# Patient Record
Sex: Male | Born: 1935 | Race: White | Hispanic: No | Marital: Married | Smoking: Former smoker
Health system: Southern US, Community
[De-identification: ages and names within clinical notes are randomized; demographics above are authoritative.]

## PROBLEM LIST (undated history)

## (undated) DIAGNOSIS — M1990 Unspecified osteoarthritis, unspecified site: Secondary | ICD-10-CM

## (undated) DIAGNOSIS — I2119 ST elevation (STEMI) myocardial infarction involving other coronary artery of inferior wall: Secondary | ICD-10-CM

## (undated) DIAGNOSIS — E119 Type 2 diabetes mellitus without complications: Secondary | ICD-10-CM

## (undated) DIAGNOSIS — N179 Acute kidney failure, unspecified: Secondary | ICD-10-CM

## (undated) DIAGNOSIS — I714 Abdominal aortic aneurysm, without rupture, unspecified: Secondary | ICD-10-CM

## (undated) DIAGNOSIS — N200 Calculus of kidney: Secondary | ICD-10-CM

## (undated) DIAGNOSIS — J45909 Unspecified asthma, uncomplicated: Secondary | ICD-10-CM

## (undated) DIAGNOSIS — E785 Hyperlipidemia, unspecified: Secondary | ICD-10-CM

## (undated) DIAGNOSIS — F419 Anxiety disorder, unspecified: Secondary | ICD-10-CM

## (undated) DIAGNOSIS — I10 Essential (primary) hypertension: Secondary | ICD-10-CM

## (undated) DIAGNOSIS — N401 Enlarged prostate with lower urinary tract symptoms: Secondary | ICD-10-CM

## (undated) DIAGNOSIS — I619 Nontraumatic intracerebral hemorrhage, unspecified: Secondary | ICD-10-CM

## (undated) DIAGNOSIS — R351 Nocturia: Secondary | ICD-10-CM

## (undated) DIAGNOSIS — I209 Angina pectoris, unspecified: Secondary | ICD-10-CM

## (undated) DIAGNOSIS — I5022 Chronic systolic (congestive) heart failure: Secondary | ICD-10-CM

## (undated) DIAGNOSIS — I639 Cerebral infarction, unspecified: Secondary | ICD-10-CM

## (undated) DIAGNOSIS — R001 Bradycardia, unspecified: Secondary | ICD-10-CM

## (undated) DIAGNOSIS — I2510 Atherosclerotic heart disease of native coronary artery without angina pectoris: Secondary | ICD-10-CM

## (undated) DIAGNOSIS — H9190 Unspecified hearing loss, unspecified ear: Secondary | ICD-10-CM

## (undated) DIAGNOSIS — I480 Paroxysmal atrial fibrillation: Secondary | ICD-10-CM

## (undated) DIAGNOSIS — Z974 Presence of external hearing-aid: Secondary | ICD-10-CM

## (undated) HISTORY — PX: VASECTOMY: SHX75

## (undated) HISTORY — PX: LITHOTRIPSY: SUR834

---
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
## 1991-05-10 HISTORY — PX: INTERTROCHANTERIC HIP FRACTURE SURGERY: SHX681

## 2004-03-11 ENCOUNTER — Encounter: Payer: Self-pay | Admitting: Unknown Physician Specialty

## 2004-03-18 ENCOUNTER — Ambulatory Visit: Payer: Self-pay | Admitting: Internal Medicine

## 2004-04-09 ENCOUNTER — Encounter: Payer: Self-pay | Admitting: Unknown Physician Specialty

## 2004-04-09 ENCOUNTER — Ambulatory Visit: Payer: Self-pay | Admitting: Internal Medicine

## 2004-05-09 ENCOUNTER — Encounter: Payer: Self-pay | Admitting: Unknown Physician Specialty

## 2004-06-04 ENCOUNTER — Ambulatory Visit: Payer: Self-pay | Admitting: Internal Medicine

## 2004-06-09 ENCOUNTER — Encounter: Payer: Self-pay | Admitting: Unknown Physician Specialty

## 2004-07-10 ENCOUNTER — Encounter: Payer: Self-pay | Admitting: Unknown Physician Specialty

## 2004-08-07 ENCOUNTER — Encounter: Payer: Self-pay | Admitting: Unknown Physician Specialty

## 2004-09-07 ENCOUNTER — Encounter: Payer: Self-pay | Admitting: Unknown Physician Specialty

## 2004-10-07 ENCOUNTER — Encounter: Payer: Self-pay | Admitting: Unknown Physician Specialty

## 2004-11-07 ENCOUNTER — Encounter: Payer: Self-pay | Admitting: Unknown Physician Specialty

## 2004-12-07 ENCOUNTER — Encounter: Payer: Self-pay | Admitting: Unknown Physician Specialty

## 2005-01-07 ENCOUNTER — Encounter: Payer: Self-pay | Admitting: Unknown Physician Specialty

## 2005-02-07 ENCOUNTER — Encounter: Payer: Self-pay | Admitting: Unknown Physician Specialty

## 2005-03-09 ENCOUNTER — Encounter: Payer: Self-pay | Admitting: Unknown Physician Specialty

## 2005-04-09 ENCOUNTER — Encounter: Payer: Self-pay | Admitting: Unknown Physician Specialty

## 2005-05-09 ENCOUNTER — Encounter: Payer: Self-pay | Admitting: Unknown Physician Specialty

## 2005-05-12 ENCOUNTER — Other Ambulatory Visit: Payer: Self-pay

## 2005-05-12 ENCOUNTER — Inpatient Hospital Stay: Payer: Self-pay | Admitting: Cardiology

## 2005-05-29 ENCOUNTER — Ambulatory Visit: Payer: Self-pay | Admitting: Internal Medicine

## 2005-06-09 ENCOUNTER — Encounter: Payer: Self-pay | Admitting: Unknown Physician Specialty

## 2005-07-10 ENCOUNTER — Encounter: Payer: Self-pay | Admitting: Unknown Physician Specialty

## 2005-08-07 ENCOUNTER — Encounter: Payer: Self-pay | Admitting: Unknown Physician Specialty

## 2005-09-07 ENCOUNTER — Encounter: Payer: Self-pay | Admitting: Unknown Physician Specialty

## 2005-10-07 ENCOUNTER — Encounter: Payer: Self-pay | Admitting: Unknown Physician Specialty

## 2005-11-07 ENCOUNTER — Encounter: Payer: Self-pay | Admitting: Unknown Physician Specialty

## 2005-12-07 ENCOUNTER — Encounter: Payer: Self-pay | Admitting: Unknown Physician Specialty

## 2006-01-07 ENCOUNTER — Encounter: Payer: Self-pay | Admitting: Unknown Physician Specialty

## 2006-02-07 ENCOUNTER — Encounter: Payer: Self-pay | Admitting: Unknown Physician Specialty

## 2006-03-09 ENCOUNTER — Encounter: Payer: Self-pay | Admitting: Unknown Physician Specialty

## 2006-04-09 ENCOUNTER — Encounter: Payer: Self-pay | Admitting: Unknown Physician Specialty

## 2006-12-30 ENCOUNTER — Ambulatory Visit: Payer: Self-pay | Admitting: Internal Medicine

## 2007-06-10 HISTORY — PX: CORONARY ANGIOPLASTY WITH STENT PLACEMENT: SHX49

## 2007-07-22 ENCOUNTER — Ambulatory Visit: Payer: Self-pay | Admitting: Otolaryngology

## 2007-12-03 ENCOUNTER — Ambulatory Visit: Payer: Self-pay | Admitting: Cardiology

## 2008-04-18 ENCOUNTER — Ambulatory Visit: Payer: Self-pay | Admitting: Gastroenterology

## 2008-09-07 DIAGNOSIS — I619 Nontraumatic intracerebral hemorrhage, unspecified: Secondary | ICD-10-CM

## 2008-09-07 HISTORY — DX: Nontraumatic intracerebral hemorrhage, unspecified: I61.9

## 2008-09-22 ENCOUNTER — Ambulatory Visit: Payer: Self-pay | Admitting: Internal Medicine

## 2008-09-22 ENCOUNTER — Emergency Department: Payer: Self-pay | Admitting: Emergency Medicine

## 2008-12-07 ENCOUNTER — Ambulatory Visit: Payer: Self-pay | Admitting: Internal Medicine

## 2009-07-02 ENCOUNTER — Ambulatory Visit: Payer: Self-pay | Admitting: Cardiology

## 2010-06-17 ENCOUNTER — Ambulatory Visit: Payer: Self-pay | Admitting: Cardiology

## 2011-01-24 ENCOUNTER — Ambulatory Visit: Payer: Self-pay | Admitting: Urology

## 2011-02-13 ENCOUNTER — Ambulatory Visit: Payer: Self-pay | Admitting: Urology

## 2011-02-17 ENCOUNTER — Ambulatory Visit: Payer: Self-pay | Admitting: Urology

## 2011-06-10 HISTORY — PX: ABDOMINAL AORTIC ANEURYSM REPAIR: SHX42

## 2011-07-04 ENCOUNTER — Ambulatory Visit: Payer: Self-pay | Admitting: Cardiology

## 2011-10-08 DIAGNOSIS — I2119 ST elevation (STEMI) myocardial infarction involving other coronary artery of inferior wall: Secondary | ICD-10-CM

## 2011-10-08 HISTORY — PX: CARDIAC CATHETERIZATION: SHX172

## 2011-10-08 HISTORY — DX: ST elevation (STEMI) myocardial infarction involving other coronary artery of inferior wall: I21.19

## 2011-10-19 ENCOUNTER — Emergency Department (HOSPITAL_COMMUNITY): Payer: Medicare HMO

## 2011-10-19 ENCOUNTER — Emergency Department (HOSPITAL_COMMUNITY)
Admission: EM | Admit: 2011-10-19 | Discharge: 2011-10-20 | Payer: Medicare HMO | Attending: Emergency Medicine | Admitting: Emergency Medicine

## 2011-10-19 ENCOUNTER — Encounter (HOSPITAL_COMMUNITY): Payer: Self-pay | Admitting: Emergency Medicine

## 2011-10-19 DIAGNOSIS — R1013 Epigastric pain: Secondary | ICD-10-CM | POA: Insufficient documentation

## 2011-10-19 DIAGNOSIS — E785 Hyperlipidemia, unspecified: Secondary | ICD-10-CM | POA: Insufficient documentation

## 2011-10-19 DIAGNOSIS — Z8673 Personal history of transient ischemic attack (TIA), and cerebral infarction without residual deficits: Secondary | ICD-10-CM | POA: Insufficient documentation

## 2011-10-19 DIAGNOSIS — I10 Essential (primary) hypertension: Secondary | ICD-10-CM | POA: Insufficient documentation

## 2011-10-19 DIAGNOSIS — R0789 Other chest pain: Secondary | ICD-10-CM | POA: Insufficient documentation

## 2011-10-19 DIAGNOSIS — I619 Nontraumatic intracerebral hemorrhage, unspecified: Secondary | ICD-10-CM | POA: Insufficient documentation

## 2011-10-19 DIAGNOSIS — K3189 Other diseases of stomach and duodenum: Secondary | ICD-10-CM | POA: Insufficient documentation

## 2011-10-19 DIAGNOSIS — I200 Unstable angina: Secondary | ICD-10-CM | POA: Insufficient documentation

## 2011-10-19 DIAGNOSIS — E119 Type 2 diabetes mellitus without complications: Secondary | ICD-10-CM | POA: Insufficient documentation

## 2011-10-19 HISTORY — DX: Hyperlipidemia, unspecified: E78.5

## 2011-10-19 HISTORY — DX: Essential (primary) hypertension: I10

## 2011-10-19 HISTORY — DX: Nontraumatic intracerebral hemorrhage, unspecified: I61.9

## 2011-10-19 LAB — POCT I-STAT, CHEM 8
BUN: 22 mg/dL (ref 6–23)
Potassium: 3.9 mEq/L (ref 3.5–5.1)
Sodium: 143 mEq/L (ref 135–145)
TCO2: 22 mmol/L (ref 0–100)

## 2011-10-19 MED ORDER — HEPARIN BOLUS VIA INFUSION
4000.0000 [IU] | Freq: Once | INTRAVENOUS | Status: AC
  Administered 2011-10-20: 4000 [IU] via INTRAVENOUS

## 2011-10-19 MED ORDER — NITROGLYCERIN IN D5W 200-5 MCG/ML-% IV SOLN
2.0000 ug/min | Freq: Once | INTRAVENOUS | Status: AC
  Administered 2011-10-19: 5 ug/min via INTRAVENOUS
  Filled 2011-10-19: qty 250

## 2011-10-19 MED ORDER — HEPARIN (PORCINE) IN NACL 100-0.45 UNIT/ML-% IJ SOLN
1250.0000 [IU]/h | INTRAMUSCULAR | Status: DC
  Administered 2011-10-20: 1250 [IU]/h via INTRAVENOUS
  Filled 2011-10-19 (×2): qty 250

## 2011-10-19 NOTE — ED Notes (Addendum)
Patient complaining of mid-sternal chest pain since this morning/afternoon; rated pain 8/10 on the numerical pain scale; describes as pressure/tightness.  Reports shortness of breath; denies other associated symptoms.  Upon EMS arrival, patient report possible STEMI on 12-lead; faxed to MD.  No official STEMI called at thsit point.  Patient does have right bundle branch block.  History includes 6 stent placements and hemorrhagic stroke.  Patient took 1 nitro at home; had nitro spray by EMS.  Patient does not taken aspirin due to his hemorrhagic stroke 18 months ago.

## 2011-10-19 NOTE — ED Notes (Signed)
Lab at bedside

## 2011-10-19 NOTE — ED Provider Notes (Signed)
History     CSN: [id number]  Arrival date & time 10/19/11  2325   First MD Initiated Contact with Patient 10/19/11 2335      Chief Complaint  Patient presents with  . Code STEMI     The history is provided by the patient and the EMS personnel.   the patient reports indigestion earlier in the day and then awakening this evening at approximately 9:30 PM with chest tightness across his anterior chest.  He denies radiation of the discomfort.  He denies shortness of breath nausea or diaphoresis.  He does have a significant coronary artery disease history with 6 stents in his heart per the patient.  As reported that his last heart catheterization was in 2009 at which 0.2 additional stents were placed.  His cardiologist is in [facility].  EMS was called to the house and when they picked the patient up he was having active chest pain.  The patient taken nitroglycerin at home he was given aspirin nitroglycerin and nitro paste in route.  His EKG for EMS was concerning for possible inferior ST elevation MI and is probably EKG was transmitted to [facility] emergency department for evaluation.  At that time there was poor baseline and it was deemed to evaluate the patient emergently at the bedside with a repeat EKG.  The patient's pain is down to a 4/10 at this time he reports he feels much better.  He does report this feels similar to his prior anginal pain.  He has no fevers or chills.  He's had no cough or congestion.  He's had no unilateral leg swelling.  He has no prior history of DVT or PE.  He does report a history of hemorrhagic stroke.  His history of hypertension diabetes hyperlipidemia and coronary artery disease status post PCI.  Nothing worsens the symptoms.  Nothing improves his symptoms.  Symptoms are constant but currently improving  Past Medical History  Diagnosis Date  . Hemorrhagic stroke   . Diabetes mellitus   . Hypertension   . Hyperlipidemia     Past  Surgical History  Procedure Date  . Coronary stent placement     History reviewed. No pertinent family history.  History  Substance Use Topics  . Smoking status: Never Smoker   . Smokeless tobacco: Not on file  . Alcohol Use: Yes      Review of Systems  All other systems reviewed and are negative.    Allergies  Aspirin; Contrast media; and Decongestant  Home Medications  No current outpatient prescriptions on file.  BP 149/77  Pulse 61  Temp(Src) 97.5 F (36.4 C) (Oral)  Resp 13  SpO2 96%  Physical Exam  Nursing note and vitals reviewed. Constitutional: He is oriented to person, place, and time. He appears well-developed and well-nourished.  HENT:  Head: Normocephalic and atraumatic.  Eyes: EOM are normal.  Neck: Normal range of motion.  Cardiovascular: Normal rate, regular rhythm, normal heart sounds and intact distal pulses.   Pulmonary/Chest: Effort normal and breath sounds normal. No respiratory distress.  Abdominal: Soft. He exhibits no distension. There is no tenderness.  Musculoskeletal: Normal range of motion.  Neurological: He is alert and oriented to person, place, and time.  Skin: Skin is warm and dry.  Psychiatric: He has a normal mood and affect. Judgment normal.    ED Course  Procedures (including critical care time)   Date: 10/19/2011  Rate: 61  Rhythm: normal sinus rhythm  QRS Axis:  normal  Intervals: normal  ST/T Wave abnormalities: Nonspecific anterior ST and T wave changes  Conduction Disutrbances: RBBB  Narrative Interpretation:   Old EKG Reviewed: No prior EKG available   CRITICAL CARE Performed by: Lyanne Co Total critical care time: 35 Critical care time was exclusive of separately billable procedures and treating other patients. Critical care was necessary to treat or prevent imminent or life-threatening deterioration. Critical care was time spent personally by me on the following activities: development of treatment  plan with patient and/or surrogate as well as nursing, discussions with consultants, evaluation of patient's response to treatment, examination of patient, obtaining history from patient or surrogate, ordering and performing treatments and interventions, ordering and review of laboratory studies, ordering and review of radiographic studies, pulse oximetry and re-evaluation of patient's condition.   Labs Reviewed  CBC - Abnormal; Notable for the following:    RBC 4.18 (*)    MCV 101.9 (*)    MCH 35.9 (*)    Platelets 111 (*) PLATELET COUNT CONFIRMED BY SMEAR   All other components within normal limits  COMPREHENSIVE METABOLIC PANEL - Abnormal; Notable for the following:    Glucose, Bld 187 (*)    Total Bilirubin 0.2 (*)    GFR calc non Af Amer 77 (*)    GFR calc Af Amer 89 (*)    All other components within normal limits  POCT I-STAT, CHEM 8 - Abnormal; Notable for the following:    Glucose, Bld 194 (*)    All other components within normal limits  PROTIME-INR  TROPONIN I  POCT I-STAT TROPONIN I  HEPARIN LEVEL (UNFRACTIONATED)   Dg Chest Portable 1 View  10/20/2011  *RADIOLOGY REPORT*  Clinical Data: Chest pain, shortness of breath, code STEMI  PORTABLE CHEST - 1 VIEW  Comparison: None.  Findings: Shallow inspiration with elevation of left hemidiaphragm. Mild cardiac enlargement with normal pulmonary vascularity. Fibrosis or linear atelectasis in the lung bases.  Probable emphysematous changes in the apices.  No focal airspace consolidation.  No blunting of costophrenic angles.  No pneumothorax.  Visualized ribs appear intact.  IMPRESSION: Emphysematous changes in the lungs.  Shallow inspiration with linear atelectasis or fibrosis in the lung bases.  No evidence of active pulmonary disease.  Original Report Authenticated By: Marlon Pel, M.D.     1. Unstable angina       MDM  Historians concerning for unstable angina.  His EKG on arrival to the emergency department  demonstrates no ST elevation MI.  He has nonspecific T wave changes in his anterior leads.  Currently his pain is reduced to 1/10 on nitroglycerin and heparin drips.  I discussed this case with Dr. Bethanie Dicker MD the cardiologist at [facility] clinic who is currently on call for the patient's cardiologist who agrees that the patient needs to be admitted to [facility].  He asked that the patient be admitted to the hospitalists service.  I discussed the case with the hospitalist Dr. Drucie Opitz who accepts the patient in transfer.  The patient is stable for transfer at this time.        Lyanne Co, MD 10/20/11 [phone]

## 2011-10-19 NOTE — ED Notes (Signed)
X-ray at bedside; pharmacy called for patient's height and weight for heparin dosing.

## 2011-10-19 NOTE — ED Notes (Signed)
Registration at bedside.

## 2011-10-19 NOTE — Progress Notes (Signed)
ANTICOAGULATION CONSULT NOTE - Initial Consult  Pharmacy Consult for heparin   Indication: chest pain/ACS  Allergies  Allergen Reactions  . Aspirin     Hemorrhagic Stroke  . Contrast Media (Iodinated Diagnostic Agents)   . Decongestant (Pseudoephedrine Hcl)     Patient Measurements: Height: 6\' 3"  (190.5 cm) Weight: 215 lb (97.523 kg) IBW/kg (Calculated) : 84.5  Heparin Dosing Weight:   Vital Signs: Temp: 97.5 F (36.4 C) (05/12 2335) Temp src: Oral (05/12 2335) BP: 149/77 mmHg (05/12 2335) Pulse Rate: 61  (05/12 2335)  Labs: No results found for this basename: HGB:2,HCT:3,PLT:3,APTT:3,LABPROT:3,INR:3,HEPARINUNFRC:3,CREATININE:3,CKTOTAL:3,CKMB:3,TROPONINI:3 in the last 72 hours  CrCl is unknown because no creatinine reading has been taken.   Medical History: Past Medical History  Diagnosis Date  . Hemorrhagic stroke   . Diabetes mellitus   . Hypertension   . Hyperlipidemia     Medications:  No anticoagulants in pending med rec  Assessment:       Chest pain/acs last cath 2009 with 2 stents placed. Appeared as inferior st elevation with ems ekg. Chest pain constant but improving.  Goal of Therapy:  Heparin level 0.3-0.7 units/ml Monitor platelets by anticoagulation protocol: Yes   Plan:  Heparin 4000 units x then 1250 units/hr. Heparin level in 8 hours Daily HL and cbc starting 5/13 am labs   Janice Coffin 10/19/2011,11:51 PM

## 2011-10-19 NOTE — ED Notes (Addendum)
Patient arrival to room; Dr. Patria Mane, multiple RNs, and EMTs at bedside.  Patient immediately placed on continuous cardiac, pulse ox, and blood pressure monitor.  Placed on Zoll.

## 2011-10-20 ENCOUNTER — Inpatient Hospital Stay: Payer: Self-pay | Admitting: Internal Medicine

## 2011-10-20 LAB — POCT I-STAT TROPONIN I: Troponin i, poc: 0.03 ng/mL (ref 0.00–0.08)

## 2011-10-20 LAB — BASIC METABOLIC PANEL
BUN: 17 mg/dL (ref 7–18)
Calcium, Total: 8.8 mg/dL (ref 8.5–10.1)
Chloride: 111 mmol/L — ABNORMAL HIGH (ref 98–107)
EGFR (African American): >60
Glucose: 131 mg/dL — ABNORMAL HIGH (ref 65–99)
Sodium: 142 mmol/L (ref 136–145)

## 2011-10-20 LAB — CBC WITH DIFFERENTIAL/PLATELET
Basophil %: 0.4 %
Eosinophil #: 0 10*3/uL (ref 0.0–0.7)
Eosinophil %: 0.1 %
HCT: 43.8 % (ref 40.0–52.0)
MCH: 36.1 pg — ABNORMAL HIGH (ref 26.0–34.0)
MCV: 105 fL — ABNORMAL HIGH (ref 80–100)
Monocyte #: 0.1 x10 3/mm — ABNORMAL LOW (ref 0.2–1.0)
Monocyte %: 1.1 %
Neutrophil %: 92.7 %
Platelet: 108 10*3/uL — ABNORMAL LOW (ref 150–440)
RBC: 4.18 10*6/uL — ABNORMAL LOW (ref 4.40–5.90)
RDW: 13.1 % (ref 11.5–14.5)
WBC: 10 10*3/uL (ref 3.8–10.6)

## 2011-10-20 LAB — CBC
MCH: 35.9 pg — ABNORMAL HIGH (ref 26.0–34.0)
MCHC: 35.2 g/dL (ref 30.0–36.0)
Platelets: 111 10*3/uL — ABNORMAL LOW (ref 150–400)
RDW: 13.1 % (ref 11.5–15.5)

## 2011-10-20 LAB — COMPREHENSIVE METABOLIC PANEL
ALT: 13 U/L (ref 0–53)
AST: 21 U/L (ref 0–37)
Albumin: 3.7 g/dL (ref 3.5–5.2)
Calcium: 9.7 mg/dL (ref 8.4–10.5)
GFR calc Af Amer: 89 mL/min — ABNORMAL LOW (ref >90)
Glucose, Bld: 187 mg/dL — ABNORMAL HIGH (ref 70–99)
Sodium: 139 mEq/L (ref 135–145)
Total Protein: 6.7 g/dL (ref 6.0–8.3)

## 2011-10-20 LAB — CK TOTAL AND CKMB (NOT AT ARMC)
CK, Total: 284 U/L — ABNORMAL HIGH (ref 35–232)
CK-MB: 26.7 ng/mL — ABNORMAL HIGH (ref 0.5–3.6)
CK-MB: 47.8 ng/mL — ABNORMAL HIGH (ref 0.5–3.6)

## 2011-10-20 LAB — APTT: Activated PTT: 29.3 secs (ref 23.6–35.9)

## 2011-10-20 LAB — TROPONIN I
Troponin I: <0.3 ng/mL (ref <0.30)
Troponin-I: 10.45 ng/mL — ABNORMAL HIGH

## 2011-10-20 NOTE — ED Notes (Signed)
Patient currently resting quietly in bed; no respiratory or acute distress noted.  Patient updated on plan of care; informed patient that he is going to be transferred to [facility] (per Dr. Patria Mane) due to patient's Cardiologist request.   [facility] to call [facility] when room is assigned.  Patient has no other questions or concerns at this time; will continue to monitor.

## 2011-10-20 NOTE — ED Notes (Signed)
Report given to [facility]; ETA 5 minutes.

## 2011-10-20 NOTE — ED Notes (Signed)
Heparin dose verified by Ian Malkin, RN.

## 2011-10-20 NOTE — ED Notes (Signed)
[facility] here to transport patient to [facility] (room 230).

## 2011-10-20 NOTE — ED Notes (Signed)
Patient currently resting quietly in bed; no respiratory or acute distress noted.  Patient updated on plan of care; informed patient that EDP has made consult to admit.  Patient has no other questions or concerns at this time; will continue to monitor.

## 2011-10-20 NOTE — ED Notes (Signed)
Patient concerned about taking Heparin due to his past history of hemorrhagic stroke; Dr. Patria Mane at bedside explaining the risks/benefits.

## 2011-10-20 NOTE — ED Notes (Signed)
Report given to RN at [facility].  No further questions/concerns from RN.  [facility] called to transport patient; preparing patient for transport.

## 2011-10-20 NOTE — ED Notes (Signed)
Patient transported to [facility] by [employer].

## 2011-10-20 NOTE — ED Notes (Signed)
Called [facility] to ask if room is ready; patient going to room 230.  Calling report now.

## 2011-10-20 NOTE — ED Notes (Signed)
Removed nitro paste applied by EMS before administering nitro drip.

## 2011-10-21 LAB — CBC WITH DIFFERENTIAL/PLATELET
Basophil #: 0 10*3/uL (ref 0.0–0.1)
Eosinophil #: 0 10*3/uL (ref 0.0–0.7)
Eosinophil %: 0 %
HCT: 43.8 % (ref 40.0–52.0)
MCHC: 34.5 g/dL (ref 32.0–36.0)
Monocyte #: 0.2 x10 3/mm (ref 0.2–1.0)
Monocyte %: 1.9 %
Platelet: 107 10*3/uL — ABNORMAL LOW (ref 150–440)
RBC: 4.18 10*6/uL — ABNORMAL LOW (ref 4.40–5.90)
RDW: 13.3 % (ref 11.5–14.5)
WBC: 8.4 10*3/uL (ref 3.8–10.6)

## 2011-10-21 LAB — APTT: Activated PTT: 65.2 secs — ABNORMAL HIGH (ref 23.6–35.9)

## 2011-10-22 ENCOUNTER — Inpatient Hospital Stay (HOSPITAL_COMMUNITY): Payer: Medicare HMO

## 2011-10-22 ENCOUNTER — Inpatient Hospital Stay (HOSPITAL_COMMUNITY)
Admission: RE | Admit: 2011-10-22 | Discharge: 2011-11-03 | DRG: 236 | Disposition: A | Discharge status: Skilled Nursing Facility | Payer: Medicare HMO | Source: Other Acute Inpatient Hospital | Attending: Cardiothoracic Surgery | Admitting: Cardiothoracic Surgery

## 2011-10-22 ENCOUNTER — Encounter (HOSPITAL_COMMUNITY): Payer: Self-pay | Admitting: General Practice

## 2011-10-22 ENCOUNTER — Encounter (HOSPITAL_COMMUNITY): Payer: Self-pay | Admitting: Respiratory Therapy

## 2011-10-22 ENCOUNTER — Other Ambulatory Visit: Payer: Self-pay

## 2011-10-22 DIAGNOSIS — I214 Non-ST elevation (NSTEMI) myocardial infarction: Principal | ICD-10-CM | POA: Diagnosis present

## 2011-10-22 DIAGNOSIS — Y849 Medical procedure, unspecified as the cause of abnormal reaction of the patient, or of later complication, without mention of misadventure at the time of the procedure: Secondary | ICD-10-CM | POA: Diagnosis present

## 2011-10-22 DIAGNOSIS — E119 Type 2 diabetes mellitus without complications: Secondary | ICD-10-CM | POA: Diagnosis present

## 2011-10-22 DIAGNOSIS — I4891 Unspecified atrial fibrillation: Secondary | ICD-10-CM | POA: Diagnosis present

## 2011-10-22 DIAGNOSIS — Z8679 Personal history of other diseases of the circulatory system: Secondary | ICD-10-CM

## 2011-10-22 DIAGNOSIS — I2510 Atherosclerotic heart disease of native coronary artery without angina pectoris: Secondary | ICD-10-CM

## 2011-10-22 DIAGNOSIS — I252 Old myocardial infarction: Secondary | ICD-10-CM

## 2011-10-22 DIAGNOSIS — J9819 Other pulmonary collapse: Secondary | ICD-10-CM | POA: Diagnosis present

## 2011-10-22 DIAGNOSIS — Z950 Presence of cardiac pacemaker: Secondary | ICD-10-CM

## 2011-10-22 DIAGNOSIS — D62 Acute posthemorrhagic anemia: Secondary | ICD-10-CM | POA: Diagnosis not present

## 2011-10-22 DIAGNOSIS — I10 Essential (primary) hypertension: Secondary | ICD-10-CM | POA: Diagnosis present

## 2011-10-22 DIAGNOSIS — Z8673 Personal history of transient ischemic attack (TIA), and cerebral infarction without residual deficits: Secondary | ICD-10-CM

## 2011-10-22 DIAGNOSIS — R5381 Other malaise: Secondary | ICD-10-CM | POA: Diagnosis present

## 2011-10-22 DIAGNOSIS — E785 Hyperlipidemia, unspecified: Secondary | ICD-10-CM | POA: Diagnosis present

## 2011-10-22 DIAGNOSIS — Z87891 Personal history of nicotine dependence: Secondary | ICD-10-CM

## 2011-10-22 DIAGNOSIS — D72829 Elevated white blood cell count, unspecified: Secondary | ICD-10-CM | POA: Diagnosis present

## 2011-10-22 DIAGNOSIS — I714 Abdominal aortic aneurysm, without rupture: Secondary | ICD-10-CM

## 2011-10-22 DIAGNOSIS — T82897A Other specified complication of cardiac prosthetic devices, implants and grafts, initial encounter: Secondary | ICD-10-CM | POA: Diagnosis present

## 2011-10-22 DIAGNOSIS — R197 Diarrhea, unspecified: Secondary | ICD-10-CM | POA: Diagnosis present

## 2011-10-22 DIAGNOSIS — Z951 Presence of aortocoronary bypass graft: Secondary | ICD-10-CM

## 2011-10-22 HISTORY — DX: Calculus of kidney: N20.0

## 2011-10-22 HISTORY — DX: Angina pectoris, unspecified: I20.9

## 2011-10-22 HISTORY — DX: Benign prostatic hyperplasia with lower urinary tract symptoms: N40.1

## 2011-10-22 HISTORY — DX: Unspecified osteoarthritis, unspecified site: M19.90

## 2011-10-22 HISTORY — DX: ST elevation (STEMI) myocardial infarction involving other coronary artery of inferior wall: I21.19

## 2011-10-22 HISTORY — DX: Type 2 diabetes mellitus without complications: E11.9

## 2011-10-22 HISTORY — DX: Atherosclerotic heart disease of native coronary artery without angina pectoris: I25.10

## 2011-10-22 HISTORY — DX: Anxiety disorder, unspecified: F41.9

## 2011-10-22 HISTORY — DX: Cerebral infarction, unspecified: I63.9

## 2011-10-22 HISTORY — DX: Nocturia: R35.1

## 2011-10-22 LAB — CBC WITH DIFFERENTIAL/PLATELET
Basophil #: 0 10*3/uL (ref 0.0–0.1)
Eosinophil #: 0.1 10*3/uL (ref 0.0–0.7)
HCT: 40.1 % (ref 40.0–52.0)
Lymphocyte %: 11.9 %
MCH: 36.3 pg — ABNORMAL HIGH (ref 26.0–34.0)
MCHC: 34.4 g/dL (ref 32.0–36.0)
Monocyte %: 8 %
Neutrophil #: 7.9 10*3/uL — ABNORMAL HIGH (ref 1.4–6.5)
Neutrophil %: 78.5 %
Platelet: 93 10*3/uL — ABNORMAL LOW (ref 150–440)
WBC: 10.1 10*3/uL (ref 3.8–10.6)

## 2011-10-22 LAB — CBC
HCT: 40 % (ref 39.0–52.0)
Hemoglobin: 13.8 g/dL (ref 13.0–17.0)
MCH: 35.7 pg — ABNORMAL HIGH (ref 26.0–34.0)
MCHC: 34.5 g/dL (ref 30.0–36.0)
RBC: 3.87 MIL/uL — ABNORMAL LOW (ref 4.22–5.81)

## 2011-10-22 LAB — BASIC METABOLIC PANEL
BUN: 14 mg/dL (ref 6–23)
CO2: 24 mEq/L (ref 19–32)
Calcium: 8.8 mg/dL (ref 8.4–10.5)
GFR calc non Af Amer: 75 mL/min — ABNORMAL LOW (ref >90)
Glucose, Bld: 188 mg/dL — ABNORMAL HIGH (ref 70–99)
Potassium: 3.6 mEq/L (ref 3.5–5.1)
Sodium: 139 mEq/L (ref 135–145)

## 2011-10-22 LAB — URINALYSIS, ROUTINE W REFLEX MICROSCOPIC
Bilirubin Urine: NEGATIVE
Glucose, UA: NEGATIVE mg/dL
Hgb urine dipstick: NEGATIVE
Specific Gravity, Urine: 1.013 (ref 1.005–1.030)
Urobilinogen, UA: 0.2 mg/dL (ref 0.0–1.0)

## 2011-10-22 LAB — PROTIME-INR
INR: 1.14 (ref 0.00–1.49)
Prothrombin Time: 14.8 seconds (ref 11.6–15.2)

## 2011-10-22 LAB — APTT
Activated PTT: 109.6 secs — ABNORMAL HIGH (ref 23.6–35.9)
aPTT: 96 seconds — ABNORMAL HIGH (ref 24–37)

## 2011-10-22 LAB — ABO/RH: ABO/RH(D): A NEG

## 2011-10-22 LAB — GLUCOSE, CAPILLARY: Glucose-Capillary: 124 mg/dL — ABNORMAL HIGH (ref 70–99)

## 2011-10-22 LAB — HEPARIN LEVEL (UNFRACTIONATED): Heparin Unfractionated: 0.43 IU/mL (ref 0.30–0.70)

## 2011-10-22 LAB — PREPARE RBC (CROSSMATCH)

## 2011-10-22 MED ORDER — HEPARIN (PORCINE) IN NACL 100-0.45 UNIT/ML-% IJ SOLN
1000.0000 [IU]/h | INTRAMUSCULAR | Status: DC
  Administered 2011-10-22: 1000 [IU]/h via INTRAVENOUS
  Filled 2011-10-22 (×2): qty 250

## 2011-10-22 MED ORDER — INSULIN ASPART 100 UNIT/ML ~~LOC~~ SOLN: 0.0000 [IU] | Freq: Three times a day (TID) | SUBCUTANEOUS | Status: DC

## 2011-10-22 MED ORDER — MORPHINE SULFATE 2 MG/ML IJ SOLN: 1.0000 mg | INTRAMUSCULAR | Status: DC | PRN

## 2011-10-22 MED ORDER — TEMAZEPAM 15 MG PO CAPS
15.0000 mg | ORAL_CAPSULE | Freq: Once | ORAL | Status: AC | PRN
Start: 2011-10-23 — End: 2011-10-23

## 2011-10-22 MED ORDER — FINASTERIDE 5 MG PO TABS
5.0000 mg | ORAL_TABLET | Freq: Every day | ORAL | Status: DC
  Administered 2011-10-22 – 2011-10-23 (×2): 5 mg via ORAL
  Filled 2011-10-22 (×3): qty 1

## 2011-10-22 MED ORDER — ADULT MULTIVITAMIN W/MINERALS CH
1.0000 | ORAL_TABLET | Freq: Every day | ORAL | Status: DC
  Administered 2011-10-22 – 2011-11-03 (×12): 1 via ORAL
  Filled 2011-10-22 (×13): qty 1

## 2011-10-22 MED ORDER — OXYCODONE HCL 5 MG PO TABS: 5.0000 mg | ORAL_TABLET | ORAL | Status: DC | PRN

## 2011-10-22 MED ORDER — LORAZEPAM 0.5 MG PO TABS: 0.5000 mg | ORAL_TABLET | ORAL | Status: DC | PRN

## 2011-10-22 MED ORDER — ROSUVASTATIN CALCIUM 5 MG PO TABS
2.5000 mg | ORAL_TABLET | ORAL | Status: DC
  Administered 2011-10-22 – 2011-10-31 (×4): 2.5 mg via ORAL
  Filled 2011-10-22 (×6): qty 0.5

## 2011-10-22 MED ORDER — ATENOLOL 50 MG PO TABS
50.0000 mg | ORAL_TABLET | Freq: Every day | ORAL | Status: DC
  Administered 2011-10-22 – 2011-10-23 (×2): 50 mg via ORAL
  Filled 2011-10-22 (×3): qty 1

## 2011-10-22 MED ORDER — HEPARIN BOLUS VIA INFUSION
4000.0000 [IU] | Freq: Once | INTRAVENOUS | Status: DC
  Filled 2011-10-22: qty 4000

## 2011-10-22 MED ORDER — LISINOPRIL 20 MG PO TABS
20.0000 mg | ORAL_TABLET | Freq: Every day | ORAL | Status: DC
  Administered 2011-10-22 – 2011-10-23 (×2): 20 mg via ORAL
  Filled 2011-10-22 (×3): qty 1

## 2011-10-22 MED ORDER — METOPROLOL TARTRATE 12.5 MG HALF TABLET
12.5000 mg | ORAL_TABLET | Freq: Once | ORAL | Status: DC
  Filled 2011-10-22 (×2): qty 1

## 2011-10-22 MED ORDER — KCL IN DEXTROSE-NACL 20-5-0.45 MEQ/L-%-% IV SOLN
INTRAVENOUS | Status: DC
  Administered 2011-10-22: 20:00:00 via INTRAVENOUS
  Filled 2011-10-22 (×2): qty 1000

## 2011-10-22 MED ORDER — METFORMIN HCL 500 MG PO TABS
500.0000 mg | ORAL_TABLET | Freq: Every day | ORAL | Status: DC
  Filled 2011-10-22 (×3): qty 1

## 2011-10-22 MED ORDER — BISACODYL 5 MG PO TBEC
5.0000 mg | DELAYED_RELEASE_TABLET | Freq: Once | ORAL | Status: AC
  Administered 2011-10-23: 5 mg via ORAL
  Filled 2011-10-22: qty 1

## 2011-10-22 MED ORDER — CHLORHEXIDINE GLUCONATE 4 % EX LIQD
60.0000 mL | Freq: Once | CUTANEOUS | Status: AC
  Administered 2011-10-24: 4 via TOPICAL
  Filled 2011-10-22: qty 60

## 2011-10-22 NOTE — H&P (Signed)
Patient examined and medical record reviewed. Outside cardiac catheterization and coronary arteriogram is reviewed. Agree with the above note. Patient will be prepared for multivessel bypass grafting on May 17.

## 2011-10-22 NOTE — H&P (Signed)
[address]            [place] [address]          [phone]           History & Physical  Name: Nathaniel Woods DOB: 12-27-1928 78 y.o. MRN: [id number]   Referring cardiologist:  Jamse Mead, MD Primary MD: Alonna Buckler, MD   Chief Complaint: NSTEMI   HPI: Nathaniel Woods is a 78 y.o. male admitted in transfer from [facility] with severe three-vessel coronary artery disease after recent NSTEMI. The patient has a long history of coronary artery disease dating back to 2. He has undergone stent placements in the mid and to late 90s at [facility] to the RCA, mid LAD, and second diagonal, and more recently a Cypher stent to the distal right coronary artery in [place] in 2006.  He was in his usual state of health until approximately 3 weeks ago, when he began to experience chest discomfort while walking on the treadmill. This was relieved with rest. His symptoms occurred intermittently with exertion but were always relieved with rest thereafter. He denied associated nausea, vomiting, diaphoresis, or shortness of breath. On the evening of 10/19/2011, the patient developed chest discomfort at rest while at home. He went to bed early but continued to have pain throughout the evening. His symptoms escalated and ultimately his wife contacted EMS. He was initially brought to [facility] ED for evaluation, but was transferred to [facility] at the patient's request since his cardiologist practice there. He was admitted for further evaluation, and ultimately ruled in for an acute non-ST segment elevation myocardial infarction, with a troponin-I of 3.20.   He underwent cardiac catheterization on 10/20/2011 by Dr. Darrold Junker, which revealed a diffuse 90% in-stent stenosis of the mid LAD . He also had a 60% stenosis of the first diagonal at the site of the prior stent, a 75% stenosis of the first obtuse marginal, and a totally occluded mid right  coronary artery. Left ventricular ejection fraction was preserved at 52%. Because of his severe 3 vessel disease and previous history of multiple stents, it was felt that a cardiac surgery consultation was indicated. The patient was subsequently transferred to [facility] under the care of Dr. Kathlee Nations Trigt for further evaluation and treatment. Of note, since the patient's hospitalization, he has remained pain-free.       Past Medical History  Diagnosis Date  . Hemorrhagic stroke   . Diabetes mellitus   . Hypertension   . Hyperlipidemia    Anterior MI- 1995  Coronary artery disease  History of subdural hematoma, status post fall  Abdominal aortic aneurysm (4.6 cm by ultrasound 06/2011), previously seen [facility] in [facility]  Colon polyps  Diverticulosis  Right rotator cuff injury  Kidney stones  History of V-fib arrest in 1995     Surgical History: Past Surgical History  Procedure Date  . Coronary stent placement    Repair of right hip fracture, s/p fall   Cystoscopy for kidney stones   Social History: Married, resides in [place] with his wife, who has had 2 previous strokes.  3 children.  Smoked 1 ppd cigarettes x at least 30 years, quit around 40 years ago.  Drinks 1-2 alcoholic beverages per day.  Family History: Both parents deceased at ages 20 and 63 of natural causes.  1 sister  with a history of breast cancer.  No history of cardiac/pulmonary disease, cancer, CVA, hypertension or diabetes of which he is aware.    Allergies  Allergen Reactions  . Aspirin     Hemorrhagic Stroke  . Contrast Media (Iodinated Diagnostic Agents)   . Decongestant (Pseudoephedrine Hcl)     Prior to Admission medications   Medication Sig Start Date End Date Taking? Authorizing Provider  atenolol (TENORMIN) 50 MG tablet Take 50 mg by mouth daily.    Historical Provider, MD  BIOTIN 5000 PO Take 1 tablet by mouth daily.    Historical Provider, MD    Coenzyme Q10 (COQ10) 50 MG CAPS Take 50 mg by mouth daily.     Historical Provider, MD  finasteride (PROSCAR) 5 MG tablet Take 5 mg by mouth daily.    Historical Provider, MD  lisinopril (PRINIVIL,ZESTRIL) 20 MG tablet Take 20 mg by mouth daily.    Historical Provider, MD  LUTEIN PO Take 1 tablet by mouth daily.    Historical Provider, MD  metFORMIN (GLUCOPHAGE) 500 MG tablet Take 500 mg by mouth daily with breakfast.    Historical Provider, MD  Multiple Vitamin (MULITIVITAMIN WITH MINERALS) TABS Take 1 tablet by mouth daily.    Historical Provider, MD  Omega-3 Fatty Acids (FISH OIL) 1200 MG CAPS Take 2 capsules by mouth daily.    Historical Provider, MD  Rosuvastatin Calcium (CRESTOR PO) Take 2.5 mg by mouth every Monday, Wednesday, and Friday.    Historical Provider, MD    Review of Systems: Constitutional: no fever, chills or weight loss.  HEENT: wears glasses and hearing aids.  No visual changes, amaurosis fugax, dysphagia, or hoarseness.  Pulmonary: + shortness of breath with heavy exertion, no orthopnea, PND, cough or wheezing.  Cardiac: as above. GI: No nausea, vomiting, diarrhea, constipation, reflux, melena.  GU: History of kidney stones, + urinary frequency and nocturia.  No dysuria or hematuria.  Endocrine: DM well controlled per patient. Musculoskeletal: No joint or muscle pain or weakness. Neuro: H/O hemorrhagic CVA and Subdural hematoma secondary to fall.  No residual deficits. Psychiatric: No anxiety or depression.    Physical Exam: Filed Vitals:   10/22/11 1500  BP: 133/81  Pulse: 55  Temp: 98 F (36.7 C)  Resp: 18    General: Elderly white male in no acute distress.  HEENT: Normocephalic, atraumatic. No external abnormalities of the eyes, ears or nose. Oropharynx clear.  Neck: Supple, no adenopathy or thyromegaly. No bruits. Heart: Regular rate and rhythm, no murmurs. Lungs: Clear to auscultation. Abdomen: Soft, nontender and nondistended, active bowel sounds in all  quadrants. No masses palpable. Extremities: No clubbing, cyanosis, or edema. Right groin cath site with small area of ecchymosis. Femoral, dorsalis pedis pulses palpable bilaterally. No varicosities noted. Neuro: Grossly intact.    Labs: Lab Results  Component Value Date   WBC 6.0 10/19/2011   HGB 15.3 10/19/2011   HCT 45.0 10/19/2011   MCV 101.9* 10/19/2011   PLT 111* 10/19/2011   Lab Results  Component Value Date   INR 0.91 10/19/2011      Component Value Date/Time   NA 143 10/19/2011 2357   K 3.9 10/19/2011 2357   CL 109 10/19/2011 2357   CO2 23 10/19/2011 2341   GLUCOSE 194* 10/19/2011 2357   BUN 22 10/19/2011 2357   CREATININE 1.00 10/19/2011 2357   CALCIUM 9.7 10/19/2011 2341   GFRNONAA 77* 10/19/2011 2341   GFRAA 89* 10/19/2011 2341    Imaging: Dg Chest  Portable 1 View  10/20/2011  *RADIOLOGY REPORT*  Clinical Data: Chest pain, shortness of breath, code STEMI  PORTABLE CHEST - 1 VIEW  Comparison: None.  Findings: Shallow inspiration with elevation of left hemidiaphragm. Mild cardiac enlargement with normal pulmonary vascularity. Fibrosis or linear atelectasis in the lung bases.  Probable emphysematous changes in the apices.  No focal airspace consolidation.  No blunting of costophrenic angles.  No pneumothorax.  Visualized ribs appear intact.  IMPRESSION: Emphysematous changes in the lungs.  Shallow inspiration with linear atelectasis or fibrosis in the lung bases.  No evidence of active pulmonary disease.  Original Report Authenticated By: Marlon Pel, M.D.     Assessment/Plan: This is an 78 year old male with a long history of coronary artery disease, status post multiple stents. He is now status post an acute myocardial infarction, and has severe three-vessel coronary artery disease including in-stent stenoses by catheterization. He is currently pain-free. Dr. Donata Clay will see the patient and review his films, and we anticipate proceeding with surgical revascularization  within the next 48 hours.    Adella Hare, PA 10/22/2011 3:44 PM

## 2011-10-22 NOTE — Progress Notes (Addendum)
ANTICOAGULATION CONSULT NOTE - Initial Consult  Pharmacy Consult for Heparin   Indication: s/p MI - severe 3VCAD    Allergies  Allergen Reactions  . Aspirin     Hemorrhagic Stroke  . Decongestant (Pseudoephedrine Hcl)     "don't remember"  . Contrast Media (Iodinated Diagnostic Agents) Rash    Patient Measurements: Height: 6\' 3"  (190.5 cm) Weight: 212 lb 15.4 oz (96.6 kg) IBW/kg (Calculated) : 84.5  Heparin Dosing Weight: 96.6  Vital Signs: Temp: 98 F (36.7 C) (05/15 1500) Temp src: Oral (05/15 1500) BP: 133/81 mmHg (05/15 1500) Pulse Rate: 55  (05/15 1500)  Labs:  Basename 10/19/11 2357 10/19/11 2341  HGB 15.3 15.0  HCT 45.0 42.6  PLT -- 111*  APTT -- --  LABPROT -- 12.5  INR -- 0.91  HEPARINUNFRC -- --  CREATININE 1.00 0.93  CKTOTAL -- --  CKMB -- --  TROPONINI -- <0.30    Estimated Creatinine Clearance: 69.2 ml/min (by C-G formula based on Cr of 1).   Medical History: Past Medical History  Diagnosis Date  . Hemorrhagic stroke 09/2008    S/P fall  . Hypertension   . Hyperlipidemia   . Kidney stone   . Stroke   . Coronary artery disease   . Angina   . Inferior MI 10/2011  . Type II diabetes mellitus   . BPH associated with nocturia   . Arthritis   . Anxiety     "just before major surgery"    Medications:  Atenolol 50 mg daily Biotin 5000 1 tablet daily Coenzyme Q10 50mg  - 1 capsule daily Finasteride 5mg  daily Lisinopril 20mg  daily Lutein 1 tablet daily Metformin 500mg  daily MVI 1 tablet daily Omega - 3 1200mg  - 2 capsules daily Rosuvastatin 2.5mg  MWF  Assessment:  78 yo seen in [facility] ED on 5/12 with complaints of chest pain.  He was transferred to [facility] to be seen by his personal cardiologist there.  He underwent cardiac cath which reveals 3V CAD and was transferred here to undergo a  TCTS consult.  He is to be started on IV heparin while awaiting the consult and possible surgery.  He has history that is significant for AAA, and  subdural hematoma after a fall.  Goal of Therapy:  Heparin level 0.3-0.7 units/ml Monitor platelets by anticoagulation protocol: Yes   Plan:   Continue IV Heparin at 980 units/hr - will check level and convert to our concentration with results.  Heparin level now  Daily HL and cbc starting 5/13 am labs  Nadara Mustard, PharmD., MS Clinical Pharmacist Pager:  [phone] Thank you for allowing pharmacy to be part of this patients care team. 10/22/2011,5:53 PM   Addendum:  Anti-Xa level = 0.43 IU/ml which is within the desired goal range.  Will change bag to our concentration and run at 1000 units/hr.  F/U am labs and adjust.

## 2011-10-23 ENCOUNTER — Inpatient Hospital Stay (HOSPITAL_COMMUNITY): Payer: Medicare HMO

## 2011-10-23 DIAGNOSIS — I517 Cardiomegaly: Secondary | ICD-10-CM

## 2011-10-23 DIAGNOSIS — Z01810 Encounter for preprocedural cardiovascular examination: Secondary | ICD-10-CM

## 2011-10-23 DIAGNOSIS — I2510 Atherosclerotic heart disease of native coronary artery without angina pectoris: Secondary | ICD-10-CM

## 2011-10-23 LAB — BASIC METABOLIC PANEL
BUN: 14 mg/dL (ref 6–23)
CO2: 22 mEq/L (ref 19–32)
Calcium: 9.1 mg/dL (ref 8.4–10.5)
Chloride: 108 mEq/L (ref 96–112)
Creatinine, Ser: 1 mg/dL (ref 0.50–1.35)
GFR calc Af Amer: 79 mL/min — ABNORMAL LOW (ref >90)
GFR calc non Af Amer: 68 mL/min — ABNORMAL LOW (ref >90)
Glucose, Bld: 119 mg/dL — ABNORMAL HIGH (ref 70–99)
Potassium: 4.1 mEq/L (ref 3.5–5.1)
Sodium: 141 mEq/L (ref 135–145)

## 2011-10-23 LAB — BLOOD GAS, ARTERIAL
Acid-base deficit: 2.2 mmol/L — ABNORMAL HIGH (ref 0.0–2.0)
Bicarbonate: 21.7 mEq/L (ref 20.0–24.0)
FIO2: 0.21 %
O2 Saturation: 96.8 %
Patient temperature: 98.6
TCO2: 22.8 mmol/L (ref 0–100)
pCO2 arterial: 35 mmHg (ref 35.0–45.0)
pH, Arterial: 7.41 (ref 7.350–7.450)
pO2, Arterial: 112 mmHg — ABNORMAL HIGH (ref 80.0–100.0)

## 2011-10-23 LAB — GLUCOSE, CAPILLARY
Glucose-Capillary: 122 mg/dL — ABNORMAL HIGH (ref 70–99)
Glucose-Capillary: 118 mg/dL — ABNORMAL HIGH (ref 70–99)
Glucose-Capillary: 182 mg/dL — ABNORMAL HIGH (ref 70–99)
Glucose-Capillary: 108 mg/dL — ABNORMAL HIGH (ref 70–99)
Glucose-Capillary: 113 mg/dL — ABNORMAL HIGH (ref 70–99)

## 2011-10-23 LAB — CBC
HCT: 39.4 % (ref 39.0–52.0)
Hemoglobin: 13.4 g/dL (ref 13.0–17.0)
MCH: 35.3 pg — ABNORMAL HIGH (ref 26.0–34.0)
MCHC: 34 g/dL (ref 30.0–36.0)
MCV: 103.7 fL — ABNORMAL HIGH (ref 78.0–100.0)
Platelets: 93 10*3/uL — ABNORMAL LOW (ref 150–400)
RBC: 3.8 MIL/uL — ABNORMAL LOW (ref 4.22–5.81)
RDW: 13.4 % (ref 11.5–15.5)
WBC: 8.5 10*3/uL (ref 4.0–10.5)

## 2011-10-23 LAB — PULMONARY FUNCTION TEST

## 2011-10-23 LAB — HEMOGLOBIN A1C
Hgb A1c MFr Bld: 6.1 % — ABNORMAL HIGH (ref <5.7)
Hgb A1c MFr Bld: 6.1 % — ABNORMAL HIGH (ref <5.7)
Mean Plasma Glucose: 128 mg/dL — ABNORMAL HIGH (ref <117)
Mean Plasma Glucose: 128 mg/dL — ABNORMAL HIGH (ref <117)

## 2011-10-23 LAB — HEPARIN LEVEL (UNFRACTIONATED)
Heparin Unfractionated: 0.25 IU/mL — ABNORMAL LOW (ref 0.30–0.70)
Heparin Unfractionated: 0.23 IU/mL — ABNORMAL LOW (ref 0.30–0.70)

## 2011-10-23 MED ORDER — MAGNESIUM SULFATE 50 % IJ SOLN
40.0000 meq | INTRAMUSCULAR | Status: DC
  Filled 2011-10-23: qty 10

## 2011-10-23 MED ORDER — HEPARIN (PORCINE) IN NACL 100-0.45 UNIT/ML-% IJ SOLN
1200.0000 [IU]/h | INTRAMUSCULAR | Status: DC
Start: 2011-10-23 — End: 2011-10-23
  Filled 2011-10-23: qty 250

## 2011-10-23 MED ORDER — EPINEPHRINE HCL 1 MG/ML IJ SOLN
0.5000 ug/min | INTRAVENOUS | Status: DC
  Filled 2011-10-23: qty 4

## 2011-10-23 MED ORDER — HEPARIN (PORCINE) IN NACL 100-0.45 UNIT/ML-% IJ SOLN
1000.0000 [IU]/h | INTRAMUSCULAR | Status: DC
  Filled 2011-10-23: qty 250

## 2011-10-23 MED ORDER — DEXTROSE 5 % IV SOLN
30.0000 ug/min | INTRAVENOUS | Status: DC
  Filled 2011-10-23: qty 2

## 2011-10-23 MED ORDER — CHLORHEXIDINE GLUCONATE 4 % EX LIQD
Freq: Once | CUTANEOUS | Status: AC
  Administered 2011-10-23: 22:00:00 via TOPICAL
  Filled 2011-10-23 (×2): qty 15

## 2011-10-23 MED ORDER — NITROGLYCERIN IN D5W 200-5 MCG/ML-% IV SOLN
2.0000 ug/min | INTRAVENOUS | Status: DC
  Filled 2011-10-23: qty 250

## 2011-10-23 MED ORDER — HEPARIN (PORCINE) IN NACL 100-0.45 UNIT/ML-% IJ SOLN
1200.0000 [IU]/h | INTRAMUSCULAR | Status: DC
  Administered 2011-10-23: 1200 [IU]/h via INTRAVENOUS

## 2011-10-23 MED ORDER — POTASSIUM CHLORIDE 2 MEQ/ML IV SOLN
80.0000 meq | INTRAVENOUS | Status: DC
  Filled 2011-10-23: qty 40

## 2011-10-23 MED ORDER — DOPAMINE-DEXTROSE 3.2-5 MG/ML-% IV SOLN
2.0000 ug/kg/min | INTRAVENOUS | Status: DC
  Filled 2011-10-23: qty 250

## 2011-10-23 MED ORDER — SODIUM CHLORIDE 0.9 % IV SOLN
1.5000 mg/kg/h | INTRAVENOUS | Status: DC
  Filled 2011-10-23: qty 25

## 2011-10-23 MED ORDER — DEXTROSE 5 % IV SOLN
750.0000 mg | INTRAVENOUS | Status: DC
  Filled 2011-10-23: qty 750

## 2011-10-23 MED ORDER — DEXTROSE 5 % IV SOLN
1.5000 g | INTRAVENOUS | Status: AC
  Administered 2011-10-24: 1.5 g via INTRAVENOUS
  Administered 2011-10-24: .75 g via INTRAVENOUS
  Filled 2011-10-23: qty 1.5

## 2011-10-23 MED ORDER — VANCOMYCIN HCL 1000 MG IV SOLR
1500.0000 mg | INTRAVENOUS | Status: AC
  Administered 2011-10-24: 1500 mg via INTRAVENOUS
  Filled 2011-10-23: qty 1500

## 2011-10-23 MED ORDER — SODIUM BICARBONATE 8.4 % IV SOLN
INTRAVENOUS | Status: AC
  Administered 2011-10-24: 09:00:00
  Filled 2011-10-23: qty 2.5

## 2011-10-23 MED ORDER — ALBUTEROL SULFATE (5 MG/ML) 0.5% IN NEBU
2.5000 mg | INHALATION_SOLUTION | Freq: Once | RESPIRATORY_TRACT | Status: AC
  Administered 2011-10-23: 2.5 mg via RESPIRATORY_TRACT

## 2011-10-23 MED ORDER — TRANEXAMIC ACID (OHS) PUMP PRIME SOLUTION
2.0000 mg/kg | INTRAVENOUS | Status: DC
  Filled 2011-10-23: qty 1.93

## 2011-10-23 MED ORDER — TRANEXAMIC ACID (OHS) BOLUS VIA INFUSION
15.0000 mg/kg | INTRAVENOUS | Status: DC
  Filled 2011-10-23: qty 1449

## 2011-10-23 MED ORDER — SODIUM CHLORIDE 0.9 % IV SOLN
INTRAVENOUS | Status: DC
  Filled 2011-10-23: qty 1

## 2011-10-23 MED ORDER — SODIUM CHLORIDE 0.9 % IV SOLN
0.1000 ug/kg/h | INTRAVENOUS | Status: DC
  Filled 2011-10-23: qty 4

## 2011-10-23 NOTE — Progress Notes (Signed)
UR Completed. Simmons, Maitland Lesiak F [phone]  

## 2011-10-23 NOTE — Progress Notes (Signed)
ANTICOAGULATION CONSULT NOTE - Follow Up Consult  Pharmacy Consult for heaprin Indication: s/p MI - severe 3VCAD    Allergies  Allergen Reactions  . Aspirin     Hemorrhagic Stroke  . Decongestant (Pseudoephedrine Hcl)     "don't remember"  . Contrast Media (Iodinated Diagnostic Agents) Rash    Patient Measurements: Height: 6\' 3"  (190.5 cm) Weight: 212 lb 15.4 oz (96.6 kg) IBW/kg (Calculated) : 84.5  Heparin Dosing Weight: 96.6kg  Vital Signs: Temp: 97.6 F (36.4 C) (05/16 1408) Temp src: Oral (05/16 1408) BP: 135/79 mmHg (05/16 1408) Pulse Rate: 50  (05/16 1408)  Labs:  Basename 10/23/11 1938 10/23/11 1255 10/23/11 0603 10/22/11 2007 10/22/11 1743  HGB -- -- 13.4 -- 13.8  HCT -- -- 39.4 -- 40.0  PLT -- -- 93* -- 93*  APTT -- -- -- 96* --  LABPROT -- -- -- 14.8 --  INR -- -- -- 1.14 --  HEPARINUNFRC 0.23* 0.25* -- 0.43 --  CREATININE -- -- 1.00 -- 0.99  CKTOTAL -- -- -- -- --  CKMB -- -- -- -- --  TROPONINI -- -- -- -- --    Estimated Creatinine Clearance: 69.2 ml/min (by C-G formula based on Cr of 1).   Medications:  Scheduled:     . albuterol  2.5 mg Nebulization Once  . atenolol  50 mg Oral Daily  . bisacodyl  5 mg Oral Once  . cefUROXime (ZINACEF)  IV  1.5 g Intravenous To OR  . cefUROXime (ZINACEF)  IV  750 mg Intravenous To OR  . chlorhexidine  60 mL Topical Once  . chlorhexidine   Topical Once  . dexmedetomidine (PRECEDEX) IV infusion for high rates  0.1-0.7 mcg/kg/hr Intravenous To OR  . DOPamine  2-20 mcg/kg/min Intravenous To OR  . epinephrine  0.5-20 mcg/min Intravenous To OR  . finasteride  5 mg Oral Daily  . insulin aspart  0-15 Units Subcutaneous TID WC  . insulin (NOVOLIN-R) infusion   Intravenous To OR  . lisinopril  20 mg Oral Daily  . magnesium sulfate  40 mEq Other To OR  . metFORMIN  500 mg Oral Q breakfast  . metoprolol tartrate  12.5 mg Oral Once  . mulitivitamin with minerals  1 tablet Oral Daily  . nitroGLYCERIN  2-200  mcg/min Intravenous To OR  . nitroglycerin-nicardipine-HEPARIN-sodium bicarbonate irrigation for artery spasm   Irrigation To OR  . phenylephrine (NEO-SYNEPHRINE) Adult infusion  30-200 mcg/min Intravenous To OR  . potassium chloride  80 mEq Other To OR  . rosuvastatin  2.5 mg Oral Q M,W,F-1800  . tranexamic acid  15 mg/kg Intravenous To OR  . tranexamic acid  2 mg/kg Intracatheter To OR  . tranexamic acid (CYKLOKAPRON) infusion ([facility])  1.5 mg/kg/hr Intravenous To OR  . vancomycin  1,500 mg Intravenous To OR    Assessment: 78 yo seen in [facility] ED on 5/12 with complaints of chest pain. He was transferred to [place] to be seen by his personal cardiologist there. He underwent cardiac cath which reveals 3V CAD and was transferred here to undergo a TCTS consult. He is to be started on IV heparin while awaiting CABG on 10/24/11. Heparin level today is 0.25 ang Hg=13.4 and stable, platelets are low (93 but stable). Per Rn heparin was off for 30 minutes or more around 9:00am. Repeat heparin level = 0.23 following restart of heparin  Goal of Therapy:  Heparin level 0.3-0.7 units/ml Monitor platelets by anticoagulation protocol: Yes   Plan:  -  increase heparin rate to 1200 units/hr based on low repeat level at current rate of 1000units/hr -f/u am labs -for CABG in am.   Thank you for asking pharmacy to be involved in the care of this patient.  Juliette Alcide, PharmD, BCPS.  Pager: [phone]  10/23/2011 8:46 PM

## 2011-10-23 NOTE — Progress Notes (Addendum)
Procedure(s) (LRB): CORONARY ARTERY BYPASS GRAFTING (CABG) (N/A)  Subjective: Patient without any chest pain or shortness of breath.  Objective: Vital signs in last 24 hours: Patient Vitals for the past 24 hrs:  BP Temp Temp src Pulse Resp SpO2 Height Weight  10/23/11 0624 - - - - - - - 212 lb 15.4 oz (96.6 kg)  10/23/11 0520 107/58 mmHg 98.7 F (37.1 C) Oral 56  18  94 % - -  10/22/11 2035 128/75 mmHg 97.5 F (36.4 C) Oral 62  18  94 % - -  10/22/11 1500 133/81 mmHg 98 F (36.7 C) Oral 55  18  96 % 6\' 3"  (1.905 m) 212 lb 15.4 oz (96.6 kg)    Current Weight  10/23/11 212 lb 15.4 oz (96.6 kg)     Intake/Output from previous day: 05/15 0701 - 05/16 0700 In: 294 [I.V.:294] Out: 1150 [Urine:1150]   Physical Exam:  Cardiovascular: RRR; no murmurs, gallops, or rubs. Pulmonary: Clear to auscultation bilaterally; no rales, wheezes, or rhonchi. Abdomen: Soft, non tender, bowel sounds present. Extremities: No  lower extremity edema.   Lab Results: CBC: Basename 10/23/11 0603 10/22/11 1743  WBC 8.5 10.6*  HGB 13.4 13.8  HCT 39.4 40.0  PLT 93* 93*   BMET:  Basename 10/23/11 0603 10/22/11 1743  NA 141 139  K 4.1 3.6  CL 108 107  CO2 22 24  GLUCOSE 119* 188*  BUN 14 14  CREATININE 1.00 0.99  CALCIUM 9.1 8.8    PT/INR:  Basename 10/22/11 2007  LABPROT 14.8  INR 1.14   ABG:  INR: Will add last result for INR, ABG once components are confirmed Will add last 4 CBG results once components are confirmed  Assessment/Plan: To or in am for cabg.  ZIMMERMAN,DONIELLE MPA-C 10/23/2011   patient examined and medical record reviewed,agree with above note. VAN TRIGT III,Genaro Bekker 10/24/2011

## 2011-10-23 NOTE — Progress Notes (Signed)
Pt's HR drops into 40's while asleep.  Aroused easily, asymptomatic, HR returned to baseline SB in the 50's.  Will con't to monitor.

## 2011-10-23 NOTE — Progress Notes (Addendum)
ANTICOAGULATION CONSULT NOTE - Follow Up Consult  Pharmacy Consult for heaprin Indication: s/p MI - severe 3VCAD    Allergies  Allergen Reactions  . Aspirin     Hemorrhagic Stroke  . Decongestant (Pseudoephedrine Hcl)     "don't remember"  . Contrast Media (Iodinated Diagnostic Agents) Rash    Patient Measurements: Height: 6\' 3"  (190.5 cm) Weight: 212 lb 15.4 oz (96.6 kg) IBW/kg (Calculated) : 84.5  Heparin Dosing Weight: 96.6kg  Vital Signs: Temp: 98.7 F (37.1 C) (05/16 0520) Temp src: Oral (05/16 0520) BP: 107/58 mmHg (05/16 0520) Pulse Rate: 56  (05/16 0520)  Labs:  Basename 10/23/11 1255 10/23/11 0603 10/22/11 2007 10/22/11 1743  HGB -- 13.4 -- 13.8  HCT -- 39.4 -- 40.0  PLT -- 93* -- 93*  APTT -- -- 96* --  LABPROT -- -- 14.8 --  INR -- -- 1.14 --  HEPARINUNFRC 0.25* -- 0.43 --  CREATININE -- 1.00 -- 0.99  CKTOTAL -- -- -- --  CKMB -- -- -- --  TROPONINI -- -- -- --    Estimated Creatinine Clearance: 69.2 ml/min (by C-G formula based on Cr of 1).   Medications:  Scheduled:    . albuterol  2.5 mg Nebulization Once  . atenolol  50 mg Oral Daily  . bisacodyl  5 mg Oral Once  . chlorhexidine  60 mL Topical Once  . finasteride  5 mg Oral Daily  . insulin aspart  0-15 Units Subcutaneous TID WC  . lisinopril  20 mg Oral Daily  . metFORMIN  500 mg Oral Q breakfast  . metoprolol tartrate  12.5 mg Oral Once  . mulitivitamin with minerals  1 tablet Oral Daily  . rosuvastatin  2.5 mg Oral Q M,W,F-1800  . DISCONTD: heparin  4,000 Units Intravenous Once    Assessment: 78 yo seen in [facility] ED on 5/12 with complaints of chest pain. He was transferred to [place] to be seen by his personal cardiologist there. He underwent cardiac cath which reveals 3V CAD and was transferred here to undergo a TCTS consult. He is to be started on IV heparin while awaiting CABG on 10/24/11. Heparin level today is 0.25 ang Hg=13.4 and stable, platelets are low (93 but stable). Per Rn  heparin was off for 30 minutes or more around 9:00am.  Goal of Therapy:  Heparin level 0.3-0.7 units/ml Monitor platelets by anticoagulation protocol: Yes   Plan:  -No heparin changes now but will recheck a heparin level today -Heparin level and CBC daily  Thank you for asking pharmacy to be involved in the care of this patient.  Harland German, Pharm D 10/23/2011 1:47 PM

## 2011-10-23 NOTE — Progress Notes (Signed)
Pre-op Cardiac Surgery  Carotid Findings:  Bilaterally no significant ICA stenosis with antegrade vertebral flow.  Upper Extremity Right Left  Brachial Pressures 131 Tri 124 Tri  Radial Waveforms Tri Tri  Ulnar Waveforms Tri  Tri   Palmar Arch (Allen's Test) Normal with radial compression, obliterates with ulnar compression Normal with radial compression, decreases >50% with ulnar compression    Lower  Extremity Right Left  Dorsalis Pedis 169, Tri 170, Tri  Anterior Tibial    Posterior Tibial 171, Tri 170, Tri  Ankle/Brachial Indices 1.31 1.3    Findings:  Normal ABI study   Farrel Demark, RDMS

## 2011-10-23 NOTE — Progress Notes (Signed)
  Echocardiogram 2D Echocardiogram has been performed.  [name] 10/23/2011, 10:54 AM

## 2011-10-24 ENCOUNTER — Ambulatory Visit (HOSPITAL_COMMUNITY): Admission: RE | Admit: 2011-10-24 | Payer: Medicare Other | Source: Ambulatory Visit | Admitting: Cardiothoracic Surgery

## 2011-10-24 ENCOUNTER — Inpatient Hospital Stay (HOSPITAL_COMMUNITY): Payer: Medicare HMO | Admitting: Anesthesiology

## 2011-10-24 ENCOUNTER — Encounter (HOSPITAL_COMMUNITY)
Admission: RE | Disposition: A | Discharge status: Skilled Nursing Facility | Payer: Self-pay | Source: Other Acute Inpatient Hospital | Attending: Cardiothoracic Surgery

## 2011-10-24 ENCOUNTER — Encounter (HOSPITAL_COMMUNITY): Payer: Self-pay | Admitting: Anesthesiology

## 2011-10-24 ENCOUNTER — Inpatient Hospital Stay (HOSPITAL_COMMUNITY): Payer: Medicare HMO

## 2011-10-24 DIAGNOSIS — I2510 Atherosclerotic heart disease of native coronary artery without angina pectoris: Secondary | ICD-10-CM

## 2011-10-24 HISTORY — PX: CORONARY ARTERY BYPASS GRAFT: SHX141

## 2011-10-24 LAB — APTT: aPTT: 35 seconds (ref 24–37)

## 2011-10-24 LAB — MAGNESIUM: Magnesium: 3.3 mg/dL — ABNORMAL HIGH (ref 1.5–2.5)

## 2011-10-24 LAB — POCT I-STAT 3, ART BLOOD GAS (G3+)
Acid-base deficit: 1 mmol/L (ref 0.0–2.0)
Acid-base deficit: 1 mmol/L (ref 0.0–2.0)
Acid-base deficit: 3 mmol/L — ABNORMAL HIGH (ref 0.0–2.0)
Acid-base deficit: 3 mmol/L — ABNORMAL HIGH (ref 0.0–2.0)
Bicarbonate: 25.9 mEq/L — ABNORMAL HIGH (ref 20.0–24.0)
Bicarbonate: 25.2 mEq/L — ABNORMAL HIGH (ref 20.0–24.0)
Bicarbonate: 25.1 mEq/L — ABNORMAL HIGH (ref 20.0–24.0)
Bicarbonate: 22.4 mEq/L (ref 20.0–24.0)
Bicarbonate: 21.9 mEq/L (ref 20.0–24.0)
O2 Saturation: 100 %
O2 Saturation: 99 %
O2 Saturation: 92 %
O2 Saturation: 97 %
O2 Saturation: 97 %
Patient temperature: 31.5
Patient temperature: 37
Patient temperature: 35.6
Patient temperature: 36.5
Patient temperature: 36.6
TCO2: 27 mmol/L (ref 0–100)
TCO2: 27 mmol/L (ref 0–100)
TCO2: 26 mmol/L (ref 0–100)
TCO2: 24 mmol/L (ref 0–100)
TCO2: 23 mmol/L (ref 0–100)
pCO2 arterial: 38.9 mmHg (ref 35.0–45.0)
pCO2 arterial: 46.4 mmHg — ABNORMAL HIGH (ref 35.0–45.0)
pCO2 arterial: 42.8 mmHg (ref 35.0–45.0)
pCO2 arterial: 38.8 mmHg (ref 35.0–45.0)
pCO2 arterial: 36.7 mmHg (ref 35.0–45.0)
pH, Arterial: 7.406 (ref 7.350–7.450)
pH, Arterial: 7.343 — ABNORMAL LOW (ref 7.350–7.450)
pH, Arterial: 7.369 (ref 7.350–7.450)
pH, Arterial: 7.367 (ref 7.350–7.450)
pH, Arterial: 7.383 (ref 7.350–7.450)
pO2, Arterial: 264 mmHg — ABNORMAL HIGH (ref 80.0–100.0)
pO2, Arterial: 172 mmHg — ABNORMAL HIGH (ref 80.0–100.0)
pO2, Arterial: 63 mmHg — ABNORMAL LOW (ref 80.0–100.0)
pO2, Arterial: 93 mmHg (ref 80.0–100.0)
pO2, Arterial: 89 mmHg (ref 80.0–100.0)

## 2011-10-24 LAB — POCT I-STAT, CHEM 8
BUN: 7 mg/dL (ref 6–23)
Calcium, Ion: 1.01 mmol/L — ABNORMAL LOW (ref 1.12–1.32)
Chloride: 109 mEq/L (ref 96–112)
Creatinine, Ser: 0.8 mg/dL (ref 0.50–1.35)
Glucose, Bld: 163 mg/dL — ABNORMAL HIGH (ref 70–99)
HCT: 21 % — ABNORMAL LOW (ref 39.0–52.0)
Hemoglobin: 7.1 g/dL — ABNORMAL LOW (ref 13.0–17.0)
Potassium: 3.7 mEq/L (ref 3.5–5.1)
Sodium: 141 mEq/L (ref 135–145)
TCO2: 20 mmol/L (ref 0–100)

## 2011-10-24 LAB — CBC
HCT: 24 % — ABNORMAL LOW (ref 39.0–52.0)
Hemoglobin: 9.3 g/dL — ABNORMAL LOW (ref 13.0–17.0)
Hemoglobin: 8.5 g/dL — ABNORMAL LOW (ref 13.0–17.0)
MCH: 35.4 pg — ABNORMAL HIGH (ref 26.0–34.0)
MCH: 35.3 pg — ABNORMAL HIGH (ref 26.0–34.0)
MCHC: 35.4 g/dL (ref 30.0–36.0)
MCV: 99.6 fL (ref 78.0–100.0)
Platelets: 119 10*3/uL — ABNORMAL LOW (ref 150–400)
RBC: 2.63 MIL/uL — ABNORMAL LOW (ref 4.22–5.81)
RBC: 2.41 MIL/uL — ABNORMAL LOW (ref 4.22–5.81)
RDW: 13 % (ref 11.5–15.5)
WBC: 9.7 10*3/uL (ref 4.0–10.5)

## 2011-10-24 LAB — SURGICAL PCR SCREEN
MRSA, PCR: POSITIVE — AB
Staphylococcus aureus: POSITIVE — AB

## 2011-10-24 LAB — POCT I-STAT 4, (NA,K, GLUC, HGB,HCT)
Glucose, Bld: 143 mg/dL — ABNORMAL HIGH (ref 70–99)
Glucose, Bld: 110 mg/dL — ABNORMAL HIGH (ref 70–99)
Glucose, Bld: 125 mg/dL — ABNORMAL HIGH (ref 70–99)
Glucose, Bld: 133 mg/dL — ABNORMAL HIGH (ref 70–99)
Glucose, Bld: 199 mg/dL — ABNORMAL HIGH (ref 70–99)
Glucose, Bld: 177 mg/dL — ABNORMAL HIGH (ref 70–99)
HCT: 36 % — ABNORMAL LOW (ref 39.0–52.0)
HCT: 33 % — ABNORMAL LOW (ref 39.0–52.0)
HCT: 26 % — ABNORMAL LOW (ref 39.0–52.0)
HCT: 26 % — ABNORMAL LOW (ref 39.0–52.0)
HCT: 29 % — ABNORMAL LOW (ref 39.0–52.0)
HCT: 26 % — ABNORMAL LOW (ref 39.0–52.0)
Hemoglobin: 12.2 g/dL — ABNORMAL LOW (ref 13.0–17.0)
Hemoglobin: 11.2 g/dL — ABNORMAL LOW (ref 13.0–17.0)
Hemoglobin: 8.8 g/dL — ABNORMAL LOW (ref 13.0–17.0)
Hemoglobin: 8.8 g/dL — ABNORMAL LOW (ref 13.0–17.0)
Hemoglobin: 9.9 g/dL — ABNORMAL LOW (ref 13.0–17.0)
Hemoglobin: 8.8 g/dL — ABNORMAL LOW (ref 13.0–17.0)
Potassium: 3.9 mEq/L (ref 3.5–5.1)
Potassium: 3.9 mEq/L (ref 3.5–5.1)
Potassium: 4.7 mEq/L (ref 3.5–5.1)
Potassium: 4.9 mEq/L (ref 3.5–5.1)
Potassium: 3.9 mEq/L (ref 3.5–5.1)
Potassium: 3.7 mEq/L (ref 3.5–5.1)
Sodium: 140 mEq/L (ref 135–145)
Sodium: 140 mEq/L (ref 135–145)
Sodium: 137 mEq/L (ref 135–145)
Sodium: 136 mEq/L (ref 135–145)
Sodium: 138 mEq/L (ref 135–145)
Sodium: 139 mEq/L (ref 135–145)

## 2011-10-24 LAB — POCT I-STAT 3, VENOUS BLOOD GAS (G3P V)
Acid-base deficit: 2 mmol/L (ref 0.0–2.0)
Bicarbonate: 24.5 mEq/L — ABNORMAL HIGH (ref 20.0–24.0)
O2 Saturation: 79 %
Patient temperature: 31.5
TCO2: 26 mmol/L (ref 0–100)
pCO2, Ven: 38.1 mmHg — ABNORMAL LOW (ref 45.0–50.0)
pH, Ven: 7.389 — ABNORMAL HIGH (ref 7.250–7.300)
pO2, Ven: 32 mmHg (ref 30.0–45.0)

## 2011-10-24 LAB — CREATININE, SERUM
Creatinine, Ser: 0.73 mg/dL (ref 0.50–1.35)
GFR calc Af Amer: >90 mL/min (ref >90)
GFR calc non Af Amer: 85 mL/min — ABNORMAL LOW (ref >90)

## 2011-10-24 LAB — HEMOGLOBIN AND HEMATOCRIT, BLOOD
HCT: 25.6 % — ABNORMAL LOW (ref 39.0–52.0)
Hemoglobin: 8.9 g/dL — ABNORMAL LOW (ref 13.0–17.0)

## 2011-10-24 LAB — GLUCOSE, CAPILLARY: Glucose-Capillary: 116 mg/dL — ABNORMAL HIGH (ref 70–99)

## 2011-10-24 LAB — HEPARIN LEVEL (UNFRACTIONATED): Heparin Unfractionated: 0.29 IU/mL — ABNORMAL LOW (ref 0.30–0.70)

## 2011-10-24 LAB — PROTIME-INR
INR: 1.4 (ref 0.00–1.49)
Prothrombin Time: 17.4 seconds — ABNORMAL HIGH (ref 11.6–15.2)

## 2011-10-24 LAB — POCT I-STAT GLUCOSE
Glucose, Bld: 188 mg/dL — ABNORMAL HIGH (ref 70–99)
Operator id: 3390

## 2011-10-24 LAB — PLATELET COUNT: Platelets: 71 10*3/uL — ABNORMAL LOW (ref 150–400)

## 2011-10-24 SURGERY — CORONARY ARTERY BYPASS GRAFTING (CABG)
Anesthesia: General | Site: Chest | Wound class: Clean

## 2011-10-24 MED ORDER — DOCUSATE SODIUM 100 MG PO CAPS
200.0000 mg | ORAL_CAPSULE | Freq: Every day | ORAL | Status: DC
  Administered 2011-10-25 – 2011-10-27 (×3): 200 mg via ORAL
  Filled 2011-10-24 (×4): qty 2

## 2011-10-24 MED ORDER — SODIUM BICARBONATE 8.4 % IV SOLN
INTRAVENOUS | Status: AC
  Administered 2011-10-24: 11:00:00
  Filled 2011-10-24 (×2): qty 2.5

## 2011-10-24 MED ORDER — ONDANSETRON HCL 4 MG/2ML IJ SOLN
4.0000 mg | Freq: Four times a day (QID) | INTRAMUSCULAR | Status: DC | PRN
  Administered 2011-10-26: 4 mg via INTRAVENOUS
  Filled 2011-10-24: qty 2

## 2011-10-24 MED ORDER — ALBUMIN HUMAN 5 % IV SOLN
250.0000 mL | INTRAVENOUS | Status: AC | PRN
  Administered 2011-10-24 (×2): 250 mL via INTRAVENOUS
  Filled 2011-10-24: qty 250

## 2011-10-24 MED ORDER — PHENYLEPHRINE HCL 10 MG/ML IJ SOLN
20.0000 mg | INTRAVENOUS | Status: DC | PRN
  Administered 2011-10-24: 25 ug/min via INTRAVENOUS

## 2011-10-24 MED ORDER — NITROGLYCERIN IN D5W 200-5 MCG/ML-% IV SOLN
INTRAVENOUS | Status: DC | PRN
  Administered 2011-10-24: 16.6 ug/min via INTRAVENOUS

## 2011-10-24 MED ORDER — AMIODARONE HCL IN DEXTROSE 360-4.14 MG/200ML-% IV SOLN
INTRAVENOUS | Status: DC | PRN
  Administered 2011-10-24: 150 mg via INTRAVENOUS

## 2011-10-24 MED ORDER — METOPROLOL TARTRATE 25 MG/10 ML ORAL SUSPENSION
12.5000 mg | Freq: Two times a day (BID) | ORAL | Status: DC
  Filled 2011-10-24 (×5): qty 5

## 2011-10-24 MED ORDER — PHENYLEPHRINE HCL 10 MG/ML IJ SOLN
0.0000 ug/min | INTRAVENOUS | Status: DC
  Administered 2011-10-25: 70 ug/min via INTRAVENOUS
  Administered 2011-10-25: 45 ug/min via INTRAVENOUS
  Administered 2011-10-25 (×2): 55 ug/min via INTRAVENOUS
  Administered 2011-10-26: 50 ug/min via INTRAVENOUS
  Filled 2011-10-24 (×6): qty 2

## 2011-10-24 MED ORDER — SODIUM CHLORIDE 0.45 % IV SOLN
INTRAVENOUS | Status: DC
  Administered 2011-10-24: 20 mL/h via INTRAVENOUS

## 2011-10-24 MED ORDER — ROCURONIUM BROMIDE 100 MG/10ML IV SOLN
INTRAVENOUS | Status: DC | PRN
  Administered 2011-10-24: 20 mg via INTRAVENOUS
  Administered 2011-10-24: 30 mg via INTRAVENOUS

## 2011-10-24 MED ORDER — FENTANYL CITRATE 0.05 MG/ML IJ SOLN
INTRAMUSCULAR | Status: DC | PRN
  Administered 2011-10-24: 250 ug via INTRAVENOUS
  Administered 2011-10-24: 750 ug via INTRAVENOUS
  Administered 2011-10-24: 50 ug via INTRAVENOUS
  Administered 2011-10-24: 200 ug via INTRAVENOUS

## 2011-10-24 MED ORDER — VECURONIUM BROMIDE 10 MG IV SOLR
INTRAVENOUS | Status: DC | PRN
  Administered 2011-10-24: 10 mg via INTRAVENOUS
  Administered 2011-10-24: 2 mg via INTRAVENOUS
  Administered 2011-10-24: 3 mg via INTRAVENOUS
  Administered 2011-10-24: 4 mg via INTRAVENOUS

## 2011-10-24 MED ORDER — LACTATED RINGERS IV SOLN: 500.0000 mL | Freq: Once | INTRAVENOUS | Status: AC | PRN

## 2011-10-24 MED ORDER — ACETAMINOPHEN 160 MG/5ML PO SOLN: 650.0000 mg | ORAL | Status: AC

## 2011-10-24 MED ORDER — PANTOPRAZOLE SODIUM 40 MG PO TBEC
40.0000 mg | DELAYED_RELEASE_TABLET | Freq: Every day | ORAL | Status: DC
  Administered 2011-10-26 – 2011-10-27 (×2): 40 mg via ORAL
  Filled 2011-10-24 (×2): qty 1

## 2011-10-24 MED ORDER — HEMOSTATIC AGENTS (NO CHARGE) OPTIME
TOPICAL | Status: DC | PRN
  Administered 2011-10-24: 1 via TOPICAL

## 2011-10-24 MED ORDER — 0.9 % SODIUM CHLORIDE (POUR BTL) OPTIME
TOPICAL | Status: DC | PRN
  Administered 2011-10-24: 5000 mL

## 2011-10-24 MED ORDER — DOPAMINE-DEXTROSE 3.2-5 MG/ML-% IV SOLN
0.0000 ug/kg/min | INTRAVENOUS | Status: DC
  Administered 2011-10-26: 3 ug/kg/min via INTRAVENOUS
  Filled 2011-10-24: qty 250

## 2011-10-24 MED ORDER — POTASSIUM CHLORIDE 10 MEQ/50ML IV SOLN
10.0000 meq | INTRAVENOUS | Status: AC
  Administered 2011-10-24 – 2011-10-25 (×3): 10 meq via INTRAVENOUS

## 2011-10-24 MED ORDER — DEXTROSE 5 % IV SOLN
1.5000 g | Freq: Two times a day (BID) | INTRAVENOUS | Status: AC
  Administered 2011-10-24 – 2011-10-26 (×4): 1.5 g via INTRAVENOUS
  Filled 2011-10-24 (×4): qty 1.5

## 2011-10-24 MED ORDER — SODIUM CHLORIDE 0.9 % IV SOLN
200.0000 ug | INTRAVENOUS | Status: DC | PRN
  Administered 2011-10-24: 0.2 ug/kg/h via INTRAVENOUS

## 2011-10-24 MED ORDER — BISACODYL 10 MG RE SUPP: 10.0000 mg | Freq: Every day | RECTAL | Status: DC

## 2011-10-24 MED ORDER — SODIUM CHLORIDE 0.9 % IV SOLN
INTRAVENOUS | Status: DC
  Administered 2011-10-26: 08:00:00 via INTRAVENOUS

## 2011-10-24 MED ORDER — MIDAZOLAM HCL 5 MG/5ML IJ SOLN
INTRAMUSCULAR | Status: DC | PRN
  Administered 2011-10-24: 2 mg via INTRAVENOUS
  Administered 2011-10-24: 1 mg via INTRAVENOUS
  Administered 2011-10-24 (×2): 3 mg via INTRAVENOUS
  Administered 2011-10-24: 1 mg via INTRAVENOUS

## 2011-10-24 MED ORDER — BISACODYL 5 MG PO TBEC
10.0000 mg | DELAYED_RELEASE_TABLET | Freq: Every day | ORAL | Status: DC
  Administered 2011-10-25 – 2011-10-27 (×3): 10 mg via ORAL
  Filled 2011-10-24 (×4): qty 2

## 2011-10-24 MED ORDER — POTASSIUM CHLORIDE 10 MEQ/50ML IV SOLN
INTRAVENOUS | Status: AC
  Administered 2011-10-24: 10 meq via INTRAVENOUS
  Filled 2011-10-24: qty 50

## 2011-10-24 MED ORDER — FINASTERIDE 5 MG PO TABS
5.0000 mg | ORAL_TABLET | Freq: Every day | ORAL | Status: DC
  Administered 2011-10-25 – 2011-11-03 (×10): 5 mg via ORAL
  Filled 2011-10-24 (×10): qty 1

## 2011-10-24 MED ORDER — POTASSIUM CHLORIDE 10 MEQ/50ML IV SOLN
10.0000 meq | Freq: Once | INTRAVENOUS | Status: AC
  Administered 2011-10-24: 10 meq via INTRAVENOUS

## 2011-10-24 MED ORDER — MORPHINE SULFATE 2 MG/ML IJ SOLN
1.0000 mg | INTRAMUSCULAR | Status: AC | PRN
  Administered 2011-10-24: 2 mg via INTRAVENOUS
  Filled 2011-10-24: qty 1

## 2011-10-24 MED ORDER — VANCOMYCIN HCL IN DEXTROSE 1-5 GM/200ML-% IV SOLN
1000.0000 mg | Freq: Once | INTRAVENOUS | Status: AC
  Administered 2011-10-24: 1000 mg via INTRAVENOUS
  Filled 2011-10-24: qty 200

## 2011-10-24 MED ORDER — LACTATED RINGERS IV SOLN: INTRAVENOUS | Status: DC

## 2011-10-24 MED ORDER — NITROGLYCERIN IN D5W 200-5 MCG/ML-% IV SOLN: 0.0000 ug/min | INTRAVENOUS | Status: DC

## 2011-10-24 MED ORDER — ASPIRIN 81 MG PO TBEC: 81.0000 mg | DELAYED_RELEASE_TABLET | Freq: Every day | ORAL | Status: DC

## 2011-10-24 MED ORDER — FAMOTIDINE IN NACL 20-0.9 MG/50ML-% IV SOLN
20.0000 mg | Freq: Two times a day (BID) | INTRAVENOUS | Status: DC
  Administered 2011-10-24: 20 mg via INTRAVENOUS

## 2011-10-24 MED ORDER — SODIUM CHLORIDE 0.9 % IV SOLN
INTRAVENOUS | Status: DC | PRN
  Administered 2011-10-24 (×3): via INTRAVENOUS

## 2011-10-24 MED ORDER — MORPHINE SULFATE 2 MG/ML IJ SOLN
2.0000 mg | INTRAMUSCULAR | Status: DC | PRN
  Administered 2011-10-25: 2 mg via INTRAVENOUS
  Filled 2011-10-24 (×2): qty 1

## 2011-10-24 MED ORDER — METOPROLOL TARTRATE 12.5 MG HALF TABLET
12.5000 mg | ORAL_TABLET | Freq: Two times a day (BID) | ORAL | Status: DC
  Administered 2011-10-25: 12.5 mg via ORAL
  Filled 2011-10-24 (×5): qty 1

## 2011-10-24 MED ORDER — SODIUM CHLORIDE 0.9 % IJ SOLN
3.0000 mL | Freq: Two times a day (BID) | INTRAMUSCULAR | Status: DC
  Administered 2011-10-25 – 2011-10-26 (×3): 3 mL via INTRAVENOUS
  Administered 2011-10-26: 20:00:00 via INTRAVENOUS
  Administered 2011-10-27 – 2011-10-28 (×3): 3 mL via INTRAVENOUS
  Administered 2011-10-28: 11:00:00 via INTRAVENOUS
  Administered 2011-10-29 – 2011-11-03 (×10): 3 mL via INTRAVENOUS

## 2011-10-24 MED ORDER — CHLORHEXIDINE GLUCONATE CLOTH 2 % EX PADS
6.0000 | MEDICATED_PAD | Freq: Every day | CUTANEOUS | Status: DC
  Administered 2011-10-24: 6 via TOPICAL

## 2011-10-24 MED ORDER — ACETAMINOPHEN 160 MG/5ML PO SOLN
975.0000 mg | Freq: Four times a day (QID) | ORAL | Status: AC
  Administered 2011-10-24: 975 mg
  Filled 2011-10-24: qty 40.6

## 2011-10-24 MED ORDER — SODIUM CHLORIDE 0.9 % IJ SOLN: 3.0000 mL | INTRAMUSCULAR | Status: DC | PRN

## 2011-10-24 MED ORDER — TRANEXAMIC ACID 100 MG/ML IV SOLN
2500.0000 mg | INTRAVENOUS | Status: DC | PRN
  Administered 2011-10-24: 15 mg/kg/h via INTRAVENOUS

## 2011-10-24 MED ORDER — MIDAZOLAM HCL 2 MG/2ML IJ SOLN: 2.0000 mg | INTRAMUSCULAR | Status: DC | PRN

## 2011-10-24 MED ORDER — SODIUM CHLORIDE 0.9 % IJ SOLN
OROMUCOSAL | Status: DC | PRN
  Administered 2011-10-24 (×4): via TOPICAL

## 2011-10-24 MED ORDER — ACETAMINOPHEN 650 MG RE SUPP
650.0000 mg | RECTAL | Status: AC
  Administered 2011-10-24: 650 mg via RECTAL

## 2011-10-24 MED ORDER — METOPROLOL TARTRATE 1 MG/ML IV SOLN: 2.5000 mg | INTRAVENOUS | Status: DC | PRN

## 2011-10-24 MED ORDER — SODIUM CHLORIDE 0.9 % IV SOLN
0.1000 ug/kg/h | INTRAVENOUS | Status: DC
  Administered 2011-10-24: 0.5 ug/kg/h via INTRAVENOUS
  Filled 2011-10-24 (×2): qty 2

## 2011-10-24 MED ORDER — ASPIRIN EC 81 MG PO TBEC
81.0000 mg | DELAYED_RELEASE_TABLET | Freq: Every day | ORAL | Status: DC
  Administered 2011-10-25 – 2011-11-03 (×9): 81 mg via ORAL
  Filled 2011-10-24 (×11): qty 1

## 2011-10-24 MED ORDER — ACETAMINOPHEN 500 MG PO TABS
1000.0000 mg | ORAL_TABLET | Freq: Four times a day (QID) | ORAL | Status: AC
  Administered 2011-10-25 – 2011-10-29 (×17): 1000 mg via ORAL
  Filled 2011-10-24 (×19): qty 2

## 2011-10-24 MED ORDER — SODIUM CHLORIDE 0.9 % IV SOLN
INTRAVENOUS | Status: DC
  Administered 2011-10-24: 22:00:00 via INTRAVENOUS
  Administered 2011-10-25: 11.6 [IU]/h via INTRAVENOUS
  Filled 2011-10-24 (×4): qty 1

## 2011-10-24 MED ORDER — PROPOFOL 10 MG/ML IV EMUL
INTRAVENOUS | Status: DC | PRN
  Administered 2011-10-24: 50 mg via INTRAVENOUS
  Administered 2011-10-24: 100 mg via INTRAVENOUS
  Administered 2011-10-24: 50 mg via INTRAVENOUS

## 2011-10-24 MED ORDER — POTASSIUM CHLORIDE 10 MEQ/50ML IV SOLN
10.0000 meq | INTRAVENOUS | Status: AC
  Administered 2011-10-24 (×3): 10 meq via INTRAVENOUS

## 2011-10-24 MED ORDER — AMIODARONE HCL IN DEXTROSE 360-4.14 MG/200ML-% IV SOLN
30.0000 mg/h | INTRAVENOUS | Status: DC
  Administered 2011-10-24 – 2011-10-26 (×4): 30 mg/h via INTRAVENOUS
  Filled 2011-10-24 (×8): qty 200

## 2011-10-24 MED ORDER — DEXTROSE 5 % IV SOLN
30.0000 mg/h | INTRAVENOUS | Status: DC
  Filled 2011-10-24: qty 9

## 2011-10-24 MED ORDER — HEPARIN SODIUM (PORCINE) 1000 UNIT/ML IJ SOLN
INTRAMUSCULAR | Status: DC | PRN
  Administered 2011-10-24: 2000 [IU] via INTRAVENOUS
  Administered 2011-10-24: 27000 [IU] via INTRAVENOUS
  Administered 2011-10-24: 4000 [IU] via INTRAVENOUS

## 2011-10-24 MED ORDER — LACTATED RINGERS IV SOLN
INTRAVENOUS | Status: DC | PRN
  Administered 2011-10-24 (×2): via INTRAVENOUS

## 2011-10-24 MED ORDER — INSULIN REGULAR BOLUS VIA INFUSION
0.0000 [IU] | Freq: Three times a day (TID) | INTRAVENOUS | Status: DC
  Administered 2011-10-25: 4 [IU] via INTRAVENOUS
  Administered 2011-10-26: 2 [IU] via INTRAVENOUS
  Administered 2011-10-27: 4 [IU] via INTRAVENOUS
  Filled 2011-10-24: qty 10

## 2011-10-24 MED ORDER — MAGNESIUM SULFATE 40 MG/ML IJ SOLN
4.0000 g | Freq: Once | INTRAMUSCULAR | Status: AC
  Administered 2011-10-24: 4 g via INTRAVENOUS
  Filled 2011-10-24: qty 100

## 2011-10-24 MED ORDER — SODIUM CHLORIDE 0.9 % IV SOLN: 250.0000 mL | INTRAVENOUS | Status: DC

## 2011-10-24 MED ORDER — MUPIROCIN 2 % EX OINT
1.0000 "application " | TOPICAL_OINTMENT | Freq: Two times a day (BID) | CUTANEOUS | Status: AC
  Administered 2011-10-24 – 2011-10-28 (×10): 1 via NASAL
  Filled 2011-10-24: qty 22

## 2011-10-24 MED ORDER — MUPIROCIN 2 % EX OINT
1.0000 "application " | TOPICAL_OINTMENT | Freq: Two times a day (BID) | CUTANEOUS | Status: DC
  Filled 2011-10-24: qty 22

## 2011-10-24 MED ORDER — AMIODARONE HCL IN DEXTROSE 360-4.14 MG/200ML-% IV SOLN
60.0000 mg/h | INTRAVENOUS | Status: DC
  Filled 2011-10-24: qty 200

## 2011-10-24 MED ORDER — OXYCODONE HCL 5 MG PO TABS
5.0000 mg | ORAL_TABLET | ORAL | Status: DC | PRN
  Administered 2011-10-24 – 2011-10-25 (×3): 10 mg via ORAL
  Filled 2011-10-24 (×3): qty 2

## 2011-10-24 MED ORDER — AMIODARONE HCL IN DEXTROSE 360-4.14 MG/200ML-% IV SOLN
INTRAVENOUS | Status: DC | PRN
  Administered 2011-10-24: 60 mg/h via INTRAVENOUS

## 2011-10-24 MED ORDER — SODIUM BICARBONATE 8.4 % IV SOLN: INTRAVENOUS | Status: DC

## 2011-10-24 MED ORDER — DOPAMINE-DEXTROSE 3.2-5 MG/ML-% IV SOLN
INTRAVENOUS | Status: DC | PRN
  Administered 2011-10-24: 3 ug/kg/min via INTRAVENOUS

## 2011-10-24 MED ORDER — SODIUM CHLORIDE 0.9 % IV SOLN
100.0000 [IU] | INTRAVENOUS | Status: DC | PRN
  Administered 2011-10-24: 2.5 [IU]/h via INTRAVENOUS

## 2011-10-24 MED ORDER — PROTAMINE SULFATE 10 MG/ML IV SOLN
INTRAVENOUS | Status: DC | PRN
  Administered 2011-10-24: 300 mg via INTRAVENOUS

## 2011-10-24 SURGICAL SUPPLY — 135 items
ADAPTER CARDIO PERF ANTE/RETRO (ADAPTER) ×3 IMPLANT
ANTEGRADE CPLG (MISCELLANEOUS) ×3 IMPLANT
APPLIER CLIP 9.375 MED OPEN (MISCELLANEOUS)
APPLIER CLIP 9.375 SM OPEN (CLIP)
ATTRACTOMAT 16X20 MAGNETIC DRP (DRAPES) ×3 IMPLANT
BAG DECANTER FOR FLEXI CONT (MISCELLANEOUS) ×6 IMPLANT
BANDAGE ELASTIC 4 VELCRO ST LF (GAUZE/BANDAGES/DRESSINGS) ×6 IMPLANT
BANDAGE ELASTIC 6 VELCRO ST LF (GAUZE/BANDAGES/DRESSINGS) ×6 IMPLANT
BANDAGE GAUZE ELAST BULKY 4 IN (GAUZE/BANDAGES/DRESSINGS) ×6 IMPLANT
BASKET HEART  (ORDER IN 25'S) (MISCELLANEOUS) ×1
BASKET HEART (ORDER IN 25'S) (MISCELLANEOUS) ×1
BASKET HEART (ORDER IN 25S) (MISCELLANEOUS) ×1 IMPLANT
BLADE STERNUM SYSTEM 6 (BLADE) ×3 IMPLANT
BLADE SURG 11 STRL SS (BLADE) ×3 IMPLANT
BLADE SURG 12 STRL SS (BLADE) ×3 IMPLANT
BLADE SURG ROTATE 9660 (MISCELLANEOUS) IMPLANT
CANISTER SUCTION 2500CC (MISCELLANEOUS) ×3 IMPLANT
CANNULA GUNDRY RCSP 15FR (MISCELLANEOUS) ×3 IMPLANT
CANNULA VENOUS MAL SGL STG 40 (MISCELLANEOUS) IMPLANT
CANNULA VESSEL W/WING W/VALVE (CANNULA) ×6 IMPLANT
CANNULAE VENOUS MAL SGL STG 40 (MISCELLANEOUS)
CATH CPB KIT VANTRIGT (MISCELLANEOUS) ×3 IMPLANT
CATH ROBINSON RED A/P 18FR (CATHETERS) ×9 IMPLANT
CATH THORACIC 28FR (CATHETERS) IMPLANT
CATH THORACIC 28FR RT ANG (CATHETERS) IMPLANT
CATH THORACIC 36FR (CATHETERS) IMPLANT
CATH THORACIC 36FR RT ANG (CATHETERS) ×6 IMPLANT
CLIP APPLIE 9.375 MED OPEN (MISCELLANEOUS) IMPLANT
CLIP APPLIE 9.375 SM OPEN (CLIP) IMPLANT
CLIP FOGARTY SPRING 6M (CLIP) ×3 IMPLANT
CLIP TI MEDIUM 24 (CLIP) IMPLANT
CLIP TI WIDE RED SMALL 24 (CLIP) ×9 IMPLANT
CLOTH BEACON ORANGE TIMEOUT ST (SAFETY) ×3 IMPLANT
CONN Y 3/8X3/8X3/8  BEN (MISCELLANEOUS)
CONN Y 3/8X3/8X3/8 BEN (MISCELLANEOUS) IMPLANT
COVER SURGICAL LIGHT HANDLE (MISCELLANEOUS) ×6 IMPLANT
CRADLE DONUT ADULT HEAD (MISCELLANEOUS) ×3 IMPLANT
DERMABOND ADVANCED (GAUZE/BANDAGES/DRESSINGS) ×2
DERMABOND ADVANCED .7 DNX12 (GAUZE/BANDAGES/DRESSINGS) ×1 IMPLANT
DRAIN CHANNEL 32F RND 10.7 FF (WOUND CARE) ×3 IMPLANT
DRAPE CARDIOVASCULAR INCISE (DRAPES) ×2
DRAPE SLUSH MACHINE 52X66 (DRAPES) IMPLANT
DRAPE SLUSH/WARMER DISC (DRAPES) IMPLANT
DRAPE SRG 135X102X78XABS (DRAPES) ×1 IMPLANT
DRSG COVADERM 4X14 (GAUZE/BANDAGES/DRESSINGS) ×3 IMPLANT
ELECT BLADE 4.0 EZ CLEAN MEGAD (MISCELLANEOUS) ×3
ELECT BLADE 6.5 EXT (BLADE) ×3 IMPLANT
ELECT CAUTERY BLADE 6.4 (BLADE) ×3 IMPLANT
ELECT REM PT RETURN 9FT ADLT (ELECTROSURGICAL) ×6
ELECTRODE BLDE 4.0 EZ CLN MEGD (MISCELLANEOUS) ×1 IMPLANT
ELECTRODE REM PT RTRN 9FT ADLT (ELECTROSURGICAL) ×2 IMPLANT
GLOVE BIO SURGEON STRL SZ 6 (GLOVE) ×15 IMPLANT
GLOVE BIO SURGEON STRL SZ 6.5 (GLOVE) ×4 IMPLANT
GLOVE BIO SURGEON STRL SZ7 (GLOVE) ×3 IMPLANT
GLOVE BIO SURGEON STRL SZ7.5 (GLOVE) ×12 IMPLANT
GLOVE BIO SURGEONS STRL SZ 6.5 (GLOVE) ×2
GLOVE BIOGEL PI IND STRL 6 (GLOVE) ×3 IMPLANT
GLOVE BIOGEL PI IND STRL 6.5 (GLOVE) IMPLANT
GLOVE BIOGEL PI IND STRL 7.0 (GLOVE) ×4 IMPLANT
GLOVE BIOGEL PI INDICATOR 6 (GLOVE) ×6
GLOVE BIOGEL PI INDICATOR 6.5 (GLOVE)
GLOVE BIOGEL PI INDICATOR 7.0 (GLOVE) ×8
GLOVE EUDERMIC 7 POWDERFREE (GLOVE) IMPLANT
GLOVE ORTHO TXT STRL SZ7.5 (GLOVE) ×3 IMPLANT
GOWN STRL NON-REIN LRG LVL3 (GOWN DISPOSABLE) ×33 IMPLANT
HEMOSTAT POWDER SURGIFOAM 1G (HEMOSTASIS) ×12 IMPLANT
HEMOSTAT SURGICEL 2X14 (HEMOSTASIS) ×3 IMPLANT
INSERT FOGARTY 61MM (MISCELLANEOUS) IMPLANT
INSERT FOGARTY XLG (MISCELLANEOUS) IMPLANT
KIT BASIN OR (CUSTOM PROCEDURE TRAY) ×3 IMPLANT
KIT ROOM TURNOVER OR (KITS) ×3 IMPLANT
KIT SUCTION CATH 14FR (SUCTIONS) ×6 IMPLANT
KIT VASOVIEW ACCESSORY VH 2004 (KITS) ×3 IMPLANT
KIT VASOVIEW W/TROCAR VH 2000 (KITS) ×3 IMPLANT
LEAD PACING MYOCARDI (MISCELLANEOUS) ×3 IMPLANT
MARKER GRAFT CORONARY BYPASS (MISCELLANEOUS) ×9 IMPLANT
NS IRRIG 1000ML POUR BTL (IV SOLUTION) ×18 IMPLANT
PACK OPEN HEART (CUSTOM PROCEDURE TRAY) ×3 IMPLANT
PAD ARMBOARD 7.5X6 YLW CONV (MISCELLANEOUS) ×6 IMPLANT
PENCIL BUTTON HOLSTER BLD 10FT (ELECTRODE) ×3 IMPLANT
PUNCH AORTIC ROTATE 4.0MM (MISCELLANEOUS) ×3 IMPLANT
PUNCH AORTIC ROTATE 4.5MM 8IN (MISCELLANEOUS) IMPLANT
PUNCH AORTIC ROTATE 5MM 8IN (MISCELLANEOUS) IMPLANT
SET CARDIOPLEGIA MPS 5001102 (MISCELLANEOUS) ×3 IMPLANT
SOLUTION ANTI FOG 6CC (MISCELLANEOUS) IMPLANT
SPONGE GAUZE 4X4 12PLY (GAUZE/BANDAGES/DRESSINGS) ×12 IMPLANT
SPONGE INTESTINAL PEANUT (DISPOSABLE) IMPLANT
SPONGE LAP 18X18 X RAY DECT (DISPOSABLE) ×6 IMPLANT
SPONGE LAP 4X18 X RAY DECT (DISPOSABLE) ×3 IMPLANT
SURGIFLO W/THROMBIN 8M KIT (HEMOSTASIS) ×3 IMPLANT
SUT BONE WAX W31G (SUTURE) ×3 IMPLANT
SUT ETHIBOND 2 0 SH (SUTURE) ×6
SUT ETHIBOND 2 0 SH 36X2 (SUTURE) ×3 IMPLANT
SUT MNCRL AB 4-0 PS2 18 (SUTURE) ×3 IMPLANT
SUT PROLENE 3 0 SH DA (SUTURE) ×6 IMPLANT
SUT PROLENE 3 0 SH1 36 (SUTURE) IMPLANT
SUT PROLENE 4 0 RB 1 (SUTURE) ×4
SUT PROLENE 4 0 SH DA (SUTURE) ×3 IMPLANT
SUT PROLENE 4-0 RB1 .5 CRCL 36 (SUTURE) ×2 IMPLANT
SUT PROLENE 5 0 C 1 36 (SUTURE) IMPLANT
SUT PROLENE 6 0 C 1 30 (SUTURE) ×6 IMPLANT
SUT PROLENE 6 0 CC (SUTURE) ×18 IMPLANT
SUT PROLENE 7 0 BV 1 (SUTURE) IMPLANT
SUT PROLENE 7 0 BV1 MDA (SUTURE) IMPLANT
SUT PROLENE 7 0 DA (SUTURE) IMPLANT
SUT PROLENE 7.0 RB 3 (SUTURE) ×12 IMPLANT
SUT PROLENE 8 0 BV175 6 (SUTURE) IMPLANT
SUT PROLENE BLUE 7 0 (SUTURE) ×12 IMPLANT
SUT PROLENE POLY MONO (SUTURE) IMPLANT
SUT SILK  1 MH (SUTURE)
SUT SILK 1 MH (SUTURE) IMPLANT
SUT SILK 2 0 SH CR/8 (SUTURE) ×3 IMPLANT
SUT SILK 3 0 SH CR/8 (SUTURE) IMPLANT
SUT STEEL 6MS V (SUTURE) ×6 IMPLANT
SUT STEEL STERNAL CCS#1 18IN (SUTURE) ×3 IMPLANT
SUT STEEL SZ 6 DBL 3X14 BALL (SUTURE) ×3 IMPLANT
SUT VIC AB 1 CTX 18 (SUTURE) IMPLANT
SUT VIC AB 1 CTX 36 (SUTURE) ×6
SUT VIC AB 1 CTX36XBRD ANBCTR (SUTURE) ×3 IMPLANT
SUT VIC AB 2-0 CT1 27 (SUTURE) ×4
SUT VIC AB 2-0 CT1 TAPERPNT 27 (SUTURE) ×2 IMPLANT
SUT VIC AB 2-0 CTX 27 (SUTURE) IMPLANT
SUT VIC AB 3-0 SH 27 (SUTURE)
SUT VIC AB 3-0 SH 27X BRD (SUTURE) IMPLANT
SUT VIC AB 3-0 X1 27 (SUTURE) IMPLANT
SUT VICRYL 4-0 PS2 18IN ABS (SUTURE) IMPLANT
SUTURE E-PAK OPEN HEART (SUTURE) ×3 IMPLANT
SYSTEM SAHARA CHEST DRAIN ATS (WOUND CARE) ×3 IMPLANT
TAPE CLOTH SURG 4X10 WHT LF (GAUZE/BANDAGES/DRESSINGS) ×6 IMPLANT
TOWEL OR 17X24 6PK STRL BLUE (TOWEL DISPOSABLE) ×6 IMPLANT
TOWEL OR 17X26 10 PK STRL BLUE (TOWEL DISPOSABLE) ×9 IMPLANT
TRAY FOLEY IC TEMP SENS 14FR (CATHETERS) ×3 IMPLANT
TUBING INSUFFLATION 10FT LAP (TUBING) ×3 IMPLANT
UNDERPAD 30X30 INCONTINENT (UNDERPADS AND DIAPERS) ×3 IMPLANT
WATER STERILE IRR 1000ML POUR (IV SOLUTION) ×6 IMPLANT

## 2011-10-24 NOTE — OR Nursing (Signed)
Time out with Dr. Maren Beach a [phone].

## 2011-10-24 NOTE — Progress Notes (Signed)
  Echocardiogram Echocardiogram Transesophageal has been performed.  Nathaniel Woods 10/24/2011, 9:21 AM

## 2011-10-24 NOTE — Anesthesia Preprocedure Evaluation (Addendum)
Anesthesia Evaluation  Patient identified by MRN, date of birth, ID band Patient awake    Reviewed: Allergy & Precautions, H&P , NPO status , Patient's Chart, lab work & pertinent test results  Airway Mallampati: III TM Distance: >3 FB Neck ROM: Full    Dental No notable dental hx. (+) Teeth Intact and Dental Advisory Given   Pulmonary neg pulmonary ROS,  breath sounds clear to auscultation  Pulmonary exam normal       Cardiovascular hypertension, On Medications, Pt. on medications and Pt. on home beta blockers + angina with exertion + CAD and + Past MI Rhythm:Regular Rate:Normal     Neuro/Psych CVA negative neurological ROS  negative psych ROS   GI/Hepatic negative GI ROS, Neg liver ROS, GERD-  Medicated,  Endo/Other  Diabetes mellitus-, Well Controlled, Type 2, Oral Hypoglycemic Agents  Renal/GU negative Renal ROS  negative genitourinary   Musculoskeletal   Abdominal   Peds  Hematology negative hematology ROS (+)   Anesthesia Other Findings   Reproductive/Obstetrics negative OB ROS                         Anesthesia Physical Anesthesia Plan  ASA: IV  Anesthesia Plan: General   Post-op Pain Management:    Induction: Intravenous  Airway Management Planned: Oral ETT  Additional Equipment: Arterial line, CVP, PA Cath and Ultrasound Guidance Line Placement  Intra-op Plan:   Post-operative Plan: Post-operative intubation/ventilation  Informed Consent: I have reviewed the patients History and Physical, chart, labs and discussed the procedure including the risks, benefits and alternatives for the proposed anesthesia with the patient or authorized representative who has indicated his/her understanding and acceptance.   Dental advisory given  Plan Discussed with: CRNA  Anesthesia Plan Comments:         Anesthesia Quick Evaluation

## 2011-10-24 NOTE — Brief Op Note (Signed)
10/22/2011 - 10/24/2011  12:00 PM  PATIENT:  Nathaniel Woods  78 y.o. male  PRE-OPERATIVE DIAGNOSIS:  CORONARY ARTERY DISEASE  POST-OPERATIVE DIAGNOSIS:  CORONARY ARTERY DISEASE  PROCEDURE:  CORONARY ARTERY BYPASS GRAFTING (CABG) x 4 (LIMA to LAD, SVG to Diagonal ,SVG to OM, and SVG to PDA) with EVH from right thigh and Left thigh and partial calf  SURGEON:  Surgeon(s) and Role:    * Kerin Perna, MD - Primary  PHYSICIAN ASSISTANT: Doree Fudge PA-C   ANESTHESIA:   general  EBL:  Total I/O In: 2000 [I.V.:2000] Out: 1050 [Urine:1050]  BLOOD ADMINISTERED:Two FFP and One PLTS  DRAINS:  Chest Tube(s) in the Mediastinal and Pleural spaces   COUNTS CORRECT:  YES  DICTATION: .Dragon Dictation  PLAN OF CARE: Admit to inpatient   PATIENT DISPOSITION:  ICU - intubated and hemodynamically stable.   Delay start of Pharmacological VTE agent (>24hrs) due to surgical blood loss or risk of bleeding: yes  PRE OP WEIGHT: 96 kg

## 2011-10-24 NOTE — Progress Notes (Signed)
The patient was examined and preop studies reviewed. There has been no change from the prior exam and the patient is ready for surgery.  Plan mutivessel CABG for unstable angina

## 2011-10-24 NOTE — Progress Notes (Signed)
ANTICOAGULATION CONSULT NOTE - Follow Up Consult  Pharmacy Consult for heaprin Indication: s/p MI - severe 3VCAD   Assessment: 78 yo seen in [place] on 5/12 with complaints of chest pain. He was transferred to [place] to be seen by his personal cardiologist there. He underwent cardiac cath which reveals 3V CAD and was transferred here to undergo a TCTS consult. He is to be started on IV heparin while awaiting CABG first case this AM. Heparin level this Am (0.29) was close to therapeutic range. Pt. Is ready for surgery.   Goal of Therapy:  Heparin level 0.3-0.7 units/ml Monitor platelets by anticoagulation protocol: Yes   Plan:  - Pharmacy will continue to follow up after surgery.   Thank you for asking pharmacy to be involved in the care of this patient.  Bayard Hugger, PharmD, BCPS  Clinical Pharmacist  Pager: [phone]    10/24/2011 8:24 AM

## 2011-10-24 NOTE — OR Nursing (Signed)
Second call to SICU at 1420

## 2011-10-24 NOTE — Procedures (Signed)
Extubation Procedure Note  Patient Details:   Name: Shell Yandow DOB: 09/15/1928 MRN: [id number]   Airway Documentation:   Pt extubated to 4L [place] tolerating well At this time. NIF -30. VC-1200 IS1000-1100 post extubation.  Evaluation  O2 sats: stable throughout Complications: No apparent complications Patient did tolerate procedure well. Bilateral Breath Sounds: Diminished   Yes  Augustine Radar 10/24/2011, 8:35 PM

## 2011-10-24 NOTE — Transfer of Care (Signed)
Immediate Anesthesia Transfer of Care Note  Patient: Nathaniel Woods  Procedure(s) Performed: Procedure(s) (LRB): CORONARY ARTERY BYPASS GRAFTING (CABG) (N/A)  Patient Location: PACU  Anesthesia Type: General  Level of Consciousness: sedated and unresponsive  Airway & Oxygen Therapy: Patient remains intubated per anesthesia plan and Patient placed on Ventilator (see vital sign flow sheet for setting)  Post-op Assessment: Report given to PACU RN  Post vital signs: Reviewed and stable  Complications: No apparent anesthesia complications

## 2011-10-24 NOTE — Preoperative (Addendum)
Beta Blockers  Tenormin 50mg  PO @   11:44 on 10/23/11  HR 48-55 this am

## 2011-10-24 NOTE — Anesthesia Procedure Notes (Addendum)
Procedure Name: Intubation Date/Time: 10/24/2011 7:57 AM Performed by: Tyrone Nine Pre-anesthesia Checklist: Patient being monitored, Suction available, Emergency Drugs available, Patient identified and Timeout performed Patient Re-evaluated:Patient Re-evaluated prior to inductionOxygen Delivery Method: Circle system utilized Preoxygenation: Pre-oxygenation with 100% oxygen Intubation Type: IV induction Ventilation: Mask ventilation with difficulty Laryngoscope Size: Mac and 3 Grade View: Grade I Tube type: Oral Tube size: 8.0 mm Number of attempts: 1 Airway Equipment and Method: Stylet Placement Confirmation: ETT inserted through vocal cords under direct vision,  positive ETCO2 and breath sounds checked- equal and bilateral Secured at: 22 cm Tube secured with: Tape Dental Injury: Teeth and Oropharynx as per pre-operative assessment    Performed by: Tyrone Nine

## 2011-10-24 NOTE — Progress Notes (Signed)
TCTS BRIEF SICU PROGRESS NOTE  Day of Surgery  S/P Procedure(s) (LRB): CORONARY ARTERY BYPASS GRAFTING (CABG) (N/A)   Sedated on vent AAI paced, BP stable Chest tube output < 100 mL/hr UOP > 100 mL/hr  Plan: Continue routine early postop  Nathaniel Woods H 10/24/2011 7:56 PM

## 2011-10-25 ENCOUNTER — Inpatient Hospital Stay (HOSPITAL_COMMUNITY): Payer: Medicare HMO

## 2011-10-25 LAB — POCT I-STAT, CHEM 8
BUN: 12 mg/dL (ref 6–23)
Calcium, Ion: 1.04 mmol/L — ABNORMAL LOW (ref 1.12–1.32)
Chloride: 104 mEq/L (ref 96–112)
Creatinine, Ser: 1.2 mg/dL (ref 0.50–1.35)
Glucose, Bld: 114 mg/dL — ABNORMAL HIGH (ref 70–99)
HCT: 22 % — ABNORMAL LOW (ref 39.0–52.0)
Hemoglobin: 7.5 g/dL — ABNORMAL LOW (ref 13.0–17.0)
Potassium: 3.7 mEq/L (ref 3.5–5.1)
Sodium: 135 mEq/L (ref 135–145)
TCO2: 19 mmol/L (ref 0–100)

## 2011-10-25 LAB — PREPARE PLATELET PHERESIS
Unit division: 0
Unit division: 0

## 2011-10-25 LAB — GLUCOSE, CAPILLARY
Glucose-Capillary: 107 mg/dL — ABNORMAL HIGH (ref 70–99)
Glucose-Capillary: 108 mg/dL — ABNORMAL HIGH (ref 70–99)
Glucose-Capillary: 124 mg/dL — ABNORMAL HIGH (ref 70–99)
Glucose-Capillary: 127 mg/dL — ABNORMAL HIGH (ref 70–99)
Glucose-Capillary: 142 mg/dL — ABNORMAL HIGH (ref 70–99)
Glucose-Capillary: 145 mg/dL — ABNORMAL HIGH (ref 70–99)
Glucose-Capillary: 155 mg/dL — ABNORMAL HIGH (ref 70–99)
Glucose-Capillary: 158 mg/dL — ABNORMAL HIGH (ref 70–99)
Glucose-Capillary: 173 mg/dL — ABNORMAL HIGH (ref 70–99)
Glucose-Capillary: 95 mg/dL (ref 70–99)
Glucose-Capillary: 95 mg/dL (ref 70–99)
Glucose-Capillary: 117 mg/dL — ABNORMAL HIGH (ref 70–99)
Glucose-Capillary: 102 mg/dL — ABNORMAL HIGH (ref 70–99)
Glucose-Capillary: 128 mg/dL — ABNORMAL HIGH (ref 70–99)

## 2011-10-25 LAB — CBC
MCH: 35 pg — ABNORMAL HIGH (ref 26.0–34.0)
Platelets: 146 10*3/uL — ABNORMAL LOW (ref 150–400)
Platelets: 122 10*3/uL — ABNORMAL LOW (ref 150–400)
RBC: 2.4 MIL/uL — ABNORMAL LOW (ref 4.22–5.81)
RDW: 13.1 % (ref 11.5–15.5)
WBC: 10.9 10*3/uL — ABNORMAL HIGH (ref 4.0–10.5)
WBC: 13.3 10*3/uL — ABNORMAL HIGH (ref 4.0–10.5)

## 2011-10-25 LAB — PREPARE FRESH FROZEN PLASMA: Unit division: 0

## 2011-10-25 LAB — BASIC METABOLIC PANEL
CO2: 21 mEq/L (ref 19–32)
Calcium: 7.5 mg/dL — ABNORMAL LOW (ref 8.4–10.5)
Chloride: 106 mEq/L (ref 96–112)
Potassium: 4.2 mEq/L (ref 3.5–5.1)
Sodium: 137 mEq/L (ref 135–145)

## 2011-10-25 LAB — MAGNESIUM
Magnesium: 2.8 mg/dL — ABNORMAL HIGH (ref 1.5–2.5)
Magnesium: 2.6 mg/dL — ABNORMAL HIGH (ref 1.5–2.5)

## 2011-10-25 LAB — CREATININE, SERUM
GFR calc Af Amer: 63 mL/min — ABNORMAL LOW (ref >90)
GFR calc non Af Amer: 54 mL/min — ABNORMAL LOW (ref >90)

## 2011-10-25 MED ORDER — POTASSIUM CHLORIDE 10 MEQ/50ML IV SOLN
INTRAVENOUS | Status: AC
  Administered 2011-10-25: 10 meq via INTRAVENOUS
  Filled 2011-10-25: qty 150

## 2011-10-25 MED ORDER — INSULIN GLARGINE 100 UNIT/ML ~~LOC~~ SOLN
20.0000 [IU] | Freq: Two times a day (BID) | SUBCUTANEOUS | Status: DC
  Administered 2011-10-25: 20 [IU] via SUBCUTANEOUS

## 2011-10-25 MED ORDER — INSULIN ASPART 100 UNIT/ML ~~LOC~~ SOLN: 0.0000 [IU] | SUBCUTANEOUS | Status: DC

## 2011-10-25 MED ORDER — POTASSIUM CHLORIDE 10 MEQ/50ML IV SOLN
10.0000 meq | INTRAVENOUS | Status: AC
  Administered 2011-10-25 (×3): 10 meq via INTRAVENOUS

## 2011-10-25 NOTE — Progress Notes (Signed)
   CARDIOTHORACIC SURGERY PROGRESS NOTE   R1 Day Post-Op Procedure(s) (LRB): CORONARY ARTERY BYPASS GRAFTING (CABG) (N/A)  Subjective: Feels well.  Denies pain, SOB.  No complaints.  Objective: Vital signs: BP Readings from Last 1 Encounters:  10/25/11 130/58   Pulse Readings from Last 1 Encounters:  10/25/11 91   Resp Readings from Last 1 Encounters:  10/25/11 23   Temp Readings from Last 1 Encounters:  10/25/11 99.9 F (37.7 C)     Hemodynamics: PAP: (31-44)/(12-23) 42/16 mmHg CO:  [3.9 L/min-7 L/min] 7 L/min CI:  [1.7 L/min/m2-3.1 L/min/m2] 3.1 L/min/m2  Physical Exam:  Rhythm:   Junctional, DDD paced  Breath sounds: clear  Heart sounds:  RRR  Incisions:  Dressings dry  Abdomen:  soft  Extremities:  Warm, well perfused   Intake/Output from previous day: 05/17 0701 - 05/18 0700 In: 8267.3 [P.O.:100; I.V.:4941.3; ZOXWR:6045; IV Piggyback:1250] Out: 8164 [Urine:4870; Emesis/NG output:100; Blood:2319; Chest Tube:875] Intake/Output this shift: Total I/O In: 690.1 [P.O.:240; I.V.:450.1] Out: 340 [Urine:260; Chest Tube:80]  Lab Results:  [facility] 10/25/11 0425 10/24/11 2053 10/24/11 2042  WBC 10.9* -- 9.7  HGB 8.4* 7.1* --  HCT 23.9* 21.0* --  PLT 146* -- 119*   BMET:  Basename 10/25/11 0425 10/24/11 2053 10/23/11 0603  NA 137 141 --  K 4.2 3.7 --  CL 106 109 --  CO2 21 -- 22  GLUCOSE 110* 163* --  BUN 10 7 --  CREATININE 0.82 0.80 --  CALCIUM 7.5* -- 9.1    CBG (last 3)   Basename 10/25/11 1148 10/25/11 0908 10/25/11 0753  GLUCAP 128* 102* 117*   ABG    Component Value Date/Time   PHART 7.383 10/24/2011 2130   HCO3 21.9 10/24/2011 2130   TCO2 23 10/24/2011 2130   ACIDBASEDEF 3.0* 10/24/2011 2130   O2SAT 97.0 10/24/2011 2130   CXR: Looks good  Assessment/Plan: S/P Procedure(s) (LRB): CORONARY ARTERY BYPASS GRAFTING (CABG) (N/A)  Doing well POD1 although still on low dose Neo and Dopamine Expected post op acute blood loss anemia, mild,  stable Expected post op volume excess, mild Type II diabetes mellitus, glycemic control excellent on insulin drip   Wean drips as tolerated  Mobilize  Hold diuretics until BP stable off Neo  Start lantus insulin   Mirra Basilio H 10/25/2011 2:58 PM

## 2011-10-26 ENCOUNTER — Inpatient Hospital Stay (HOSPITAL_COMMUNITY): Payer: Medicare HMO

## 2011-10-26 LAB — GLUCOSE, CAPILLARY
Glucose-Capillary: 101 mg/dL — ABNORMAL HIGH (ref 70–99)
Glucose-Capillary: 102 mg/dL — ABNORMAL HIGH (ref 70–99)
Glucose-Capillary: 103 mg/dL — ABNORMAL HIGH (ref 70–99)
Glucose-Capillary: 104 mg/dL — ABNORMAL HIGH (ref 70–99)
Glucose-Capillary: 104 mg/dL — ABNORMAL HIGH (ref 70–99)
Glucose-Capillary: 107 mg/dL — ABNORMAL HIGH (ref 70–99)
Glucose-Capillary: 107 mg/dL — ABNORMAL HIGH (ref 70–99)
Glucose-Capillary: 107 mg/dL — ABNORMAL HIGH (ref 70–99)
Glucose-Capillary: 111 mg/dL — ABNORMAL HIGH (ref 70–99)
Glucose-Capillary: 111 mg/dL — ABNORMAL HIGH (ref 70–99)
Glucose-Capillary: 112 mg/dL — ABNORMAL HIGH (ref 70–99)
Glucose-Capillary: 114 mg/dL — ABNORMAL HIGH (ref 70–99)
Glucose-Capillary: 118 mg/dL — ABNORMAL HIGH (ref 70–99)
Glucose-Capillary: 123 mg/dL — ABNORMAL HIGH (ref 70–99)
Glucose-Capillary: 124 mg/dL — ABNORMAL HIGH (ref 70–99)
Glucose-Capillary: 125 mg/dL — ABNORMAL HIGH (ref 70–99)
Glucose-Capillary: 125 mg/dL — ABNORMAL HIGH (ref 70–99)
Glucose-Capillary: 127 mg/dL — ABNORMAL HIGH (ref 70–99)
Glucose-Capillary: 141 mg/dL — ABNORMAL HIGH (ref 70–99)
Glucose-Capillary: 156 mg/dL — ABNORMAL HIGH (ref 70–99)
Glucose-Capillary: 157 mg/dL — ABNORMAL HIGH (ref 70–99)
Glucose-Capillary: 173 mg/dL — ABNORMAL HIGH (ref 70–99)
Glucose-Capillary: 176 mg/dL — ABNORMAL HIGH (ref 70–99)
Glucose-Capillary: 225 mg/dL — ABNORMAL HIGH (ref 70–99)
Glucose-Capillary: 243 mg/dL — ABNORMAL HIGH (ref 70–99)
Glucose-Capillary: 69 mg/dL — ABNORMAL LOW (ref 70–99)
Glucose-Capillary: 89 mg/dL (ref 70–99)
Glucose-Capillary: 92 mg/dL (ref 70–99)
Glucose-Capillary: 95 mg/dL (ref 70–99)
Glucose-Capillary: 96 mg/dL (ref 70–99)
Glucose-Capillary: 99 mg/dL (ref 70–99)
Glucose-Capillary: 99 mg/dL (ref 70–99)

## 2011-10-26 LAB — TYPE AND SCREEN
ABO/RH(D): A NEG
Antibody Screen: NEGATIVE
Unit division: 0
Unit division: 0

## 2011-10-26 LAB — BASIC METABOLIC PANEL
BUN: 18 mg/dL (ref 6–23)
Calcium: 7.6 mg/dL — ABNORMAL LOW (ref 8.4–10.5)
Creatinine, Ser: 1.35 mg/dL (ref 0.50–1.35)
GFR calc Af Amer: 55 mL/min — ABNORMAL LOW (ref >90)
GFR calc non Af Amer: 48 mL/min — ABNORMAL LOW (ref >90)
Glucose, Bld: 109 mg/dL — ABNORMAL HIGH (ref 70–99)

## 2011-10-26 LAB — PREPARE RBC (CROSSMATCH)

## 2011-10-26 LAB — CBC
Hemoglobin: 7.2 g/dL — ABNORMAL LOW (ref 13.0–17.0)
MCH: 35 pg — ABNORMAL HIGH (ref 26.0–34.0)
MCHC: 35.5 g/dL (ref 30.0–36.0)
RDW: 13.3 % (ref 11.5–15.5)

## 2011-10-26 MED ORDER — FUROSEMIDE 10 MG/ML IJ SOLN
20.0000 mg | Freq: Once | INTRAMUSCULAR | Status: AC
  Administered 2011-10-26: 20 mg via INTRAVENOUS
  Filled 2011-10-26: qty 2

## 2011-10-26 MED ORDER — DEXTROSE 50 % IV SOLN
INTRAVENOUS | Status: AC
  Administered 2011-10-26: 25 mL
  Filled 2011-10-26: qty 50

## 2011-10-26 MED ORDER — POTASSIUM CHLORIDE 10 MEQ/50ML IV SOLN
INTRAVENOUS | Status: AC
  Filled 2011-10-26: qty 150

## 2011-10-26 MED ORDER — INSULIN GLARGINE 100 UNIT/ML ~~LOC~~ SOLN: 20.0000 [IU] | Freq: Two times a day (BID) | SUBCUTANEOUS | Status: DC

## 2011-10-26 MED ORDER — INSULIN ASPART 100 UNIT/ML ~~LOC~~ SOLN: 0.0000 [IU] | SUBCUTANEOUS | Status: DC

## 2011-10-26 MED ORDER — CHLORHEXIDINE GLUCONATE CLOTH 2 % EX PADS
6.0000 | MEDICATED_PAD | Freq: Every day | CUTANEOUS | Status: AC
  Administered 2011-10-26 – 2011-10-30 (×5): 6 via TOPICAL

## 2011-10-26 MED ORDER — INSULIN ASPART 100 UNIT/ML ~~LOC~~ SOLN
0.0000 [IU] | SUBCUTANEOUS | Status: AC
  Administered 2011-10-26: 2 [IU] via SUBCUTANEOUS
  Administered 2011-10-26: 4 [IU] via SUBCUTANEOUS

## 2011-10-26 MED ORDER — INSULIN GLARGINE 100 UNIT/ML ~~LOC~~ SOLN
28.0000 [IU] | Freq: Two times a day (BID) | SUBCUTANEOUS | Status: DC
  Administered 2011-10-26 – 2011-10-27 (×4): 28 [IU] via SUBCUTANEOUS

## 2011-10-26 MED ORDER — POTASSIUM CHLORIDE 10 MEQ/50ML IV SOLN
10.0000 meq | INTRAVENOUS | Status: AC
  Administered 2011-10-26 (×3): 10 meq via INTRAVENOUS

## 2011-10-26 NOTE — Progress Notes (Signed)
TCTS BRIEF SICU PROGRESS NOTE  2 Days Post-Op  S/P Procedure(s) (LRB): CORONARY ARTERY BYPASS GRAFTING (CABG) (N/A)   Feels much better this evening Almost off of Neo drip UOP excellent  Plan: Continue current plan  Myleen Brailsford H 10/26/2011 6:08 PM

## 2011-10-26 NOTE — Progress Notes (Addendum)
   CARDIOTHORACIC SURGERY PROGRESS NOTE   R2 Days Post-Op Procedure(s) (LRB): CORONARY ARTERY BYPASS GRAFTING (CABG) (N/A)  Subjective: Doesn't feel well today.  Nauseated and dizzy with attempts to stand up.  Still on Neo and Dopamine drips for BP.   Objective: Vital signs: BP Readings from Last 1 Encounters:  10/26/11 110/51   Pulse Readings from Last 1 Encounters:  10/26/11 88   Resp Readings from Last 1 Encounters:  10/26/11 26   Temp Readings from Last 1 Encounters:  10/26/11 99 F (37.2 C) Oral    Hemodynamics: PAP: (30-56)/(12-26) 38/18 mmHg CO:  [7 L/min] 7 L/min CI:  [3.1 L/min/m2] 3.1 L/min/m2  Physical Exam:  Rhythm:   Regular, looks junctional, but will not Atrial pace - DDD paced  Breath sounds: Bibasilar crackles  Heart sounds:  RRR  Incisions:  Dressings dry  Abdomen:  Soft, non tender  Extremities:  Warm, adequately perfused   Intake/Output from previous day: 05/18 0701 - 05/19 0700 In: 2670.9 [P.O.:460; I.V.:2010.9; IV Piggyback:200] Out: 1010 [Urine:830; Chest Tube:180] Intake/Output this shift: Total I/O In: 78.7 [I.V.:26.7; IV Piggyback:52] Out: -   Lab Results:  Basename 10/26/11 0453 10/25/11 1700  WBC 13.0* 13.3*  HGB 7.2* 7.5*  HCT 20.3* 21.2*  PLT 114* 122*   BMET:  Basename 10/26/11 0453 10/25/11 1700 10/25/11 1654 10/25/11 0425  NA 128* -- 135 --  K 3.7 -- 3.7 --  CL 98 -- 104 --  CO2 20 -- -- 21  GLUCOSE 109* -- 114* --  BUN 18 -- 12 --  CREATININE 1.35 1.21 -- --  CALCIUM 7.6* -- -- 7.5*    CBG (last 3)   Basename 10/25/11 1556 10/25/11 1454 10/25/11 1352  GLUCAP 99 102* 141*   ABG    Component Value Date/Time   PHART 7.383 10/24/2011 2130   HCO3 21.9 10/24/2011 2130   TCO2 19 10/25/2011 1654   ACIDBASEDEF 3.0* 10/24/2011 2130   O2SAT 97.0 10/24/2011 2130   CXR: Slight increase LLL atelectasis, LLL airspace opacity  Assessment/Plan: S/P Procedure(s) (LRB): CORONARY ARTERY BYPASS GRAFTING (CABG)  (N/A)  Expected post op acute blood loss anemia, worse, now symptomatic Expected post op volume excess, moderate Vasodilated, still on dopamine and neo drips Type II diabetes mellitus, excellent glycemic control but back on insulin drip Postop atelectasis Intraoperative atrial fibrillation, pacer dependent at present on amiodarone drip   Transfuse 2 units PRBCs  Wean drips as tolerated  Increase lantus insulin  Mobilize, pulm toilet  Will d/c amiodarone drip, continue DDD pacing for now     Bonney Berres H 10/26/2011 8:48 AM

## 2011-10-27 ENCOUNTER — Inpatient Hospital Stay (HOSPITAL_COMMUNITY): Payer: Medicare HMO

## 2011-10-27 ENCOUNTER — Encounter (HOSPITAL_COMMUNITY): Payer: Self-pay | Admitting: Cardiothoracic Surgery

## 2011-10-27 DIAGNOSIS — IMO0001 Reserved for inherently not codable concepts without codable children: Secondary | ICD-10-CM

## 2011-10-27 DIAGNOSIS — E1165 Type 2 diabetes mellitus with hyperglycemia: Secondary | ICD-10-CM

## 2011-10-27 LAB — TYPE AND SCREEN: Unit division: 0

## 2011-10-27 LAB — GLUCOSE, CAPILLARY
Glucose-Capillary: 71 mg/dL (ref 70–99)
Glucose-Capillary: 76 mg/dL (ref 70–99)
Glucose-Capillary: 70 mg/dL (ref 70–99)
Glucose-Capillary: 96 mg/dL (ref 70–99)
Glucose-Capillary: 99 mg/dL (ref 70–99)
Glucose-Capillary: 95 mg/dL (ref 70–99)
Glucose-Capillary: 117 mg/dL — ABNORMAL HIGH (ref 70–99)

## 2011-10-27 LAB — BASIC METABOLIC PANEL
BUN: 18 mg/dL (ref 6–23)
CO2: 22 mEq/L (ref 19–32)
Chloride: 99 mEq/L (ref 96–112)
GFR calc Af Amer: >90 mL/min (ref >90)
Potassium: 4 mEq/L (ref 3.5–5.1)

## 2011-10-27 LAB — CBC
HCT: 24 % — ABNORMAL LOW (ref 39.0–52.0)
Hemoglobin: 8.5 g/dL — ABNORMAL LOW (ref 13.0–17.0)
MCHC: 35.4 g/dL (ref 30.0–36.0)
MCV: 94.5 fL (ref 78.0–100.0)

## 2011-10-27 MED ORDER — PANTOPRAZOLE SODIUM 40 MG PO TBEC: 40.0000 mg | DELAYED_RELEASE_TABLET | Freq: Every day | ORAL | Status: DC

## 2011-10-27 MED ORDER — DOCUSATE SODIUM 100 MG PO CAPS
200.0000 mg | ORAL_CAPSULE | Freq: Every day | ORAL | Status: DC
  Administered 2011-10-31: 200 mg via ORAL
  Filled 2011-10-27 (×7): qty 2

## 2011-10-27 MED ORDER — MOVING RIGHT ALONG BOOK
Freq: Once | Status: AC
  Administered 2011-10-27: 23:00:00
  Filled 2011-10-27: qty 1

## 2011-10-27 MED ORDER — FUROSEMIDE 10 MG/ML IJ SOLN
20.0000 mg | Freq: Once | INTRAMUSCULAR | Status: AC
  Administered 2011-10-28: 20 mg via INTRAVENOUS
  Filled 2011-10-27: qty 2

## 2011-10-27 MED ORDER — METOPROLOL TARTRATE 12.5 MG HALF TABLET: 12.5000 mg | ORAL_TABLET | Freq: Two times a day (BID) | ORAL | Status: DC

## 2011-10-27 MED ORDER — METOPROLOL TARTRATE 12.5 MG HALF TABLET
12.5000 mg | ORAL_TABLET | Freq: Two times a day (BID) | ORAL | Status: DC
  Administered 2011-10-27 – 2011-10-31 (×8): 12.5 mg via ORAL
  Filled 2011-10-27 (×9): qty 1

## 2011-10-27 MED ORDER — ATORVASTATIN CALCIUM 40 MG PO TABS: 40.0000 mg | ORAL_TABLET | Freq: Every day | ORAL | Status: DC

## 2011-10-27 MED ORDER — POTASSIUM CHLORIDE CRYS ER 10 MEQ PO TBCR: 10.0000 meq | EXTENDED_RELEASE_TABLET | Freq: Every day | ORAL | Status: DC

## 2011-10-27 MED ORDER — SODIUM CHLORIDE 0.9 % IJ SOLN: 3.0000 mL | INTRAMUSCULAR | Status: DC | PRN

## 2011-10-27 MED ORDER — SODIUM CHLORIDE 0.9 % IV SOLN: 250.0000 mL | INTRAVENOUS | Status: DC | PRN

## 2011-10-27 MED ORDER — INSULIN ASPART 100 UNIT/ML ~~LOC~~ SOLN: 0.0000 [IU] | Freq: Three times a day (TID) | SUBCUTANEOUS | Status: DC

## 2011-10-27 MED ORDER — ONDANSETRON HCL 4 MG/2ML IJ SOLN: 4.0000 mg | Freq: Four times a day (QID) | INTRAMUSCULAR | Status: DC | PRN

## 2011-10-27 MED ORDER — GUAIFENESIN-DM 100-10 MG/5ML PO SYRP: 15.0000 mL | ORAL_SOLUTION | ORAL | Status: DC | PRN

## 2011-10-27 MED ORDER — POTASSIUM CHLORIDE CRYS ER 20 MEQ PO TBCR
20.0000 meq | EXTENDED_RELEASE_TABLET | Freq: Every day | ORAL | Status: DC
  Administered 2011-10-28 – 2011-11-03 (×7): 20 meq via ORAL
  Filled 2011-10-27 (×7): qty 1

## 2011-10-27 MED ORDER — POLYSACCHARIDE IRON COMPLEX 150 MG PO CAPS
150.0000 mg | ORAL_CAPSULE | Freq: Every day | ORAL | Status: DC
  Administered 2011-10-27: 150 mg via ORAL
  Filled 2011-10-27: qty 1

## 2011-10-27 MED ORDER — ONDANSETRON HCL 4 MG PO TABS: 4.0000 mg | ORAL_TABLET | Freq: Four times a day (QID) | ORAL | Status: DC | PRN

## 2011-10-27 MED ORDER — DOCUSATE SODIUM 100 MG PO CAPS: 200.0000 mg | ORAL_CAPSULE | Freq: Every day | ORAL | Status: DC

## 2011-10-27 MED ORDER — FUROSEMIDE 40 MG PO TABS
40.0000 mg | ORAL_TABLET | Freq: Every day | ORAL | Status: DC
  Filled 2011-10-27: qty 1

## 2011-10-27 MED ORDER — BISACODYL 10 MG RE SUPP: 10.0000 mg | Freq: Every day | RECTAL | Status: DC | PRN

## 2011-10-27 MED ORDER — SODIUM CHLORIDE 0.9 % IJ SOLN: 3.0000 mL | Freq: Two times a day (BID) | INTRAMUSCULAR | Status: DC

## 2011-10-27 MED ORDER — MAGNESIUM HYDROXIDE 400 MG/5ML PO SUSP: 30.0000 mL | Freq: Every day | ORAL | Status: DC | PRN

## 2011-10-27 MED ORDER — BISACODYL 5 MG PO TBEC: 10.0000 mg | DELAYED_RELEASE_TABLET | Freq: Every day | ORAL | Status: DC | PRN

## 2011-10-27 MED ORDER — ACETAMINOPHEN 325 MG PO TABS: 650.0000 mg | ORAL_TABLET | Freq: Four times a day (QID) | ORAL | Status: DC | PRN

## 2011-10-27 MED ORDER — TRAMADOL HCL 50 MG PO TABS: 50.0000 mg | ORAL_TABLET | ORAL | Status: DC | PRN

## 2011-10-27 MED ORDER — PANTOPRAZOLE SODIUM 40 MG PO TBEC
40.0000 mg | DELAYED_RELEASE_TABLET | Freq: Every day | ORAL | Status: DC
  Administered 2011-10-28 – 2011-11-03 (×7): 40 mg via ORAL
  Filled 2011-10-27 (×8): qty 1

## 2011-10-27 MED ORDER — INSULIN ASPART 100 UNIT/ML ~~LOC~~ SOLN
0.0000 [IU] | Freq: Three times a day (TID) | SUBCUTANEOUS | Status: DC
  Administered 2011-10-30: 4 [IU] via SUBCUTANEOUS
  Administered 2011-10-30 – 2011-11-02 (×11): 2 [IU] via SUBCUTANEOUS

## 2011-10-27 MED ORDER — ALUM & MAG HYDROXIDE-SIMETH 200-200-20 MG/5ML PO SUSP: 15.0000 mL | ORAL | Status: DC | PRN

## 2011-10-27 MED ORDER — POLYSACCHARIDE IRON COMPLEX 150 MG PO CAPS
150.0000 mg | ORAL_CAPSULE | Freq: Every day | ORAL | Status: DC
  Administered 2011-10-28 – 2011-11-03 (×7): 150 mg via ORAL
  Filled 2011-10-27 (×7): qty 1

## 2011-10-27 MED ORDER — MOVING RIGHT ALONG BOOK
Freq: Once | Status: DC
  Filled 2011-10-27: qty 1

## 2011-10-27 MED ORDER — FUROSEMIDE 10 MG/ML IJ SOLN: 20.0000 mg | Freq: Once | INTRAMUSCULAR | Status: DC

## 2011-10-27 MED ORDER — TRAMADOL HCL 50 MG PO TABS
50.0000 mg | ORAL_TABLET | Freq: Four times a day (QID) | ORAL | Status: DC | PRN
  Administered 2011-10-31: 50 mg via ORAL
  Filled 2011-10-27: qty 1

## 2011-10-27 MED ORDER — ACETAMINOPHEN 325 MG PO TABS
650.0000 mg | ORAL_TABLET | Freq: Four times a day (QID) | ORAL | Status: DC | PRN
  Administered 2011-11-02: 650 mg via ORAL
  Filled 2011-10-27: qty 2

## 2011-10-27 MED FILL — Dexmedetomidine HCl IV Soln 200 MCG/2ML: INTRAVENOUS | Qty: 2 | Status: AC

## 2011-10-27 MED FILL — Potassium Chloride Inj 2 mEq/ML: INTRAVENOUS | Qty: 40 | Status: AC

## 2011-10-27 MED FILL — Magnesium Sulfate Inj 50%: INTRAMUSCULAR | Qty: 10 | Status: AC

## 2011-10-27 NOTE — Progress Notes (Signed)
3 Days Post-Op Procedure(s) (LRB): CORONARY ARTERY BYPASS GRAFTING (CABG) (N/A)                     [address]            [place] [address]          [phone]      Subjective: S/p CABG for 3V CAD,unstable angina walked 100 feet Slow a-fib, req temp pacer Vital signs in last 24 hours: Temp:  [97.9 F (36.6 C)-98.9 F (37.2 C)] 97.9 F (36.6 C) (05/20 0734) Pulse Rate:  [90-112] 98  (05/20 0800) Cardiac Rhythm:  [-] A-V Sequential paced (05/20 0800) Resp:  [17-26] 23  (05/20 0800) BP: (88-121)/(47-72) 108/60 mmHg (05/20 0800) SpO2:  [93 %-99 %] 98 % (05/20 0800) Arterial Line BP: (90-158)/(39-75) 97/47 mmHg (05/20 0800) Weight:  [236 lb 1.8 oz (107.1 kg)] 236 lb 1.8 oz (107.1 kg) (05/20 0600)  Hemodynamic parameters for last 24 hours:  paced , off dopamine  Intake/Output from previous day: 05/19 0701 - 05/20 0700 In: 2847.1 [P.O.:960; I.V.:1091.4; Blood:641.7; IV Piggyback:154] Out: 1615 [Urine:1615] Intake/Output this shift: Total I/O In: 20 [I.V.:20] Out: 100 [Urine:100]  EXAM Neuro intact Mild edema Incisions clean  Lab Results:  Basename 10/27/11 0400 10/26/11 0453  WBC 11.3* 13.0*  HGB 8.5* 7.2*  HCT 24.0* 20.3*  PLT 108* 114*   BMET:  Basename 10/27/11 0400 10/26/11 0453  NA 128* 128*  K 4.0 3.7  CL 99 98  CO2 22 20  GLUCOSE 86 109*  BUN 18 18  CREATININE 0.78 1.35  CALCIUM 7.5* 7.6*    PT/INR:  Basename 10/24/11 1506  LABPROT 17.4*  INR 1.40   ABG    Component Value Date/Time   PHART 7.383 10/24/2011 2130   HCO3 21.9 10/24/2011 2130   TCO2 19 10/25/2011 1654   ACIDBASEDEF 3.0* 10/24/2011 2130   O2SAT 97.0 10/24/2011 2130   CBG (last 3)   Basename 10/27/11 0732 10/27/11 0355 10/27/11 0102  GLUCAP 70 76 71    Assessment/Plan: S/P Procedure(s) (LRB): CORONARY ARTERY BYPASS GRAFTING (CABG) (N/A) Plan for transfer to step-down: see transfer orders hold coumadin due to anemia, bleeding diathesis, add Niferex  LOS: 5  days    Nathaniel Woods,Nathaniel Woods 10/27/2011

## 2011-10-27 NOTE — Op Note (Signed)
NAMESURYA, SCHROETER NO.:  [id number]  MEDICAL RECORD NO.:  [id number]  LOCATION:                                 FACILITY:  PHYSICIAN:  Kerin Perna, M.D.  DATE OF BIRTH:  10/30/1928  DATE OF PROCEDURE:  10/24/2011 DATE OF DISCHARGE:                              OPERATIVE REPORT   OPERATION: 1. Coronary artery bypass grafting x4 (left internal mammary artery to     LAD, saphenous vein graft to diagonal, saphenous vein graft to     obtuse marginal, saphenous vein graft to posterior descending) 2. Endoscopic harvest of bilateral greater saphenous vein.  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  SURGEON:  Kerin Perna, M.D.  ASSISTANT:  Doree Fudge, PA-C.  ANESTHESIA:  General by Dr. Dr. Autumn Patty.  INDICATIONS:  The patient is an 78 year old Caucasian male, nondiabetic, with a history of coronary artery disease and previous percutaneous intervention of his LAD, diagonal, and RCA.  He returned to the hospital with symptoms of unstable angina and was found to have positive cardiac enzymes.  He was admitted and placed on IV heparin.  Cardiac catheterization demonstrated in-stent stenosis of his LAD, diagonal, and RCA with total occluded RCA, which was chronic.  EF was 40% with inferior wall hypokinesia.  No significant valvular disease by transthoracic 2D echocardiogram.  He had high-grade LAD diagonal stenosis with graftable targets and was felt to be candidate for surgical revascularization.  I discussed the procedure of coronary artery bypass grafting for treatment of his coronary artery disease with the patient and his family.   I discussed the indications, benefits, alternatives, and expected postoperative course.  I discussed with the patient the major aspects of the operation including the use of general anesthesia, the location of the surgical incisions, the use of cardiopulmonary bypass, and expected postoperative recovery.  I discussed with the patient of risks to him of coronary bypass surgery including risks of MI, stroke, bleeding, blood transfusion requirement, infection, and death.  After reviewing of these issues, he demonstrated his understanding and agreed to proceed with the surgery under what I felt was an informed consent.  OPERATIVE FINDINGS: 1. Bilateral leg vein harvest required due to small sclerotic vein in     the left leg, which was initially examined. 2. Intraoperative coagulopathy with a platelet count of 70,000,     requiring platelet transfusion, following separation from     cardiopulmonary bypass and reversal of heparin with protamine. 3. Evidence of apical hypokinesia and inferior wall akinesia from old     myocardial scarring. 4. Improved global LV function following surgical revascularization     and separation from cardiopulmonary bypass by TEE.  PROCEDURE:  The patient was brought to the operating room and placed supine on the operating room table, where general anesthesia was induced.  The chest, abdomen, and legs were prepped with Betadine and draped as a sterile field.  A proper time-out was performed.  The patient  had a TEE placed by the anesthesiologist.  This showed some inferior wall hypokinesia, apical hypokinesia, and no significant valvular disease.  A sternal incision was made as the saphenous vein was harvested endoscopically from both legs.  The left internal mammary artery was harvested as a pedicle graft from its origin at the subclavian vessels.  It had excellent flow.  The sternal retractor was placed using the deep blades due to the patient's obese body  habitus. The pericardium was opened and suspended.  Heparin was administered after the vein was harvested and pursestrings were placed in the right atrium and ascending aorta.  The patient was cannulated and placed on cardiopulmonary bypass.  It should be noted that patient's ascending aorta was uncoiled and had a significant shift to the right and was under the sternum and difficult to visualize.  The coronaries were identified for grafting.  The mammary artery and vein grafts were prepared for the distal anastomoses.  Cardioplegic cannulas were placed for both antegrade and retrograde cold blood cardioplegia, and the patient was cooled to 32 degrees.  Aortic cross- clamp was applied and 1 L of cold blood cardioplegia was delivered in split doses between the antegrade aortic and retrograde coronary sinus catheters.  There was good cardioplegic arrest and septal temperature dropped less than 15 degrees.  Cardioplegia was delivered every 20 minutes.  The distal coronary anastomoses were performed.  The first distal anastomosis was to the posterior descending branch of right coronary. The right coronary was totally occluded.  A reverse saphenous vein was sewn end-to-side with running 7-0 Prolene with good flow through graft. The second distal anastomosis was to the obtuse marginal branch of the left coronary to circumflex.  It had a proximal 80% stenosis.  Reverse saphenous vein was sewn end-to-side with running 7-0 Prolene with good flow through the graft.  The third distal anastomosis was to the diagonal branch to LAD.  This had a proximal 90% stenosis with a stent that had been diseased.  Reverse saphenous vein was sewn end-to-side with running 7-0 Prolene with good flow through the graft.  Cardioplegia was redosed.  The fourth distal anastomosis was to the distal-third of the LAD.  Proximally, there was a 90% in-stent stenosis.  The left IMA pedicle was brought through an opening in  the pericardium and was brought down onto the LAD and sewn end-to-side with running 8-0 Prolene. There was excellent flow through the anastomosis after briefly releasing the pedicle and bulldog on the mammary artery.  The bulldog was reapplied and the pedicle was secured to the epicardium.  Cardioplegia was redosed.  While crossclamp was placed, three proximal vein anastomoses were placed on the ascending aorta using a 4.0-mm punch running 7-0 Prolene.  Prior to removing the crossclamp, air was vented from the coronaries with a dose of retrograde warm blood cardioplegia.  The crossclamp was removed.  The heart resumed a spontaneous rhythm.  The grafts were de-aired and opened and each had good flow.  Hemostasis was documented in the proximal and distal anastomoses.  Cardioplegia lines were removed. Temporary pacing wires were applied.  The patient was rewarmed and reperfused.  The lungs were re-expanded.  The ventilator was resumed. The patient was started on low-dose dopamine and the ventilator was resumed.  The patient was weaned off bypass without difficulty.  TEE showed improved global LV function.  Protamine was administered without adverse reaction.  However, the patient's platelet count was 70,000 and he had diffuse coagulopathy, he received a  platelet transfusion with improved coagulation function.  The superior pericardial fat was closed over the aorta.  The anterior mediastinal and left pleural chest tube were placed and brought out through separate incisions. The sternum was closed with wire.  The patient remained hemodynamically stable with cardiac output of 5 L.  The pectoralis fascia was closed with a running #1 Vicryl.  Subcutaneous and skin layers were closed with running Vicryl, and sterile dressings were applied.  Total cardiopulmonary bypass time was 134 minutes.     Kerin Perna, M.D.     PV/MEDQ  D:  10/24/2011  T:  10/24/2011  Job:  [id number]  cc:    Marcina Millard, M.D.

## 2011-10-27 NOTE — Anesthesia Postprocedure Evaluation (Signed)
  Anesthesia Post-op Note  Patient: Nathaniel Woods  Procedure(s) Performed: Procedure(s) (LRB): CORONARY ARTERY BYPASS GRAFTING (CABG) (N/A)  Patient Location: ICU  Anesthesia Type: General  Level of Consciousness: unresponsive  Airway and Oxygen Therapy: Patient remains intubated per anesthesia plan  Post-op Pain: none  Post-op Assessment: Post-op Vital signs reviewed, Patient's Cardiovascular Status Stable and Respiratory Function Stable  Post-op Vital Signs: Reviewed and stable  Complications: No apparent anesthesia complications

## 2011-10-27 NOTE — Progress Notes (Signed)
Patient ID: Nathaniel Woods, male   DOB: 04-17-1929, 78 y.o.   MRN: [id number]                   [address]            [place] [address]          [phone]     3 Days Post-Op Procedure(s) (LRB): CORONARY ARTERY BYPASS GRAFTING (CABG) (N/A)  Total Length of Stay:  LOS: 5 days  BP 121/61  Pulse 91  Temp(Src) 97.6 F (36.4 C) (Oral)  Resp 23  Ht 6\' 3"  (1.905 m)  Wt 236 lb 1.8 oz (107.1 kg)  BMI 29.51 kg/m2  SpO2 98%     . sodium chloride 20 mL/hr at 10/26/11 0800  . DISCONTD: sodium chloride Stopped (10/25/11 1600)  . DISCONTD: sodium chloride    . DISCONTD: DOPamine 1 mcg/kg/min (10/27/11 0500)  . DISCONTD: insulin (NOVOLIN-R) infusion Stopped (10/26/11 1300)  . DISCONTD: lactated ringers Stopped (10/26/11 0800)  . DISCONTD: nitroGLYCERIN Stopped (10/24/11 1600)  . DISCONTD: phenylephrine (NEO-SYNEPHRINE) Adult infusion 10 mcg/min (10/26/11 1800)     Lab Results  Component Value Date   WBC 11.3* 10/27/2011   HGB 8.5* 10/27/2011   HCT 24.0* 10/27/2011   PLT 108* 10/27/2011   GLUCOSE 86 10/27/2011   ALT 13 10/19/2011   AST 21 10/19/2011   NA 128* 10/27/2011   K 4.0 10/27/2011   CL 99 10/27/2011   CREATININE 0.78 10/27/2011   BUN 18 10/27/2011   CO2 22 10/27/2011   INR 1.40 10/24/2011   HGBA1C 6.1* 10/22/2011   Stable post op Av paced Going to circle  Delight Ovens MD  Beeper [phone] Office [phone] 10/27/2011 4:33 PM

## 2011-10-28 ENCOUNTER — Inpatient Hospital Stay (HOSPITAL_COMMUNITY): Payer: Medicare HMO

## 2011-10-28 LAB — BASIC METABOLIC PANEL
BUN: 19 mg/dL (ref 6–23)
CO2: 20 mEq/L (ref 19–32)
Calcium: 8 mg/dL — ABNORMAL LOW (ref 8.4–10.5)
Chloride: 98 mEq/L (ref 96–112)
Creatinine, Ser: 1.08 mg/dL (ref 0.50–1.35)
GFR calc Af Amer: 72 mL/min — ABNORMAL LOW (ref >90)
GFR calc non Af Amer: 62 mL/min — ABNORMAL LOW (ref >90)
Glucose, Bld: 69 mg/dL — ABNORMAL LOW (ref 70–99)
Potassium: 4.1 mEq/L (ref 3.5–5.1)
Sodium: 129 mEq/L — ABNORMAL LOW (ref 135–145)

## 2011-10-28 LAB — GLUCOSE, CAPILLARY
Glucose-Capillary: 89 mg/dL (ref 70–99)
Glucose-Capillary: 84 mg/dL (ref 70–99)
Glucose-Capillary: 61 mg/dL — ABNORMAL LOW (ref 70–99)
Glucose-Capillary: 96 mg/dL (ref 70–99)
Glucose-Capillary: 84 mg/dL (ref 70–99)

## 2011-10-28 LAB — URINALYSIS, ROUTINE W REFLEX MICROSCOPIC
Leukocytes, UA: NEGATIVE
Nitrite: NEGATIVE
Specific Gravity, Urine: 1.01 (ref 1.005–1.030)
Urobilinogen, UA: 0.2 mg/dL (ref 0.0–1.0)
pH: 5.5 (ref 5.0–8.0)

## 2011-10-28 LAB — CBC
HCT: 25.5 % — ABNORMAL LOW (ref 39.0–52.0)
Hemoglobin: 8.9 g/dL — ABNORMAL LOW (ref 13.0–17.0)
MCH: 33.7 pg (ref 26.0–34.0)
MCHC: 34.9 g/dL (ref 30.0–36.0)
MCV: 96.6 fL (ref 78.0–100.0)
Platelets: 158 10*3/uL (ref 150–400)
RBC: 2.64 MIL/uL — ABNORMAL LOW (ref 4.22–5.81)
RDW: 16 % — ABNORMAL HIGH (ref 11.5–15.5)
WBC: 12.8 10*3/uL — ABNORMAL HIGH (ref 4.0–10.5)

## 2011-10-28 MED ORDER — DIPHENHYDRAMINE HCL 25 MG PO CAPS: 25.0000 mg | ORAL_CAPSULE | Freq: Every evening | ORAL | Status: DC | PRN

## 2011-10-28 MED ORDER — POLYSACCHARIDE IRON COMPLEX 150 MG PO CAPS: 150.0000 mg | ORAL_CAPSULE | Freq: Every day | ORAL | Status: DC

## 2011-10-28 MED ORDER — FUROSEMIDE 40 MG PO TABS
40.0000 mg | ORAL_TABLET | Freq: Every day | ORAL | Status: DC
  Administered 2011-10-29 – 2011-11-03 (×6): 40 mg via ORAL
  Filled 2011-10-28 (×6): qty 1

## 2011-10-28 MED ORDER — ASPIRIN 81 MG PO TBEC: 81.0000 mg | DELAYED_RELEASE_TABLET | Freq: Every day | ORAL | Status: AC

## 2011-10-28 MED ORDER — TRAMADOL HCL 50 MG PO TABS: 50.0000 mg | ORAL_TABLET | Freq: Four times a day (QID) | ORAL | Status: AC | PRN

## 2011-10-28 MED ORDER — METFORMIN HCL 500 MG PO TABS
500.0000 mg | ORAL_TABLET | Freq: Every day | ORAL | Status: DC
  Administered 2011-10-28 – 2011-11-03 (×6): 500 mg via ORAL
  Filled 2011-10-28 (×8): qty 1

## 2011-10-28 MED ORDER — METOPROLOL TARTRATE 12.5 MG HALF TABLET: 12.5000 mg | ORAL_TABLET | Freq: Two times a day (BID) | ORAL | Status: DC

## 2011-10-28 MED ORDER — LEVALBUTEROL HCL 0.63 MG/3ML IN NEBU
0.6300 mg | INHALATION_SOLUTION | Freq: Three times a day (TID) | RESPIRATORY_TRACT | Status: DC | PRN
  Administered 2011-11-02: 0.63 mg via RESPIRATORY_TRACT
  Filled 2011-10-28 (×3): qty 3

## 2011-10-28 MED ORDER — POTASSIUM CHLORIDE CRYS ER 20 MEQ PO TBCR: 20.0000 meq | EXTENDED_RELEASE_TABLET | Freq: Every day | ORAL | Status: DC

## 2011-10-28 MED ORDER — AMIODARONE HCL 200 MG PO TABS
200.0000 mg | ORAL_TABLET | Freq: Every day | ORAL | Status: DC
  Administered 2011-10-28 – 2011-11-03 (×7): 200 mg via ORAL
  Filled 2011-10-28 (×7): qty 1

## 2011-10-28 MED ORDER — CLONAZEPAM 0.5 MG PO TABS
0.5000 mg | ORAL_TABLET | Freq: Every day | ORAL | Status: DC
  Administered 2011-10-28 – 2011-11-02 (×6): 0.5 mg via ORAL
  Filled 2011-10-28 (×6): qty 1

## 2011-10-28 MED ORDER — FUROSEMIDE 10 MG/ML IJ SOLN
40.0000 mg | Freq: Once | INTRAMUSCULAR | Status: AC
  Administered 2011-10-28: 40 mg via INTRAVENOUS
  Filled 2011-10-28: qty 4

## 2011-10-28 MED ORDER — FUROSEMIDE 40 MG PO TABS: 40.0000 mg | ORAL_TABLET | Freq: Every day | ORAL | Status: DC

## 2011-10-28 MED FILL — Mannitol IV Soln 20%: INTRAVENOUS | Qty: 500 | Status: AC

## 2011-10-28 MED FILL — Heparin Sodium (Porcine) Inj 1000 Unit/ML: INTRAMUSCULAR | Qty: 30 | Status: AC

## 2011-10-28 MED FILL — Sodium Chloride Irrigation Soln 0.9%: Qty: 3000 | Status: AC

## 2011-10-28 MED FILL — Heparin Sodium (Porcine) Inj 1000 Unit/ML: INTRAMUSCULAR | Qty: 10 | Status: AC

## 2011-10-28 MED FILL — Electrolyte-R (PH 7.4) Solution: INTRAVENOUS | Qty: 7000 | Status: AC

## 2011-10-28 MED FILL — Sodium Bicarbonate IV Soln 8.4%: INTRAVENOUS | Qty: 50 | Status: AC

## 2011-10-28 MED FILL — Sodium Chloride IV Soln 0.9%: INTRAVENOUS | Qty: 1000 | Status: AC

## 2011-10-28 MED FILL — Lidocaine HCl IV Inj 20 MG/ML: INTRAVENOUS | Qty: 10 | Status: AC

## 2011-10-28 NOTE — Discharge Instructions (Signed)
Coronary Artery Bypass Grafting Care After Refer to this sheet in the next few weeks. These instructions provide you with information on caring for yourself after your procedure. Your caregiver may also give you more specific instructions. Your treatment has been planned according to current medical practices, but problems sometimes occur. Call your caregiver if you have any problems or questions after your procedure.  Recovery from open heart surgery will be different for everyone. Some people feel well after 3 or 4 weeks, while for others it takes longer. After heart surgery, it may be normal to:  Not have an appetite, feel nauseated by the smell of food, or only want to eat a small amount.   Be constipated because of changes in your diet, activity, and medicines. Eat foods high in fiber. Add fresh fruits and vegetables to your diet. Stool softeners may be helpful.   Feel sad or unhappy. You may be frustrated or cranky. You may have good days and bad days. Do not give up. Talk to your caregiver if you do not feel better.   Feel weakness and fatigue. You many need physical therapy or cardiac rehabilitation to get your strength back.   Develop an irregular heartbeat called atrial fibrillation. Symptoms of atrial fibrillation are a fast, irregular heartbeat or feelings of fluttery heartbeats, shortness of breath, low blood pressure, and dizziness. If these symptoms develop, see your caregiver right away.  MEDICATION  Have a list of all the medicines you will be taking when you leave the hospital. For every medicine, know the following:   Name.   Exact dose.   Time of day to be taken.   How often it should be taken.   Why you are taking it.   Ask which medicines should or should not be taken together. If you take more than one heart medicine, ask if it is okay to take them together. Some heart medicines should not be taken at the same time because they may lower your blood pressure too  much.   Narcotic pain medicine can cause constipation. Eat fresh fruits and vegetables. Add fiber to your diet. Stool softener medicine may help relieve constipation.   Keep a copy of your medicines with you at all times.   Do not add or stop taking any medicine until you check with your caregiver.   Medicines can have side effects. Call your caregiver who prescribed the medicine if you:   Start throwing up, have diarrhea, or have stomach pain.   Feel dizzy or lightheaded when you stand up.   Feel your heart is skipping beats or is beating too fast or too slow.   Develop a rash.   Notice unusual bruising or bleeding.  HOME CARE INSTRUCTIONS  After heart surgery, it is important to learn how to take your pulse. Have your caregiver show you how to take your pulse.   Use your incentive spirometer. Ask your caregiver how long after surgery you need to use it.  Care of your chest incision  Tell your caregiver right away if you notice clicking in your chest (sternum).   Support your chest with a pillow or your arms when you take deep breaths and cough.   Follow your caregiver's instructions about when you can bathe or swim.   Protect your incision from sunlight during the first year to keep the scar from getting dark.   Tell your caregiver if you notice:   Increased tenderness of your incision.   Increased redness   or swelling around your incision.   Drainage or pus from your incision.  Care of your leg incision(s)  Avoid crossing your legs.   Avoid sitting for long periods of time. Change positions every half hour.   Elevate your leg(s) when you are sitting.   Check your leg(s) daily for swelling. Check the incisions for redness or drainage.   Wear your elastic stockings as told by your caregiver. Take them off at bedtime.  Diet  Diet is very important to heart health.   Eat plenty of fresh fruits and vegetables. Meats should be lean cut. Avoid canned, processed, and  fried foods.   Talk to a dietician. They can teach you how to make healthy food and drink choices.  Weight  Weigh yourself every day. This is important because it helps to know if you are retaining fluid that may make your heart and lungs work harder.   Use the same scale each time.   Weigh yourself every morning at the same time. You should do this after you go to the bathroom, but before you eat breakfast.   Your weight will be more accurate if you do not wear any clothes.   Record your weight.   Tell your caregiver if you have gained 2 pounds or more overnight.  Activity Stop any activity at once if you have chest pain, shortness of breath, irregular heartbeats, or dizziness. Get help right away if you have any of these symptoms.  Bathing.  Avoid soaking in a bath or hot tub until your incisions are healed.   Rest. You need a balance of rest and activity.   Exercise. Exercise per your caregiver's advice. You may need physical therapy or cardiac rehabilitation to help strengthen your muscles and build your endurance.   Climbing stairs. Unless your caregiver tells you not to climb stairs, go up stairs slowly and rest if you tire. Do not pull yourself up by the handrail.   Driving a car. Follow your caregiver's advice on when you may drive. You may ride as a passenger at any time. When traveling for long periods of time in a car, get out of the car and walk around for a few minutes every 2 hours.   Lifting. Avoid lifting, pushing, or pulling anything heavier than 10 pounds for 6 weeks after surgery or as told by your caregiver.   Returning to work. Check with your caregiver. People heal at different rates. Most people will be able to go back to work 6 to 12 weeks after surgery.   Sexual activity. You may resume sexual relations as told by your caregiver.  SEEK MEDICAL CARE IF:  Any of your incisions are red, painful, or have any type of drainage coming from them.   You have an  oral temperature above 102 F (38.9 C).   You have ankle or leg swelling.   You have pain in your legs.   You have weight gain of 2 or more pounds a day.   You feel dizzy or lightheaded when you stand up.  SEEK IMMEDIATE MEDICAL CARE IF:  You have angina or chest pain that goes to your jaw or arms. Call your local emergency services right away.   You have shortness of breath at rest or with activity.   You have a fast or irregular heartbeat (arrhythmia).   There is a "clicking" in your sternum when you move.   You have numbness or weakness in your arms or legs.    MAKE SURE YOU:  Understand these instructions.   Will watch your condition.   Will get help right away if you are not doing well or get worse.  Document Released: 12/13/2004 Document Revised: 05/15/2011 Document Reviewed: 07/31/2010 [employer] Patient Information 2012 [employer]  Endoscopic Saphenous Vein Harvesting Care After Refer to this sheet in the next few weeks. These instructions provide you with information on caring for yourself after your procedure. Your caregiver may also give you more specific instructions. Your treatment has been planned according to current medical practices, but problems sometimes occur. Call your caregiver if you have any problems or questions after your procedure. HOME CARE INSTRUCTIONS Medicine  Take whatever pain medicine your surgeon prescribes. Follow the directions carefully. Do not take over-the-counter pain medicine unless your surgeon says it is okay. Some pain medicine can cause bleeding problems for several weeks after surgery.   Follow your surgeon's instructions about driving. You will probably not be permitted to drive after heart surgery.   Take any medicines your surgeon prescribes. Any medicines you took before your heart surgery should be checked with your caregiver before you start taking them again.  Wound care  Ask your surgeon how long you should keep  wearing your elastic bandage or stocking.   Check the area around your surgical cuts (incisions) whenever your bandages (dressings) are changed. Look for any redness or swelling.   You will need to return to have the stitches (sutures) or staples taken out. Ask your surgeon when to do that.   Ask your surgeon when you can shower or bathe.  Activity  Try to keep your legs raised when you are sitting.   Do any exercises your caregivers have given you. These may include deep breathing exercises, coughing, walking, or other exercises.  SEEK MEDICAL CARE IF:  You have any questions about your medicines.   You have more leg pain, especially if your pain medicine stops working.   New or growing bruises develop on your leg.   Your leg swells, feels tight, or becomes red.   You have numbness in your leg.  SEEK IMMEDIATE MEDICAL CARE IF:  Your pain gets much worse.   Blood or fluid leaks from any of the incisions.   Your incisions become warm, swollen, or red.   You have chest pain.   You have trouble breathing.   You have a fever.   You have more pain near your leg incision.  MAKE SURE YOU:  Understand these instructions.   Will watch your condition.   Will get help right away if you are not doing well or get worse.  Document Released: 02/05/2011 Document Revised: 05/15/2011 Document Reviewed: 02/05/2011 [employer] Patient Information 2012 [employer]  .Endoscopic Saphenous Vein Harvesting Care After Refer to this sheet in the next few weeks. These instructions provide you with information on caring for yourself after your procedure. Your caregiver may also give you more specific instructions. Your treatment has been planned according to current medical practices, but problems sometimes occur. Call your caregiver if you have any problems or questions after your procedure. HOME CARE INSTRUCTIONS Medicine  Take whatever pain medicine your surgeon prescribes. Follow the  directions carefully. Do not take over-the-counter pain medicine unless your surgeon says it is okay. Some pain medicine can cause bleeding problems for several weeks after surgery.   Follow your surgeon's instructions about driving. You will probably not be permitted to drive after heart surgery.   Take any medicines your   surgeon prescribes. Any medicines you took before your heart surgery should be checked with your caregiver before you start taking them again.  Wound care  Ask your surgeon how long you should keep wearing your elastic bandage or stocking.   Check the area around your surgical cuts (incisions) whenever your bandages (dressings) are changed. Look for any redness or swelling.   You will need to return to have the stitches (sutures) or staples taken out. Ask your surgeon when to do that.   Ask your surgeon when you can shower or bathe.  Activity  Try to keep your legs raised when you are sitting.   Do any exercises your caregivers have given you. These may include deep breathing exercises, coughing, walking, or other exercises.  SEEK MEDICAL CARE IF:  You have any questions about your medicines.   You have more leg pain, especially if your pain medicine stops working.   New or growing bruises develop on your leg.   Your leg swells, feels tight, or becomes red.   You have numbness in your leg.  SEEK IMMEDIATE MEDICAL CARE IF:  Your pain gets much worse.   Blood or fluid leaks from any of the incisions.   Your incisions become warm, swollen, or red.   You have chest pain.   You have trouble breathing.   You have a fever.   You have more pain near your leg incision.  MAKE SURE YOU:  Understand these instructions.   Will watch your condition.   Will get help right away if you are not doing well or get worse.  Document Released: 02/05/2011 Document Revised: 05/15/2011 Document Reviewed: 02/05/2011 [employer] Patient Information 2012 [employer].   

## 2011-10-28 NOTE — Progress Notes (Addendum)
4 Days Post-Op Procedure(s) (LRB): CORONARY ARTERY BYPASS GRAFTING (CABG) (N/A)  Subjective: Patient not sleeping well, has shortness of breath with exertion, and is concerned that he is attached to external pacer and that hr is set "too high".  Objective: Vital signs in last 24 hours: Patient Vitals for the past 24 hrs:  BP Temp Temp src Pulse Resp SpO2 Weight  10/28/11 0633 131/69 mmHg - Oral 90  20  100 % 233 lb 4 oz (105.8 kg)  10/27/11 2101 112/62 mmHg 97.8 F (36.6 C) Oral 91  18  91 % 236 lb 5.3 oz (107.2 kg)  10/27/11 2000 - - - 90  - 100 % -  10/27/11 1800 119/65 mmHg - - 91  23  99 % -  10/27/11 1700 - - - 91  28  99 % -  10/27/11 1600 121/61 mmHg - - 91  23  98 % -  10/27/11 1500 99/66 mmHg 97.6 F (36.4 C) Oral 91  22  100 % -  10/27/11 1400 111/62 mmHg - - 90  22  100 % -  10/27/11 1300 104/62 mmHg - - 90  22  100 % -  10/27/11 1200 114/54 mmHg - - 91  21  99 % -  10/27/11 1147 - 97.6 F (36.4 C) Oral - - - -  10/27/11 1100 107/58 mmHg - - 92  21  87 % -  10/27/11 1000 135/82 mmHg - - 91  24  100 % -  10/27/11 0900 97/77 mmHg - - 91  21  100 % -  10/27/11 0800 108/60 mmHg - - 98  23  98 % -  10/27/11 0734 - 97.9 F (36.6 C) Oral - - - -   Pre op weight  96 kg Current Weight  10/28/11 233 lb 4 oz (105.8 kg)      Intake/Output from previous day: 05/20 0701 - 05/21 0700 In: 1020 [P.O.:840; I.V.:180] Out: 710 [Urine:710]   Physical Exam:  Cardiovascular: RRR, no murmurs, gallops, or rubs. Pulmonary: Slightly diminished at bases; no rales, wheezes, or rhonchi. Abdomen: Soft, non tender, bowel sounds present. Extremities: Bilateral lower extremity edema with ecchymosis. Wounds: Clean and dry.  No erythema or signs of infection.  Lab Results: CBC: Basename 10/28/11 0527 10/27/11 0400  WBC 12.8* 11.3*  HGB 8.9* 8.5*  HCT 25.5* 24.0*  PLT 158 108*   BMET:  Basename 10/27/11 0400 10/26/11 0453  NA 128* 128*  K 4.0 3.7  CL 99 98  CO2 22 20  GLUCOSE  86 109*  BUN 18 18  CREATININE 0.78 1.35  CALCIUM 7.5* 7.6*    PT/INR: No results found for this basename: LABPROT,INR in the last 72 hours ABG:  INR: Will add last result for INR, ABG once components are confirmed Will add last 4 CBG results once components are confirmed  Assessment/Plan:  1. CV - Afib, AV paced at 90.Continue Lopressor 12.5 bid. 2.  Pulmonary - Encourage incentive spirometer.CXR this am shows improvement in atelectasis at bases, small bilateral pleural effusions, probable small left apical ptx. 3. Volume Overload - Continue with diuresis.Will give Lasix IV this am. 4.  Acute blood loss anemia - H/H increased this am to 8.9/25.5.Continue Niferex. 5.Leukocytosis-WBC slightly increased from 11.3 to 12.8.Remains afebrile.No signs of wound infection.Check UA. 6.Pre op HGA1C 6.1.CBGs 95/117/89.Stop scheduled insulin and restart metformin. 7.Benadryl (has taken previously without problems) PRN sleep.   ZIMMERMAN,DONIELLE MPA-C 10/28/2011   patient examined and medical record reviewed,agree  with above note.  Cardiac rhythm remains an issue and may need to start coumadin for slow afib. Check baseline INR VAN TRIGT III,Jacyln Carmer 10/28/2011

## 2011-10-28 NOTE — Progress Notes (Signed)
CBG: 61  Treatment: 15 GM carbohydrate snack  Symptoms: None  Follow-up CBG: Time:17:15 CBG Result:96  Possible Reasons for Event: Inadequate meal intake  Comments/MD notified:no     Nathaniel Woods

## 2011-10-28 NOTE — Progress Notes (Signed)
CARDIAC REHAB PHASE I   PRE:  Rate/Rhythm: 73SR paced beats  BP:  Supine:   Sitting: 133/63  Standing:    SaO2: 99%2L  MODE:  Ambulation: 292 ft   POST:  Rate/Rhythem: 103  BP:  Supine:   Sitting: 129/52  Standing:    SaO2: 99%3L 1413-1500 Pt walked 292 ft on 3L with rolling walker and asst x 2 with fairly steady gait. Some DOE noted.  Tired by end of walk. To recliner with call bell. Will keep as asst x 2. Sats good on 3L.  Duanne Limerick

## 2011-10-28 NOTE — Evaluation (Signed)
Physical Therapy Evaluation Patient Details Name: Nathaniel Woods MRN: [id number] DOB: March 06, 1929 Today's Date: 10/28/2011 Time: 1191-4782 PT Time Calculation (min): 40 min  PT Assessment / Plan / Recommendation Clinical Impression  Pt s/p CABG who will benefit from further therapy to maximize transfers, mobility, gait and function adhering to precautions to decrease burden of care and increase independence. Pt very concerned over paced HR and feeling that [facility] does not have all his records from [facility] and [place]. Pt reassured regarding care and educated for sternal precautions and importance of followindg. Pt stating he has been ambulating to bathroom on his own but without assist pt strongly pushing with bil UE.     PT Assessment  Patient needs continued PT services    Follow Up Recommendations  Home health PT;Skilled nursing facility;Supervision for mobility/OOB (if pt able to meet supervision level then home otherwise SNF)    Barriers to Discharge Decreased caregiver support Spouse 75 and prior CVA able to provide supervision only    lEquipment Recommendations  Rolling walker with 5" wheels    Recommendations for Other Services OT consult   Frequency Min 3X/week    Precautions / Restrictions Precautions Precautions: Sternal;Fall Precaution Comments: oxygen   Pertinent Vitals/Pain O2 94 on 2L at rest did drop to 85% with ambulation HR 90-100 with ambulation No pain      Mobility  Transfers Transfers: Sit to Stand;Stand to Sit Sit to Stand: 3: Mod assist;From chair/3-in-1 Stand to Sit: 4: Min guard;To chair/3-in-1 Details for Transfer Assistance: cueing for hand placement, safety and assist for anterior translation Ambulation/Gait Ambulation/Gait Assistance: 5: Supervision Ambulation Distance (Feet): 150 Feet Assistive device: Rolling walker Ambulation/Gait Assistance Details: cueing for stepping into RW and increased trunk extension with one standing rest break and cueing  throughout for breathing technique Gait Pattern: Decreased stride length Gait velocity: decreased Stairs: No    Exercises     PT Diagnosis: Abnormality of gait;Difficulty walking  PT Problem List: Decreased strength;Decreased activity tolerance;Decreased mobility;Decreased knowledge of precautions PT Treatment Interventions: Gait training;Stair training;DME instruction;Functional mobility training;Therapeutic activities;Therapeutic exercise;Patient/family education   PT Goals Acute Rehab PT Goals PT Goal Formulation: With patient Time For Goal Achievement: 11/11/11 Potential to Achieve Goals: Good Pt will go Supine/Side to Sit: with supervision;with HOB 0 degrees PT Goal: Supine/Side to Sit - Progress: Goal set today Pt will go Sit to Supine/Side: with supervision PT Goal: Sit to Supine/Side - Progress: Goal set today Pt will go Sit to Stand: with supervision PT Goal: Sit to Stand - Progress: Goal set today Pt will go Stand to Sit: with supervision PT Goal: Stand to Sit - Progress: Goal set today Pt will Ambulate: >150 feet;with supervision;with rolling walker PT Goal: Ambulate - Progress: Goal set today Pt will Go Up / Down Stairs: 1-2 stairs;with min assist;with least restrictive assistive device PT Goal: Up/Down Stairs - Progress: Goal set today Additional Goals Additional Goal #1: Pt will independently verbalize and demonstrate adherence to sternal precautions PT Goal: Additional Goal #1 - Progress: Goal set today  Visit Information  Last PT Received On: 10/28/11 Assistance Needed: +1    Subjective Data  Subjective: My HR should be 70 at rest not 90. They are missing all my background information that is at [facility] and [facility] Patient Stated Goal: be able to get out of here and go home   Prior Functioning  Home Living Lives With: Spouse Available Help at Discharge: Family Type of Home: House Home Access: Stairs to enter [employer] of  Steps: 1 Home Layout: One  level Bathroom Toilet: Standard Home Adaptive Equipment: Straight cane;Built-in shower seat;Grab bars in shower Prior Function Level of Independence: Independent Able to Take Stairs?: Yes Driving: Yes Vocation: Retired Musician: No difficulties    Cognition  Overall Cognitive Status: Appears within functional limits for tasks assessed/performed Arousal/Alertness: Awake/alert Orientation Level: Appears intact for tasks assessed Behavior During Session: [facility] for tasks performed Cognition - Other Comments: Pt perseverating on HR paced    Extremity/Trunk Assessment Right Lower Extremity Assessment RLE ROM/Strength/Tone: [facility] for tasks assessed Left Lower Extremity Assessment LLE ROM/Strength/Tone: WFL for tasks assessed   Balance    End of Session PT - End of Session Equipment Utilized During Treatment: Gait belt Activity Tolerance: Patient tolerated treatment well Patient left: in chair;with call bell/phone within reach Nurse Communication: Mobility status   Delorse Lek 10/28/2011, 10:05 AM  Delaney Meigs, PT [phone]

## 2011-10-29 LAB — GLUCOSE, CAPILLARY
Glucose-Capillary: 55 mg/dL — ABNORMAL LOW (ref 70–99)
Glucose-Capillary: 83 mg/dL (ref 70–99)
Glucose-Capillary: 100 mg/dL — ABNORMAL HIGH (ref 70–99)
Glucose-Capillary: 106 mg/dL — ABNORMAL HIGH (ref 70–99)
Glucose-Capillary: 111 mg/dL — ABNORMAL HIGH (ref 70–99)

## 2011-10-29 LAB — PROTIME-INR
INR: 1.15 (ref 0.00–1.49)
Prothrombin Time: 14.9 seconds (ref 11.6–15.2)

## 2011-10-29 MED ORDER — AMIODARONE HCL 200 MG PO TABS: 200.0000 mg | ORAL_TABLET | Freq: Every day | ORAL | Status: DC

## 2011-10-29 MED ORDER — POTASSIUM CHLORIDE CRYS ER 20 MEQ PO TBCR
20.0000 meq | EXTENDED_RELEASE_TABLET | Freq: Once | ORAL | Status: AC
  Administered 2011-10-29: 20 meq via ORAL
  Filled 2011-10-29: qty 1

## 2011-10-29 MED ORDER — FUROSEMIDE 40 MG PO TABS
40.0000 mg | ORAL_TABLET | Freq: Once | ORAL | Status: AC
  Administered 2011-10-29: 40 mg via ORAL
  Filled 2011-10-29: qty 1

## 2011-10-29 NOTE — Progress Notes (Signed)
External pacermaker off, disconnected and rolled and taped epw.

## 2011-10-29 NOTE — Progress Notes (Addendum)
Pt. Showing up A-fib on monitor in the 70's, EKG done, showed SR with premature atrial complexes, VS stable, HR 75, will continue to monitor.

## 2011-10-29 NOTE — Progress Notes (Signed)
Spoke with wife this afternoon- discussed SNF -vs- home with HH. She is open to both- prefers home if appropriate so we will await PT/OT input.  Will proceed with SNF in case this is necessary- full assessment and FL2 to follow- Reece Levy, MSW, Theresia Majors [phone]

## 2011-10-29 NOTE — Progress Notes (Signed)
Pt. Refused to ambulate tonight, encouraged to walk in the am.

## 2011-10-29 NOTE — Progress Notes (Signed)
CBG: 55 at 0632  Treatment: orange juice  Symptoms: lethargic  Follow-up CBG: Time: 0658 CBG Result: 83  Possible Reasons for Event: inadequate intake hs?  Comments/MD notified:    Arva Chafe

## 2011-10-29 NOTE — Progress Notes (Signed)
CARDIAC REHAB PHASE I   PRE:  Rate/Rhythm: 69SR  BP:  Supine:   Sitting: 140/75  Standing:    SaO2: 99%2L,96%RA  MODE:  Ambulation: 440 ft   POST:  Rate/Rhythem:101 ST  BP:  Supine:   Sitting: 167/65  Standing:    SaO2: 93%RA 1300-1325 Pt stronger today. Walked 440 ft on RA with rolling walker and asst x 2. Took 2 standing rest breaks. Sats good on RA but some DOE noted and some wheezing. Left off oxygen and notified RN.  Can be asst x 1. To recliner with call bell.  Duanne Limerick

## 2011-10-29 NOTE — Progress Notes (Addendum)
5 Days Post-Op Procedure(s) (LRB): CORONARY ARTERY BYPASS GRAFTING (CABG) (N/A)  Subjective: Patient without complaints this am.  Objective: Vital signs in last 24 hours: Patient Vitals for the past 24 hrs:  BP Temp Temp src Pulse Resp SpO2 Weight  10/29/11 0632 118/69 mmHg 98.2 F (36.8 C) Oral 72  19  94 % 240 lb 8.4 oz (109.1 kg)  10/28/11 2015 146/63 mmHg - Oral 64  20  100 % -  10/28/11 0925 - - - 91  - 94 % -   Pre op weight  96 kg Current Weight  10/29/11 240 lb 8.4 oz (109.1 kg)      Intake/Output from previous day: 05/21 0701 - 05/22 0700 In: 240 [P.O.:240] Out: 400 [Urine:400]   Physical Exam:  Cardiovascular: RRR, no murmurs, gallops, or rubs. Pulmonary: Slightly diminished at bases; no rales, wheezes, or rhonchi. Abdomen: Soft, non tender, bowel sounds present. Extremities: Bilateral lower extremity edema with ecchymosis. Wounds: Clean and dry.  No erythema or signs of infection.  Lab Results: CBC:  Basename 10/28/11 0527 10/27/11 0400  WBC 12.8* 11.3*  HGB 8.9* 8.5*  HCT 25.5* 24.0*  PLT 158 108*   BMET:   Basename 10/28/11 0527 10/27/11 0400  NA 129* 128*  K 4.1 4.0  CL 98 99  CO2 20 22  GLUCOSE 69* 86  BUN 19 18  CREATININE 1.08 0.78  CALCIUM 8.0* 7.5*    PT/INR: No results found for this basename: LABPROT,INR in the last 72 hours ABG:  INR: Will add last result for INR, ABG once components are confirmed Will add last 4 CBG results once components are confirmed  Assessment/Plan:  1. CV - Previous fib.Maintaining SR.Continue Lopressor 12.5 bid and Amiodarone 200 daily. 2.  Pulmonary - Encourage incentive spirometer.Wean O2 as tolerates. 3. Volume Overload - Continue with diuresis.Will give additional Lasix  This today. 4.  Acute blood loss anemia - H/H increased this am to 8.9/25.5.Continue Niferex. 5.Leukocytosis-WBC slightly increased from 11.3 to 12.8.Remains afebrile.No signs of wound infection.UA is negative. 6.Pre op HGA1C  6.1.CBGs 84/55/83 .Stop scheduled insulin. Will hold Metformin today. 7.Klonopin helped with sleep last evening.   ZIMMERMAN,DONIELLE MPA-C 10/29/2011   patient examined and medical record reviewed,agree with above note.  Cardiac rhythm remains an issue and may need to start coumadin for slow afib. Check baseline INR ZIMMERMAN,DONIELLE M 10/29/2011   patient examined and medical record reviewed,agree with above note.Poor candidate for coumadin due to hx of subdural  Hematoma- cont aspirin only VAN TRIGT III,Antwion Carpenter 10/29/2011

## 2011-10-30 ENCOUNTER — Encounter (HOSPITAL_COMMUNITY): Payer: Self-pay

## 2011-10-30 LAB — GLUCOSE, CAPILLARY
Glucose-Capillary: 147 mg/dL — ABNORMAL HIGH (ref 70–99)
Glucose-Capillary: 175 mg/dL — ABNORMAL HIGH (ref 70–99)
Glucose-Capillary: 166 mg/dL — ABNORMAL HIGH (ref 70–99)

## 2011-10-30 MED ORDER — POTASSIUM CHLORIDE CRYS ER 20 MEQ PO TBCR
20.0000 meq | EXTENDED_RELEASE_TABLET | Freq: Once | ORAL | Status: AC
  Administered 2011-10-30: 20 meq via ORAL
  Filled 2011-10-30: qty 1

## 2011-10-30 MED ORDER — DM-GUAIFENESIN ER 30-600 MG PO TB12
1.0000 | ORAL_TABLET | Freq: Two times a day (BID) | ORAL | Status: DC
  Administered 2011-10-30 – 2011-11-03 (×9): 1 via ORAL
  Filled 2011-10-30 (×11): qty 1

## 2011-10-30 MED ORDER — POLYETHYLENE GLYCOL 3350 17 G PO PACK
17.0000 g | PACK | Freq: Every day | ORAL | Status: DC
  Filled 2011-10-30 (×3): qty 1

## 2011-10-30 MED ORDER — FLUTICASONE-SALMETEROL 250-50 MCG/DOSE IN AEPB
1.0000 | INHALATION_SPRAY | Freq: Two times a day (BID) | RESPIRATORY_TRACT | Status: DC
  Administered 2011-10-30 – 2011-11-03 (×7): 1 via RESPIRATORY_TRACT
  Filled 2011-10-30: qty 14

## 2011-10-30 MED ORDER — FUROSEMIDE 40 MG PO TABS
40.0000 mg | ORAL_TABLET | Freq: Once | ORAL | Status: AC
  Administered 2011-10-30: 40 mg via ORAL
  Filled 2011-10-30: qty 1

## 2011-10-30 MED ORDER — ENOXAPARIN SODIUM 30 MG/0.3ML ~~LOC~~ SOLN
30.0000 mg | SUBCUTANEOUS | Status: DC
  Administered 2011-10-30 – 2011-11-02 (×4): 30 mg via SUBCUTANEOUS
  Filled 2011-10-30 (×5): qty 0.3

## 2011-10-30 NOTE — Progress Notes (Signed)
6 Days Post-Op Procedure(s) (LRB): CORONARY ARTERY BYPASS GRAFTING (CABG) (N/A)  Subjective: Patient with complaints of constipation.  Objective: Vital signs in last 24 hours: Patient Vitals for the past 24 hrs:  BP Temp Temp src Pulse Resp SpO2 Weight  10/30/11 0601 145/72 mmHg 98.9 F (37.2 C) Oral 79  19  93 % 224 lb 6.9 oz (101.8 kg)  10/29/11 2130 143/79 mmHg 100.1 F (37.8 C) - 73  18  94 % -  10/29/11 1358 129/68 mmHg 98.3 F (36.8 C) Oral 71  18  94 % -  10/29/11 1011 131/59 mmHg - - 77  18  - -   Pre op weight  96 kg Current Weight  10/30/11 224 lb 6.9 oz (101.8 kg)      Intake/Output from previous day: 05/22 0701 - 05/23 0700 In: 460 [P.O.:460] Out: 2650 [Urine:2650]   Physical Exam:  Cardiovascular: RRR, no murmurs, gallops, or rubs. Pulmonary: Slightly diminished at bases; no rales, wheezes, or rhonchi. Abdomen: Soft, non tender, bowel sounds present. Extremities: Bilateral lower extremity edema with ecchymosis of the thighs. Wounds: Clean and dry.  No erythema or signs of infection.  Lab Results: CBC:  Basename 10/28/11 0527  WBC 12.8*  HGB 8.9*  HCT 25.5*  PLT 158   BMET:   Basename 10/28/11 0527  NA 129*  K 4.1  CL 98  CO2 20  GLUCOSE 69*  BUN 19  CREATININE 1.08  CALCIUM 8.0*    PT/INR:   Basename 10/29/11 0700  LABPROT 14.9  INR 1.15   ABG:  INR: Will add last result for INR, ABG once components are confirmed Will add last 4 CBG results once components are confirmed  Assessment/Plan:  1. CV - Previous brief afib.2 EKGs done show SR with PACs and not afib.Continue Lopressor 12.5 bid and Amiodarone 200 daily.Had a previous subdural so is probably not a good coumadin candidate. 2.  Pulmonary - Encourage incentive spirometer. 3. Volume Overload - Continue with diuresis.Will give additional Lasix again today as had good diuresis with this yesterday. 4.  Acute blood loss anemia - H/H increased this am to 8.9/25.5.Continue  Niferex. 5.Leukocytosis-WBC slightly increased from 11.3 to 12.8.Remains afebrile.No signs of wound infection.UA is negative. 6.Pre op HGA1C 6.1.CBGs 106/111/147.Restart Metformin in am. 7.Klonopin has helped with sleep. 8.Miralax for constipation.   Deyonna Fitzsimmons MPA-C 10/30/2011

## 2011-10-30 NOTE — Progress Notes (Addendum)
1610-9604 Cardiac Rehab On arrival pt in chair, asking to get back in bed. Attempted to get him to take a walk before getting in bed.Pt states that he has taken two walks today, when what he did was that he took two separate trips in one walk. He began angry when I tried to tell him that he had only one walk.Assisted him to bed. Discussed Outpt. CRP with pt and family. Pt agrees to program in [place]. Encouraged pt and family to watch [facility] video on TV.

## 2011-10-30 NOTE — Progress Notes (Signed)
Pt reading A-fib on monitor, EKG done, showed Sinus rhythm with premature atrial complexes. HR 79. Will continue to monitor.

## 2011-10-30 NOTE — Progress Notes (Signed)
Physical Therapy Treatment Patient Details Name: Nathaniel Woods MRN: [id number] DOB: Jul 25, 1928 Today's Date: 10/30/2011 Time: 4098-1191 PT Time Calculation (min): 39 min  PT Assessment / Plan / Recommendation Comments on Treatment Session  Pt s/p CABG who is progressing well with mobility but HR up to 133 with hall ambulation today O2 sats 91-93 on RA with activity and wheezing noted. Pt HR 92 prior to session and 105 end of session, RN aware of tachycardia and pt reported no change in symptoms with gait. Pt educated for sternal precautions with all function and safety. Pt agreeable to SNF now. Pt requires redirection to attend to task he gets caught up in providing entire medical history at times.    Follow Up Recommendations  Skilled nursing facility    Barriers to Discharge        Equipment Recommendations       Recommendations for Other Services    Frequency     Plan Discharge plan needs to be updated    Precautions / Restrictions Precautions Precautions: Sternal;Fall Precaution Comments: 0   Pertinent Vitals/Pain 3/10 surgical pain    Mobility  Bed Mobility Bed Mobility: Rolling Right;Right Sidelying to Sit;Sitting - Scoot to Edge of Bed Rolling Right: 4: Min guard Right Sidelying to Sit: 4: Min assist Sitting - Scoot to Edge of Bed: 5: Supervision Details for Bed Mobility Assistance: cueing for precautions and sequence Transfers Transfers: Sit to Stand;Stand to Sit Sit to Stand: 4: Min assist;From bed Stand to Sit: 4: Min guard Details for Transfer Assistance: cueing for hand placement, sequence, safety and anterior translation Ambulation/Gait Ambulation/Gait Assistance: 5: Supervision Ambulation Distance (Feet): 350 Feet Assistive device: Rolling walker Ambulation/Gait Assistance Details: cueing to look up and step into RW Gait Pattern: Step-through pattern;Decreased stride length Gait velocity: 77ft/17sec= 1.17 placing pt at increased fall risk Stairs: No      Exercises General Exercises - Lower Extremity Long Arc Quad: AROM;Both;15 reps;Seated Hip Flexion/Marching: AROM;Both;15 reps;Seated   PT Diagnosis:    PT Problem List:   PT Treatment Interventions:     PT Goals Acute Rehab PT Goals PT Goal: Supine/Side to Sit - Progress: Progressing toward goal PT Goal: Sit to Stand - Progress: Progressing toward goal PT Goal: Stand to Sit - Progress: Progressing toward goal PT Goal: Ambulate - Progress: Progressing toward goal PT Goal: Up/Down Stairs - Progress: Progressing toward goal Additional Goals PT Goal: Additional Goal #1 - Progress: Progressing toward goal  Visit Information  Last PT Received On: 10/30/11 Assistance Needed: +1    Subjective Data  Subjective: If I could feel like I could breathe I wouldn't be so awnry   Cognition  Overall Cognitive Status: Appears within functional limits for tasks assessed/performed Arousal/Alertness: Awake/alert Orientation Level: Appears intact for tasks assessed Behavior During Session: [facility] for tasks performed    Balance     End of Session PT - End of Session Equipment Utilized During Treatment: Gait belt Activity Tolerance: Patient tolerated treatment well Patient left: in chair;with call bell/phone within reach Nurse Communication: Mobility status    Delorse Lek 10/30/2011, 9:04 AM Delaney Meigs, PT [phone]

## 2011-10-30 NOTE — Evaluation (Signed)
Clinical/Bedside Swallow Evaluation Patient Details  Name: Nathaniel Woods MRN: [id number] Date of Birth: [birth year]  Today's Date: 10/30/2011 Time: 1200-1230 SLP Time Calculation (min): 30 min  Past Medical History:  Past Medical History  Diagnosis Date  . Hemorrhagic stroke 09/2008    S/P fall  . Hypertension   . Hyperlipidemia   . Kidney stone   . Stroke   . Coronary artery disease   . Angina   . Inferior MI 10/2011  . Type II diabetes mellitus   . BPH associated with nocturia   . Arthritis   . Anxiety     "just before major surgery"   Past Surgical History:  Past Surgical History  Procedure Date  . Tonsillectomy 1944  . Appendectomy 1947  . Intertrochanteric hip fracture surgery 05/1991    "pin, 2 screws, plate"; right  . Lithotripsy ~ 04/2011  . Coronary angioplasty with stent placement 2009    "2; made total of 6"  . Cardiac catheterization 10/2011  . Vasectomy ~ 1970  . Coronary artery bypass graft 10/24/2011    Procedure: CORONARY ARTERY BYPASS GRAFTING (CABG);  Surgeon: Kerin Perna, MD;  [facility];  Service: Open Heart Surgery;  Laterality: N/A;  Coronary Artery Bypass Graft times four using the left internal mammary artery and the right and left greater saphenous veins harvested endoscopically.  transesophageal Echocardiogram   HPI:  Nathaniel Woods is a 78 y.o. male admitted in transfer from [facility] with severe three-vessel coronary artery disease after recent NSTEMI. The patient has a long history of coronary artery disease dating back to 6. Pt underwent a CABG on 5/17 and has had coughing and SOB throughout his recovery. Pt does not report any dysphagia but does admit to some occasional heartburn.    Assessment / Plan / Recommendation Clinical Impression  Pt did not demonstrate any overt s/s of aspiration during observation. Pt is safe to continue a regular diet and thin liquids. No SLP f/u needed at this time.     Aspiration  Risk  None    Diet Recommendation Regular;Thin liquid   Other  Recommendations     Follow Up Recommendations  None    Frequency and Duration        Pertinent Vitals/Pain NA    SLP Swallow Goals     Swallow Study Prior Functional Status       General HPI: Nathaniel Woods is a 78 y.o. male admitted in transfer from [facility] with severe three-vessel coronary artery disease after recent NSTEMI. The patient has a long history of coronary artery disease dating back to 51. Pt underwent a CABG on 5/17 and has had coughing and SOB throughout his recovery. Pt does not report any dysphagia but does admit to some occasional heartburn.  Type of Study: Bedside swallow evaluation Diet Prior to this Study: Regular;Thin liquids Temperature Spikes Noted: Yes Respiratory Status: Room air History of Intubation: No Behavior/Cognition: Alert;Cooperative Oral Cavity - Dentition: Adequate natural dentition Vision: Functional for self-feeding Patient Positioning: Upright in chair Baseline Vocal Quality: Clear Volitional Cough: Strong Volitional Swallow: Able to elicit    Oral/Motor/Sensory Function Overall Oral Motor/Sensory Function: Appears within functional limits for tasks assessed   Ice Chips Ice chips: Not tested   Thin Liquid Thin Liquid: Within functional limits Presentation: Cup;Self Fed;Straw    Nectar Thick     Honey Thick     Puree     Solid Solid: Within functional limits Presentation: Self Fed;Spoon  Dhrithi Riche, Riley Nearing 10/30/2011,1:28 PM

## 2011-10-30 NOTE — Clinical Social Work Psychosocial (Signed)
     Clinical Social Work Department BRIEF PSYCHOSOCIAL ASSESSMENT 10/30/2011  Patient:  SMILEY, BIRR     Account Number:  [id number]     Admit date:  10/22/2011  Clinical Social Worker:  Robin Searing  Date/Time:  10/30/2011 01:43 PM  Referred by:  Physician  Date Referred:  10/29/2011 Referred for  SNF Placement   Other Referral:   Interview type:  Patient Other interview type:   Wife and son also at bedside    PSYCHOSOCIAL DATA Living Status:  FAMILY Admitted from facility:   Level of care:   Primary support name:  wife and son Primary support relationship to patient:  FAMILY Degree of support available:   good    CURRENT CONCERNS Current Concerns  Post-Acute Placement   Other Concerns:    SOCIAL WORK ASSESSMENT / PLAN Patient and family are in agreement to consider ST-SNF at d/c for patient to gain more strength, safety and independence prior to d/c home s/p CABG. They reside in [place] and prefer [facility].   Assessment/plan status:  Other - See comment Other assessment/ plan:   proceed with FL2 and [place] for SNF search   Information/referral to community resources:    PATIENTS/FAMILYS RESPONSE TO PLAN OF CARE: Patient and family agreeable and appreciative to CSW assistance with d/c plans.

## 2011-10-31 ENCOUNTER — Inpatient Hospital Stay (HOSPITAL_COMMUNITY): Payer: Medicare HMO

## 2011-10-31 LAB — GLUCOSE, CAPILLARY
Glucose-Capillary: 140 mg/dL — ABNORMAL HIGH (ref 70–99)
Glucose-Capillary: 130 mg/dL — ABNORMAL HIGH (ref 70–99)
Glucose-Capillary: 161 mg/dL — ABNORMAL HIGH (ref 70–99)
Glucose-Capillary: 139 mg/dL — ABNORMAL HIGH (ref 70–99)
Glucose-Capillary: 134 mg/dL — ABNORMAL HIGH (ref 70–99)

## 2011-10-31 LAB — CBC
MCV: 102.1 fL — ABNORMAL HIGH (ref 78.0–100.0)
Platelets: 264 10*3/uL (ref 150–400)
RBC: 2.81 MIL/uL — ABNORMAL LOW (ref 4.22–5.81)
RDW: 16.1 % — ABNORMAL HIGH (ref 11.5–15.5)
WBC: 13.6 10*3/uL — ABNORMAL HIGH (ref 4.0–10.5)

## 2011-10-31 LAB — BASIC METABOLIC PANEL
CO2: 25 mEq/L (ref 19–32)
Calcium: 8.7 mg/dL (ref 8.4–10.5)
Creatinine, Ser: 1.15 mg/dL (ref 0.50–1.35)
GFR calc non Af Amer: 58 mL/min — ABNORMAL LOW (ref >90)
Sodium: 136 mEq/L (ref 135–145)

## 2011-10-31 MED ORDER — METOPROLOL TARTRATE 25 MG PO TABS
25.0000 mg | ORAL_TABLET | Freq: Two times a day (BID) | ORAL | Status: DC
  Administered 2011-10-31 – 2011-11-03 (×6): 25 mg via ORAL
  Filled 2011-10-31 (×7): qty 1

## 2011-10-31 NOTE — Progress Notes (Signed)
SNF bed selected by patient and his wife at [facility]. Will follow up Monday for ?d/c pending MD orders. Patient and family very pleased with this option/location as it is very close to their home. Reece Levy, MSW, Theresia Majors [phone]

## 2011-10-31 NOTE — Progress Notes (Signed)
CARDIAC REHAB PHASE I   PRE:  Rate/Rhythm: 75 SR    BP: sitting 115/51    SaO2: 93 RA  MODE:  Ambulation: 550 ft   POST:  Rate/Rhythm: 106 ST with PACs    BP: sitting 160/82     SaO2: 96 RA  Tolerated well assist x1 with RW. Mod assist to stand. x2 rest breaks due to DOE. Has a wheeze and rattling cough. To bed in incessant coughing. BP up. HR only 106 walking with many PACs.  [id number]  Harriet Masson CES, ACSM

## 2011-10-31 NOTE — Progress Notes (Signed)
Physical Therapy Treatment Patient Details Name: Nathaniel Woods MRN: [id number] DOB: 04-08-1929 Today's Date: 10/31/2011 Time: 1016-1040 PT Time Calculation (min): 24 min  PT Assessment / Plan / Recommendation Comments on Treatment Session  Pt s/p CABG who continues progressing with mobility although requires encouragement for participation and very upset over breakfast tray being removed from room before eating this morning. Pt offered snack but denied it. Pt with HR 130 with ambulation asymptomatic with immediate return to 105 once seated. Pt educated for sternal precautions as still only able to recall 2 of them.     Follow Up Recommendations       Barriers to Discharge        Equipment Recommendations       Recommendations for Other Services    Frequency     Plan Discharge plan remains appropriate;Frequency remains appropriate    Precautions / Restrictions Precautions Precautions: Sternal;Fall   Pertinent Vitals/Pain HR up to 130 RLE chronic pain just distal to patella, RN aware    Mobility  Transfers Transfers: Sit to Stand;Stand to Sit Sit to Stand: 4: Min assist Stand to Sit: 4: Min guard Details for Transfer Assistance: cueing for hand placement, safety and anterior translation Ambulation/Gait Ambulation/Gait Assistance: 5: Supervision Ambulation Distance (Feet): 400 Feet Assistive device: Rolling walker Ambulation/Gait Assistance Details: cueing to decrease speed and improve breathing technique in an attempt to decrease HR with one standing rest Gait Pattern: Within Functional Limits Gait velocity: decreased    Exercises General Exercises - Lower Extremity Long Arc Quad: AROM;Right;Left;5 reps;15 reps Hip Flexion/Marching: AROM;Both;20 reps;Seated   PT Diagnosis:    PT Problem List:   PT Treatment Interventions:     PT Goals Acute Rehab PT Goals PT Goal: Sit to Stand - Progress: Progressing toward goal PT Goal: Stand to Sit - Progress: Progressing toward  goal Pt will Ambulate: >150 feet;with modified independence;with least restrictive assistive device PT Goal: Ambulate - Progress: Updated due to goal met PT Goal: Up/Down Stairs - Progress: Progressing toward goal Additional Goals PT Goal: Additional Goal #1 - Progress: Progressing toward goal  Visit Information  Last PT Received On: 10/31/11 Assistance Needed: +1    Subjective Data  Subjective: This has been too busy this morning   Cognition  Overall Cognitive Status: Appears within functional limits for tasks assessed/performed Arousal/Alertness: Awake/alert Orientation Level: Appears intact for tasks assessed Behavior During Session: [facility] for tasks performed    Balance     End of Session PT - End of Session Equipment Utilized During Treatment: Gait belt Activity Tolerance: Patient tolerated treatment well Patient left: in chair;with call bell/phone within reach Nurse Communication: Mobility status    Delorse Lek 10/31/2011, 10:45 AM Delaney Meigs, PT [phone]

## 2011-10-31 NOTE — Progress Notes (Addendum)
7 Days Post-Op Procedure(s) (LRB): CORONARY ARTERY BYPASS GRAFTING (CABG) (N/A)  Subjective: Nathaniel Woods has no complaints this morning.    Objective: Vital signs in last 24 hours: Temp:  [98 F (36.7 C)-99.8 F (37.7 C)] 98 F (36.7 C) (05/24 0547) Pulse Rate:  [82-101] 82  (05/24 0547) Cardiac Rhythm:  [-] Atrial fibrillation (05/23 1930) Resp:  [16-18] 18  (05/24 0547) BP: (137-152)/(63-73) 137/72 mmHg (05/24 0547) SpO2:  [90 %-94 %] 90 % (05/24 0547) Weight:  [226 lb (102.513 kg)] 226 lb (102.513 kg) (05/24 0547)  Intake/Output from previous day: 05/23 0701 - 05/24 0700 In: 120 [P.O.:120] Out: 1975 [Urine:1975]   General appearance: alert, cooperative and no distress Heart: regular rate and rhythm Lungs: clear to auscultation bilaterally Abdomen: soft, non-tender; bowel sounds normal; no masses,  no organomegaly Extremities: edema 2+ Wound: clean and dry  Lab Results:  Basename 10/31/11 0533  WBC 13.6*  HGB 9.5*  HCT 28.7*  PLT 264   BMET:  Basename 10/31/11 0533  NA 136  K 4.2  CL 102  CO2 25  GLUCOSE 130*  BUN 16  CREATININE 1.15  CALCIUM 8.7    PT/INR:  Basename 10/29/11 0700  LABPROT 14.9  INR 1.15   ABG    Component Value Date/Time   PHART 7.383 10/24/2011 2130   HCO3 21.9 10/24/2011 2130   TCO2 19 10/25/2011 1654   ACIDBASEDEF 3.0* 10/24/2011 2130   O2SAT 97.0 10/24/2011 2130   CBG (last 3)   Basename 10/31/11 0545 10/30/11 2100 10/30/11 1629  GLUCAP 130* 140* 166*    Assessment/Plan: S/P Procedure(s) (LRB): CORONARY ARTERY BYPASS GRAFTING (CABG) (N/A)  2. CV- H/O A. Fib, currently NSR, on Lopressor and Amiodarone 3. Pulmonary- continue IS 4. Volume Overload- patient with good urinary output with Lasix, weight is up 6kg since admission 5. Actue post operative anemis- Hgb stable at 9.5 6. DM-CBGs, moderately controlled, will continue home Metformin 7. Deconditioning- patient will require SNF, per PT/OT recs 8. DIspo- patient is  doing better.  CSW has confirmed SNF bed for Monday,   LOS: 9 days    Woods, Nathaniel 10/31/2011  patient examined and medical record reviewed,agree with above note. Nathaniel Woods,Nathaniel Woods 10/31/2011

## 2011-11-01 LAB — GLUCOSE, CAPILLARY
Glucose-Capillary: 160 mg/dL — ABNORMAL HIGH (ref 70–99)
Glucose-Capillary: 126 mg/dL — ABNORMAL HIGH (ref 70–99)

## 2011-11-01 LAB — CLOSTRIDIUM DIFFICILE BY PCR: Toxigenic C. Difficile by PCR: NEGATIVE

## 2011-11-01 MED ORDER — DM-GUAIFENESIN ER 30-600 MG PO TB12: 1.0000 | ORAL_TABLET | Freq: Two times a day (BID) | ORAL | Status: AC

## 2011-11-01 MED ORDER — METOPROLOL TARTRATE 25 MG PO TABS: 25.0000 mg | ORAL_TABLET | Freq: Two times a day (BID) | ORAL | Status: DC

## 2011-11-01 MED ORDER — FLUTICASONE-SALMETEROL 250-50 MCG/DOSE IN AEPB: 1.0000 | INHALATION_SPRAY | Freq: Two times a day (BID) | RESPIRATORY_TRACT | Status: DC

## 2011-11-01 NOTE — Progress Notes (Signed)
CARDIAC REHAB PHASE I   PRE:  Rate/Rhythm: 71 SR    BP: sitting 115/58    SaO2:   MODE:  Ambulation: 650 ft   POST:  Rate/Rhythm: 93 SR    BP: sitting 149/91     SaO2: 96 RA  Came to walk and ed pt. Very upset re: no D/C on Mon (sts MD told him it would be tues since no SNF take pts on Mon). Pt very insistent on this is the facts now, doesn't understand why he was told one thing and thing it is not true. Tried to tell pt there is a potential he could still D/C on Mon but he would not listen to this. Pt refused walking.  Let pt vent for a few minutes then did ed. Voiced understanding. As I was leaving pt said he would walk. Did very well with no wheezing today, HR better as well, no PACs. Happier after we walked. Duration 12 min. [id number]  Harriet Masson CES, ACSM

## 2011-11-01 NOTE — Progress Notes (Addendum)
8 Days Post-Op Procedure(s) (LRB): CORONARY ARTERY BYPASS GRAFTING (CABG) (N/A)  Subjective:  Mr. Cavallero complains of some diarrhea this morning.  He states he can't control it, just "explodes"  Patient request all stool softeners be discontinued  Objective: Vital signs in last 24 hours: Temp:  [97.8 F (36.6 C)-99.8 F (37.7 C)] 99.8 F (37.7 C) (05/25 0530) Pulse Rate:  [76-93] 79  (05/25 0530) Cardiac Rhythm:  [-] Normal sinus rhythm (05/25 0936) Resp:  [18-24] 24  (05/25 0530) BP: (115-140)/(51-67) 140/65 mmHg (05/25 0530) SpO2:  [91 %-95 %] 91 % (05/25 0530) Weight:  [218 lb 11.1 oz (99.2 kg)] 218 lb 11.1 oz (99.2 kg) (05/25 0530)  Intake/Output from previous day: 05/24 0701 - 05/25 0700 In: 480 [P.O.:480] Out: 850 [Urine:850] Intake/Output this shift: Total I/O In: 240 [P.O.:240] Out: -   General appearance: alert, cooperative and no distress Heart: regular rate and rhythm Lungs: clear to auscultation bilaterally Abdomen: soft, non-tender; bowel sounds normal; no masses,  no organomegaly Extremities: edema 1-2+ Wound: clean and dry  Lab Results:  Basename 10/31/11 0533  WBC 13.6*  HGB 9.5*  HCT 28.7*  PLT 264   BMET:  Basename 10/31/11 0533  NA 136  K 4.2  CL 102  CO2 25  GLUCOSE 130*  BUN 16  CREATININE 1.15  CALCIUM 8.7    PT/INR: No results found for this basename: LABPROT,INR in the last 72 hours ABG    Component Value Date/Time   PHART 7.383 10/24/2011 2130   HCO3 21.9 10/24/2011 2130   TCO2 19 10/25/2011 1654   ACIDBASEDEF 3.0* 10/24/2011 2130   O2SAT 97.0 10/24/2011 2130   CBG (last 3)   Basename 11/01/11 0632 10/31/11 2151 10/31/11 1628  GLUCAP 160* 134* 139*    Assessment/Plan: S/P Procedure(s) (LRB): CORONARY ARTERY BYPASS GRAFTING (CABG) (N/A)  2. CV- H/O A. Fib, maintaining NSR currenlty, on Lopressor and Amiodarone 3. Pulmonary- continue IS 4. Diarrhea- will d/c all stool softeners, will check C. Diff 5. DM-CBGs, controlled,  will continue home Metformin 6. Deconditioning- cont PT/OT, recs SNF 7. Dispo- patient doing well, now with diarrhea, will await C. Diff results, bed arranaged for SNF on Monday   LOS: 10 days    BARRETT, ERIN 11/01/2011   patient examined and medical record reviewed,agree with above note. VAN TRIGT III,Bode Pieper 11/01/2011

## 2011-11-02 DIAGNOSIS — I714 Abdominal aortic aneurysm, without rupture: Secondary | ICD-10-CM

## 2011-11-02 DIAGNOSIS — I10 Essential (primary) hypertension: Secondary | ICD-10-CM

## 2011-11-02 DIAGNOSIS — I2510 Atherosclerotic heart disease of native coronary artery without angina pectoris: Secondary | ICD-10-CM

## 2011-11-02 DIAGNOSIS — E119 Type 2 diabetes mellitus without complications: Secondary | ICD-10-CM

## 2011-11-02 DIAGNOSIS — Z951 Presence of aortocoronary bypass graft: Secondary | ICD-10-CM

## 2011-11-02 DIAGNOSIS — Z8679 Personal history of other diseases of the circulatory system: Secondary | ICD-10-CM

## 2011-11-02 LAB — GLUCOSE, CAPILLARY
Glucose-Capillary: 111 mg/dL — ABNORMAL HIGH (ref 70–99)
Glucose-Capillary: 123 mg/dL — ABNORMAL HIGH (ref 70–99)
Glucose-Capillary: 124 mg/dL — ABNORMAL HIGH (ref 70–99)
Glucose-Capillary: 150 mg/dL — ABNORMAL HIGH (ref 70–99)
Glucose-Capillary: 105 mg/dL — ABNORMAL HIGH (ref 70–99)

## 2011-11-02 MED ORDER — ALPRAZOLAM 0.5 MG PO TABS
0.5000 mg | ORAL_TABLET | Freq: Two times a day (BID) | ORAL | Status: DC | PRN
  Administered 2011-11-02: 0.5 mg via ORAL
  Filled 2011-11-02: qty 1

## 2011-11-02 NOTE — Progress Notes (Addendum)
9 Days Post-Op Procedure(s) (LRB): CORONARY ARTERY BYPASS GRAFTING (CABG) (N/A)  Subjective: Mr. Plemmons is having issues with his anxiety this morning.  He states that he is upset about possibly having to be here until Tuesday.   Objective: Vital signs in last 24 hours: Temp:  [98.9 F (37.2 C)-99.4 F (37.4 C)] 99.4 F (37.4 C) (05/26 0326) Pulse Rate:  [80-83] 81  (05/26 0326) Cardiac Rhythm:  [-] Normal sinus rhythm (05/25 2005) Resp:  [18-20] 20  (05/26 0326) BP: (115-149)/(58-72) 122/70 mmHg (05/26 0326) SpO2:  [92 %-94 %] 94 % (05/26 0326) Weight:  [217 lb (98.431 kg)] 217 lb (98.431 kg) (05/26 0326)  Intake/Output from previous day: 05/25 0701 - 05/26 0700 In: 960 [P.O.:960] Out: 1500 [Urine:1500] Intake/Output this shift: Total I/O In: 240 [P.O.:240] Out: 300 [Urine:300]  General appearance: alert, cooperative and no distress Heart: regular rate and rhythm Lungs: clear to auscultation bilaterally Abdomen: soft, non-tender; bowel sounds normal; no masses,  no organomegaly Extremities: edema 1+ Wound: clean and dry  Lab Results:  Basename 10/31/11 0533  WBC 13.6*  HGB 9.5*  HCT 28.7*  PLT 264   BMET:  Basename 10/31/11 0533  NA 136  K 4.2  CL 102  CO2 25  GLUCOSE 130*  BUN 16  CREATININE 1.15  CALCIUM 8.7    PT/INR: No results found for this basename: LABPROT,INR in the last 72 hours ABG    Component Value Date/Time   PHART 7.383 10/24/2011 2130   HCO3 21.9 10/24/2011 2130   TCO2 19 10/25/2011 1654   ACIDBASEDEF 3.0* 10/24/2011 2130   O2SAT 97.0 10/24/2011 2130   CBG (last 3)   Basename 11/01/11 1116 11/01/11 0632 10/31/11 2151  GLUCAP 126* 160* 134*    Assessment/Plan: S/P Procedure(s) (LRB): CORONARY ARTERY BYPASS GRAFTING (CABG) (N/A)  2.CV- H/O A. Fib, NSR currenlty, on Lopressor and Amiodarone 3.Pulmonary- no issues continue IS 4. Diarrhea- improved, continue to hold all stool softeners, C. Diff negative 5. Deconditioning-SNF per  PT/OT 6. Dispo- patient doing well.  Hopeful d/c to SNF tomorrow    LOS: 11 days    Lowella Dandy 11/02/2011   patient examined and medical record reviewed,agree with above note. VAN TRIGT III,Romayne Ticas 11/02/2011

## 2011-11-02 NOTE — Discharge Summary (Signed)
patient examined and medical record reviewed,agree with above note. VAN TRIGT III,Thaddaeus Granja 11/02/2011    

## 2011-11-02 NOTE — Progress Notes (Signed)
Pt ambulated 350 ft with rolling walker and tolerated activity well. Will continue to monitor and encourage.

## 2011-11-02 NOTE — Progress Notes (Signed)
Called dr. Zenaida Niece trigt made him aware that pt is having anxiety, and request something for it. Orders were given, will continue to monitor.

## 2011-11-02 NOTE — Discharge Summary (Signed)
Physician Discharge Summary  Patient ID: Nathaniel Woods MRN: [id number] DOB/AGE: 1928-09-03 78 y.o.  Admit date: 10/22/2011 Discharge date: 11/02/2011  Admission Diagnoses:  Patient Active Problem List  Diagnoses  . Hemorrhagic stroke  . Hyperlipidemia  . DM (diabetes mellitus)  . CAD in native artery  . HTN (hypertension)  . Abdominal aortic aneurysm  . H/O Ventricular Fibrillation  . S/P CABG x 4    Discharge Diagnoses:   Patient Active Problem List  Diagnoses  . Hemorrhagic stroke  . Hyperlipidemia  . DM (diabetes mellitus)  . CAD in native artery  . HTN (hypertension)  . Abdominal aortic aneurysm  . H/O Ventricular Fibrillation  . S/P CABG x 4   Discharged Condition: good  Hospital Course:   Nathaniel Woods is an 78 yo white male who was accepted as a transfer from [facility] on 10/22/2011.  The patient has a known history of myocardial infarction with PCI to RCA, LAD, and Diagonal back in the 90s.  The patient had been in good health until the night of 10/19/2011 at which point he developed severe chest discomfort at rest.  The patient went to bed but continued to have pain throughout the evening.  The next day his symptoms escalated and his wife contacted EMS.  The patient was originally brought to [facility] ED, however he requested to be seen at [facility] because his primary cardiologist was there.  Workup revealed the patient to have suffered an acute NSTEMI.  He was admitted for further evaluation.  He underwent cardiac catheterization on 10/20/2011 by Dr. Darrold Junker which revealed a 90% in stent stenosis of his LAD, 60% stenosis at the site of the stent in the diagonal artery,  a 75% stenosis of the patients Obtuse marginal, and a totally occluded right.  The patient had a preserved EF.  Due to these findings [facility]CTS was consulted for possible bypass surgery.  The patient remained pain free post catheterization.  He was accepted by Dr. Morton Peters and upon  arrival was worked up for coronary bypass.  Dr. Donata Clay spoke with the patient regarding the risks and benefits of proceeding with surgery.  He was agreeable to proceed with coronary bypass.  The patient was scheduled for bypass surgery on 10/24/2011. HD #1 the patient remained chest pain free.  HD #2 the patient was taken to the operating room and underwent CABG x4 utilizing LIMA to LAD, SVG to Diagonal, SVG to OM and SVG to PDA, and Endoscopic Saphenous Vein Harvest from the right and left thigh.  The patient tolerated the procedure well and was taken to the SICU in stable condition.  POD #0 the patient was extubated.  POD #1 the patient was weaned off cardiovascular drips as tolerated.  He had good glycemic control with insulin drip.  POD #2 the patient developed acute post operative blood loss anemia.  He was transfused 2U of PRBCs.  He was pacer dependant and his amiodarone drip was discontinued.  He remained on Dopamine and Neo drips.  POD #3 the patient was in slow A. Fib on pacer.  Patient was placed on Niferex for anemia.  POD #4 the patient remained in A. Fib, with atrial pacing set at 90.  The patient developed leukocytosis, UA was obtained and was negative. The patient was evaluated by PT who recommended SNF at discharge.  POD #5 the patient has converted and is maintaining NSR.  POD #6 patient had brief episodes of A. Fib.  EKG  was obtained and showed NSR with PACs.  POD #7 the patient is maintaining NSR.  He was restarted on his home Metformin.  POD #8 the patient complains of diarrhea.  C. Diff toxin was negative.  POD #9 the patient is doing very well.  He is maintaining NSR and will remain on Lopressor and Amiodarone. He is medically stable at this time and will be discharged to a SNF tomorrow 11/03/2011.  The patient is to follow up with Dr. Donata Clay on 11/14/2011 at 1:00 pm with chest xray prior to appointment.  He is also to follow up with his Cardiologist Dr. Darrold Junker in 2 weeks.  Disposition:  [facility] Not Defined  Discharge Orders    Future Appointments: Provider: Department: Dept Phone: Center:   11/14/2011 1:00 PM Kerin Perna, MD [facility] [phone] [facility]     Medication List  As of 11/02/2011  1:34 PM   STOP taking these medications         atenolol 50 MG tablet      lisinopril 20 MG tablet         TAKE these medications         amiodarone 200 MG tablet   Commonly known as: PACERONE   Take 1 tablet (200 mg total) by mouth daily.      aspirin 81 MG EC tablet   Take 1 tablet (81 mg total) by mouth daily.      BIOTIN 5000 PO   Take 1 tablet by mouth daily.      CoQ10 50 MG Caps   Take 50 mg by mouth daily.      CRESTOR PO   Take 2.5 mg by mouth every Monday, Wednesday, and Friday.      dextromethorphan-guaiFENesin 30-600 MG per 12 hr tablet   Commonly known as: MUCINEX DM   Take 1 tablet by mouth 2 (two) times daily.      diphenhydrAMINE 25 mg capsule   Commonly known as: BENADRYL   Take 1 capsule (25 mg total) by mouth at bedtime as needed for sleep.      finasteride 5 MG tablet   Commonly known as: PROSCAR   Take 5 mg by mouth daily.      Fish Oil 1200 MG Caps   Take 2 capsules by mouth daily.      Fluticasone-Salmeterol 250-50 MCG/DOSE Aepb   Commonly known as: ADVAIR   Inhale 1 puff into the lungs 2 (two) times daily.      furosemide 40 MG tablet   Commonly known as: LASIX   Take 1 tablet (40 mg total) by mouth daily. For 7 days      iron polysaccharides 150 MG capsule   Commonly known as: NIFEREX   Take 1 capsule (150 mg total) by mouth daily.      LUTEIN PO   Take 1 tablet by mouth daily.      metFORMIN 500 MG tablet   Commonly known as: GLUCOPHAGE   Take 500 mg by mouth daily with breakfast.      metoprolol tartrate 25 MG tablet   Commonly known as: LOPRESSOR   Take 1 tablet (25 mg total) by mouth 2 (two) times daily.      mulitivitamin with minerals Tabs   Take 1 tablet by mouth daily.       potassium chloride SA 20 MEQ tablet   Commonly known as: K-DUR,KLOR-CON   Take 1 tablet (20 mEq total) by mouth daily.  For 7 days      traMADol 50 MG tablet   Commonly known as: ULTRAM   Take 1 tablet (50 mg total) by mouth every 6 (six) hours as needed.           Follow-up Information    Follow up with VAN Dinah Beers, MD on 11/14/2011. (Appointment at 1:00 pm)    Contact information:   [address] [place] [address] [phone]       Follow up with [facility] on 11/14/2011. (please get chest xray one hour prior to appointment with Dr. Donata Clay)    Contact information:   [address], [address]      Follow up with Marcina Millard, MD. Schedule an appointment as soon as possible for a visit in 2 weeks.   Contact information:   [address] [place] [address] [phone]          Signed: Lowella Dandy 11/02/2011, 1:34 PM

## 2011-11-03 ENCOUNTER — Encounter: Payer: Self-pay | Admitting: Internal Medicine

## 2011-11-03 LAB — GLUCOSE, CAPILLARY
Glucose-Capillary: 117 mg/dL — ABNORMAL HIGH (ref 70–99)
Glucose-Capillary: 129 mg/dL — ABNORMAL HIGH (ref 70–99)

## 2011-11-03 MED ORDER — LISINOPRIL 5 MG PO TABS
5.0000 mg | ORAL_TABLET | Freq: Every day | ORAL | Status: DC
Start: 2011-11-03 — End: 2011-11-03
  Administered 2011-11-03: 5 mg via ORAL
  Filled 2011-11-03: qty 1

## 2011-11-03 MED ORDER — LISINOPRIL 5 MG PO TABS: 5.0000 mg | ORAL_TABLET | Freq: Every day | ORAL | Status: DC

## 2011-11-03 NOTE — Progress Notes (Signed)
D/c EPW and chest tube sutures per MD order and policy; no bleeding noted; steri strips applied to chest tube site; pt bedrest for 1 hr; will cont. To monitor.

## 2011-11-03 NOTE — Progress Notes (Signed)
Pt ambulated in hallway x1 assist, tolerated well will contiue to monitor. Seirra Kos, Randall An RN

## 2011-11-03 NOTE — Progress Notes (Signed)
10 Days Post-Op Procedure(s) (LRB): CORONARY ARTERY BYPASS GRAFTING (CABG) (N/A) Subjective:CABG unstable angina, 3 V CAD  Objective: Vital signs in last 24 hours: Temp:  [97.6 F (36.4 C)-100.3 F (37.9 C)] 97.6 F (36.4 C) (05/27 0619) Pulse Rate:  [71-91] 82  (05/27 0619) Cardiac Rhythm:  [-] Normal sinus rhythm (05/27 0810) Resp:  [18] 18  (05/27 0619) BP: (117-134)/(53-72) 134/72 mmHg (05/27 0619) SpO2:  [90 %-95 %] 90 % (05/27 0756) Weight:  [212 lb 9.6 oz (96.435 kg)] 212 lb 9.6 oz (96.435 kg) (05/27 0619)  Hemodynamic parameters for last 24 hours:  NSR this am  Intake/Output from previous day: 05/26 0701 - 05/27 0700 In: 480 [P.O.:480] Out: 500 [Urine:500] Intake/Output this shift: Total I/O In: 240 [P.O.:240] Out: 200 [Urine:200]  Incisions cleaean  Lab Results: No results found for this basename: WBC:2,HGB:2,HCT:2,PLT:2 in the last 72 hours BMET: No results found for this basename: NA:2,K:2,CL:2,CO2:2,GLUCOSE:2,BUN:2,CREATININE:2,CALCIUM:2 in the last 72 hours  PT/INR: No results found for this basename: LABPROT,INR in the last 72 hours ABG    Component Value Date/Time   PHART 7.383 10/24/2011 2130   HCO3 21.9 10/24/2011 2130   TCO2 19 10/25/2011 1654   ACIDBASEDEF 3.0* 10/24/2011 2130   O2SAT 97.0 10/24/2011 2130   CBG (last 3)   Basename 11/03/11 0024 11/02/11 2145 11/02/11 1636  GLUCAP 129* 117* 105*    Assessment/Plan: S/P Procedure(s) (LRB): CORONARY ARTERY BYPASS GRAFTING (CABG) (N/A) Start lisinopril 5mg  daily Ready for SNF   LOS: 12 days    Nathaniel Woods,Nathaniel Woods 11/03/2011

## 2011-11-03 NOTE — Progress Notes (Signed)
Patient discharge to SNF via (ambulance  11/03/2011 1:10 PM. All necessary paperwork sent with patient to facility. Shatavia Santor, [employer]

## 2011-11-04 LAB — GLUCOSE, CAPILLARY
Glucose-Capillary: 110 mg/dL — ABNORMAL HIGH (ref 70–99)
Glucose-Capillary: 110 mg/dL — ABNORMAL HIGH (ref 70–99)

## 2011-11-08 ENCOUNTER — Encounter: Payer: Self-pay | Admitting: Internal Medicine

## 2011-11-10 ENCOUNTER — Other Ambulatory Visit: Payer: Self-pay | Admitting: Cardiothoracic Surgery

## 2011-11-10 DIAGNOSIS — I2510 Atherosclerotic heart disease of native coronary artery without angina pectoris: Secondary | ICD-10-CM

## 2011-11-12 ENCOUNTER — Encounter: Payer: Self-pay | Admitting: Cardiothoracic Surgery

## 2011-11-14 ENCOUNTER — Ambulatory Visit
Admission: RE | Admit: 2011-11-14 | Discharge: 2011-11-14 | Disposition: A | Discharge status: Home / Self Care | Payer: Medicare HMO | Source: Ambulatory Visit | Attending: Cardiothoracic Surgery | Admitting: Cardiothoracic Surgery

## 2011-11-14 ENCOUNTER — Encounter: Payer: Self-pay | Admitting: Cardiothoracic Surgery

## 2011-11-14 ENCOUNTER — Ambulatory Visit (INDEPENDENT_AMBULATORY_CARE_PROVIDER_SITE_OTHER): Payer: Self-pay | Admitting: Cardiothoracic Surgery

## 2011-11-14 VITALS — BP 122/69 | HR 74 | Resp 16 | Ht 75.0 in | Wt 215.0 lb

## 2011-11-14 DIAGNOSIS — I2510 Atherosclerotic heart disease of native coronary artery without angina pectoris: Secondary | ICD-10-CM

## 2011-11-14 DIAGNOSIS — E119 Type 2 diabetes mellitus without complications: Secondary | ICD-10-CM

## 2011-11-14 DIAGNOSIS — Z951 Presence of aortocoronary bypass graft: Secondary | ICD-10-CM

## 2011-11-14 NOTE — Patient Instructions (Signed)
The patient may shower The patient may lift up to 15 pounds The patient may take a road trip to anywhere in state.

## 2011-11-14 NOTE — Progress Notes (Signed)
PCP is Marcina Millard, MD, MD Referring Provider is Marcina Millard, MD  Chief Complaint  Patient presents with  . Routine Post Op    3 week f/u from surgery with CXR, S/P CABG x 4 on 10/24/11     HPI: The patient is a 78 year old Woods who returns for his first office visit after undergoing urgent CABG x4 after he presented with unstable angina and severe three-vessel CAD. His cardiologist is in [place] Dr. Jamse Mead. Postop the patient had  atrial fibrillation,  fluid retention, obstructive urinary symptoms, insomnia and deconditioning. He was discharged to a skilled nursing facility in [facility]. He is now back at home. He is receiving home physical therapy. He is able to ambulate several 100 feet. Incisions are healing well. He has no symptoms of angina or CHF. He complains mainly of insomnia.   Past Medical History  Diagnosis Date  . Hemorrhagic stroke 09/2008    S/P fall  . Hypertension   . Hyperlipidemia   . Kidney stone   . Stroke   . Coronary artery disease   . Angina   . Inferior MI 10/2011  . Type II diabetes mellitus   . BPH associated with nocturia   . Arthritis   . Anxiety     "just before major surgery"    Past Surgical History  Procedure Date  . Tonsillectomy 1944  . Appendectomy 1947  . Intertrochanteric hip fracture surgery 05/1991    "pin, 2 screws, plate"; right  . Lithotripsy ~ 04/2011  . Coronary angioplasty with stent placement 2009    "2; made total of 6"  . Cardiac catheterization 10/2011  . Vasectomy ~ 1970  . Coronary artery bypass graft 10/24/2011    Procedure: CORONARY ARTERY BYPASS GRAFTING (CABG);  Surgeon: Kerin Perna, MD;  Location: [facility] OR;  Service: Open Heart Surgery;  Laterality: N/A;  Coronary Artery Bypass Graft times four using the left internal mammary artery and the right and left greater saphenous veins harvested endoscopically.  transesophageal Echocardiogram    No family history on  file.  Social History History  Substance Use Topics  . Smoking status: Former Smoker -- 1.0 packs/day for 24 years    Types: Cigarettes    Quit date: 06/09/1970  . Smokeless tobacco: Never Used  . Alcohol Use: 16.8 oz/week    28 Shots of liquor per week    Current Outpatient Prescriptions  Medication Sig Dispense Refill  . aspirin EC 81 MG EC tablet Take 1 tablet (81 mg total) by mouth daily.  30 tablet  1  . atenolol (TENORMIN) 50 MG tablet Take 50 mg by mouth daily.       Marland Kitchen BIOTIN 5000 PO Take 1 tablet by mouth daily.      . Coenzyme Q10 (COQ10) 50 MG CAPS Take 50 mg by mouth daily.       . finasteride (PROSCAR) 5 MG tablet Take 5 mg by mouth daily.      Marland Kitchen lisinopril (PRINIVIL,ZESTRIL) 5 MG tablet Take 20 mg by mouth daily.      . LUTEIN PO Take 1 tablet by mouth daily.      . metFORMIN (GLUCOPHAGE) 500 MG tablet Take 500 mg by mouth daily with breakfast.      . Multiple Vitamin (MULITIVITAMIN WITH MINERALS) TABS Take 1 tablet by mouth daily.      . Omega-3 Fatty Acids (FISH OIL) 1200 MG CAPS Take 2 capsules by mouth daily.      Marland Kitchen  Rosuvastatin Calcium (CRESTOR PO) Take 2.5 mg by mouth every Monday, Wednesday, and Friday.      Marland Kitchen DISCONTD: metoprolol tartrate (LOPRESSOR) 25 MG tablet Take 1 tablet (25 mg total) by mouth 2 (two) times daily.  60 tablet  1  . amiodarone (PACERONE) 200 MG tablet Take 1 tablet (200 mg total) by mouth daily.  30 tablet  1  . diphenhydrAMINE (BENADRYL) 25 mg capsule Take 1 capsule (25 mg total) by mouth at bedtime as needed for sleep.  30 capsule    . dutasteride (AVODART) 0.5 MG capsule Take 0.5 mg by mouth daily.      . Fluticasone-Salmeterol (ADVAIR) 250-50 MCG/DOSE AEPB Inhale 1 puff into the lungs 2 (two) times daily.  60 each  1  . furosemide (LASIX) 40 MG tablet Take 1 tablet (40 mg total) by mouth daily. For 7 days  7 tablet  0  . iron polysaccharides (NIFEREX) 150 MG capsule Take 1 capsule (150 mg total) by mouth daily.  30 capsule  0  .  metoprolol tartrate (LOPRESSOR) 25 MG tablet Take 50 mg by mouth 1 day or 1 dose.      . potassium chloride SA (K-DUR,KLOR-CON) 20 MEQ tablet Take 1 tablet (20 mEq total) by mouth daily. For 7 days  7 tablet  0    Allergies  Allergen Reactions  . Aspirin     Hemorrhagic Stroke  . Decongestant (Pseudoephedrine Hcl)     "don't remember"  . Contrast Media (Iodinated Diagnostic Agents) Rash    Review of Systems the bottoms of his feet are sensitive and burning. No prior history of neuropathy  BP 122/69  Pulse 74  Resp 16  Ht 6\' 3"  (1.905 m)  Wt 215 lb (97.523 kg)  BMI 26.87 kg/m2  SpO2 95% Physical Exam General alert and pleasant HEENT normocephalic Thorax well-healed sternal incision, clear breath sounds Cardiac regular rhythm without murmur or gallop or rub Extremities in the vein harvest incisions healing, bilateral mild thigh hematoma from Endo vein harvest no evidence of cellulitis Diagnostic Tests: Chest x-ray today shows clear lung fields no pleural effusion sternal wires intact Impression: Stable course 3 weeks after multivessel bypass grafting. The patient was given a prescription for 0.5 mg Klonopin each bedtime when necessary insomnia. He'll continue a 10 minute walk daily. He will be L. to resume driving in 2 weeks. Current medications will be continued including the amiodarone 200 mg once a day. All see him back in 3 weeks to assess his rhythm and to hopefully stop the amiodarone at that time.  Plan: Return for followup in 3 weeks. notify the office of any changes.

## 2011-12-03 ENCOUNTER — Encounter: Payer: Self-pay | Admitting: Cardiothoracic Surgery

## 2011-12-10 ENCOUNTER — Encounter: Payer: Self-pay | Admitting: Cardiothoracic Surgery

## 2011-12-10 ENCOUNTER — Ambulatory Visit (INDEPENDENT_AMBULATORY_CARE_PROVIDER_SITE_OTHER): Payer: Self-pay | Admitting: Cardiothoracic Surgery

## 2011-12-10 VITALS — BP 126/82 | HR 69 | Resp 18 | Ht 75.0 in | Wt 205.0 lb

## 2011-12-10 DIAGNOSIS — I714 Abdominal aortic aneurysm, without rupture, unspecified: Secondary | ICD-10-CM

## 2011-12-10 DIAGNOSIS — Z09 Encounter for follow-up examination after completed treatment for conditions other than malignant neoplasm: Secondary | ICD-10-CM

## 2011-12-10 DIAGNOSIS — I2510 Atherosclerotic heart disease of native coronary artery without angina pectoris: Secondary | ICD-10-CM

## 2011-12-10 NOTE — Patient Instructions (Signed)
Start outpatient cardiac rehabilitation after home PT is finished-heart strides at  [facility]

## 2011-12-10 NOTE — Progress Notes (Signed)
PCP is Marcina Millard, MD Referring Provider is Marcina Millard, MD                    [address] [place]            [place] [address]          [phone]       Chief Complaint  Patient presents with  . Routine Post Op    3 week f/u, S/P CABG x 4 on 10/24/11 , last seen 11/14/11 with cxr    HPI: The patient continues to recover from multivessel bypass grafting performed on mid may 2013 for severe three-vessel disease and unstable angina. Is now ambulating well. The incisions are healed. He is sleeping well and his overall strength and balance had improved. No recurrent angina, no recurrent CHF no edema. He was evaluated earlier this year by Dr. Levora Dredge at [facility] for a 4.6 cm AAA. Followup appointment with with Dr. Cephas Darby is later this month. I told the patient he should wait at least 3 months for any major elective procedures after his heart bypass surgery.   Past Medical History  Diagnosis Date  . Hemorrhagic stroke 09/2008    S/P fall  . Hypertension   . Hyperlipidemia   . Kidney stone   . Stroke   . Coronary artery disease   . Angina   . Inferior MI 10/2011  . Type II diabetes mellitus   . BPH associated with nocturia   . Arthritis   . Anxiety     "just before major surgery"    Past Surgical History  Procedure Date  . Tonsillectomy 1944  . Appendectomy 1947  . Intertrochanteric hip fracture surgery 05/1991    "pin, 2 screws, plate"; right  . Lithotripsy ~ 04/2011  . Coronary angioplasty with stent placement 2009    "2; made total of 6"  . Cardiac catheterization 10/2011  . Vasectomy ~ 1970  . Coronary artery bypass graft 10/24/2011    Procedure: CORONARY ARTERY BYPASS GRAFTING (CABG);  Surgeon: Kerin Perna, MD;  Location: [facility] OR;  Service: Open Heart Surgery;  Laterality: N/A;  Coronary Artery Bypass Graft times four using the left internal mammary artery and the right and left greater saphenous veins harvested endoscopically.   transesophageal Echocardiogram    No family history on file.  Social History History  Substance Use Topics  . Smoking status: Former Smoker -- 1.0 packs/day for 24 years    Types: Cigarettes    Quit date: 06/09/1970  . Smokeless tobacco: Never Used  . Alcohol Use: 16.8 oz/week    28 Shots of liquor per week    Current Outpatient Prescriptions  Medication Sig Dispense Refill  . aspirin EC 81 MG EC tablet Take 1 tablet (81 mg total) by mouth daily.  30 tablet  1  . atenolol (TENORMIN) 50 MG tablet Take 50 mg by mouth daily.       Marland Kitchen BIOTIN 5000 PO Take 1 tablet by mouth daily.      . Coenzyme Q10 (COQ10) 50 MG CAPS Take 50 mg by mouth daily.       . finasteride (PROSCAR) 5 MG tablet Take 5 mg by mouth daily.      . Fluticasone-Salmeterol (ADVAIR) 250-50 MCG/DOSE AEPB Inhale 1 puff into the lungs 2 (two) times daily.  60 each  1  . lisinopril (PRINIVIL,ZESTRIL) 5 MG tablet Take 20 mg by mouth daily.      Porfirio Mylar  PO Take 1 tablet by mouth daily.      . metFORMIN (GLUCOPHAGE) 500 MG tablet Take 500 mg by mouth daily with breakfast.      . Multiple Vitamin (MULITIVITAMIN WITH MINERALS) TABS Take 1 tablet by mouth daily.      . Omega-3 Fatty Acids (FISH OIL) 1200 MG CAPS Take 2 capsules by mouth daily.      . Rosuvastatin Calcium (CRESTOR PO) Take 2.5 mg by mouth every Monday, Wednesday, and Friday.      . diphenhydrAMINE (BENADRYL) 25 mg capsule Take 1 capsule (25 mg total) by mouth at bedtime as needed for sleep.  30 capsule      Allergies  Allergen Reactions  . Aspirin     Hemorrhagic Stroke  . Decongestant (Pseudoephedrine Hcl)     "don't remember"  . Contrast Media (Iodinated Diagnostic Agents) Rash    Review of Systems no changes  BP 126/82  Pulse 69  Resp 18  Ht 6\' 3"  (1.905 m)  Wt 205 lb (92.987 kg)  BMI 25.62 kg/m2  SpO2 94% Physical Exam Alert and comfortable Breath sounds equal Cardiac rhythm regular, normal sinus rhythm without gallop or murmur sternal  well-healed Leg incision healed no pedal edema Neuro intact  Diagnostic Tests: Note chest x-ray performed June 7 clear Impression: Stable course 6 weeks postop doing well continue current activities. He understands he should not lift more than 15 pounds until after Labor Day. Would recommend against performing a stent graft of his AAA, if indicated by size and symptoms, until several months after his heart surgery performed in May.  Plan: Return as needed

## 2012-01-19 ENCOUNTER — Encounter: Payer: Self-pay | Admitting: Cardiology

## 2012-02-08 ENCOUNTER — Encounter: Payer: Self-pay | Admitting: Cardiology

## 2012-03-09 ENCOUNTER — Encounter: Payer: Self-pay | Admitting: Cardiology

## 2012-04-15 ENCOUNTER — Ambulatory Visit: Payer: Self-pay | Admitting: Vascular Surgery

## 2012-05-19 ENCOUNTER — Ambulatory Visit: Payer: Self-pay | Admitting: Vascular Surgery

## 2012-05-19 LAB — CBC
HCT: 42.7 % (ref 40.0–52.0)
HGB: 14.4 g/dL (ref 13.0–18.0)
MCV: 103 fL — ABNORMAL HIGH (ref 80–100)
RDW: 14.1 % (ref 11.5–14.5)
WBC: 7.1 10*3/uL (ref 3.8–10.6)

## 2012-05-19 LAB — BASIC METABOLIC PANEL
BUN: 20 mg/dL — ABNORMAL HIGH (ref 7–18)
EGFR (African American): >60
Glucose: 91 mg/dL (ref 65–99)
Potassium: 4.2 mmol/L (ref 3.5–5.1)
Sodium: 140 mmol/L (ref 136–145)

## 2012-05-26 ENCOUNTER — Inpatient Hospital Stay: Payer: Self-pay | Admitting: Vascular Surgery

## 2012-05-27 LAB — CBC WITH DIFFERENTIAL/PLATELET
Basophil %: 0.5 %
Eosinophil %: 1.4 %
HCT: 37.4 % — ABNORMAL LOW (ref 40.0–52.0)
HGB: 12.4 g/dL — ABNORMAL LOW (ref 13.0–18.0)
Lymphocyte %: 14.2 %
Neutrophil #: 7.3 10*3/uL — ABNORMAL HIGH (ref 1.4–6.5)
Neutrophil %: 75.9 %
RBC: 3.6 10*6/uL — ABNORMAL LOW (ref 4.40–5.90)

## 2012-05-27 LAB — PROTIME-INR
INR: 1
Prothrombin Time: 13.1 secs (ref 11.5–14.7)

## 2012-05-27 LAB — COMPREHENSIVE METABOLIC PANEL
Albumin: 3.2 g/dL — ABNORMAL LOW (ref 3.4–5.0)
Anion Gap: 3 — ABNORMAL LOW (ref 7–16)
Calcium, Total: 8.6 mg/dL (ref 8.5–10.1)
Co2: 26 mmol/L (ref 21–32)
Osmolality: 287 (ref 275–301)
Potassium: 3.9 mmol/L (ref 3.5–5.1)
Sodium: 144 mmol/L (ref 136–145)

## 2013-05-11 ENCOUNTER — Ambulatory Visit: Payer: Self-pay | Admitting: Urology

## 2013-05-26 ENCOUNTER — Ambulatory Visit: Payer: Self-pay | Admitting: Urology

## 2013-05-26 DIAGNOSIS — I519 Heart disease, unspecified: Secondary | ICD-10-CM

## 2013-05-30 ENCOUNTER — Ambulatory Visit: Payer: Self-pay | Admitting: Urology

## 2013-05-30 LAB — BASIC METABOLIC PANEL
Anion Gap: 2 — ABNORMAL LOW (ref 7–16)
Calcium, Total: 8.5 mg/dL (ref 8.5–10.1)
Chloride: 110 mmol/L — ABNORMAL HIGH (ref 98–107)
Creatinine: 0.96 mg/dL (ref 0.60–1.30)
EGFR (Non-African Amer.): >60
Potassium: 4.9 mmol/L (ref 3.5–5.1)

## 2013-07-29 IMAGING — US ULTRASOUND AORTA
1 series · 17 of 25 positions shown · non-contrast
Comparison: none

REASON FOR EXAM: evaluation of AAA size
COMMENTS:

[Series 1: ultrasound aorta · 17 of 32 slices shown]
[im 1/32]
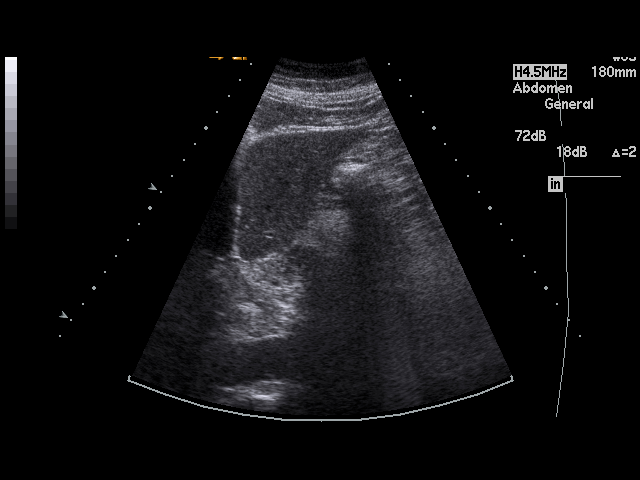
[im 3/32]
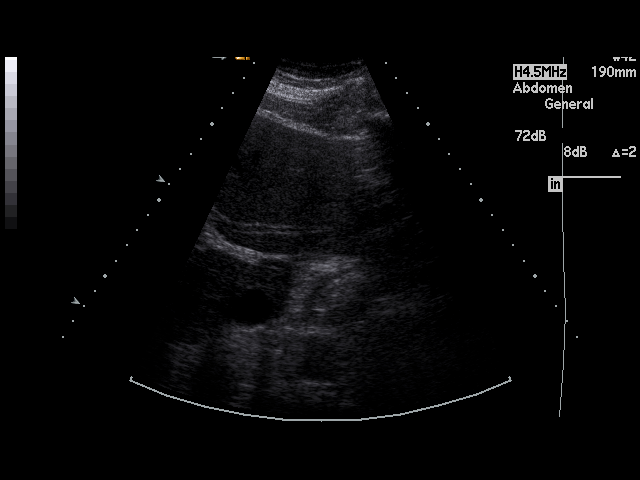
[im 4/32]
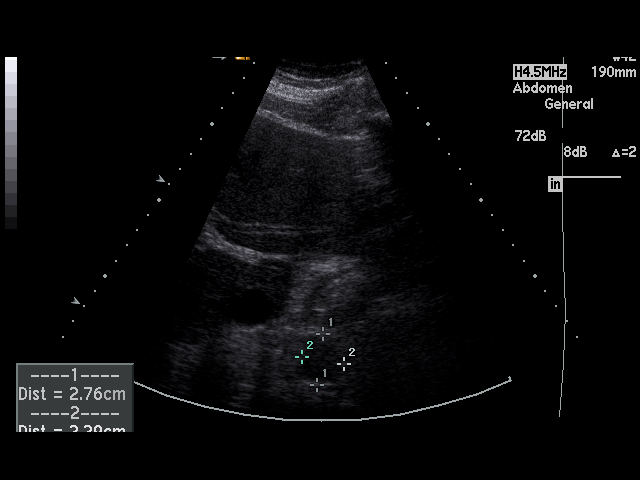
[im 7/32]
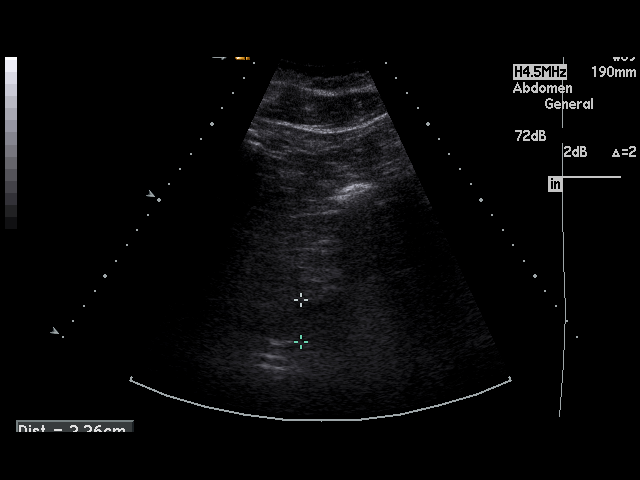
[im 8/32]
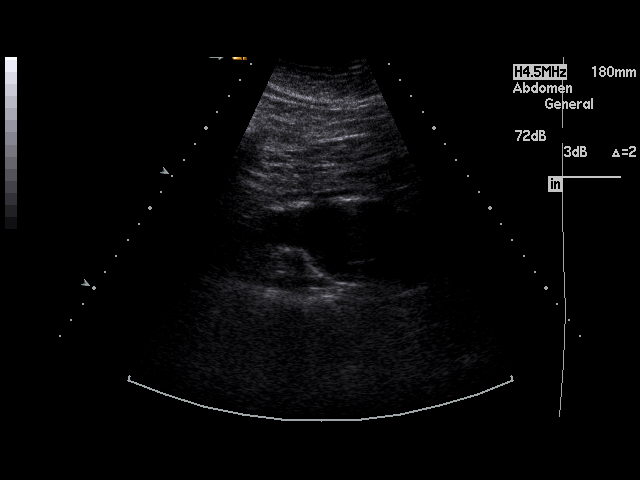
[im 11/32]
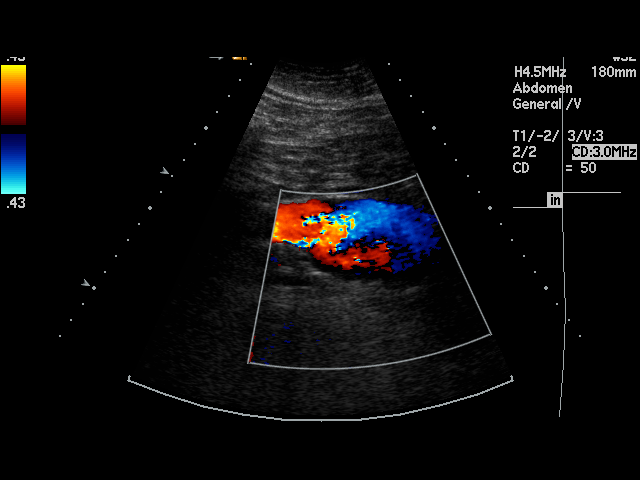
[im 12/32]
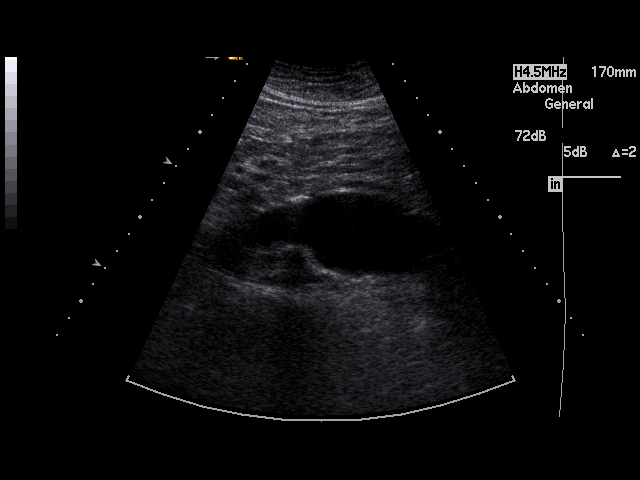
[im 15/32]
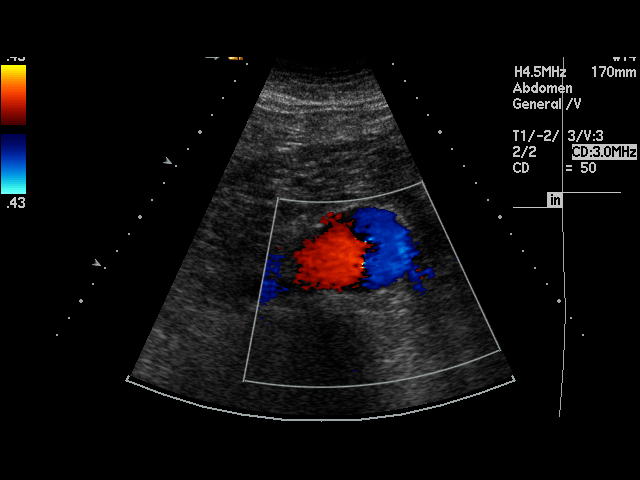
[im 16/32]
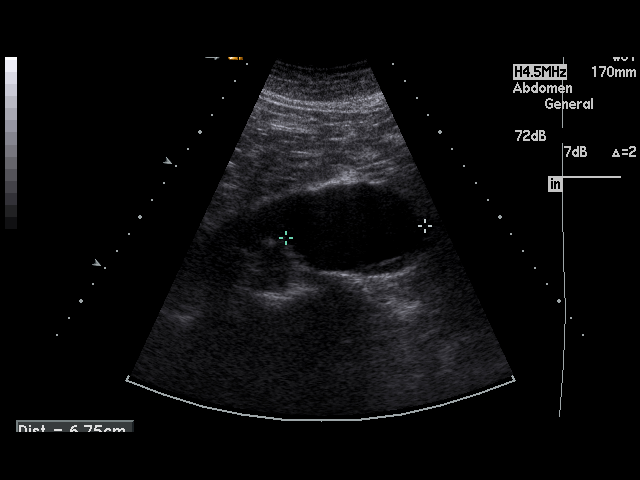
[im 17/32]
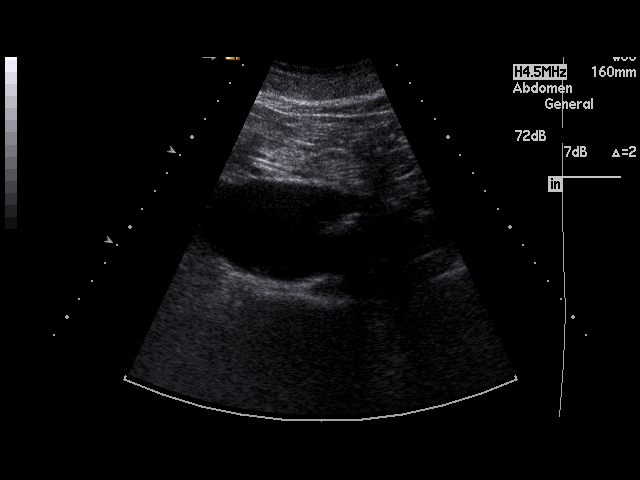
[im 20/32]
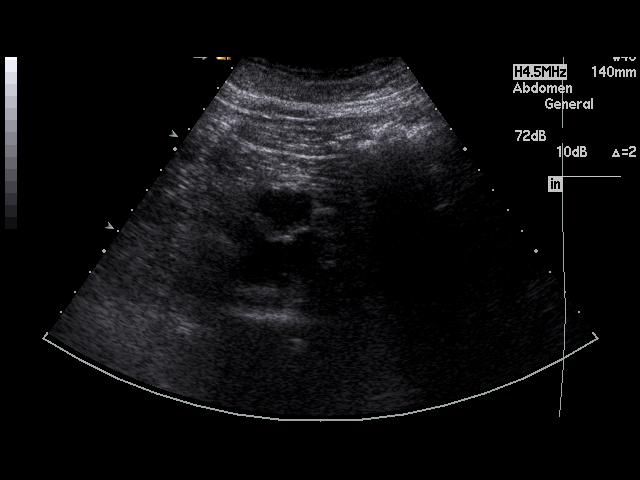
[im 21/32]
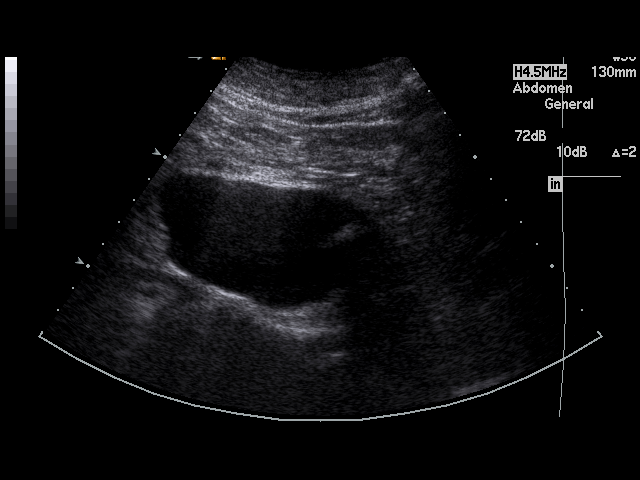
[im 24/32]
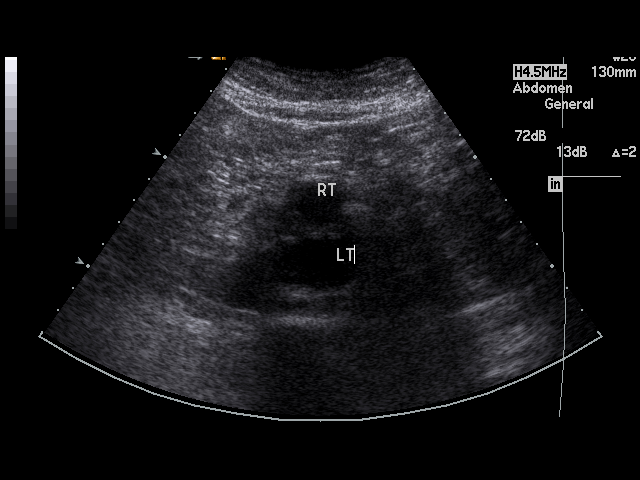
[im 25/32]
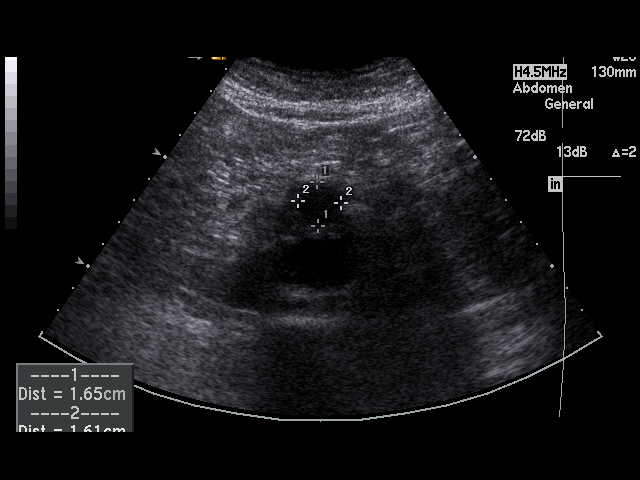
[im 28/32]
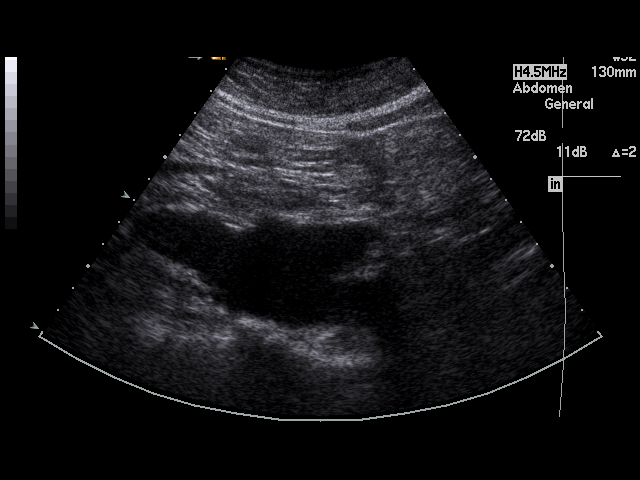
[im 29/32]
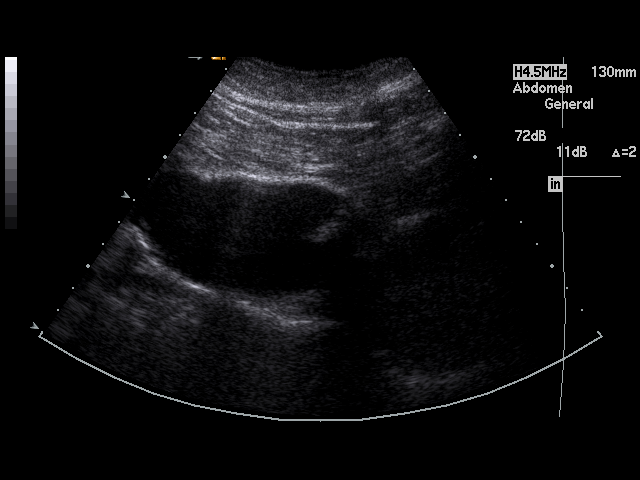
[im 32/32]
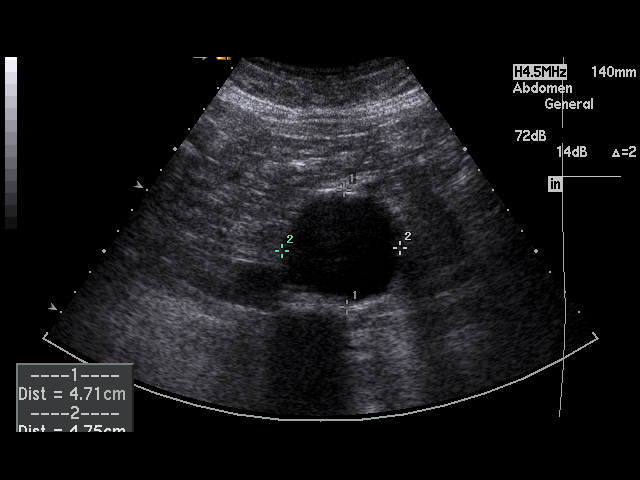

[17 of 25 positions shown; findings below may reference images not displayed]

PROCEDURE:     US  - US AORTA  - October 20, 2011  [DATE]

RESULT:     The patient's known distal abdominal aorta aneurysm is again
observed. On this exam, the aneurysm measures 4.71 cm at maximum AP diameter
and 4.75 cm transversely. The AP diameter has increased by approximately 1
mm since the exam July 04, 2011. The minimal difference may be due to
a difference in measurement technique although an actual 1 mm increase
cannot be excluded. Ectasia of the common iliac arteries is again noted.
Portions of the mid abdominal aorta are obscured by bowel gas.
IMPRESSION: 1. The patient's known distal abdominal aorta aneurysm measures 4.71 cm at
maximum AP diameter and 4.75 cm transversely.
2. There is ectasia of the common iliac arteries.

## 2013-08-03 IMAGING — CR DG CHEST 1V PORT
1 series · 1 of 1 positions shown · non-contrast
Comparison: Chest x-ray 10/24/2011.

CLINICAL DATA: Status post CABG.  Postoperative film.

PORTABLE CHEST - 1 VIEW

[AP]
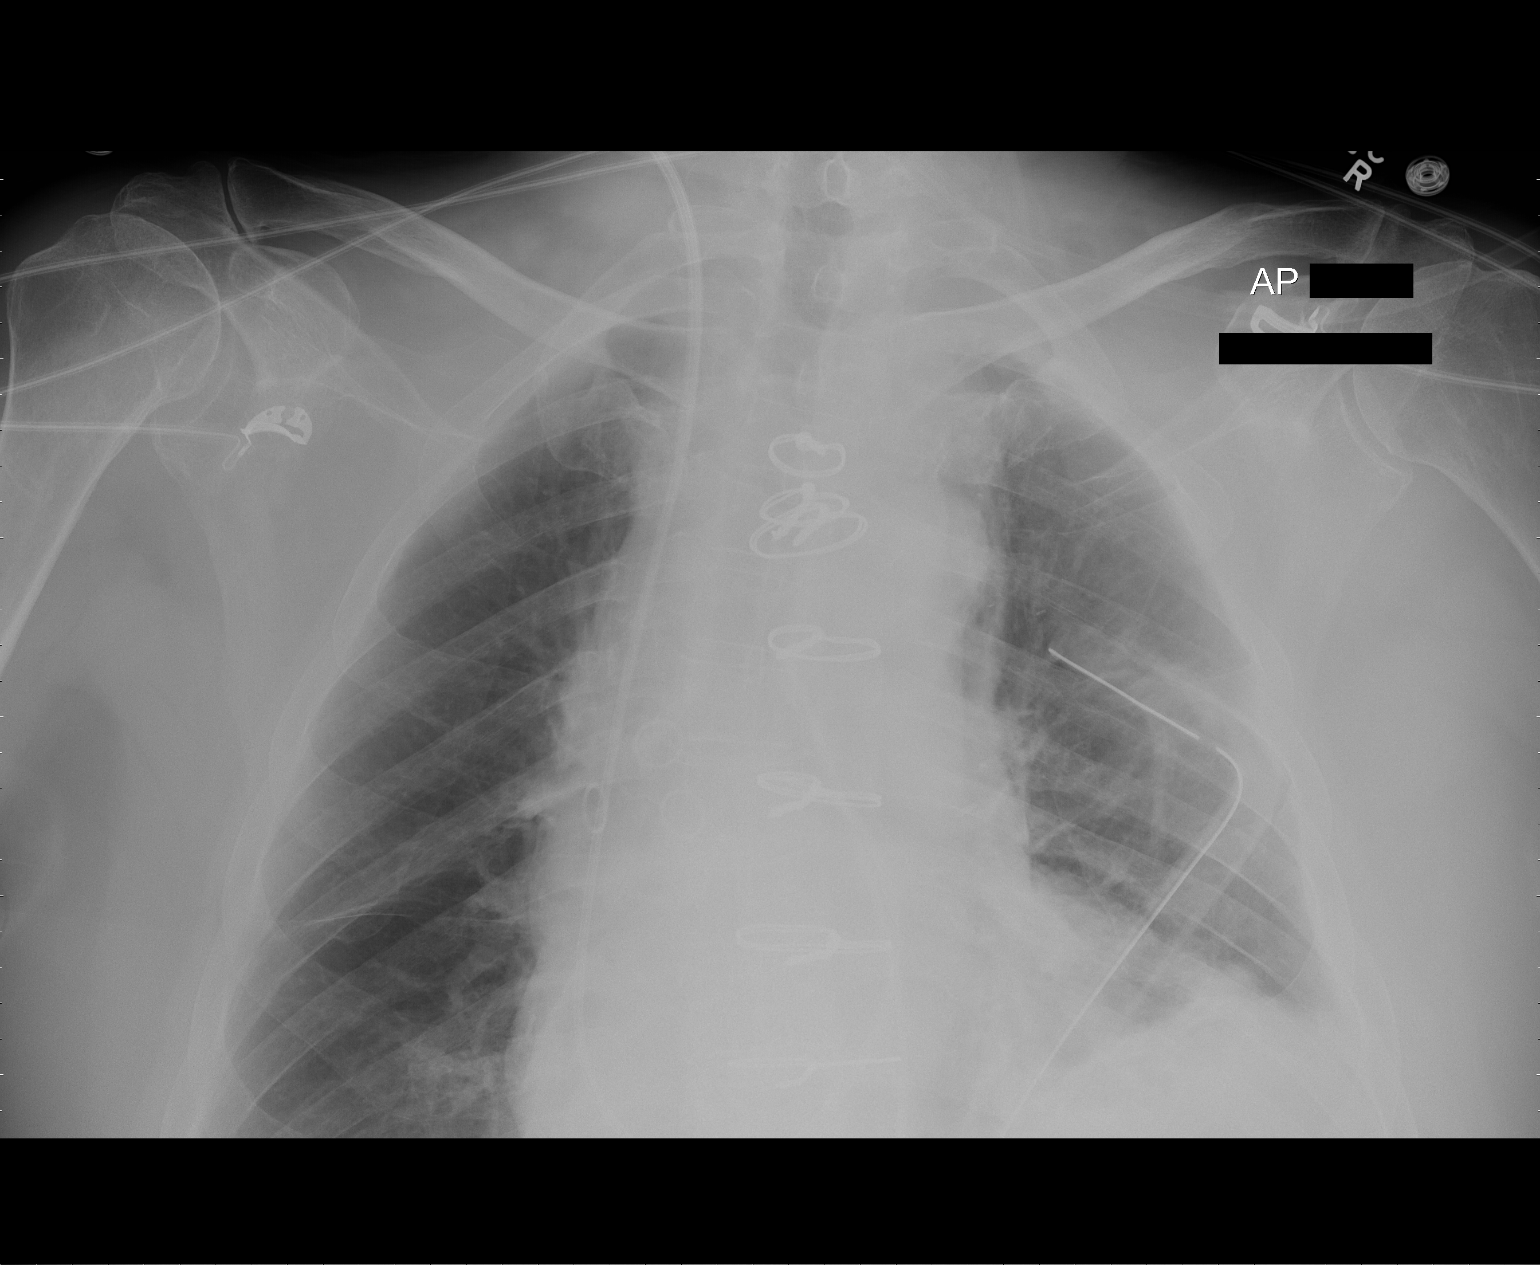

[1 of 1 positions shown; findings below may reference images not displayed]

FINDINGS: Previously noted endotracheal and nasogastric tubes have
been removed.  A left-sided thoracostomy tube is in place with tip
and sideport projecting over the left hemithorax.  A right internal
jugular cordis is in position, through which a Swan Ganz catheter
has been passed into the proximal right pulmonary artery.  A
mediastinal drain is in position.

Lung volumes remain slightly low, and there is persistent mild
elevation of the left hemidiaphragm. Resolving postoperative
atelectasis in the left lower lobe is again noted.  Small amount of
left pleural effusion is similar to the prior examination.  No
definite pneumothorax is identified.  Right lung appears relatively
clear.  Borderline enlargement of the cardiopericardial silhouette
is similar to the prior. The patient is rotated to the left on
today's exam, resulting in distortion of the mediastinal contours
and reduced diagnostic sensitivity and specificity for mediastinal
pathology. The patient is status post median sternotomyfor CABG.
IMPRESSION: 1.  Support apparatus and postoperative changes, as above.
2.  Compared to the prior examination there is decreasing pulmonary
venous congestion, but the appearance of chest is otherwise
unchanged, as above.

## 2013-10-01 ENCOUNTER — Emergency Department: Payer: Self-pay | Admitting: Emergency Medicine

## 2013-10-01 LAB — COMPREHENSIVE METABOLIC PANEL
ALBUMIN: 3.5 g/dL (ref 3.4–5.0)
ALK PHOS: 89 U/L
AST: 39 U/L — AB (ref 15–37)
Anion Gap: 8 (ref 7–16)
BILIRUBIN TOTAL: 0.5 mg/dL (ref 0.2–1.0)
BUN: 21 mg/dL — AB (ref 7–18)
CALCIUM: 8.7 mg/dL (ref 8.5–10.1)
CHLORIDE: 110 mmol/L — AB (ref 98–107)
CREATININE: 0.84 mg/dL (ref 0.60–1.30)
Co2: 23 mmol/L (ref 21–32)
EGFR (African American): >60
Glucose: 111 mg/dL — ABNORMAL HIGH (ref 65–99)
Osmolality: 285 (ref 275–301)
POTASSIUM: 4 mmol/L (ref 3.5–5.1)
SGPT (ALT): 32 U/L (ref 12–78)
Sodium: 141 mmol/L (ref 136–145)
Total Protein: 6.6 g/dL (ref 6.4–8.2)

## 2013-10-01 LAB — CBC
HCT: 40.5 % (ref 40.0–52.0)
HGB: 13.5 g/dL (ref 13.0–18.0)
MCH: 34.8 pg — ABNORMAL HIGH (ref 26.0–34.0)
MCHC: 33.4 g/dL (ref 32.0–36.0)
MCV: 105 fL — AB (ref 80–100)
Platelet: 113 10*3/uL — ABNORMAL LOW (ref 150–440)
RBC: 3.88 10*6/uL — AB (ref 4.40–5.90)
RDW: 15 % — ABNORMAL HIGH (ref 11.5–14.5)
WBC: 8 10*3/uL (ref 3.8–10.6)

## 2013-10-01 LAB — CK: CK, Total: 80 U/L

## 2014-09-26 NOTE — Op Note (Signed)
PATIENT NAME:  Nathaniel Woods, Nathaniel Woods MR#:  [id number] DATE OF BIRTH:  [birth year]  DATE OF PROCEDURE:  05/26/2012  NOTE: This is a co-surgeon note, along with Dr. Delana Meyer, who will be dictating his portion of the procedure.   PREOPERATIVE DIAGNOSES: 1. Abdominal aortic aneurysm.  2. Heart disease.  3. Diabetes mellitus.   POSTOPERATIVE DIAGNOSES:  1. Abdominal aortic aneurysm. 2. Heart disease. 3. Diabetes mellitus.  PROCEDURES: 1. Ultrasound guidance for vascular access to bilateral femoral arteries.  2. Catheter placement in aorta from bilateral femoral approaches, right by Dr. Delana Meyer, left by Dr. Lucky Cowboy.  3. Perclose Closure Device x2 to both femoral arteries, right by Dr. Delana Meyer, left by Dr. Lucky Cowboy.  4. Placement of a Gore Excluder endoprosthesis, co-surgeons, 23 mm diameter main body, 14 mm diameter right ipsilateral limb, 16 mm diameter left contralateral limb.   CO- SURGEONS: Katha Cabal, MD and Leotis Pain, MD    ANESTHESIA: MAC.   ESTIMATED BLOOD LOSS: Approximately 50 mL.   FLUOROSCOPY:  20 minutes.   CONTRAST USED: 90 mL.   INDICATION FOR PROCEDURE: This is an 81 year old white male with an abdominal aortic aneurysm greater than 5 cm in maximal diameter. He has acceptable anatomy for endovascular repair, and he is brought in for repair of his aneurysm in endovascular fashion today. The risks and benefits were discussed. Informed consent was obtained.   DESCRIPTION OF PROCEDURE: The patient was brought to the vascular interventional radiology suite with the help of our Anesthesia colleagues providing a monitored anesthesia care sedation. The groins were locally anesthetized, and access was obtained to the femoral arteries under direct ultrasound guidance and permanent image was recorded. Then 6-French sheathes were placed. We then placed the 2 Perclose ProGlide Closure Devices  in a preclosed fashion to be used for closure at the end, and 8-French  sheaths were placed  bilaterally. Dr. Nino Parsley side was going to be the right side. Dr. Delana Meyer worked on the right side, and this was going to be the ipsilateral side; and after a pigtail catheter was placed for AP aortogram from the right, we upsized to an 18-French  sheath over an Amplatz Super Stiff Wire. The device was deployed just below the level of the renal arteries with the left being lower. I then cannulated the contralateral gate with a mild amount of difficulty. I eventually used a C2 catheter and a Stiff Angled Glidewire and confirmed successful cannulation by twirling the pigtail catheter in the main body. The pigtail catheter was used to shoot, and we reconstrained the device and advanced this approximately 1 cm to place this just below the left renal artery, which was lower. I placed a 16 mm diameter x 14 cm length contralateral limb on the left and brought this down to just above the hypogastric artery. Dr. Delana Meyer completed the ipsilateral deployment which terminated just about a centimeter above the hypogastric artery on the right. All junction points and seal zones were ironed out with the Compliant Balloon, and the pigtail catheter was replaced. Completion angiogram was then performed. This demonstrated patent renal arteries, patent hypogastric arteries with excellent flow through the stent graft, and no significant type 1, 2 or 3 endoleak was seen. The femoral arterial access sites were then closed using the typical ProGlide Closure Device. Two were deployed on each side with successful hemostasis achieved. A pressure dressing was placed. The patient tolerated the procedure well and was taken the recovery room in stable condition.  ____________________________ Algernon Huxley, MD  jsd:cb D: 05/26/2012 09:58:49 ET T: 05/26/2012 12:54:02 ET JOB#: [id number]  cc: Algernon Huxley, MD, <Dictator> Vianne Bulls. Arline Asp, MD Algernon Huxley MD ELECTRONICALLY SIGNED 05/28/2012 18:37

## 2014-09-29 NOTE — H&P (Signed)
PATIENT NAME:  Nathaniel Woods, WISEMAN MR#:  [id number] DATE OF BIRTH:  1929/02/11  DATE OF ADMISSION:  05/30/2013  CHIEF COMPLAINT: Hematuria due to urinary stones.   HISTORY OF PRESENT ILLNESS: Mr. Bellis is an 81 year old Caucasian male who presented to the office in November with gross hematuria. Evaluation included cystoscopy and CT scan. CT scan indicated that he had a 10 x 8 mm stone in the distal left ureter and a 3 mm stone in the right mid ureter. He comes in now for left ureteroscopic ureterolithotomy with the holmium laser. He has a significant cardiac history having undergone bypass surgery in May 2013. He was evaluated preoperatively by his cardiologist, who is Dr. Saralyn Pilar and deemed stable for this procedure.   ALLERGIES: IV CONTRAST, DECONGESTANTS AND AMBIEN.   CURRENT MEDICATIONS: Atenolol, metformin, losartan, coenzyme Q, aspirin, lutein, biotin fish oil, multivitamin, finasteride, Crestor and Aleve.   PAST SURGICAL HISTORY: Included:  1.  Repair of fractured hip in 1992.  2.  Ureteroscopic ureterolithotomy in 2012.  3.  Coronary bypass graft x 4 in 2013.  4.  Aortic aneurysm vascular graft in 2013.   PAST AND CURRENT MEDICAL CONDITIONS:  1.  Coronary artery disease.  2.  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  3.  Hypertension.  4.  Vasovagal episodes.  5.  Hyperlipidemia.  6.  Aortic aneurysm status post vascular graft.  7.  GERD. 8.  Colon polyps.  9.  History of subdural hematoma due to a fall in 2010 without permanent sequelae. 10.  History of kidney stones.   REVIEW OF SYSTEMS: The patient denied chest pain, shortness of breath, diabetes, or stroke.   PHYSICAL EXAMINATION: GENERAL: Well-nourished white male in no acute distress.  HEENT: Sclerae were clear. Pupils were equally round, reactive to light.  NECK: Supple. No palpable cervical adenopathy. No audible carotid bruits.  LUNGS: Clear to auscultation.  CARDIOVASCULAR: Regular rhythm and  rate.  ABDOMEN: Soft, nontender abdomen.  GENITOURINARY: Circumcised. Testes are smooth and nontender.  RECTAL: 40 grams smooth nontender prostate.  NEUROMUSCULAR: Alert and oriented x 3.   IMPRESSION:  1.  A 10 x 8 mm distal left ureteral stone.  2.  A 3 mm right mid ureteral stone.  3.  Hematuria.   PLAN: Left ureteroscopic ureterolithotomy with holmium laser lithotripsy.    ____________________________ Otelia Limes. Yves Dill, MD [email or web address]:dp D: 05/25/2013 15:54:35 ET T: 05/25/2013 16:16:45 ET JOB#: [id number]  cc: Otelia Limes. Yves Dill, MD, <Dictator> Royston Cowper MD ELECTRONICALLY SIGNED 05/25/2013 21:11

## 2014-09-29 NOTE — Op Note (Signed)
PATIENT NAME:  Nathaniel Woods, Nathaniel Woods MR#:  [id number] DATE OF BIRTH:  Mar 04, 1929  DATE OF PROCEDURE:  05/30/2013  PREOPERATIVE DIAGNOSIS: Left ureterolithiasis.   POSTOPERATIVE DIAGNOSIS:  Left ureterolithiasis.    PROCEDURES: 1.  Left ureteroscopic ureterolithotomy with holmium laser lithotripsy.  2.  Left double pigtail stent placement.  3.  Fluoroscopy.   SURGEON: Maryan Puls, M.D.   ANESTHETISTKayleen Memos.     ANESTHETIC METHOD: General.   INDICATIONS: See the dictated history and physical. After informed consent, the patient requests the above procedures.   OPERATIVE SUMMARY: After adequate general anesthesia had been obtained, the patient was placed into dorsal lithotomy position, and the perineum was prepped and draped in the usual fashion. Fluoroscopy confirmed presence of a 12 x 7 mm distal left ureteral stone. At this point, the 21-French cystoscope was coupled with the camera and then visually advanced into the bladder. The bladder was thoroughly inspected. No bladder tumors were identified. A 0.035 Glidewire was then advanced up to the left ureter under fluoroscopic guidance. A 7 mm balloon dilating catheter was advanced over the guidewire and positioned into the distal ureter. The ureter was dilated to 7 mm. The balloon was deflated, and balloon catheter removed, taking care to leave the guidewire in position. The cystoscope also was removed, taking care to leave the guidewire in position. The mini Storz rigid ureteroscope was then coupled to the camera and then visually advanced into the left orifice. Stone was identified. The 550 micron holmium laser fiber was introduced through the scope, and the stone was fully fragmented. Several fragments were evacuated out of the ureter. At this point, the ureteroscope was removed and the cystoscope back loaded over the guidewire. A 6 x 26 cm double pigtail stent was then advanced over the guidewire and positioned in the ureter under fluoroscopic  guidance. The guidewire was then removed, taking care to leave the stent in position. The bladder was drained, and the cystoscope was removed; 10 mL of viscous Xylocaine was instilled within the urethra and the bladder. The procedure was then terminated, and the patient was transferred to the recovery room in stable condition.   ____________________________ Otelia Limes. Yves Dill, MD [id number] D: 05/30/2013 11:51:00 ET T: 05/30/2013 12:08:48 ET JOB#: [id number]  cc: Otelia Limes. Yves Dill, MD, <Dictator> Royston Cowper MD ELECTRONICALLY SIGNED 05/31/2013 8:40

## 2014-09-29 NOTE — Op Note (Signed)
PATIENT NAME:  Nathaniel Woods, Nathaniel Woods MR#:  [id number] DATE OF BIRTH:  03/09/1929  DATE OF PROCEDURE:  05/26/2012  PREOPERATIVE DIAGNOSIS: Abdominal aortic aneurysm.   POSTOPERATIVE DIAGNOSIS: Abdominal aortic aneurysm.  PROCEDURES PERFORMED:  1.  Introduction of catheter into aorta percutaneously, right groin approach.  2.  Introduction catheter percutaneously into aorta, left groin approach.  3.  Placement of  Gore excluder endograft for endovascular treatment of abdominal aortic aneurysm, main body percutaneously right groin.  4.  Placement of contralateral iliac extender limb Gore excluder endoprosthesis, left groin percutaneously.  5.  Ultrasound-guided access to common femoral arteries, both right and left. 6.  Closure of arteriotomies right groin and left groin with the pro-glide device in a  pre-close fashion.   SURGEON: Dr. Delana Meyer.  CO-SURGEON: Dr. Lucky Cowboy.   ANESTHESIA: MAC.   ESTIMATED BLOOD LOSS: Minimal.   SPECIMEN: None.   FLUOROSCOPY TIME: 20.9 minutes.   CONTRAST USED: Isovue 90 mL.   INDICATIONS: The patient is an 81 year old gentleman with abdominal aortic aneurysm approximately 6 cm in diameter, who presents now for endovascular repair. The risks and benefits were reviewed. All questions are answered. The patient agrees to proceed.   DESCRIPTION OF PROCEDURE: The patient is taken to special procedures and placed in the supine position. After adequate sedation is achieved, he is prepped from above the umbilicus down to the knees and both groins bilaterally.   Ultrasound is then placed in a sterile sleeve. Dr. Lucky Cowboy working on the left, myself working on the right; the common femoral artery is identified bilaterally using the ultrasound. It is echolucent and pulsatile indicating patency. Image is recorded for both the right and left groins and puncture is made with a micropuncture needle into the anterior wall under direct ultrasound visualization. Microwire followed by  microsheath is then inserted and subsequently a 6-French sheath is inserted over a J-wire. Again, working simultaneously a total of 4 pro-glide devices are utilized performing the pre-close technique. Pro-glide is inserted and exchanged for the sheath. Wire is removed. Pro-glide is turned to approximately an 11 o'clock position and the device is then deployed. The knot is retrieved, but not tied down and secured with a hemostat. A second device is then inserted at the 2 o'clock position in similar fashion deployed, but the knot is not secured. The wire is then reintroduced and an 8-French sheath is inserted. This is performed by myself on the right side and Dr. Lucky Cowboy on the left.   Through the 8-French sheath, catheters working with a Kumpe catheter and ultimately in exchange for a marker pigtail on the right and a Kumpe on the left, the wires and catheters are negotiated into the infrarenal aorta proximal to the aneurysm. AP projection of the aneurysm is then obtained with a bolus injection of contrast from the right and after final measurements are made, the 23 x 15 x 16 main body is opened onto the field. The 8-French sheath is exchanged over an Amplatz super stiff wire for an 18-French dry seal sheath and subsequently the main body is advanced through the sheath and partially deployed just below the level of the left renal, which is the lower renal. Working with the Kumpe catheter from the left, ultimately the device had to be re-constrained, torqued and then the gate was engaged and the catheter advanced. It was then redeployed after a followup angiogram was performed to demonstrate the exact location of the left renal artery. With the marker pigtail on the left side, hand  injection of contrast was utilized and a 16 x 14 cm contralateral limb was opened onto the field. The 8-French sheath was upsized to a 12-French dry seal sheath and the contralateral limb was advanced over an Amplatz super stiff wire and  subsequently deployed without difficulty. A Coda balloon was then utilized to seal the proximal as well as the gate and both distal landing zones.   The pigtail catheter was then reintroduced up the left side and bolus injection of contrast was performed with delayed images. A very late a small type II endoleak was noted. There was no type I/type III endoleaks and the stent graft appears to be in excellent position and orientation.   The pro-glide knots were then secured down after removal of the sheath. Left groin was done first and then the right groin. At the conclusion, there was hemostasis in both groins and safeguard devices were applied.   The patient tolerated the procedure well. There were no immediate complications. Sponge and needle counts were correct and he was taken to the recovery area in stable condition.  ____________________________ Katha Cabal, MD ggs:aw D: 05/26/2012 16:30:48 ET T: 05/27/2012 08:48:57 ET JOB#: [id number]  cc: Katha Cabal, MD, <Dictator> Vianne Bulls. Arline Asp, MD  Katha Cabal MD ELECTRONICALLY SIGNED 06/15/2012 11:17

## 2014-10-01 NOTE — Consult Note (Signed)
Brief Consult Note: Diagnosis: NSTEMI.   Patient was seen by consultant.   Consult note dictated.   Comments: REC  Agree with current therapy, cardiac cath later tody.  Electronic Signatures: Isaias Cowman (MD)  (Signed 13-May-13 10:18)  Authored: Brief Consult Note   Last Updated: 13-May-13 10:18 by Isaias Cowman (MD)

## 2014-10-01 NOTE — Discharge Summary (Signed)
PATIENT NAME:  Nathaniel Woods, MCEWAN MR#:  [id number] DATE OF BIRTH:  08-11-1928  DATE OF ADMISSION:  10/20/2011 DATE OF DISCHARGE:  10/21/2011  ADMITTING PHYSICIAN: Phillips Climes, MD  TRANSFERRING PHYSICIAN: Gladstone Lighter, MD  PRIMARY CARE PHYSICIAN: Apolonio Schneiders, MD  PRIMARY CARDIOLOGIST: Isaias Cowman, MD  CONSULTANTS:  1. Isaias Cowman, MD - Cardiology. 2. Leotis Pain, MD - Vascular.  DISCHARGE DIAGNOSES:  1. Non-ST-segment elevation myocardial infarction status post cardiac catheterization showing three vessel disease. Please see full catheterization report for more details.  2. Coronary artery disease status post stents.  3. Hypertension.  4. Hyperlipidemia.  5. Abdominal aortic aneurysm at 4.7 cm currently. Will need outpatient vascular follow-up for possible repair in the future.  6. Constipation.  7. Gastroesophageal reflux disease.  8. History of subdural hematoma in 2010 after a fall. 9. Acute delirium secondary to Ambien which is resolved at this time.   DISCHARGE MEDICATIONS:  1. Coenzyme Q-10 50 mg one capsule p.o. daily.  2. Multivitamin 1 tablet p.o. daily.  3. Lisinopril 20 mg p.o. daily.  4. Lutein 12 mg p.o. daily.  5. Atenolol 50 mg p.o. daily.  6. Fish oil 1200 mg p.o. daily.  7. Finasteride 5 mg p.o. daily.  8. Biotin 5,000 mcg p.o. daily.  9. Crestor 10 mg p.o. at bedtime.  10. Aspirin 325 mg p.o. daily.  11. Heparin drip.  12. 2% nitroglycerin ointment 1 inch topical twice a day.   OXYGEN: During transportation 2 liters.   DIET: Low sodium.   FOLLOWUP INSTRUCTIONS: The patient will be transferred to [facility] or [place] whenever a bed is available for considering for bypass graft surgery.   LABS AND IMAGING STUDIES: From 10/21/2011: WBC 8.4, hemoglobin 15.1, hematocrit 43.8, and platelet count 107.   PTT is greater than 160 while on heparin drip.   Sodium 142, potassium 4.1, chloride 111, bicarbonate 26, BUN 17, creatinine 0.8,  glucose 131, and calcium 8.8. First set of troponin on admission was 3.2 with CK-MB of 26.7. Repeat troponin was 10.45 with CK-MB of 49.9 and CK of 445.   Abdominal aortic ultrasound is showing distal abdominal aortic aneurysm measuring 4.71 cm at maximum AP diameter and 4.75 cm transversely. There is ectasia of both common iliac arteries.   Cardiac catheterization is showing severe three-vessel coronary artery disease with 90% in stent restenosis of mid LAD, 75% stenosis of large caliber of obtuse marginal I and occluded mid RCA which is the probable culprit vessel with moderate collaterals to PDA. Ejection fraction is calculated to be 52%.   BRIEF HOSPITAL COURSE: Nathaniel Woods is an 81 year old Caucasian male with past medical history significant for coronary artery disease status post up to six stents placed in the past, hypertension, hyperlipidemia, and prior history of a fall and subdural hematoma in 2010 managed at [facility] who has been taken off of aspirin and Plavix secondary to his subdural hematoma who presented to [facility] emergency room for chest pain. There were no EKG changes and first set of troponin was negative and per the patient's request the patient was transferred to [facility] because his cardiologist, Dr. Saralyn Pilar, works here.   1. NSTEMI: First set of troponin was elevated here at [facility] and repeat sets kept elevating with ongoing chest pain. The patient was started on nitro drip and also heparin and had a cardiac catheterization on 10/20/2011. The catheterization report shows three-vessel disease with RCA blockage of obtuse marginal and LAD disease.  The patient will need bypass graft surgery. Dr. Saralyn Pilar already spoke with the cardiothoracic surgeon at [facility] and the patient is currently awaiting a bed. His chest pain is improved so nitroglycerin nitro drip has been stopped and he is placed on nitro paste and continues on heparin drip at this  point. He is already on aspirin, atenolol and also lisinopril at this point.  2. Known history of triple-A: It has been increasing in size slowly over the last year, currently at 4.7 cm. He follows up with Dr. Hortencia Pilar, vascular surgeon here as an outpatient. They have seen the patient in the hospital and recommended outpatient follow for further repair or stent in the near future.  3. Hypertension: He is on atenolol, lisinopril, and nitro patch at this time.  4. Acute delirium: It happened last night after a dose of Ambien for sleep, and the patient was very agitated, but currently this morning the delirium is mostly resolved and he has a clear mental status. 5. History of subdural hematoma after a fall: No intracerebral hemorrhage. According to the patient, he was followed at [facility] more than three years ago at the time. Currently he is asymptomatic and will need aspirin and heparin with his acute myocardial infarction. So continue to monitor.  6. Constipation: He is getting milk of magnesium in the hospital.  7. Gastroesophageal reflux disease: He is on Protonix.   CODE STATUS: FULL CODE.   DISCHARGE CONDITION: Guarded.   DISCHARGE DISPOSITION: [facility] or [facility] for consideration for coronary artery bypass graft surgery.   ADDITIONAL TIME SPENT: 40 minutes.  ____________________________ Gladstone Lighter, MD rk:slb D: 10/21/2011 15:20:43 ET    T: 10/21/2011 15:40:12 ET        JOB#: [id number] cc: Gladstone Lighter, MD, <Dictator> Vianne Bulls. Arline Asp, MD Gladstone Lighter MD ELECTRONICALLY SIGNED 10/22/2011 14:21

## 2014-10-01 NOTE — H&P (Signed)
PATIENT NAME:  DAHL, Nathaniel Woods MR#:  [id number] DATE OF BIRTH:  18-Jul-1928  DATE OF ADMISSION:  10/20/2011  PRIMARY CARE PHYSICIAN: Dr. Arline Asp  REFERRING PHYSICIAN: [facility]  PRIMARY CARDIOLOGIST:  Dr. Saralyn Pilar  CHIEF COMPLAINT: Chest pain.   HISTORY OF PRESENT ILLNESS: [name] is an 80 year old male with significant past medical history of coronary artery disease status post anterior MI, anterior/inferior in 1995 status post stent in mid LAD and AL2 and status post Cypher stent distal RCA in 2006, history of hyperlipidemia, diabetes, hypertension, and subdural hematoma status post fall who presents to ED with chest pain. Patient reports she woke up at 9:30 p.m. with chest tightness with mild shortness of breath. Patient reports he has been feeling occasional chest pain when he used to exercise on the treadmill. He describes this chest pain more as tightness, it was continuous, but much improved until upon his presentation to ED with mild shortness of breath. Denies any diaphoresis, any nausea any palpitation, any altered mental status, any lightheadedness or dizziness. Upon presentation to [facility] STEMI code was called as there was suspicion patient had ST elevated MI but once EKG reviewed by ED [facility] physician STEMI code was cancelled and actually does not appear to be any ST elevation and given the fact patient's primary cardiologist is at [facility], Dr. Saralyn Pilar. ED physician spoke to Dr. Nehemiah Massed covering for [facility] who accepted patient to be transferred to [facility]. Upon presentation to [facility] patient's EKG was compared to old one and only changes noticed is just very mild ST depression in V2 lead. Patient at this point has no chest pain and has negative troponin and given the fact patient had recent history of subdural hematoma before two years and has history of AAA which he says likely will be operated upon in a couple of months he heparin drip was  stopped.   PAST MEDICAL HISTORY:  1. Coronary artery disease status post anterior myocardial infarction in 10/1993, angioplasty in 10/1993, subsequently had inferior myocardial infarction in 75/1025 complicated by ventricular fibrillation and cardiac arrest. Underwent repeat angioplasty of RCA lesion with J and J stent in 12/1993 at [facility]. Also had a Cooke stent placed in mid LAD and second diagonal. Repeat catheterization at [facility] 04/1995 showed no changes. Repeat stent RCA in December 2006.  2. Degenerative joint disease and history of aseptic necrosis.  3. Hypertension.  4. History of vasovagal syncope.  5. Hyperlipidemia.  6. Small abdominal aortic aneurysm measuring 4.6 by ultrasound in January 2013, followed by Dr. Delana Meyer, vascular surgery.  7. History of reflux disease.  8. New onset headache in July 1999.  9. History of colonic polyps.  10. Diverticulosis.  11. History of subdural hematoma due to fall in April 2010, managed conservatively.   MEDICATIONS:  1. Atenolol 50 mg daily.  2. Crestor 5 mg at bedtime.  3. Indomethacin 25 mg as needed.  4. Lisinopril 20 mg daily.  5. Metformin 500 mg daily.  6. Multivitamin 1 capsule daily.  7. Co-Q10 1 tablet daily.  8. Fish oil 1000 mg 2 capsules daily.  9. Finasteride 5 mg daily.   ALLERGIES: Zocor.   FAMILY HISTORY: Negative for coronary artery disease.   SOCIAL HISTORY: Stopped smoking in 1985. Drinks occasional alcohol.   REVIEW OF SYSTEMS: CONSTITUTIONAL: Denies any fever, fatigue, weakness. Denies any blurry vision, double vision, redness. ENT: Denies any tinnitus, ear pain, hearing loss. RESPIRATORY: Denies any cough, wheezing, hemoptysis, dyspnea. CARDIOVASCULAR:  Denies any orthopnea, edema, arrhythmia. Complains of chest pain. GASTROINTESTINAL: Denies any nausea, vomiting, diarrhea, abdominal pain. GENITOURINARY: Denies any dysuria, hematuria, renal colic. Denies any sores, prostatitis. Has benign prostatic hypertrophy.  ENDOCRINE: Denies any polyuria, dysuria, polydipsia, heat or cold intolerance. HEMATOLOGY: Denies any easy bruising, bleeding diathesis. Has history of subdural hematoma. NEUROLOGICAL: Denies any numbness, dysarthria, epilepsy. PSYCH: Denies anxiety, insomnia, alcohol or drug abuse.   PHYSICAL EXAMINATION:  VITAL SIGNS: Temperature 98.7, pulse 58, respiratory rate 22, blood pressure 147/76.   GENERAL: Elderly male, comfortable in bed, in no apparent distress.   HEENT: Head atraumatic, normocephalic. Pupils equal, reactive to light. Pink conjunctivae. Anicteric sclerae. Moist oral mucosa.   NECK: Supple. No thyromegaly. No JVD.   CHEST: Good air entry bilaterally. No wheezing, rales, rhonchi.   CARDIOVASCULAR: S1, S2 heard. No rubs, murmur, gallops.   ABDOMEN: Soft, nontender, nondistended. Bowel sounds present.   EXTREMITIES: No edema, no clubbing, no cyanosis.   PSYCHIATRIC: Appropriate affect. Awake, alert x3. Intact judgment and insight.   LABORATORY, DIAGNOSTIC AND RADIOLOGICAL DATA: These labs were done at [facility]. INR 0.91, white blood cells 6, hemoglobin 15, hematocrit 42.6, platelets 111, sodium 139, potassium 4, chloride 105, CO2 23, glucose 187, BUN 21, creatinine 0.93. Troponin less than 0.3. Calcium 9.7, total protein 6.7, AST 21, ALT 13, alkaline phosphatase 74.   ASSESSMENT AND PLAN: This is an 81 year old male with history of coronary artery disease presents with:  1. Chest pain. Given his history of coronary artery disease patient will be admitted. Will cycle cardiac enzymes. Will be admitted to tele bed. Will continue him on nitroglycerin drip. Patient already given aspirin. He is on beta blocker and statin and ACE inhibitor and given the fact that patient history of subdural hematoma and history of AAA and given the fact his cardiac enzymes are negative and currently he is chest pain free will discontinue his heparin drip. Discussed with Dr. Nehemiah Massed who will see patient  in a.m.  2. Diabetes mellitus. Will hold patient's metformin. Will have him on insulin sliding scale.  3. Hypertension. Will continue patient on atenolol and lisinopril.  4. Hyperlipidemia. Will continue patient on fish oil and statin.  5. GI prophylaxis. Will have patient on Protonix.  6. CODE STATUS: Patient is FULL CODE.   TOTAL TIME SPENT ON PATIENT CARE: 50 minutes.   ____________________________ Albertine Patricia, MD dse:cms D: 10/20/2011 04:22:12 ET T: 10/20/2011 07:20:21 ET JOB#: [id number]  cc: Albertine Patricia, MD, <Dictator> Vianne Bulls. Arline Asp, MD DAWOOD Graciela Husbands MD ELECTRONICALLY SIGNED 10/21/2011 1:18

## 2014-10-01 NOTE — Consult Note (Signed)
PATIENT NAME:  Nathaniel Woods, Nathaniel Woods MR#:  [id number] DATE OF BIRTH:  Nov 02, 1928  DATE OF CONSULTATION:  10/20/2011  REFERRING PHYSICIAN:   CONSULTING PHYSICIAN:  Isaias Cowman, MD  PRIMARY CARE PHYSICIAN: Apolonio Schneiders, MD  CHIEF COMPLAINT: Chest pain.   HISTORY OF PRESENT ILLNESS: The patient is an 81 year old gentleman with known coronary artery disease. He reports that he has been in his usual state of health until the past several weeks when he has noted substernal chest fullness with exercise walking on a treadmill. Yesterday evening the patient was watching TV and noted substernal and midepigastric fullness. EMS was called. The patient was brought to [facility] Emergency Room. Per the patient's request, the patient was transferred to [facility]. He has ruled in for non-ST elevation myocardial infarction with a troponin of 3.2. The patient reports he is feeling much better, but still has some residual mild substernal chest discomfort.   PAST MEDICAL HISTORY:  1. Status post anterior myocardial infarction on 11/01/1993.  2. Status post percutaneous transluminal coronary angioplasty of LAD on 11/06/1993.  3. Status post inferior myocardial infarction on 12/11/1993.  4. Status post stent in the right coronary artery on 12/16/1993.  5. Status post stent in mid LAD and second anterolateral branch on 12/17/1993. 6. Status post Cypher stent in distal RCA on 05/12/2005.  7. Abdominal aortic aneurysm.  8. Hyperlipidemia.  9. History of subdural hematoma. 10. Right rotator cuff injury.  11. History of aseptic necrosis.   MEDICATIONS ON ADMISSION:  1. Atenolol 50 mg daily.  2. Crestor 2.5 mg three times daily. 3. Lisinopril 20 mg daily.  4. Indomethacin 25 mg daily p.r.n.  5. Fortamet 500 mg daily.  6. Fish oil capsule two capsules daily.  7. Finasteride 5 mg daily.   SOCIAL HISTORY: The patient is married. He has three sons. He has remote tobacco abuse history.   FAMILY HISTORY: No immediate  family history of coronary artery disease or myocardial infarction.   REVIEW OF SYSTEMS: CONSTITUTIONAL: No fever or chills. EYES: No blurry vision. EARS: No hearing loss. PULMONARY: No shortness of breath. CARDIOVASCULAR: Chest pain as described above. GASTROINTESTINAL: No nausea, vomiting, diarrhea, or constipation. GU: No dysuria or hematuria. ENDOCRINE: No polyuria or polydipsia. MUSCULOSKELETAL: No arthralgias or myalgias. NEUROLOGICAL: No focal muscle weakness or numbness. PSYCHOLOGICAL: No depression or anxiety.   PHYSICAL EXAMINATION:   VITAL SIGNS: Blood pressure 141/76, pulse 54, respirations 20, temperature 98.2, and pulse oximetry 95%.   HEENT: Pupils equal and reactive to light and accommodation.   NECK: Supple without thyromegaly.   LUNGS: Clear.   HEART: Normal jugular venous pressure. Normal point of maximal impulse. Regular rate and rhythm. Normal S1 and S2. No appreciable gallop, murmur, or rub.   ABDOMEN: Soft and nontender. Pulses were intact bilaterally.   MUSCULOSKELETAL: Normal muscle tone.   NEUROLOGIC: The patient was alert and oriented x3. Motor and sensory were both grossly intact.   IMPRESSION: This is an 81 year old gentleman with known coronary artery disease as stated above who presents with chest pain at rest and has ruled in for non-ST-elevation myocardial infarction.   RECOMMENDATIONS:  1. Continue present medications.  2. Proceed with cardiac catheterization with selective coronary arteriography. The risks, benefits, and alternatives were explained to the patient and informed written consent obtained.  ____________________________ Isaias Cowman, MD ap:slb D: 10/20/2011 09:56:15 ET T: 10/20/2011 15:42:59 ET JOB#: [id number]  cc: Isaias Cowman, MD, <Dictator> Isaias Cowman MD ELECTRONICALLY SIGNED 11/20/2011 17:39

## 2015-01-18 ENCOUNTER — Other Ambulatory Visit: Payer: Self-pay | Admitting: Vascular Surgery

## 2015-01-18 DIAGNOSIS — I714 Abdominal aortic aneurysm, without rupture, unspecified: Secondary | ICD-10-CM

## 2015-01-25 ENCOUNTER — Ambulatory Visit
Admission: RE | Admit: 2015-01-25 | Discharge: 2015-01-25 | Disposition: A | Discharge status: Home / Self Care | Payer: PPO | Source: Ambulatory Visit | Attending: Vascular Surgery | Admitting: Vascular Surgery

## 2015-01-25 DIAGNOSIS — I714 Abdominal aortic aneurysm, without rupture, unspecified: Secondary | ICD-10-CM

## 2015-01-26 ENCOUNTER — Ambulatory Visit
Admission: RE | Admit: 2015-01-26 | Discharge: 2015-01-26 | Disposition: A | Discharge status: Home / Self Care | Payer: PPO | Source: Ambulatory Visit | Attending: Vascular Surgery | Admitting: Vascular Surgery

## 2015-01-26 DIAGNOSIS — Z09 Encounter for follow-up examination after completed treatment for conditions other than malignant neoplasm: Secondary | ICD-10-CM | POA: Diagnosis not present

## 2015-01-26 DIAGNOSIS — Z8679 Personal history of other diseases of the circulatory system: Secondary | ICD-10-CM | POA: Insufficient documentation

## 2015-01-26 HISTORY — DX: Unspecified asthma, uncomplicated: J45.909

## 2015-01-26 MED ORDER — IOHEXOL 350 MG/ML SOLN
100.0000 mL | Freq: Once | INTRAVENOUS | Status: AC | PRN
  Administered 2015-01-26: 100 mL via INTRAVENOUS

## 2015-01-26 NOTE — Discharge Instructions (Signed)
° ° °  Outpatient Metformin Instructions (Glucophage, Glucovance, Fortamet, Riomet, Metaglip, Glumetza, Actoplus met  Avandamet, Janumet)   Patient: Nathaniel Woods                                                01/26/2015:    Radiology Exam:     As part of your exam today in the Radiology Department, you were given a radiographic contrast material or x-ray dye.  Because you have had this contrast material and you are taking a Metformin drug (Glucophage, Glucovance, Avandamet, Fortamet, Riomet, Metaglip, Glumetza, Actoplus met, Actoplus Met XR, Prandimet or Janumet), please observe the following instructions:   DO NOT  Take this medication for 48 hours after your exam.  Because you have normal renal function and have no comorbidities, you may restart your medication in 48 hours with no need for a renal function test or consultation with your physician.  You have normal renal function but have some comorbidities.  Comorbidities include liver disease, alcohol overuse, heart failure, myocardial or muscular ischemia, sepsis, or other severe infection.  Therefore you should consult your physician before restarting your medication.  You have impaired renal function.  You should consult your physician before restarting your medication and you are advised to get a renal function test before restarting your medication.  Please discuss this with your physician.   Call your doctor before you start taking this medication again.  Your doctor may want to check your kidney function before you start taking this medication again.  I understand these instructions and have had an opportunity to discuss them with Radiology Department personnel.

## 2015-06-25 DIAGNOSIS — H402230 Chronic angle-closure glaucoma, bilateral, stage unspecified: Secondary | ICD-10-CM | POA: Diagnosis not present

## 2015-07-04 DIAGNOSIS — H401131 Primary open-angle glaucoma, bilateral, mild stage: Secondary | ICD-10-CM | POA: Diagnosis not present

## 2015-08-13 DIAGNOSIS — E119 Type 2 diabetes mellitus without complications: Secondary | ICD-10-CM | POA: Diagnosis not present

## 2015-08-13 DIAGNOSIS — E785 Hyperlipidemia, unspecified: Secondary | ICD-10-CM | POA: Diagnosis not present

## 2015-08-13 DIAGNOSIS — I10 Essential (primary) hypertension: Secondary | ICD-10-CM | POA: Diagnosis not present

## 2015-08-13 DIAGNOSIS — I2510 Atherosclerotic heart disease of native coronary artery without angina pectoris: Secondary | ICD-10-CM | POA: Diagnosis not present

## 2015-08-13 DIAGNOSIS — I6529 Occlusion and stenosis of unspecified carotid artery: Secondary | ICD-10-CM | POA: Diagnosis not present

## 2015-08-13 DIAGNOSIS — I714 Abdominal aortic aneurysm, without rupture: Secondary | ICD-10-CM | POA: Diagnosis not present

## 2015-08-29 DIAGNOSIS — Z79899 Other long term (current) drug therapy: Secondary | ICD-10-CM | POA: Diagnosis not present

## 2015-08-29 DIAGNOSIS — E7800 Pure hypercholesterolemia, unspecified: Secondary | ICD-10-CM | POA: Diagnosis not present

## 2015-08-29 DIAGNOSIS — E119 Type 2 diabetes mellitus without complications: Secondary | ICD-10-CM | POA: Diagnosis not present

## 2015-08-31 DIAGNOSIS — Z0000 Encounter for general adult medical examination without abnormal findings: Secondary | ICD-10-CM | POA: Diagnosis not present

## 2015-08-31 DIAGNOSIS — E7800 Pure hypercholesterolemia, unspecified: Secondary | ICD-10-CM | POA: Diagnosis not present

## 2015-08-31 DIAGNOSIS — E119 Type 2 diabetes mellitus without complications: Secondary | ICD-10-CM | POA: Diagnosis not present

## 2015-08-31 DIAGNOSIS — Z79899 Other long term (current) drug therapy: Secondary | ICD-10-CM | POA: Diagnosis not present

## 2015-08-31 DIAGNOSIS — I10 Essential (primary) hypertension: Secondary | ICD-10-CM | POA: Diagnosis not present

## 2015-09-12 DIAGNOSIS — Z951 Presence of aortocoronary bypass graft: Secondary | ICD-10-CM | POA: Diagnosis not present

## 2015-09-12 DIAGNOSIS — E119 Type 2 diabetes mellitus without complications: Secondary | ICD-10-CM | POA: Diagnosis not present

## 2015-09-12 DIAGNOSIS — E1165 Type 2 diabetes mellitus with hyperglycemia: Secondary | ICD-10-CM | POA: Diagnosis not present

## 2015-09-12 DIAGNOSIS — I714 Abdominal aortic aneurysm, without rupture: Secondary | ICD-10-CM | POA: Diagnosis not present

## 2015-09-12 DIAGNOSIS — R55 Syncope and collapse: Secondary | ICD-10-CM | POA: Diagnosis not present

## 2015-09-12 DIAGNOSIS — I2510 Atherosclerotic heart disease of native coronary artery without angina pectoris: Secondary | ICD-10-CM | POA: Diagnosis not present

## 2015-09-12 DIAGNOSIS — I10 Essential (primary) hypertension: Secondary | ICD-10-CM | POA: Diagnosis not present

## 2015-10-03 DIAGNOSIS — L570 Actinic keratosis: Secondary | ICD-10-CM | POA: Diagnosis not present

## 2015-10-03 DIAGNOSIS — C44329 Squamous cell carcinoma of skin of other parts of face: Secondary | ICD-10-CM | POA: Diagnosis not present

## 2015-10-03 DIAGNOSIS — L821 Other seborrheic keratosis: Secondary | ICD-10-CM | POA: Diagnosis not present

## 2015-10-03 DIAGNOSIS — D485 Neoplasm of uncertain behavior of skin: Secondary | ICD-10-CM | POA: Diagnosis not present

## 2015-10-03 DIAGNOSIS — Z85828 Personal history of other malignant neoplasm of skin: Secondary | ICD-10-CM | POA: Diagnosis not present

## 2015-10-03 DIAGNOSIS — L820 Inflamed seborrheic keratosis: Secondary | ICD-10-CM | POA: Diagnosis not present

## 2015-10-03 DIAGNOSIS — L578 Other skin changes due to chronic exposure to nonionizing radiation: Secondary | ICD-10-CM | POA: Diagnosis not present

## 2015-10-03 DIAGNOSIS — Z1283 Encounter for screening for malignant neoplasm of skin: Secondary | ICD-10-CM | POA: Diagnosis not present

## 2015-12-24 DIAGNOSIS — R3915 Urgency of urination: Secondary | ICD-10-CM | POA: Diagnosis not present

## 2015-12-24 DIAGNOSIS — N481 Balanitis: Secondary | ICD-10-CM | POA: Diagnosis not present

## 2015-12-24 DIAGNOSIS — N401 Enlarged prostate with lower urinary tract symptoms: Secondary | ICD-10-CM | POA: Diagnosis not present

## 2015-12-24 DIAGNOSIS — R350 Frequency of micturition: Secondary | ICD-10-CM | POA: Diagnosis not present

## 2015-12-26 DIAGNOSIS — L570 Actinic keratosis: Secondary | ICD-10-CM | POA: Diagnosis not present

## 2015-12-26 DIAGNOSIS — L820 Inflamed seborrheic keratosis: Secondary | ICD-10-CM | POA: Diagnosis not present

## 2015-12-26 DIAGNOSIS — Z85828 Personal history of other malignant neoplasm of skin: Secondary | ICD-10-CM | POA: Diagnosis not present

## 2015-12-26 DIAGNOSIS — L821 Other seborrheic keratosis: Secondary | ICD-10-CM | POA: Diagnosis not present

## 2016-01-02 DIAGNOSIS — H401131 Primary open-angle glaucoma, bilateral, mild stage: Secondary | ICD-10-CM | POA: Diagnosis not present

## 2016-02-09 DIAGNOSIS — H8110 Benign paroxysmal vertigo, unspecified ear: Secondary | ICD-10-CM | POA: Diagnosis not present

## 2016-02-27 DIAGNOSIS — Z79899 Other long term (current) drug therapy: Secondary | ICD-10-CM | POA: Diagnosis not present

## 2016-02-27 DIAGNOSIS — E7800 Pure hypercholesterolemia, unspecified: Secondary | ICD-10-CM | POA: Diagnosis not present

## 2016-02-27 DIAGNOSIS — E119 Type 2 diabetes mellitus without complications: Secondary | ICD-10-CM | POA: Diagnosis not present

## 2016-03-05 DIAGNOSIS — R350 Frequency of micturition: Secondary | ICD-10-CM | POA: Diagnosis not present

## 2016-03-05 DIAGNOSIS — N481 Balanitis: Secondary | ICD-10-CM | POA: Diagnosis not present

## 2016-03-05 DIAGNOSIS — R351 Nocturia: Secondary | ICD-10-CM | POA: Diagnosis not present

## 2016-03-05 DIAGNOSIS — N401 Enlarged prostate with lower urinary tract symptoms: Secondary | ICD-10-CM | POA: Diagnosis not present

## 2016-03-11 DIAGNOSIS — E119 Type 2 diabetes mellitus without complications: Secondary | ICD-10-CM | POA: Diagnosis not present

## 2016-03-11 DIAGNOSIS — Z79899 Other long term (current) drug therapy: Secondary | ICD-10-CM | POA: Diagnosis not present

## 2016-03-11 DIAGNOSIS — I10 Essential (primary) hypertension: Secondary | ICD-10-CM | POA: Diagnosis not present

## 2016-03-11 DIAGNOSIS — I2510 Atherosclerotic heart disease of native coronary artery without angina pectoris: Secondary | ICD-10-CM | POA: Diagnosis not present

## 2016-03-11 DIAGNOSIS — Z23 Encounter for immunization: Secondary | ICD-10-CM | POA: Diagnosis not present

## 2016-03-19 DIAGNOSIS — I714 Abdominal aortic aneurysm, without rupture: Secondary | ICD-10-CM | POA: Diagnosis not present

## 2016-03-19 DIAGNOSIS — I10 Essential (primary) hypertension: Secondary | ICD-10-CM | POA: Diagnosis not present

## 2016-03-19 DIAGNOSIS — Z951 Presence of aortocoronary bypass graft: Secondary | ICD-10-CM | POA: Diagnosis not present

## 2016-03-19 DIAGNOSIS — E119 Type 2 diabetes mellitus without complications: Secondary | ICD-10-CM | POA: Diagnosis not present

## 2016-03-19 DIAGNOSIS — R55 Syncope and collapse: Secondary | ICD-10-CM | POA: Diagnosis not present

## 2016-03-19 DIAGNOSIS — I2510 Atherosclerotic heart disease of native coronary artery without angina pectoris: Secondary | ICD-10-CM | POA: Diagnosis not present

## 2016-03-20 DIAGNOSIS — E119 Type 2 diabetes mellitus without complications: Secondary | ICD-10-CM | POA: Diagnosis not present

## 2016-03-20 DIAGNOSIS — I2510 Atherosclerotic heart disease of native coronary artery without angina pectoris: Secondary | ICD-10-CM | POA: Diagnosis not present

## 2016-03-20 DIAGNOSIS — Z79899 Other long term (current) drug therapy: Secondary | ICD-10-CM | POA: Diagnosis not present

## 2016-03-20 DIAGNOSIS — Z23 Encounter for immunization: Secondary | ICD-10-CM | POA: Diagnosis not present

## 2016-03-20 DIAGNOSIS — I10 Essential (primary) hypertension: Secondary | ICD-10-CM | POA: Diagnosis not present

## 2016-03-26 DIAGNOSIS — L578 Other skin changes due to chronic exposure to nonionizing radiation: Secondary | ICD-10-CM | POA: Diagnosis not present

## 2016-03-26 DIAGNOSIS — L821 Other seborrheic keratosis: Secondary | ICD-10-CM | POA: Diagnosis not present

## 2016-03-26 DIAGNOSIS — D485 Neoplasm of uncertain behavior of skin: Secondary | ICD-10-CM | POA: Diagnosis not present

## 2016-03-26 DIAGNOSIS — L820 Inflamed seborrheic keratosis: Secondary | ICD-10-CM | POA: Diagnosis not present

## 2016-03-26 DIAGNOSIS — L570 Actinic keratosis: Secondary | ICD-10-CM | POA: Diagnosis not present

## 2016-05-09 DIAGNOSIS — J029 Acute pharyngitis, unspecified: Secondary | ICD-10-CM | POA: Diagnosis not present

## 2016-05-09 DIAGNOSIS — R0981 Nasal congestion: Secondary | ICD-10-CM | POA: Diagnosis not present

## 2016-07-02 DIAGNOSIS — H401131 Primary open-angle glaucoma, bilateral, mild stage: Secondary | ICD-10-CM | POA: Diagnosis not present

## 2016-07-09 DIAGNOSIS — H401131 Primary open-angle glaucoma, bilateral, mild stage: Secondary | ICD-10-CM | POA: Diagnosis not present

## 2016-07-16 ENCOUNTER — Encounter (INDEPENDENT_AMBULATORY_CARE_PROVIDER_SITE_OTHER): Payer: Self-pay

## 2016-07-16 ENCOUNTER — Ambulatory Visit (INDEPENDENT_AMBULATORY_CARE_PROVIDER_SITE_OTHER): Payer: Self-pay | Admitting: Vascular Surgery

## 2016-07-16 ENCOUNTER — Other Ambulatory Visit (INDEPENDENT_AMBULATORY_CARE_PROVIDER_SITE_OTHER): Payer: Self-pay

## 2016-08-07 DIAGNOSIS — R0981 Nasal congestion: Secondary | ICD-10-CM | POA: Diagnosis not present

## 2016-08-12 ENCOUNTER — Other Ambulatory Visit (INDEPENDENT_AMBULATORY_CARE_PROVIDER_SITE_OTHER): Payer: Self-pay | Admitting: Vascular Surgery

## 2016-08-12 DIAGNOSIS — I739 Peripheral vascular disease, unspecified: Principal | ICD-10-CM

## 2016-08-12 DIAGNOSIS — I779 Disorder of arteries and arterioles, unspecified: Secondary | ICD-10-CM

## 2016-08-12 DIAGNOSIS — Z9889 Other specified postprocedural states: Secondary | ICD-10-CM

## 2016-08-13 ENCOUNTER — Ambulatory Visit (INDEPENDENT_AMBULATORY_CARE_PROVIDER_SITE_OTHER): Payer: PPO

## 2016-08-13 ENCOUNTER — Ambulatory Visit (INDEPENDENT_AMBULATORY_CARE_PROVIDER_SITE_OTHER): Payer: PPO | Admitting: Vascular Surgery

## 2016-08-13 ENCOUNTER — Encounter (INDEPENDENT_AMBULATORY_CARE_PROVIDER_SITE_OTHER): Payer: Self-pay

## 2016-08-13 DIAGNOSIS — I779 Disorder of arteries and arterioles, unspecified: Secondary | ICD-10-CM | POA: Diagnosis not present

## 2016-08-13 DIAGNOSIS — Z9889 Other specified postprocedural states: Secondary | ICD-10-CM | POA: Diagnosis not present

## 2016-08-13 DIAGNOSIS — I739 Peripheral vascular disease, unspecified: Principal | ICD-10-CM

## 2016-08-18 DIAGNOSIS — R55 Syncope and collapse: Secondary | ICD-10-CM | POA: Diagnosis not present

## 2016-08-18 DIAGNOSIS — E119 Type 2 diabetes mellitus without complications: Secondary | ICD-10-CM | POA: Diagnosis not present

## 2016-08-18 DIAGNOSIS — I10 Essential (primary) hypertension: Secondary | ICD-10-CM | POA: Diagnosis not present

## 2016-08-18 DIAGNOSIS — I714 Abdominal aortic aneurysm, without rupture: Secondary | ICD-10-CM | POA: Diagnosis not present

## 2016-08-18 DIAGNOSIS — I2510 Atherosclerotic heart disease of native coronary artery without angina pectoris: Secondary | ICD-10-CM | POA: Diagnosis not present

## 2016-08-18 DIAGNOSIS — R0602 Shortness of breath: Secondary | ICD-10-CM | POA: Diagnosis not present

## 2016-08-18 DIAGNOSIS — Z951 Presence of aortocoronary bypass graft: Secondary | ICD-10-CM | POA: Diagnosis not present

## 2016-08-22 ENCOUNTER — Encounter (INDEPENDENT_AMBULATORY_CARE_PROVIDER_SITE_OTHER): Payer: Self-pay

## 2016-09-03 DIAGNOSIS — Z79899 Other long term (current) drug therapy: Secondary | ICD-10-CM | POA: Diagnosis not present

## 2016-09-03 DIAGNOSIS — I10 Essential (primary) hypertension: Secondary | ICD-10-CM | POA: Diagnosis not present

## 2016-09-03 DIAGNOSIS — E119 Type 2 diabetes mellitus without complications: Secondary | ICD-10-CM | POA: Diagnosis not present

## 2016-09-09 DIAGNOSIS — Z79899 Other long term (current) drug therapy: Secondary | ICD-10-CM | POA: Diagnosis not present

## 2016-09-09 DIAGNOSIS — E119 Type 2 diabetes mellitus without complications: Secondary | ICD-10-CM | POA: Diagnosis not present

## 2016-09-09 DIAGNOSIS — E7800 Pure hypercholesterolemia, unspecified: Secondary | ICD-10-CM | POA: Diagnosis not present

## 2016-09-09 DIAGNOSIS — Z0000 Encounter for general adult medical examination without abnormal findings: Secondary | ICD-10-CM | POA: Diagnosis not present

## 2016-09-09 DIAGNOSIS — R0981 Nasal congestion: Secondary | ICD-10-CM | POA: Diagnosis not present

## 2016-09-11 DIAGNOSIS — I2510 Atherosclerotic heart disease of native coronary artery without angina pectoris: Secondary | ICD-10-CM | POA: Diagnosis not present

## 2016-09-11 DIAGNOSIS — Z951 Presence of aortocoronary bypass graft: Secondary | ICD-10-CM | POA: Diagnosis not present

## 2016-09-19 DIAGNOSIS — E119 Type 2 diabetes mellitus without complications: Secondary | ICD-10-CM | POA: Diagnosis not present

## 2016-09-19 DIAGNOSIS — I714 Abdominal aortic aneurysm, without rupture: Secondary | ICD-10-CM | POA: Diagnosis not present

## 2016-09-19 DIAGNOSIS — E1165 Type 2 diabetes mellitus with hyperglycemia: Secondary | ICD-10-CM | POA: Diagnosis not present

## 2016-09-19 DIAGNOSIS — I2510 Atherosclerotic heart disease of native coronary artery without angina pectoris: Secondary | ICD-10-CM | POA: Diagnosis not present

## 2016-09-19 DIAGNOSIS — Z951 Presence of aortocoronary bypass graft: Secondary | ICD-10-CM | POA: Diagnosis not present

## 2016-09-19 DIAGNOSIS — I10 Essential (primary) hypertension: Secondary | ICD-10-CM | POA: Diagnosis not present

## 2016-09-19 DIAGNOSIS — R0602 Shortness of breath: Secondary | ICD-10-CM | POA: Diagnosis not present

## 2016-09-19 DIAGNOSIS — R001 Bradycardia, unspecified: Secondary | ICD-10-CM | POA: Diagnosis not present

## 2016-09-19 DIAGNOSIS — I5022 Chronic systolic (congestive) heart failure: Secondary | ICD-10-CM | POA: Diagnosis not present

## 2016-09-19 DIAGNOSIS — R55 Syncope and collapse: Secondary | ICD-10-CM | POA: Diagnosis not present

## 2016-09-24 DIAGNOSIS — L821 Other seborrheic keratosis: Secondary | ICD-10-CM | POA: Diagnosis not present

## 2016-09-24 DIAGNOSIS — L820 Inflamed seborrheic keratosis: Secondary | ICD-10-CM | POA: Diagnosis not present

## 2016-09-24 DIAGNOSIS — I481 Persistent atrial fibrillation: Secondary | ICD-10-CM | POA: Diagnosis not present

## 2016-09-24 DIAGNOSIS — L570 Actinic keratosis: Secondary | ICD-10-CM | POA: Diagnosis not present

## 2016-09-24 DIAGNOSIS — L578 Other skin changes due to chronic exposure to nonionizing radiation: Secondary | ICD-10-CM | POA: Diagnosis not present

## 2016-09-30 DIAGNOSIS — I2510 Atherosclerotic heart disease of native coronary artery without angina pectoris: Secondary | ICD-10-CM | POA: Diagnosis not present

## 2016-09-30 DIAGNOSIS — I714 Abdominal aortic aneurysm, without rupture: Secondary | ICD-10-CM | POA: Diagnosis not present

## 2016-09-30 DIAGNOSIS — Z951 Presence of aortocoronary bypass graft: Secondary | ICD-10-CM | POA: Diagnosis not present

## 2016-09-30 DIAGNOSIS — R55 Syncope and collapse: Secondary | ICD-10-CM | POA: Diagnosis not present

## 2016-09-30 DIAGNOSIS — E119 Type 2 diabetes mellitus without complications: Secondary | ICD-10-CM | POA: Diagnosis not present

## 2016-09-30 DIAGNOSIS — I5022 Chronic systolic (congestive) heart failure: Secondary | ICD-10-CM | POA: Diagnosis not present

## 2016-09-30 DIAGNOSIS — I480 Paroxysmal atrial fibrillation: Secondary | ICD-10-CM | POA: Diagnosis not present

## 2016-09-30 DIAGNOSIS — E1165 Type 2 diabetes mellitus with hyperglycemia: Secondary | ICD-10-CM | POA: Diagnosis not present

## 2016-09-30 DIAGNOSIS — I10 Essential (primary) hypertension: Secondary | ICD-10-CM | POA: Diagnosis not present

## 2016-09-30 DIAGNOSIS — R0602 Shortness of breath: Secondary | ICD-10-CM | POA: Diagnosis not present

## 2016-10-03 DIAGNOSIS — R55 Syncope and collapse: Secondary | ICD-10-CM | POA: Diagnosis not present

## 2016-10-03 DIAGNOSIS — Z951 Presence of aortocoronary bypass graft: Secondary | ICD-10-CM | POA: Diagnosis not present

## 2016-10-06 DIAGNOSIS — J301 Allergic rhinitis due to pollen: Secondary | ICD-10-CM | POA: Diagnosis not present

## 2016-10-06 DIAGNOSIS — H903 Sensorineural hearing loss, bilateral: Secondary | ICD-10-CM | POA: Diagnosis not present

## 2016-10-17 DIAGNOSIS — J301 Allergic rhinitis due to pollen: Secondary | ICD-10-CM | POA: Diagnosis not present

## 2016-10-21 DIAGNOSIS — I714 Abdominal aortic aneurysm, without rupture: Secondary | ICD-10-CM | POA: Diagnosis not present

## 2016-10-21 DIAGNOSIS — I2510 Atherosclerotic heart disease of native coronary artery without angina pectoris: Secondary | ICD-10-CM | POA: Diagnosis not present

## 2016-10-21 DIAGNOSIS — R55 Syncope and collapse: Secondary | ICD-10-CM | POA: Diagnosis not present

## 2016-10-21 DIAGNOSIS — I5022 Chronic systolic (congestive) heart failure: Secondary | ICD-10-CM | POA: Diagnosis not present

## 2016-10-21 DIAGNOSIS — E119 Type 2 diabetes mellitus without complications: Secondary | ICD-10-CM | POA: Diagnosis not present

## 2016-10-21 DIAGNOSIS — R600 Localized edema: Secondary | ICD-10-CM | POA: Diagnosis not present

## 2016-10-21 DIAGNOSIS — I10 Essential (primary) hypertension: Secondary | ICD-10-CM | POA: Diagnosis not present

## 2016-10-21 DIAGNOSIS — R0602 Shortness of breath: Secondary | ICD-10-CM | POA: Diagnosis not present

## 2016-10-21 DIAGNOSIS — Z951 Presence of aortocoronary bypass graft: Secondary | ICD-10-CM | POA: Diagnosis not present

## 2016-10-21 DIAGNOSIS — R001 Bradycardia, unspecified: Secondary | ICD-10-CM | POA: Diagnosis not present

## 2016-12-16 DIAGNOSIS — I5022 Chronic systolic (congestive) heart failure: Secondary | ICD-10-CM | POA: Diagnosis not present

## 2016-12-16 DIAGNOSIS — R55 Syncope and collapse: Secondary | ICD-10-CM | POA: Diagnosis not present

## 2016-12-16 DIAGNOSIS — Z951 Presence of aortocoronary bypass graft: Secondary | ICD-10-CM | POA: Diagnosis not present

## 2016-12-16 DIAGNOSIS — I10 Essential (primary) hypertension: Secondary | ICD-10-CM | POA: Diagnosis not present

## 2016-12-16 DIAGNOSIS — R600 Localized edema: Secondary | ICD-10-CM | POA: Diagnosis not present

## 2016-12-16 DIAGNOSIS — I2510 Atherosclerotic heart disease of native coronary artery without angina pectoris: Secondary | ICD-10-CM | POA: Diagnosis not present

## 2016-12-17 DIAGNOSIS — Z4502 Encounter for adjustment and management of automatic implantable cardiac defibrillator: Secondary | ICD-10-CM | POA: Diagnosis not present

## 2016-12-17 DIAGNOSIS — J302 Other seasonal allergic rhinitis: Secondary | ICD-10-CM | POA: Diagnosis not present

## 2016-12-17 DIAGNOSIS — K219 Gastro-esophageal reflux disease without esophagitis: Secondary | ICD-10-CM | POA: Diagnosis not present

## 2016-12-17 DIAGNOSIS — N400 Enlarged prostate without lower urinary tract symptoms: Secondary | ICD-10-CM | POA: Diagnosis not present

## 2016-12-17 DIAGNOSIS — E785 Hyperlipidemia, unspecified: Secondary | ICD-10-CM | POA: Diagnosis not present

## 2016-12-17 DIAGNOSIS — I440 Atrioventricular block, first degree: Secondary | ICD-10-CM | POA: Diagnosis not present

## 2016-12-17 DIAGNOSIS — I252 Old myocardial infarction: Secondary | ICD-10-CM | POA: Diagnosis not present

## 2016-12-17 DIAGNOSIS — I110 Hypertensive heart disease with heart failure: Secondary | ICD-10-CM | POA: Diagnosis not present

## 2016-12-17 DIAGNOSIS — M1990 Unspecified osteoarthritis, unspecified site: Secondary | ICD-10-CM | POA: Diagnosis not present

## 2016-12-17 DIAGNOSIS — I4510 Unspecified right bundle-branch block: Secondary | ICD-10-CM | POA: Diagnosis not present

## 2016-12-17 DIAGNOSIS — R001 Bradycardia, unspecified: Secondary | ICD-10-CM | POA: Diagnosis not present

## 2016-12-17 DIAGNOSIS — E1165 Type 2 diabetes mellitus with hyperglycemia: Secondary | ICD-10-CM | POA: Diagnosis not present

## 2016-12-17 DIAGNOSIS — Z006 Encounter for examination for normal comparison and control in clinical research program: Secondary | ICD-10-CM | POA: Diagnosis not present

## 2016-12-17 DIAGNOSIS — I272 Pulmonary hypertension, unspecified: Secondary | ICD-10-CM | POA: Diagnosis not present

## 2016-12-17 DIAGNOSIS — R55 Syncope and collapse: Secondary | ICD-10-CM | POA: Diagnosis not present

## 2016-12-17 DIAGNOSIS — I2510 Atherosclerotic heart disease of native coronary artery without angina pectoris: Secondary | ICD-10-CM | POA: Diagnosis not present

## 2016-12-17 DIAGNOSIS — Z95810 Presence of automatic (implantable) cardiac defibrillator: Secondary | ICD-10-CM

## 2016-12-17 DIAGNOSIS — I429 Cardiomyopathy, unspecified: Secondary | ICD-10-CM | POA: Diagnosis not present

## 2016-12-17 DIAGNOSIS — I5022 Chronic systolic (congestive) heart failure: Secondary | ICD-10-CM | POA: Diagnosis not present

## 2016-12-17 DIAGNOSIS — I480 Paroxysmal atrial fibrillation: Secondary | ICD-10-CM | POA: Diagnosis not present

## 2016-12-17 DIAGNOSIS — I255 Ischemic cardiomyopathy: Secondary | ICD-10-CM | POA: Diagnosis not present

## 2016-12-17 DIAGNOSIS — I081 Rheumatic disorders of both mitral and tricuspid valves: Secondary | ICD-10-CM | POA: Diagnosis not present

## 2016-12-17 HISTORY — PX: ICD IMPLANT: EP1208

## 2016-12-17 HISTORY — DX: Presence of automatic (implantable) cardiac defibrillator: Z95.810

## 2016-12-18 DIAGNOSIS — I5022 Chronic systolic (congestive) heart failure: Secondary | ICD-10-CM | POA: Diagnosis not present

## 2016-12-18 DIAGNOSIS — R001 Bradycardia, unspecified: Secondary | ICD-10-CM | POA: Diagnosis not present

## 2016-12-19 DIAGNOSIS — I2510 Atherosclerotic heart disease of native coronary artery without angina pectoris: Secondary | ICD-10-CM | POA: Diagnosis not present

## 2016-12-19 DIAGNOSIS — I5022 Chronic systolic (congestive) heart failure: Secondary | ICD-10-CM | POA: Diagnosis not present

## 2016-12-19 DIAGNOSIS — R0789 Other chest pain: Secondary | ICD-10-CM | POA: Diagnosis not present

## 2016-12-19 DIAGNOSIS — I714 Abdominal aortic aneurysm, without rupture: Secondary | ICD-10-CM | POA: Diagnosis not present

## 2016-12-22 DIAGNOSIS — R0602 Shortness of breath: Secondary | ICD-10-CM | POA: Diagnosis not present

## 2016-12-22 DIAGNOSIS — I714 Abdominal aortic aneurysm, without rupture: Secondary | ICD-10-CM | POA: Diagnosis not present

## 2016-12-22 DIAGNOSIS — I499 Cardiac arrhythmia, unspecified: Secondary | ICD-10-CM | POA: Diagnosis not present

## 2016-12-22 DIAGNOSIS — R001 Bradycardia, unspecified: Secondary | ICD-10-CM | POA: Diagnosis not present

## 2016-12-22 DIAGNOSIS — R55 Syncope and collapse: Secondary | ICD-10-CM | POA: Diagnosis not present

## 2016-12-22 DIAGNOSIS — I5022 Chronic systolic (congestive) heart failure: Secondary | ICD-10-CM | POA: Diagnosis not present

## 2016-12-22 DIAGNOSIS — Z95810 Presence of automatic (implantable) cardiac defibrillator: Secondary | ICD-10-CM | POA: Diagnosis not present

## 2016-12-22 DIAGNOSIS — Z951 Presence of aortocoronary bypass graft: Secondary | ICD-10-CM | POA: Diagnosis not present

## 2016-12-22 DIAGNOSIS — R600 Localized edema: Secondary | ICD-10-CM | POA: Diagnosis not present

## 2016-12-29 DIAGNOSIS — E119 Type 2 diabetes mellitus without complications: Secondary | ICD-10-CM | POA: Diagnosis not present

## 2016-12-29 DIAGNOSIS — R0602 Shortness of breath: Secondary | ICD-10-CM | POA: Diagnosis not present

## 2016-12-29 DIAGNOSIS — R55 Syncope and collapse: Secondary | ICD-10-CM | POA: Diagnosis not present

## 2016-12-29 DIAGNOSIS — I714 Abdominal aortic aneurysm, without rupture: Secondary | ICD-10-CM | POA: Diagnosis not present

## 2016-12-29 DIAGNOSIS — R001 Bradycardia, unspecified: Secondary | ICD-10-CM | POA: Diagnosis not present

## 2016-12-29 DIAGNOSIS — E1165 Type 2 diabetes mellitus with hyperglycemia: Secondary | ICD-10-CM | POA: Diagnosis not present

## 2016-12-29 DIAGNOSIS — R600 Localized edema: Secondary | ICD-10-CM | POA: Diagnosis not present

## 2016-12-29 DIAGNOSIS — Z95810 Presence of automatic (implantable) cardiac defibrillator: Secondary | ICD-10-CM | POA: Diagnosis not present

## 2016-12-29 DIAGNOSIS — Z951 Presence of aortocoronary bypass graft: Secondary | ICD-10-CM | POA: Diagnosis not present

## 2017-01-07 DIAGNOSIS — H401131 Primary open-angle glaucoma, bilateral, mild stage: Secondary | ICD-10-CM | POA: Diagnosis not present

## 2017-02-10 DIAGNOSIS — I714 Abdominal aortic aneurysm, without rupture: Secondary | ICD-10-CM | POA: Diagnosis not present

## 2017-02-10 DIAGNOSIS — I10 Essential (primary) hypertension: Secondary | ICD-10-CM | POA: Diagnosis not present

## 2017-02-10 DIAGNOSIS — Z951 Presence of aortocoronary bypass graft: Secondary | ICD-10-CM | POA: Diagnosis not present

## 2017-02-10 DIAGNOSIS — R0602 Shortness of breath: Secondary | ICD-10-CM | POA: Diagnosis not present

## 2017-02-10 DIAGNOSIS — E119 Type 2 diabetes mellitus without complications: Secondary | ICD-10-CM | POA: Diagnosis not present

## 2017-02-10 DIAGNOSIS — I2510 Atherosclerotic heart disease of native coronary artery without angina pectoris: Secondary | ICD-10-CM | POA: Diagnosis not present

## 2017-02-10 DIAGNOSIS — I5022 Chronic systolic (congestive) heart failure: Secondary | ICD-10-CM | POA: Diagnosis not present

## 2017-02-10 DIAGNOSIS — R001 Bradycardia, unspecified: Secondary | ICD-10-CM | POA: Diagnosis not present

## 2017-02-10 DIAGNOSIS — Z95810 Presence of automatic (implantable) cardiac defibrillator: Secondary | ICD-10-CM | POA: Diagnosis not present

## 2017-02-13 DIAGNOSIS — I480 Paroxysmal atrial fibrillation: Secondary | ICD-10-CM | POA: Diagnosis not present

## 2017-02-13 DIAGNOSIS — Z95810 Presence of automatic (implantable) cardiac defibrillator: Secondary | ICD-10-CM | POA: Diagnosis not present

## 2017-03-06 DIAGNOSIS — E7800 Pure hypercholesterolemia, unspecified: Secondary | ICD-10-CM | POA: Diagnosis not present

## 2017-03-06 DIAGNOSIS — E119 Type 2 diabetes mellitus without complications: Secondary | ICD-10-CM | POA: Diagnosis not present

## 2017-03-06 DIAGNOSIS — Z79899 Other long term (current) drug therapy: Secondary | ICD-10-CM | POA: Diagnosis not present

## 2017-03-13 DIAGNOSIS — E7800 Pure hypercholesterolemia, unspecified: Secondary | ICD-10-CM | POA: Diagnosis not present

## 2017-03-13 DIAGNOSIS — I10 Essential (primary) hypertension: Secondary | ICD-10-CM | POA: Diagnosis not present

## 2017-03-13 DIAGNOSIS — E119 Type 2 diabetes mellitus without complications: Secondary | ICD-10-CM | POA: Diagnosis not present

## 2017-03-13 DIAGNOSIS — I480 Paroxysmal atrial fibrillation: Secondary | ICD-10-CM | POA: Diagnosis not present

## 2017-03-13 DIAGNOSIS — Z79899 Other long term (current) drug therapy: Secondary | ICD-10-CM | POA: Diagnosis not present

## 2017-03-13 DIAGNOSIS — I509 Heart failure, unspecified: Secondary | ICD-10-CM | POA: Diagnosis not present

## 2017-03-25 DIAGNOSIS — L570 Actinic keratosis: Secondary | ICD-10-CM | POA: Diagnosis not present

## 2017-03-25 DIAGNOSIS — D0439 Carcinoma in situ of skin of other parts of face: Secondary | ICD-10-CM | POA: Diagnosis not present

## 2017-03-25 DIAGNOSIS — L738 Other specified follicular disorders: Secondary | ICD-10-CM | POA: Diagnosis not present

## 2017-03-25 DIAGNOSIS — Z85828 Personal history of other malignant neoplasm of skin: Secondary | ICD-10-CM | POA: Diagnosis not present

## 2017-03-25 DIAGNOSIS — C44319 Basal cell carcinoma of skin of other parts of face: Secondary | ICD-10-CM | POA: Diagnosis not present

## 2017-03-25 DIAGNOSIS — D485 Neoplasm of uncertain behavior of skin: Secondary | ICD-10-CM | POA: Diagnosis not present

## 2017-03-25 DIAGNOSIS — L578 Other skin changes due to chronic exposure to nonionizing radiation: Secondary | ICD-10-CM | POA: Diagnosis not present

## 2017-04-09 DIAGNOSIS — L570 Actinic keratosis: Secondary | ICD-10-CM | POA: Diagnosis not present

## 2017-04-09 DIAGNOSIS — D0439 Carcinoma in situ of skin of other parts of face: Secondary | ICD-10-CM | POA: Diagnosis not present

## 2017-04-09 DIAGNOSIS — C44319 Basal cell carcinoma of skin of other parts of face: Secondary | ICD-10-CM | POA: Diagnosis not present

## 2017-04-15 DIAGNOSIS — H401131 Primary open-angle glaucoma, bilateral, mild stage: Secondary | ICD-10-CM | POA: Diagnosis not present

## 2017-05-12 DIAGNOSIS — N481 Balanitis: Secondary | ICD-10-CM | POA: Diagnosis not present

## 2017-05-19 DIAGNOSIS — I480 Paroxysmal atrial fibrillation: Secondary | ICD-10-CM | POA: Diagnosis not present

## 2017-06-17 DIAGNOSIS — Z951 Presence of aortocoronary bypass graft: Secondary | ICD-10-CM | POA: Diagnosis not present

## 2017-06-17 DIAGNOSIS — Z95810 Presence of automatic (implantable) cardiac defibrillator: Secondary | ICD-10-CM | POA: Diagnosis not present

## 2017-06-17 DIAGNOSIS — R0602 Shortness of breath: Secondary | ICD-10-CM | POA: Diagnosis not present

## 2017-06-17 DIAGNOSIS — E119 Type 2 diabetes mellitus without complications: Secondary | ICD-10-CM | POA: Diagnosis not present

## 2017-06-17 DIAGNOSIS — I5022 Chronic systolic (congestive) heart failure: Secondary | ICD-10-CM | POA: Diagnosis not present

## 2017-06-17 DIAGNOSIS — R600 Localized edema: Secondary | ICD-10-CM | POA: Diagnosis not present

## 2017-06-17 DIAGNOSIS — I10 Essential (primary) hypertension: Secondary | ICD-10-CM | POA: Diagnosis not present

## 2017-06-17 DIAGNOSIS — R001 Bradycardia, unspecified: Secondary | ICD-10-CM | POA: Diagnosis not present

## 2017-06-17 DIAGNOSIS — I714 Abdominal aortic aneurysm, without rupture: Secondary | ICD-10-CM | POA: Diagnosis not present

## 2017-07-22 DIAGNOSIS — H401131 Primary open-angle glaucoma, bilateral, mild stage: Secondary | ICD-10-CM | POA: Diagnosis not present

## 2017-08-12 ENCOUNTER — Other Ambulatory Visit (INDEPENDENT_AMBULATORY_CARE_PROVIDER_SITE_OTHER): Payer: Self-pay | Admitting: Vascular Surgery

## 2017-08-12 ENCOUNTER — Telehealth (INDEPENDENT_AMBULATORY_CARE_PROVIDER_SITE_OTHER): Payer: Self-pay | Admitting: Vascular Surgery

## 2017-08-12 DIAGNOSIS — I714 Abdominal aortic aneurysm, without rupture, unspecified: Secondary | ICD-10-CM

## 2017-08-12 DIAGNOSIS — I6523 Occlusion and stenosis of bilateral carotid arteries: Secondary | ICD-10-CM

## 2017-08-13 ENCOUNTER — Ambulatory Visit (INDEPENDENT_AMBULATORY_CARE_PROVIDER_SITE_OTHER): Payer: PPO

## 2017-08-13 ENCOUNTER — Ambulatory Visit (INDEPENDENT_AMBULATORY_CARE_PROVIDER_SITE_OTHER): Payer: PPO | Admitting: Vascular Surgery

## 2017-08-13 ENCOUNTER — Encounter (INDEPENDENT_AMBULATORY_CARE_PROVIDER_SITE_OTHER): Payer: PPO

## 2017-08-13 ENCOUNTER — Encounter (INDEPENDENT_AMBULATORY_CARE_PROVIDER_SITE_OTHER): Payer: Self-pay | Admitting: Vascular Surgery

## 2017-08-13 VITALS — BP 143/80 | HR 83 | Resp 16 | Ht 75.0 in | Wt 197.0 lb

## 2017-08-13 DIAGNOSIS — E782 Mixed hyperlipidemia: Secondary | ICD-10-CM | POA: Diagnosis not present

## 2017-08-13 DIAGNOSIS — I10 Essential (primary) hypertension: Secondary | ICD-10-CM | POA: Diagnosis not present

## 2017-08-13 DIAGNOSIS — I6523 Occlusion and stenosis of bilateral carotid arteries: Secondary | ICD-10-CM | POA: Diagnosis not present

## 2017-08-13 DIAGNOSIS — I2510 Atherosclerotic heart disease of native coronary artery without angina pectoris: Secondary | ICD-10-CM | POA: Diagnosis not present

## 2017-08-13 DIAGNOSIS — I714 Abdominal aortic aneurysm, without rupture, unspecified: Secondary | ICD-10-CM

## 2017-08-13 NOTE — Progress Notes (Signed)
MRN : [id number]  Nathaniel Woods is a 84 y.o. (11/10/1928) male who presents with chief complaint of  Chief Complaint  Patient presents with  . Follow-up    1 year EVAR and Carotid  .  History of Present Illness: The patient returns to the office for surveillance of an abdominal aortic aneurysm status post stent graft.   Patient denies abdominal pain or back pain, no other abdominal complaints. No groin related complaints. No symptoms consistent with distal embolization No changes in claudication distance.   There have been no interval changes in his overall healthcare since his last visit.   Patient denies amaurosis fugax or TIA symptoms.  No recent neurological changes.  There is no history of claudication or rest pain symptoms of the lower extremities. The patient denies angina or shortness of breath.    Current Meds  Medication Sig  . acetaminophen (TYLENOL) 500 MG tablet Take by mouth.  Marland Kitchen BIOTIN 5000 PO Take 1 tablet by mouth daily.  . Coenzyme Q10 (COQ10) 50 MG CAPS Take 50 mg by mouth daily.   . diazepam (VALIUM) 2 MG tablet Take by mouth.  Arne Cleveland 5 MG TABS tablet   . finasteride (PROSCAR) 5 MG tablet Take 5 mg by mouth daily.  . fluticasone (FLONASE) 50 MCG/ACT nasal spray USE 2 SPRAYS IN EACH NOSTRIL EVERY DAY  . Fluticasone-Salmeterol (ADVAIR) 250-50 MCG/DOSE AEPB Inhale 1 puff into the lungs 2 (two) times daily.  . furosemide (LASIX) 20 MG tablet TAKE 1 TABLET BY MOUTH DAILY  . latanoprost (XALATAN) 0.005 % ophthalmic solution PLACE ONE DROP INTO EACH EYE AT BEDTIME  . metFORMIN (GLUCOPHAGE) 500 MG tablet Take 500 mg by mouth daily with breakfast.  . metoprolol succinate (TOPROL-XL) 25 MG 24 hr tablet   . mirtazapine (REMERON) 15 MG tablet Take by mouth.  . montelukast (SINGULAIR) 10 MG tablet   . Multiple Vitamin (MULITIVITAMIN WITH MINERALS) TABS Take 1 tablet by mouth daily.  . Omega-3 Fatty Acids (FISH OIL) 1200 MG CAPS Take 2 capsules by mouth daily.  .  Rosuvastatin Calcium (CRESTOR PO) Take 2.5 mg by mouth every Monday, Wednesday, and Friday.  Marland Kitchen spironolactone (ALDACTONE) 25 MG tablet Take by mouth.    Past Medical History:  Diagnosis Date  . Angina   . Anxiety    "just before major surgery"  . Arthritis   . Asthma    childhood  . BPH associated with nocturia   . Coronary artery disease   . Hemorrhagic stroke ([place]) 09/2008   S/P fall  . Hyperlipidemia   . Hypertension   . Inferior MI ([place]) 10/2011  . Kidney stone   . Stroke ([place])   . Type II diabetes mellitus ([place])     Past Surgical History:  Procedure Laterality Date  . APPENDECTOMY  1947  . CARDIAC CATHETERIZATION  10/2011  . CORONARY ANGIOPLASTY WITH STENT PLACEMENT  2009   "2; made total of 6"  . CORONARY ARTERY BYPASS GRAFT  10/24/2011   Procedure: CORONARY ARTERY BYPASS GRAFTING (CABG);  Surgeon: Ivin Poot, MD;  Location: [facility];  Service: Open Heart Surgery;  Laterality: N/A;  Coronary Artery Bypass Graft times four using the left internal mammary artery and the right and left greater saphenous veins harvested endoscopically.  transesophageal Echocardiogram  . INTERTROCHANTERIC HIP FRACTURE SURGERY  05/1991   "pin, 2 screws, plate"; right  . LITHOTRIPSY  ~ 04/2011  . TONSILLECTOMY  1944  . VASECTOMY  ~ 1970  Social History Social History   Tobacco Use  . Smoking status: Former Smoker    Packs/day: 1.00    Years: 24.00    Pack years: 24.00    Types: Cigarettes    Last attempt to quit: 06/09/1970    Years since quitting: 47.2  . Smokeless tobacco: Never Used  Substance Use Topics  . Alcohol use: Yes    Alcohol/week: 16.8 oz    Types: 28 Shots of liquor per week  . Drug use: No    Family History History reviewed. No pertinent family history.  Allergies  Allergen Reactions  . Levofloxacin Rash  . Aspirin     Hemorrhagic Stroke  . Bee Venom     Other reaction(s): Other (See Comments) fever  . Chlorpheniramine     Other reaction(s): Other (See  Comments)  . Decongestant [Pseudoephedrine Hcl]     "don't remember"  . Simvastatin     Other reaction(s): Muscle Pain  . Zolpidem     Other reaction(s): Unknown "went bananas"  . Contrast Media [Iodinated Diagnostic Agents] Rash     REVIEW OF SYSTEMS (Negative unless checked)  Constitutional: [] Weight loss  [] Fever  [] Chills Cardiac: [] Chest pain   [] Chest pressure   [] Palpitations   [] Shortness of breath when laying flat   [] Shortness of breath with exertion. Vascular:  [] Pain in legs with walking   [] Pain in legs at rest  [] History of DVT   [] Phlebitis   [] Swelling in legs   [] Varicose veins   [] Non-healing ulcers Pulmonary:   [] Uses home oxygen   [] Productive cough   [] Hemoptysis   [] Wheeze  [] COPD   [] Asthma Neurologic:  [] Dizziness   [] Seizures   [] History of stroke   [] History of TIA  [] Aphasia   [] Vissual changes   [] Weakness or numbness in arm   [] Weakness or numbness in leg Musculoskeletal:   [] Joint swelling   [] Joint pain   [] Low back pain Hematologic:  [] Easy bruising  [] Easy bleeding   [] Hypercoagulable state   [] Anemic Gastrointestinal:  [] Diarrhea   [] Vomiting  [] Gastroesophageal reflux/heartburn   [] Difficulty swallowing. Genitourinary:  [] Chronic kidney disease   [] Difficult urination  [] Frequent urination   [] Blood in urine Skin:  [] Rashes   [] Ulcers  Psychological:  [] History of anxiety   []  History of major depression.  Physical Examination  Vitals:   08/13/17 0905 08/13/17 0906  BP: 136/83 (!) 143/80  Pulse: 87 83  Resp: 16   Weight: 197 lb (89.4 kg)   Height: 6\' 3"  (1.905 m)    Body mass index is 24.62 kg/m. Gen: WD/WN, NAD Head: /AT, No temporalis wasting.  Ear/Nose/Throat: Hearing grossly intact, nares w/o erythema or drainage Eyes: PER, EOMI, sclera nonicteric.  Neck: Supple, no large masses.   Pulmonary:  Good air movement, no audible wheezing bilaterally, no use of accessory muscles.  Cardiac: RRR, no JVD Vascular: No carotid bruits Vessel  Right Left  Radial Palpable Palpable  Brachial Palpable Palpable  Carotid Palpable Palpable  PT Palpable Palpable  DP Palpable Palpable  Gastrointestinal: Non-distended. No guarding/no peritoneal signs.  Musculoskeletal: M/S 5/5 throughout.  No deformity or atrophy.  Neurologic: CN 2-12 intact. Symmetrical.  Speech is fluent. Motor exam as listed above. Psychiatric: Judgment intact, Mood & affect appropriate for pt's clinical situation. Dermatologic: No rashes or ulcers noted.  No changes consistent with cellulitis. Lymph : No lichenification or skin changes of chronic lymphedema.  CBC Lab Results  Component Value Date   WBC 8.0 10/01/2013   HGB  13.5 10/01/2013   HCT 40.5 10/01/2013   MCV 105 (H) 10/01/2013   PLT 113 (L) 10/01/2013    BMET    Component Value Date/Time   NA 141 10/01/2013 1923   K 4.0 10/01/2013 1923   CL 110 (H) 10/01/2013 1923   CO2 23 10/01/2013 1923   GLUCOSE 111 (H) 10/01/2013 1923   BUN 21 (H) 10/01/2013 1923   CREATININE 0.84 10/01/2013 1923   CALCIUM 8.7 10/01/2013 1923   GFRNONAA >60 10/01/2013 1923   GFRAA >60 10/01/2013 1923   CrCl cannot be calculated (Patient's most recent lab result is older than the maximum 21 days allowed.).  COAG Lab Results  Component Value Date   INR 1.0 05/27/2012   INR 1.15 10/29/2011   INR 1.40 10/24/2011    Radiology No results found.   Assessment/Plan 1. Abdominal aortic aneurysm (AAA) without rupture (HCC) Recommend: Patient is status post successful endovascular repair of the AAA.   No further intervention is required at this time.   No endoleak is detected and the aneurysm sac is stable.  The patient will continue antiplatelet therapy as prescribed as well as aggressive management of hyperlipidemia. Exercise is again strongly encouraged.   However, endografts require continued surveillance with ultrasound or CT scan. This is mandatory to detect any changes that allow repressurization of the  aneurysm sac.  The patient is informed that this would be asymptomatic.  The patient is reminded that lifelong routine surveillance is a necessity with an endograft. Patient will continue to follow-up at 6 month intervals with ultrasound of the aorta.  - VAS US AORTA/IVC/ILIACS; Future  2. Bilateral carotid artery stenosis Recommend:  Given the patient's asymptomatic subcritical stenosis no further invasive testing or surgery at this time.  Duplex ultrasound shows <50% stenosis bilaterally.  Continue antiplatelet therapy as prescribed Continue management of CAD, HTN and Hyperlipidemia Healthy heart diet,  encouraged exercise at least 4 times per week Follow up in 12 months with duplex ultrasound and physical exam   - VAS US CAROTID; Future  3. CAD in native artery Continue cardiac and antihypertensive medications as already ordered and reviewed, no changes at this time.  Continue statin as ordered and reviewed, no changes at this time  Nitrates PRN for chest pain   4. Essential hypertension Continue antihypertensive medications as already ordered, these medications have been reviewed and there are no changes at this time.   5. Mixed hyperlipidemia Continue statin as ordered and reviewed, no changes at this time   Hortencia Pilar, MD  08/13/2017 9:34 AM

## 2017-08-14 ENCOUNTER — Encounter (INDEPENDENT_AMBULATORY_CARE_PROVIDER_SITE_OTHER): Payer: Self-pay | Admitting: Vascular Surgery

## 2017-08-14 DIAGNOSIS — I6529 Occlusion and stenosis of unspecified carotid artery: Secondary | ICD-10-CM | POA: Insufficient documentation

## 2017-08-18 DIAGNOSIS — I481 Persistent atrial fibrillation: Secondary | ICD-10-CM | POA: Diagnosis not present

## 2017-09-02 DIAGNOSIS — B029 Zoster without complications: Secondary | ICD-10-CM | POA: Diagnosis not present

## 2017-09-03 DIAGNOSIS — B0239 Other herpes zoster eye disease: Secondary | ICD-10-CM | POA: Diagnosis not present

## 2017-09-04 DIAGNOSIS — B0239 Other herpes zoster eye disease: Secondary | ICD-10-CM | POA: Diagnosis not present

## 2017-09-09 DIAGNOSIS — E7800 Pure hypercholesterolemia, unspecified: Secondary | ICD-10-CM | POA: Diagnosis not present

## 2017-09-09 DIAGNOSIS — Z79899 Other long term (current) drug therapy: Secondary | ICD-10-CM | POA: Diagnosis not present

## 2017-09-09 DIAGNOSIS — E119 Type 2 diabetes mellitus without complications: Secondary | ICD-10-CM | POA: Diagnosis not present

## 2017-09-10 DIAGNOSIS — B0232 Zoster iridocyclitis: Secondary | ICD-10-CM | POA: Diagnosis not present

## 2017-09-14 DIAGNOSIS — Z0000 Encounter for general adult medical examination without abnormal findings: Secondary | ICD-10-CM | POA: Diagnosis not present

## 2017-09-16 DIAGNOSIS — R55 Syncope and collapse: Secondary | ICD-10-CM | POA: Diagnosis not present

## 2017-09-16 DIAGNOSIS — R001 Bradycardia, unspecified: Secondary | ICD-10-CM | POA: Diagnosis not present

## 2017-09-16 DIAGNOSIS — I2510 Atherosclerotic heart disease of native coronary artery without angina pectoris: Secondary | ICD-10-CM | POA: Diagnosis not present

## 2017-09-16 DIAGNOSIS — I714 Abdominal aortic aneurysm, without rupture: Secondary | ICD-10-CM | POA: Diagnosis not present

## 2017-09-16 DIAGNOSIS — R600 Localized edema: Secondary | ICD-10-CM | POA: Diagnosis not present

## 2017-09-16 DIAGNOSIS — Z95810 Presence of automatic (implantable) cardiac defibrillator: Secondary | ICD-10-CM | POA: Diagnosis not present

## 2017-09-16 DIAGNOSIS — R0602 Shortness of breath: Secondary | ICD-10-CM | POA: Diagnosis not present

## 2017-09-16 DIAGNOSIS — I480 Paroxysmal atrial fibrillation: Secondary | ICD-10-CM | POA: Diagnosis not present

## 2017-09-16 DIAGNOSIS — I5022 Chronic systolic (congestive) heart failure: Secondary | ICD-10-CM | POA: Diagnosis not present

## 2017-09-16 DIAGNOSIS — Z951 Presence of aortocoronary bypass graft: Secondary | ICD-10-CM | POA: Diagnosis not present

## 2017-09-16 DIAGNOSIS — I10 Essential (primary) hypertension: Secondary | ICD-10-CM | POA: Diagnosis not present

## 2017-09-29 DIAGNOSIS — B0232 Zoster iridocyclitis: Secondary | ICD-10-CM | POA: Diagnosis not present

## 2017-10-12 DIAGNOSIS — E119 Type 2 diabetes mellitus without complications: Secondary | ICD-10-CM | POA: Diagnosis not present

## 2017-10-12 DIAGNOSIS — B353 Tinea pedis: Secondary | ICD-10-CM | POA: Diagnosis not present

## 2017-10-12 DIAGNOSIS — B351 Tinea unguium: Secondary | ICD-10-CM | POA: Diagnosis not present

## 2017-10-12 DIAGNOSIS — Z85828 Personal history of other malignant neoplasm of skin: Secondary | ICD-10-CM | POA: Diagnosis not present

## 2017-10-12 DIAGNOSIS — B029 Zoster without complications: Secondary | ICD-10-CM | POA: Diagnosis not present

## 2017-10-22 DIAGNOSIS — B0232 Zoster iridocyclitis: Secondary | ICD-10-CM | POA: Diagnosis not present

## 2017-10-26 DIAGNOSIS — L299 Pruritus, unspecified: Secondary | ICD-10-CM | POA: Diagnosis not present

## 2017-10-26 DIAGNOSIS — S70361A Insect bite (nonvenomous), right thigh, initial encounter: Secondary | ICD-10-CM | POA: Diagnosis not present

## 2017-11-17 DIAGNOSIS — R001 Bradycardia, unspecified: Secondary | ICD-10-CM | POA: Diagnosis not present

## 2018-01-13 DIAGNOSIS — H401131 Primary open-angle glaucoma, bilateral, mild stage: Secondary | ICD-10-CM | POA: Diagnosis not present

## 2018-01-20 DIAGNOSIS — R0602 Shortness of breath: Secondary | ICD-10-CM | POA: Diagnosis not present

## 2018-01-20 DIAGNOSIS — E119 Type 2 diabetes mellitus without complications: Secondary | ICD-10-CM | POA: Diagnosis not present

## 2018-01-20 DIAGNOSIS — I10 Essential (primary) hypertension: Secondary | ICD-10-CM | POA: Diagnosis not present

## 2018-01-20 DIAGNOSIS — Z951 Presence of aortocoronary bypass graft: Secondary | ICD-10-CM | POA: Diagnosis not present

## 2018-01-20 DIAGNOSIS — R55 Syncope and collapse: Secondary | ICD-10-CM | POA: Diagnosis not present

## 2018-01-20 DIAGNOSIS — I5022 Chronic systolic (congestive) heart failure: Secondary | ICD-10-CM | POA: Diagnosis not present

## 2018-01-20 DIAGNOSIS — R001 Bradycardia, unspecified: Secondary | ICD-10-CM | POA: Diagnosis not present

## 2018-01-20 DIAGNOSIS — I2510 Atherosclerotic heart disease of native coronary artery without angina pectoris: Secondary | ICD-10-CM | POA: Diagnosis not present

## 2018-01-20 DIAGNOSIS — E1165 Type 2 diabetes mellitus with hyperglycemia: Secondary | ICD-10-CM | POA: Diagnosis not present

## 2018-01-20 DIAGNOSIS — R600 Localized edema: Secondary | ICD-10-CM | POA: Diagnosis not present

## 2018-01-20 DIAGNOSIS — Z95810 Presence of automatic (implantable) cardiac defibrillator: Secondary | ICD-10-CM | POA: Diagnosis not present

## 2018-01-20 DIAGNOSIS — I480 Paroxysmal atrial fibrillation: Secondary | ICD-10-CM | POA: Diagnosis not present

## 2018-01-27 DIAGNOSIS — L853 Xerosis cutis: Secondary | ICD-10-CM | POA: Diagnosis not present

## 2018-01-27 DIAGNOSIS — L309 Dermatitis, unspecified: Secondary | ICD-10-CM | POA: Diagnosis not present

## 2018-01-27 DIAGNOSIS — B353 Tinea pedis: Secondary | ICD-10-CM | POA: Diagnosis not present

## 2018-02-16 DIAGNOSIS — I5022 Chronic systolic (congestive) heart failure: Secondary | ICD-10-CM | POA: Diagnosis not present

## 2018-03-10 DIAGNOSIS — E7800 Pure hypercholesterolemia, unspecified: Secondary | ICD-10-CM | POA: Diagnosis not present

## 2018-03-10 DIAGNOSIS — Z79899 Other long term (current) drug therapy: Secondary | ICD-10-CM | POA: Diagnosis not present

## 2018-03-10 DIAGNOSIS — B351 Tinea unguium: Secondary | ICD-10-CM | POA: Diagnosis not present

## 2018-03-10 DIAGNOSIS — B353 Tinea pedis: Secondary | ICD-10-CM | POA: Diagnosis not present

## 2018-03-10 DIAGNOSIS — E119 Type 2 diabetes mellitus without complications: Secondary | ICD-10-CM | POA: Diagnosis not present

## 2018-03-10 DIAGNOSIS — L603 Nail dystrophy: Secondary | ICD-10-CM | POA: Diagnosis not present

## 2018-03-12 DIAGNOSIS — I5022 Chronic systolic (congestive) heart failure: Secondary | ICD-10-CM | POA: Diagnosis not present

## 2018-03-12 DIAGNOSIS — Z79899 Other long term (current) drug therapy: Secondary | ICD-10-CM | POA: Diagnosis not present

## 2018-03-12 DIAGNOSIS — I480 Paroxysmal atrial fibrillation: Secondary | ICD-10-CM | POA: Diagnosis not present

## 2018-03-12 DIAGNOSIS — I10 Essential (primary) hypertension: Secondary | ICD-10-CM | POA: Diagnosis not present

## 2018-03-12 DIAGNOSIS — E119 Type 2 diabetes mellitus without complications: Secondary | ICD-10-CM | POA: Diagnosis not present

## 2018-03-12 DIAGNOSIS — E7800 Pure hypercholesterolemia, unspecified: Secondary | ICD-10-CM | POA: Diagnosis not present

## 2018-03-24 DIAGNOSIS — M79641 Pain in right hand: Secondary | ICD-10-CM | POA: Diagnosis not present

## 2018-03-24 DIAGNOSIS — M7989 Other specified soft tissue disorders: Secondary | ICD-10-CM | POA: Diagnosis not present

## 2018-03-24 DIAGNOSIS — S6991XA Unspecified injury of right wrist, hand and finger(s), initial encounter: Secondary | ICD-10-CM | POA: Diagnosis not present

## 2018-03-31 DIAGNOSIS — B351 Tinea unguium: Secondary | ICD-10-CM | POA: Diagnosis not present

## 2018-03-31 DIAGNOSIS — B353 Tinea pedis: Secondary | ICD-10-CM | POA: Diagnosis not present

## 2018-04-14 DIAGNOSIS — Z1283 Encounter for screening for malignant neoplasm of skin: Secondary | ICD-10-CM | POA: Diagnosis not present

## 2018-04-14 DIAGNOSIS — B351 Tinea unguium: Secondary | ICD-10-CM | POA: Diagnosis not present

## 2018-04-14 DIAGNOSIS — D1800 Hemangioma unspecified site: Secondary | ICD-10-CM | POA: Diagnosis not present

## 2018-04-14 DIAGNOSIS — D692 Other nonthrombocytopenic purpura: Secondary | ICD-10-CM | POA: Diagnosis not present

## 2018-04-14 DIAGNOSIS — Z85828 Personal history of other malignant neoplasm of skin: Secondary | ICD-10-CM | POA: Diagnosis not present

## 2018-04-14 DIAGNOSIS — L578 Other skin changes due to chronic exposure to nonionizing radiation: Secondary | ICD-10-CM | POA: Diagnosis not present

## 2018-04-14 DIAGNOSIS — B353 Tinea pedis: Secondary | ICD-10-CM | POA: Diagnosis not present

## 2018-04-14 DIAGNOSIS — D225 Melanocytic nevi of trunk: Secondary | ICD-10-CM | POA: Diagnosis not present

## 2018-04-14 DIAGNOSIS — L570 Actinic keratosis: Secondary | ICD-10-CM | POA: Diagnosis not present

## 2018-04-14 DIAGNOSIS — L821 Other seborrheic keratosis: Secondary | ICD-10-CM | POA: Diagnosis not present

## 2018-05-18 DIAGNOSIS — I480 Paroxysmal atrial fibrillation: Secondary | ICD-10-CM | POA: Diagnosis not present

## 2018-05-19 DIAGNOSIS — I5022 Chronic systolic (congestive) heart failure: Secondary | ICD-10-CM | POA: Diagnosis not present

## 2018-05-19 DIAGNOSIS — I714 Abdominal aortic aneurysm, without rupture: Secondary | ICD-10-CM | POA: Diagnosis not present

## 2018-05-19 DIAGNOSIS — R001 Bradycardia, unspecified: Secondary | ICD-10-CM | POA: Diagnosis not present

## 2018-05-19 DIAGNOSIS — I10 Essential (primary) hypertension: Secondary | ICD-10-CM | POA: Diagnosis not present

## 2018-05-19 DIAGNOSIS — R55 Syncope and collapse: Secondary | ICD-10-CM | POA: Diagnosis not present

## 2018-05-19 DIAGNOSIS — I2510 Atherosclerotic heart disease of native coronary artery without angina pectoris: Secondary | ICD-10-CM | POA: Diagnosis not present

## 2018-05-19 DIAGNOSIS — I480 Paroxysmal atrial fibrillation: Secondary | ICD-10-CM | POA: Diagnosis not present

## 2018-07-14 DIAGNOSIS — H401131 Primary open-angle glaucoma, bilateral, mild stage: Secondary | ICD-10-CM | POA: Diagnosis not present

## 2018-08-17 DIAGNOSIS — R001 Bradycardia, unspecified: Secondary | ICD-10-CM | POA: Diagnosis not present

## 2018-08-26 ENCOUNTER — Ambulatory Visit (INDEPENDENT_AMBULATORY_CARE_PROVIDER_SITE_OTHER): Payer: PPO | Admitting: Vascular Surgery

## 2018-08-26 ENCOUNTER — Ambulatory Visit (INDEPENDENT_AMBULATORY_CARE_PROVIDER_SITE_OTHER): Payer: PPO

## 2018-08-26 ENCOUNTER — Other Ambulatory Visit: Payer: Self-pay

## 2018-08-26 ENCOUNTER — Encounter (INDEPENDENT_AMBULATORY_CARE_PROVIDER_SITE_OTHER): Payer: Self-pay | Admitting: Vascular Surgery

## 2018-08-26 VITALS — BP 142/80 | HR 80 | Resp 16 | Ht 75.0 in | Wt 188.0 lb

## 2018-08-26 DIAGNOSIS — Z79899 Other long term (current) drug therapy: Secondary | ICD-10-CM | POA: Diagnosis not present

## 2018-08-26 DIAGNOSIS — I714 Abdominal aortic aneurysm, without rupture, unspecified: Secondary | ICD-10-CM

## 2018-08-26 DIAGNOSIS — I10 Essential (primary) hypertension: Secondary | ICD-10-CM

## 2018-08-26 DIAGNOSIS — I2510 Atherosclerotic heart disease of native coronary artery without angina pectoris: Secondary | ICD-10-CM

## 2018-08-26 DIAGNOSIS — I6523 Occlusion and stenosis of bilateral carotid arteries: Secondary | ICD-10-CM | POA: Diagnosis not present

## 2018-08-26 DIAGNOSIS — E1159 Type 2 diabetes mellitus with other circulatory complications: Secondary | ICD-10-CM | POA: Diagnosis not present

## 2018-08-26 DIAGNOSIS — Z87891 Personal history of nicotine dependence: Secondary | ICD-10-CM

## 2018-08-26 DIAGNOSIS — Z7984 Long term (current) use of oral hypoglycemic drugs: Secondary | ICD-10-CM | POA: Diagnosis not present

## 2018-08-26 NOTE — Progress Notes (Signed)
MRN : [id number]  Nathaniel Woods is a 85 y.o. (04/08/1929) male who presents with chief complaint of  Chief Complaint  Patient presents with  . Follow-up    ultrasound follow up  .  History of Present Illness:   The patient returns to the office for surveillance of an abdominal aortic aneurysm status post stent graft.   Patient denies abdominal pain or back pain, no other abdominal complaints. No groin related complaints. No symptoms consistent with distal embolization No changes in claudication distance.   There have been no interval changes in his overall healthcare since his last visit.   Patient denies amaurosis fugax or TIA symptoms.  No recent neurological changes.  There is no history of claudication or rest pain symptoms of the lower extremities. The patient denies angina or shortness of breath.   Duplex ultrasound of the AAA shows an intact stent with a sac that is 5.9 cm and stable  Duplex ultrasound of the carotid arteries shows bilateral <40% stenosis.  Current Meds  Medication Sig  . acetaminophen (TYLENOL) 500 MG tablet Take by mouth.  Marland Kitchen amoxicillin (AMOXIL) 500 MG capsule Take 500 mg by mouth 3 (three) times daily. Take 4 capsules one hour prior to dental procedure  . Coenzyme Q10 (COQ10) 50 MG CAPS Take 50 mg by mouth daily.   . diazepam (VALIUM) 2 MG tablet Take by mouth.  Arne Cleveland 5 MG TABS tablet Take by mouth 2 (two) times daily.   . finasteride (PROSCAR) 5 MG tablet Take 5 mg by mouth daily.  . fluticasone (FLONASE) 50 MCG/ACT nasal spray USE 2 SPRAYS IN EACH NOSTRIL EVERY DAY  . Fluticasone-Salmeterol (ADVAIR) 250-50 MCG/DOSE AEPB Inhale 1 puff into the lungs 2 (two) times daily.  . furosemide (LASIX) 20 MG tablet TAKE 1 TABLET BY MOUTH DAILY  . latanoprost (XALATAN) 0.005 % ophthalmic solution PLACE ONE DROP INTO EACH EYE AT BEDTIME  . metFORMIN (GLUCOPHAGE) 500 MG tablet Take 500 mg by mouth daily with breakfast.  . metoprolol succinate  (TOPROL-XL) 25 MG 24 hr tablet   . montelukast (SINGULAIR) 10 MG tablet   . Multiple Vitamin (MULITIVITAMIN WITH MINERALS) TABS Take 1 tablet by mouth daily.  . Omega-3 Fatty Acids (FISH OIL) 1200 MG CAPS Take 2 capsules by mouth daily.  . Rosuvastatin Calcium (CRESTOR PO) Take 2.5 mg by mouth every Monday, Wednesday, and Friday.  Marland Kitchen spironolactone (ALDACTONE) 25 MG tablet Take 12.5 mg by mouth.     Past Medical History:  Diagnosis Date  . Angina   . Anxiety    "just before major surgery"  . Arthritis   . Asthma    childhood  . BPH associated with nocturia   . Coronary artery disease   . Hemorrhagic stroke ([place]) 09/2008   S/P fall  . Hyperlipidemia   . Hypertension   . Inferior MI ([place]) 10/2011  . Kidney stone   . Stroke ([place])   . Type II diabetes mellitus ([place])     Past Surgical History:  Procedure Laterality Date  . APPENDECTOMY  1947  . CARDIAC CATHETERIZATION  10/2011  . CORONARY ANGIOPLASTY WITH STENT PLACEMENT  2009   "2; made total of 6"  . CORONARY ARTERY BYPASS GRAFT  10/24/2011   Procedure: CORONARY ARTERY BYPASS GRAFTING (CABG);  Surgeon: Ivin Poot, MD;  Location: [facility];  Service: Open Heart Surgery;  Laterality: N/A;  Coronary Artery Bypass Graft times four using the left internal mammary artery and the  right and left greater saphenous veins harvested endoscopically.  transesophageal Echocardiogram  . INTERTROCHANTERIC HIP FRACTURE SURGERY  05/1991   "pin, 2 screws, plate"; right  . LITHOTRIPSY  ~ 04/2011  . TONSILLECTOMY  1944  . VASECTOMY  ~ 50    Social History Social History   Tobacco Use  . Smoking status: Former Smoker    Packs/day: 1.00    Years: 24.00    Pack years: 24.00    Types: Cigarettes    Last attempt to quit: 06/09/1970    Years since quitting: 48.2  . Smokeless tobacco: Never Used  Substance Use Topics  . Alcohol use: Yes    Alcohol/week: 28.0 standard drinks    Types: 28 Shots of liquor per week  . Drug use: No    Family  History Family History  Problem Relation Age of Onset  . Breast cancer Sister     Allergies  Allergen Reactions  . Levofloxacin Rash  . Aspirin     Hemorrhagic Stroke  . Bee Venom     Other reaction(s): Other (See Comments) fever  . Chlorpheniramine     Other reaction(s): Other (See Comments)  . Decongestant [Pseudoephedrine Hcl]     "don't remember"  . Simvastatin     Other reaction(s): Muscle Pain  . Zolpidem     Other reaction(s): Unknown "went bananas"  . Contrast Media [Iodinated Diagnostic Agents] Rash     REVIEW OF SYSTEMS (Negative unless checked)  Constitutional: [] Weight loss  [] Fever  [] Chills Cardiac: [x] Chest pain   [] Chest pressure   [] Palpitations   [] Shortness of breath when laying flat   [] Shortness of breath with exertion. Vascular:  [] Pain in legs with walking   [] Pain in legs at rest  [] History of DVT   [] Phlebitis   [] Swelling in legs   [] Varicose veins   [] Non-healing ulcers Pulmonary:   [] Uses home oxygen   [] Productive cough   [] Hemoptysis   [] Wheeze  [] COPD   [] Asthma Neurologic:  [] Dizziness   [] Seizures   [] History of stroke   [] History of TIA  [] Aphasia   [] Vissual changes   [] Weakness or numbness in arm   [] Weakness or numbness in leg Musculoskeletal:   [] Joint swelling   [x] Joint pain   [] Low back pain Hematologic:  [] Easy bruising  [] Easy bleeding   [] Hypercoagulable state   [] Anemic Gastrointestinal:  [] Diarrhea   [] Vomiting  [] Gastroesophageal reflux/heartburn   [] Difficulty swallowing. Genitourinary:  [] Chronic kidney disease   [] Difficult urination  [] Frequent urination   [] Blood in urine Skin:  [] Rashes   [] Ulcers  Psychological:  [] History of anxiety   []  History of major depression.  Physical Examination  Vitals:   08/26/18 0852  BP: (!) 142/80  Pulse: 80  Resp: 16  Weight: 188 lb (85.3 kg)  Height: 6\' 3"  (1.905 m)   Body mass index is 23.5 kg/m. Gen: WD/WN, NAD Head: [place]/AT, No temporalis wasting.  Ear/Nose/Throat: Hearing  grossly intact, nares w/o erythema or drainage Eyes: PER, EOMI, sclera nonicteric.  Neck: Supple, no large masses.   Pulmonary:  Good air movement, no audible wheezing bilaterally, no use of accessory muscles.  Cardiac: RRR, no JVD Vascular:  Vessel Right Left  Radial Palpable Palpable  Gastrointestinal: Non-distended. No guarding/no peritoneal signs.  Musculoskeletal: M/S 5/5 throughout.  No deformity or atrophy.  Neurologic: CN 2-12 intact. Symmetrical.  Speech is fluent. Motor exam as listed above. Psychiatric: Judgment intact, Mood & affect appropriate for pt's clinical situation. Dermatologic: No rashes or ulcers noted.  No changes consistent with cellulitis. Lymph : No lichenification or skin changes of chronic lymphedema.  CBC Lab Results  Component Value Date   WBC 8.0 10/01/2013   HGB 13.5 10/01/2013   HCT 40.5 10/01/2013   MCV 105 (H) 10/01/2013   PLT 113 (L) 10/01/2013    BMET    Component Value Date/Time   NA 141 10/01/2013 1923   K 4.0 10/01/2013 1923   CL 110 (H) 10/01/2013 1923   CO2 23 10/01/2013 1923   GLUCOSE 111 (H) 10/01/2013 1923   BUN 21 (H) 10/01/2013 1923   CREATININE 0.84 10/01/2013 1923   CALCIUM 8.7 10/01/2013 1923   GFRNONAA >60 10/01/2013 1923   GFRAA >60 10/01/2013 1923   CrCl cannot be calculated (Patient's most recent lab result is older than the maximum 21 days allowed.).  COAG Lab Results  Component Value Date   INR 1.0 05/27/2012   INR 1.15 10/29/2011   INR 1.40 10/24/2011    Radiology No results found.   Assessment/Plan 1. Abdominal aortic aneurysm (AAA) without rupture (HCC) Recommend: Patient is status post successful endovascular repair of the AAA.   No further intervention is required at this time.   No endoleak is detected and the aneurysm sac is stable.  The patient will continue antiplatelet therapy as prescribed as well as aggressive management of hyperlipidemia. Exercise is again strongly encouraged.   However,  endografts require continued surveillance with ultrasound or CT scan. This is mandatory to detect any changes that allow repressurization of the aneurysm sac.  The patient is informed that this would be asymptomatic.  The patient is reminded that lifelong routine surveillance is a necessity with an endograft. Patient will continue to follow-up at 6 month intervals with ultrasound of the aorta.  - VAS US AORTA/IVC/ILIACS; Future  2. Bilateral carotid artery stenosis Recommend:  Given the patient's asymptomatic subcritical stenosis no further invasive testing or surgery at this time.  Duplex ultrasound shows <40% stenosis bilaterally.  Continue antiplatelet therapy as prescribed Continue management of CAD, HTN and Hyperlipidemia Healthy heart diet,  encouraged exercise at least 4 times per week  Follow up in 24-36  months with duplex ultrasound and physical exam base on the patient's age and minimal carotid disease  3. Essential hypertension Continue antihypertensive medications as already ordered, these medications have been reviewed and there are no changes at this time.   4. CAD in native artery Continue cardiac and antihypertensive medications as already ordered and reviewed, no changes at this time.  Continue statin as ordered and reviewed, no changes at this time  Nitrates PRN for chest pain   5. Type 2 diabetes mellitus with other circulatory complication, without long-term current use of insulin (HCC) Continue hypoglycemic medications as already ordered, these medications have been reviewed and there are no changes at this time.  Hgb A1C to be monitored as already arranged by primary service     Hortencia Pilar, MD  08/26/2018 9:01 AM

## 2018-09-09 DIAGNOSIS — E119 Type 2 diabetes mellitus without complications: Secondary | ICD-10-CM | POA: Diagnosis not present

## 2018-09-09 DIAGNOSIS — Z79899 Other long term (current) drug therapy: Secondary | ICD-10-CM | POA: Diagnosis not present

## 2018-09-09 DIAGNOSIS — E7800 Pure hypercholesterolemia, unspecified: Secondary | ICD-10-CM | POA: Diagnosis not present

## 2018-09-15 DIAGNOSIS — I714 Abdominal aortic aneurysm, without rupture: Secondary | ICD-10-CM | POA: Diagnosis not present

## 2018-09-15 DIAGNOSIS — I5022 Chronic systolic (congestive) heart failure: Secondary | ICD-10-CM | POA: Diagnosis not present

## 2018-09-15 DIAGNOSIS — R55 Syncope and collapse: Secondary | ICD-10-CM | POA: Diagnosis not present

## 2018-09-15 DIAGNOSIS — R600 Localized edema: Secondary | ICD-10-CM | POA: Diagnosis not present

## 2018-09-15 DIAGNOSIS — I10 Essential (primary) hypertension: Secondary | ICD-10-CM | POA: Diagnosis not present

## 2018-09-15 DIAGNOSIS — I480 Paroxysmal atrial fibrillation: Secondary | ICD-10-CM | POA: Diagnosis not present

## 2018-09-15 DIAGNOSIS — R001 Bradycardia, unspecified: Secondary | ICD-10-CM | POA: Diagnosis not present

## 2018-09-15 DIAGNOSIS — Z95810 Presence of automatic (implantable) cardiac defibrillator: Secondary | ICD-10-CM | POA: Diagnosis not present

## 2018-09-15 DIAGNOSIS — R0602 Shortness of breath: Secondary | ICD-10-CM | POA: Diagnosis not present

## 2018-09-15 DIAGNOSIS — E1165 Type 2 diabetes mellitus with hyperglycemia: Secondary | ICD-10-CM | POA: Diagnosis not present

## 2018-09-15 DIAGNOSIS — L97511 Non-pressure chronic ulcer of other part of right foot limited to breakdown of skin: Secondary | ICD-10-CM | POA: Diagnosis not present

## 2018-09-15 DIAGNOSIS — Z951 Presence of aortocoronary bypass graft: Secondary | ICD-10-CM | POA: Diagnosis not present

## 2018-10-20 DIAGNOSIS — L570 Actinic keratosis: Secondary | ICD-10-CM | POA: Diagnosis not present

## 2018-10-20 DIAGNOSIS — D692 Other nonthrombocytopenic purpura: Secondary | ICD-10-CM | POA: Diagnosis not present

## 2018-10-20 DIAGNOSIS — L821 Other seborrheic keratosis: Secondary | ICD-10-CM | POA: Diagnosis not present

## 2018-10-20 DIAGNOSIS — L578 Other skin changes due to chronic exposure to nonionizing radiation: Secondary | ICD-10-CM | POA: Diagnosis not present

## 2018-10-20 DIAGNOSIS — L820 Inflamed seborrheic keratosis: Secondary | ICD-10-CM | POA: Diagnosis not present

## 2018-11-15 DIAGNOSIS — Z0000 Encounter for general adult medical examination without abnormal findings: Secondary | ICD-10-CM | POA: Diagnosis not present

## 2018-12-14 DIAGNOSIS — I5022 Chronic systolic (congestive) heart failure: Secondary | ICD-10-CM | POA: Diagnosis not present

## 2019-01-12 DIAGNOSIS — H401131 Primary open-angle glaucoma, bilateral, mild stage: Secondary | ICD-10-CM | POA: Diagnosis not present

## 2019-01-19 DIAGNOSIS — E119 Type 2 diabetes mellitus without complications: Secondary | ICD-10-CM | POA: Diagnosis not present

## 2019-01-19 DIAGNOSIS — R001 Bradycardia, unspecified: Secondary | ICD-10-CM | POA: Diagnosis not present

## 2019-01-19 DIAGNOSIS — I10 Essential (primary) hypertension: Secondary | ICD-10-CM | POA: Diagnosis not present

## 2019-01-19 DIAGNOSIS — R55 Syncope and collapse: Secondary | ICD-10-CM | POA: Diagnosis not present

## 2019-01-19 DIAGNOSIS — I2510 Atherosclerotic heart disease of native coronary artery without angina pectoris: Secondary | ICD-10-CM | POA: Diagnosis not present

## 2019-01-19 DIAGNOSIS — I5022 Chronic systolic (congestive) heart failure: Secondary | ICD-10-CM | POA: Diagnosis not present

## 2019-01-19 DIAGNOSIS — R0602 Shortness of breath: Secondary | ICD-10-CM | POA: Diagnosis not present

## 2019-01-19 DIAGNOSIS — Z95810 Presence of automatic (implantable) cardiac defibrillator: Secondary | ICD-10-CM | POA: Diagnosis not present

## 2019-01-19 DIAGNOSIS — I714 Abdominal aortic aneurysm, without rupture: Secondary | ICD-10-CM | POA: Diagnosis not present

## 2019-01-19 DIAGNOSIS — I480 Paroxysmal atrial fibrillation: Secondary | ICD-10-CM | POA: Diagnosis not present

## 2019-01-19 DIAGNOSIS — Z951 Presence of aortocoronary bypass graft: Secondary | ICD-10-CM | POA: Diagnosis not present

## 2019-01-19 DIAGNOSIS — R600 Localized edema: Secondary | ICD-10-CM | POA: Diagnosis not present

## 2019-02-11 DIAGNOSIS — L601 Onycholysis: Secondary | ICD-10-CM | POA: Diagnosis not present

## 2019-02-11 DIAGNOSIS — B351 Tinea unguium: Secondary | ICD-10-CM | POA: Diagnosis not present

## 2019-02-11 DIAGNOSIS — M79672 Pain in left foot: Secondary | ICD-10-CM | POA: Diagnosis not present

## 2019-03-15 DIAGNOSIS — R001 Bradycardia, unspecified: Secondary | ICD-10-CM | POA: Diagnosis not present

## 2019-03-28 ENCOUNTER — Emergency Department: Payer: PPO

## 2019-03-28 ENCOUNTER — Encounter: Payer: Self-pay | Admitting: Emergency Medicine

## 2019-03-28 ENCOUNTER — Other Ambulatory Visit: Payer: Self-pay

## 2019-03-28 ENCOUNTER — Inpatient Hospital Stay
Admission: EM | Admit: 2019-03-28 | Discharge: 2019-03-30 | DRG: 536 | Disposition: A | Discharge status: Skilled Nursing Facility | Payer: PPO | Attending: Internal Medicine | Admitting: Internal Medicine

## 2019-03-28 DIAGNOSIS — E785 Hyperlipidemia, unspecified: Secondary | ICD-10-CM | POA: Diagnosis present

## 2019-03-28 DIAGNOSIS — H9190 Unspecified hearing loss, unspecified ear: Secondary | ICD-10-CM | POA: Diagnosis present

## 2019-03-28 DIAGNOSIS — S299XXA Unspecified injury of thorax, initial encounter: Secondary | ICD-10-CM | POA: Diagnosis not present

## 2019-03-28 DIAGNOSIS — I714 Abdominal aortic aneurysm, without rupture: Secondary | ICD-10-CM | POA: Diagnosis not present

## 2019-03-28 DIAGNOSIS — R351 Nocturia: Secondary | ICD-10-CM | POA: Diagnosis not present

## 2019-03-28 DIAGNOSIS — I252 Old myocardial infarction: Secondary | ICD-10-CM | POA: Diagnosis not present

## 2019-03-28 DIAGNOSIS — Z886 Allergy status to analgesic agent status: Secondary | ICD-10-CM | POA: Diagnosis not present

## 2019-03-28 DIAGNOSIS — W19XXXA Unspecified fall, initial encounter: Secondary | ICD-10-CM

## 2019-03-28 DIAGNOSIS — Z20828 Contact with and (suspected) exposure to other viral communicable diseases: Secondary | ICD-10-CM | POA: Diagnosis present

## 2019-03-28 DIAGNOSIS — I110 Hypertensive heart disease with heart failure: Secondary | ICD-10-CM | POA: Diagnosis not present

## 2019-03-28 DIAGNOSIS — Z87891 Personal history of nicotine dependence: Secondary | ICD-10-CM | POA: Diagnosis not present

## 2019-03-28 DIAGNOSIS — M160 Bilateral primary osteoarthritis of hip: Secondary | ICD-10-CM | POA: Diagnosis not present

## 2019-03-28 DIAGNOSIS — I700 Atherosclerosis of aorta: Secondary | ICD-10-CM | POA: Diagnosis not present

## 2019-03-28 DIAGNOSIS — S72009A Fracture of unspecified part of neck of unspecified femur, initial encounter for closed fracture: Secondary | ICD-10-CM | POA: Diagnosis present

## 2019-03-28 DIAGNOSIS — R279 Unspecified lack of coordination: Secondary | ICD-10-CM | POA: Diagnosis not present

## 2019-03-28 DIAGNOSIS — R52 Pain, unspecified: Secondary | ICD-10-CM | POA: Diagnosis not present

## 2019-03-28 DIAGNOSIS — Z803 Family history of malignant neoplasm of breast: Secondary | ICD-10-CM

## 2019-03-28 DIAGNOSIS — R338 Other retention of urine: Secondary | ICD-10-CM | POA: Diagnosis not present

## 2019-03-28 DIAGNOSIS — Z7951 Long term (current) use of inhaled steroids: Secondary | ICD-10-CM | POA: Diagnosis not present

## 2019-03-28 DIAGNOSIS — I4510 Unspecified right bundle-branch block: Secondary | ICD-10-CM | POA: Diagnosis not present

## 2019-03-28 DIAGNOSIS — G4700 Insomnia, unspecified: Secondary | ICD-10-CM | POA: Diagnosis not present

## 2019-03-28 DIAGNOSIS — E119 Type 2 diabetes mellitus without complications: Secondary | ICD-10-CM | POA: Diagnosis present

## 2019-03-28 DIAGNOSIS — Z8673 Personal history of transient ischemic attack (TIA), and cerebral infarction without residual deficits: Secondary | ICD-10-CM

## 2019-03-28 DIAGNOSIS — Z91041 Radiographic dye allergy status: Secondary | ICD-10-CM

## 2019-03-28 DIAGNOSIS — Z955 Presence of coronary angioplasty implant and graft: Secondary | ICD-10-CM | POA: Diagnosis not present

## 2019-03-28 DIAGNOSIS — S72009 Fracture of unspecified part of neck of unspecified femur: Secondary | ICD-10-CM | POA: Diagnosis not present

## 2019-03-28 DIAGNOSIS — Z951 Presence of aortocoronary bypass graft: Secondary | ICD-10-CM | POA: Diagnosis not present

## 2019-03-28 DIAGNOSIS — M25572 Pain in left ankle and joints of left foot: Secondary | ICD-10-CM | POA: Diagnosis not present

## 2019-03-28 DIAGNOSIS — Z7984 Long term (current) use of oral hypoglycemic drugs: Secondary | ICD-10-CM

## 2019-03-28 DIAGNOSIS — I10 Essential (primary) hypertension: Secondary | ICD-10-CM | POA: Diagnosis not present

## 2019-03-28 DIAGNOSIS — N401 Enlarged prostate with lower urinary tract symptoms: Secondary | ICD-10-CM | POA: Diagnosis not present

## 2019-03-28 DIAGNOSIS — R531 Weakness: Secondary | ICD-10-CM | POA: Diagnosis not present

## 2019-03-28 DIAGNOSIS — I482 Chronic atrial fibrillation, unspecified: Secondary | ICD-10-CM | POA: Diagnosis not present

## 2019-03-28 DIAGNOSIS — J45909 Unspecified asthma, uncomplicated: Secondary | ICD-10-CM | POA: Diagnosis not present

## 2019-03-28 DIAGNOSIS — H409 Unspecified glaucoma: Secondary | ICD-10-CM | POA: Diagnosis not present

## 2019-03-28 DIAGNOSIS — I2510 Atherosclerotic heart disease of native coronary artery without angina pectoris: Secondary | ICD-10-CM | POA: Diagnosis not present

## 2019-03-28 DIAGNOSIS — Z03818 Encounter for observation for suspected exposure to other biological agents ruled out: Secondary | ICD-10-CM | POA: Diagnosis not present

## 2019-03-28 DIAGNOSIS — W19XXXD Unspecified fall, subsequent encounter: Secondary | ICD-10-CM | POA: Diagnosis not present

## 2019-03-28 DIAGNOSIS — Z881 Allergy status to other antibiotic agents status: Secondary | ICD-10-CM | POA: Diagnosis not present

## 2019-03-28 DIAGNOSIS — M25551 Pain in right hip: Secondary | ICD-10-CM | POA: Diagnosis not present

## 2019-03-28 DIAGNOSIS — Z743 Need for continuous supervision: Secondary | ICD-10-CM | POA: Diagnosis not present

## 2019-03-28 DIAGNOSIS — S72111A Displaced fracture of greater trochanter of right femur, initial encounter for closed fracture: Secondary | ICD-10-CM | POA: Diagnosis not present

## 2019-03-28 DIAGNOSIS — S72111D Displaced fracture of greater trochanter of right femur, subsequent encounter for closed fracture with routine healing: Secondary | ICD-10-CM | POA: Diagnosis not present

## 2019-03-28 DIAGNOSIS — F419 Anxiety disorder, unspecified: Secondary | ICD-10-CM | POA: Diagnosis not present

## 2019-03-28 DIAGNOSIS — Z7901 Long term (current) use of anticoagulants: Secondary | ICD-10-CM | POA: Diagnosis not present

## 2019-03-28 DIAGNOSIS — Z888 Allergy status to other drugs, medicaments and biological substances status: Secondary | ICD-10-CM | POA: Diagnosis not present

## 2019-03-28 DIAGNOSIS — X501XXA Overexertion from prolonged static or awkward postures, initial encounter: Secondary | ICD-10-CM | POA: Diagnosis not present

## 2019-03-28 DIAGNOSIS — I5022 Chronic systolic (congestive) heart failure: Secondary | ICD-10-CM | POA: Diagnosis not present

## 2019-03-28 DIAGNOSIS — D126 Benign neoplasm of colon, unspecified: Secondary | ICD-10-CM | POA: Diagnosis not present

## 2019-03-28 DIAGNOSIS — Z79899 Other long term (current) drug therapy: Secondary | ICD-10-CM

## 2019-03-28 DIAGNOSIS — S72001A Fracture of unspecified part of neck of right femur, initial encounter for closed fracture: Secondary | ICD-10-CM

## 2019-03-28 DIAGNOSIS — M1990 Unspecified osteoarthritis, unspecified site: Secondary | ICD-10-CM | POA: Diagnosis not present

## 2019-03-28 DIAGNOSIS — F329 Major depressive disorder, single episode, unspecified: Secondary | ICD-10-CM | POA: Diagnosis not present

## 2019-03-28 LAB — CBC WITH DIFFERENTIAL/PLATELET
Abs Immature Granulocytes: 0.07 10*3/uL (ref 0.00–0.07)
Basophils Absolute: 0.1 10*3/uL (ref 0.0–0.1)
Basophils Relative: 1 %
Eosinophils Absolute: 0.2 10*3/uL (ref 0.0–0.5)
Eosinophils Relative: 2 %
HCT: 38.4 % — ABNORMAL LOW (ref 39.0–52.0)
Hemoglobin: 12.6 g/dL — ABNORMAL LOW (ref 13.0–17.0)
Immature Granulocytes: 1 %
Lymphocytes Relative: 20 %
Lymphs Abs: 2 10*3/uL (ref 0.7–4.0)
MCH: 34.8 pg — ABNORMAL HIGH (ref 26.0–34.0)
MCHC: 32.8 g/dL (ref 30.0–36.0)
MCV: 106.1 fL — ABNORMAL HIGH (ref 80.0–100.0)
Monocytes Absolute: 0.9 10*3/uL (ref 0.1–1.0)
Monocytes Relative: 9 %
Neutro Abs: 7.1 10*3/uL (ref 1.7–7.7)
Neutrophils Relative %: 67 %
Platelets: 123 10*3/uL — ABNORMAL LOW (ref 150–400)
RBC: 3.62 MIL/uL — ABNORMAL LOW (ref 4.22–5.81)
RDW: 13.1 % (ref 11.5–15.5)
WBC: 10.3 10*3/uL (ref 4.0–10.5)
nRBC: 0 % (ref 0.0–0.2)

## 2019-03-28 LAB — BASIC METABOLIC PANEL
Anion gap: 13 (ref 5–15)
BUN: 42 mg/dL — ABNORMAL HIGH (ref 8–23)
CO2: 20 mmol/L — ABNORMAL LOW (ref 22–32)
Calcium: 9.2 mg/dL (ref 8.9–10.3)
Chloride: 106 mmol/L (ref 98–111)
Creatinine, Ser: 1.43 mg/dL — ABNORMAL HIGH (ref 0.61–1.24)
GFR calc Af Amer: 50 mL/min — ABNORMAL LOW (ref >60)
GFR calc non Af Amer: 43 mL/min — ABNORMAL LOW (ref >60)
Glucose, Bld: 128 mg/dL — ABNORMAL HIGH (ref 70–99)
Potassium: 4.1 mmol/L (ref 3.5–5.1)
Sodium: 139 mmol/L (ref 135–145)

## 2019-03-28 LAB — PROTIME-INR
INR: 1.2 (ref 0.8–1.2)
Prothrombin Time: 14.8 seconds (ref 11.4–15.2)

## 2019-03-28 MED ORDER — SODIUM CHLORIDE 0.9 % IV BOLUS
500.0000 mL | Freq: Once | INTRAVENOUS | Status: AC
  Administered 2019-03-29: 500 mL via INTRAVENOUS

## 2019-03-28 NOTE — ED Triage Notes (Signed)
Pt presents from home via [facility] with c/o mechanical fall. Pt states he was bending over and tripped and fell on R hip. Shortening noted by ems, but no rotation. Pt has pins and plates already in Right hip. Pt c/o pain when bearing weight on affected extremity. Pt has pacemaker. Pt denies loc with fall.

## 2019-03-28 NOTE — H&P (Signed)
[facility] NAME: Nathaniel Woods    MR#:  [id number]  DATE OF BIRTH:  1929-04-08  DATE OF ADMISSION:  03/28/2019  PRIMARY CARE PHYSICIAN: Derinda Late, MD   REQUESTING/REFERRING PHYSICIAN: Joan Mayans, MD  CHIEF COMPLAINT:   Chief Complaint  Patient presents with  . Fall    HISTORY OF PRESENT ILLNESS:  Nathaniel Woods  is a 85 y.o. male who presents with chief complaint as above.  Patient presents the ED after fall with right hip pain.  He had a remote fracture that hip with repair, and imaging tonight shows new fracture in the region of his hardware.  Orthopedic surgery was contacted by ED physician, hospitalist were called for admission  PAST MEDICAL HISTORY:   Past Medical History:  Diagnosis Date  . Angina   . Anxiety    "just before major surgery"  . Arthritis   . Asthma    childhood  . BPH associated with nocturia   . Coronary artery disease   . Hemorrhagic stroke ([place]) 09/2008   S/P fall  . Hyperlipidemia   . Hypertension   . Inferior MI ([place]) 10/2011  . Kidney stone   . Stroke ([place])   . Type II diabetes mellitus ([place])      PAST SURGICAL HISTORY:   Past Surgical History:  Procedure Laterality Date  . APPENDECTOMY  1947  . CARDIAC CATHETERIZATION  10/2011  . CORONARY ANGIOPLASTY WITH STENT PLACEMENT  2009   "2; made total of 6"  . CORONARY ARTERY BYPASS GRAFT  10/24/2011   Procedure: CORONARY ARTERY BYPASS GRAFTING (CABG);  Surgeon: Ivin Poot, MD;  Location: [facility];  Service: Open Heart Surgery;  Laterality: N/A;  Coronary Artery Bypass Graft times four using the left internal mammary artery and the right and left greater saphenous veins harvested endoscopically.  transesophageal Echocardiogram  . INTERTROCHANTERIC HIP FRACTURE SURGERY  05/1991   "pin, 2 screws, plate"; right  . LITHOTRIPSY  ~ 04/2011  . TONSILLECTOMY  1944  . VASECTOMY  ~ 1970     SOCIAL HISTORY:   Social History   Tobacco  Use  . Smoking status: Former Smoker    Packs/day: 1.00    Years: 24.00    Pack years: 24.00    Types: Cigarettes    Quit date: 06/09/1970    Years since quitting: 48.8  . Smokeless tobacco: Never Used  Substance Use Topics  . Alcohol use: Yes    Alcohol/week: 28.0 standard drinks    Types: 28 Shots of liquor per week     FAMILY HISTORY:   Family History  Problem Relation Age of Onset  . Breast cancer Sister      DRUG ALLERGIES:   Allergies  Allergen Reactions  . Levofloxacin Rash  . Aspirin     Hemorrhagic Stroke  . Bee Venom     Other reaction(s): Other (See Comments) fever  . Chlorpheniramine     Other reaction(s): Other (See Comments)  . Decongestant [Pseudoephedrine Hcl]     "don't remember"  . Simvastatin     Other reaction(s): Muscle Pain  . Zolpidem     Other reaction(s): Unknown "went bananas"  . Contrast Media [Iodinated Diagnostic Agents] Rash    MEDICATIONS AT HOME:   Prior to Admission medications   Medication Sig Start Date End Date Taking? Authorizing Provider  acetaminophen (TYLENOL) 500 MG tablet Take by mouth.    [provider]  amoxicillin (AMOXIL) 500  MG capsule Take 500 mg by mouth 3 (three) times daily. Take 4 capsules one hour prior to dental procedure    [provider]  atenolol (TENORMIN) 50 MG tablet Take 50 mg by mouth daily.  10/02/11   [provider]  BIOTIN 5000 PO Take 1 tablet by mouth daily.    [provider]  Coenzyme Q10 (COQ10) 50 MG CAPS Take 50 mg by mouth daily.     [provider]  diazepam (VALIUM) 2 MG tablet Take by mouth. 08/07/16   [provider]  diphenhydrAMINE (BENADRYL) 25 mg capsule Take 1 capsule (25 mg total) by mouth at bedtime as needed for sleep. 10/28/11 11/07/11  Barrett, Erin R, PA-C  ELIQUIS 5 MG TABS tablet Take by mouth 2 (two) times daily.  06/10/17   [provider]  finasteride (PROSCAR) 5 MG tablet Take 5 mg by mouth daily.    [provider]  fluticasone (FLONASE) 50 MCG/ACT nasal spray USE 2 SPRAYS IN EACH NOSTRIL EVERY DAY 04/21/16   [provider]  Fluticasone-Salmeterol (ADVAIR) 250-50 MCG/DOSE AEPB Inhale 1 puff into the lungs 2 (two) times daily. 11/01/11   Barrett, Erin R, PA-C  furosemide (LASIX) 20 MG tablet TAKE 1 TABLET BY MOUTH DAILY 08/03/17   [provider]  latanoprost (XALATAN) 0.005 % ophthalmic solution PLACE ONE DROP INTO EACH EYE AT BEDTIME 07/21/17   [provider]  LUTEIN PO Take 1 tablet by mouth daily.    [provider]  metFORMIN (GLUCOPHAGE) 500 MG tablet Take 500 mg by mouth daily with breakfast.    [provider]  metoprolol succinate (TOPROL-XL) 25 MG 24 hr tablet  06/10/17   [provider]  mirtazapine (REMERON) 15 MG tablet Take by mouth. 03/13/17 03/13/18  [provider]  montelukast (SINGULAIR) 10 MG tablet  06/19/17   [provider]  Multiple Vitamin (MULITIVITAMIN WITH MINERALS) TABS Take 1 tablet by mouth daily.    [provider]  Omega-3 Fatty Acids (FISH OIL) 1200 MG CAPS Take 2 capsules by mouth daily.    [provider]  Rosuvastatin Calcium (CRESTOR PO) Take 2.5 mg by mouth every Monday, Wednesday, and Friday.    [provider]  spironolactone (ALDACTONE) 25 MG tablet Take 12.5 mg by mouth.  09/19/16 08/26/18  [provider]    REVIEW OF SYSTEMS:  Review of Systems  Constitutional: Negative for chills, fever, malaise/fatigue and weight loss.  HENT: Negative for ear pain, hearing loss and tinnitus.   Eyes: Negative for blurred vision, double vision, pain and redness.  Respiratory: Negative for cough, hemoptysis and shortness of breath.   Cardiovascular: Negative for chest pain, palpitations, orthopnea and leg swelling.  Gastrointestinal: Negative for abdominal pain, constipation, diarrhea, nausea and vomiting.  Genitourinary: Negative for dysuria, frequency and  hematuria.  Musculoskeletal: Positive for falls and joint pain (Right hip). Negative for back pain and neck pain.  Skin:       No acne, rash, or lesions  Neurological: Negative for dizziness, tremors, focal weakness and weakness.  Endo/Heme/Allergies: Negative for polydipsia. Does not bruise/bleed easily.  Psychiatric/Behavioral: Negative for depression. The patient is not nervous/anxious and does not have insomnia.      VITAL SIGNS:   Vitals:   03/28/19 2101 03/28/19 2103  BP: 138/74   Pulse:  82  Resp: 18 15  SpO2:  97%  Weight:  88.5 kg  Height:  6\' 3"  (1.905 m)   Wt Readings from  Last 3 Encounters:  03/28/19 88.5 kg  08/26/18 85.3 kg  08/13/17 89.4 kg    PHYSICAL EXAMINATION:  Physical Exam  Vitals reviewed. Constitutional: He is oriented to person, place, and time. He appears well-developed and well-nourished. No distress.  HENT:  Head: Normocephalic and atraumatic.  Mouth/Throat: Oropharynx is clear and moist.  Eyes: Pupils are equal, round, and reactive to light. Conjunctivae and EOM are normal. No scleral icterus.  Neck: Normal range of motion. Neck supple. No JVD present. No thyromegaly present.  Cardiovascular: Normal rate, regular rhythm and intact distal pulses. Exam reveals no gallop and no friction rub.  No murmur heard. Respiratory: Effort normal and breath sounds normal. No respiratory distress. He has no wheezes. He has no rales.  GI: Soft. Bowel sounds are normal. He exhibits no distension. There is no abdominal tenderness.  Musculoskeletal: Normal range of motion.        General: Tenderness (right hip) present. No edema.     Comments: No arthritis, no gout  Lymphadenopathy:    He has no cervical adenopathy.  Neurological: He is alert and oriented to person, place, and time. No cranial nerve deficit.  No dysarthria, no aphasia  Skin: Skin is warm and dry. No rash noted. No erythema.  Psychiatric: He has a normal mood and affect. His behavior is normal.  Judgment and thought content normal.    LABORATORY PANEL:   CBC Recent Labs  Lab 03/28/19 2106  WBC 10.3  HGB 12.6*  HCT 38.4*  PLT 123*   ------------------------------------------------------------------------------------------------------------------  Chemistries  Recent Labs  Lab 03/28/19 2106  NA 139  K 4.1  CL 106  CO2 20*  GLUCOSE 128*  BUN 42*  CREATININE 1.43*  CALCIUM 9.2   ------------------------------------------------------------------------------------------------------------------  Cardiac Enzymes No results for input(s): TROPONINI in the last 168 hours. ------------------------------------------------------------------------------------------------------------------  RADIOLOGY:  Dg Hip Unilat  With Pelvis 2-3 Views Right  Result Date: 03/28/2019 CLINICAL DATA:  Fall on right hip EXAM: DG HIP (WITH OR WITHOUT PELVIS) 2-3V RIGHT COMPARISON:  None. FINDINGS: The patient has had a prior plate and nail fixation of the right femur. There appears to be acute comminuted fracture of the greater trochanter which is mildly impacted. Healed fracture deformity of the lesser trochanter and femoral head neck junction is seen. There is moderate bilateral hip osteoarthritis with superior joint space loss and marginal osteophyte formation. Diffuse osteopenia. Vascular stent seen within the lower abdomen. IMPRESSION: acute comminuted mildly impacted fracture of the greater trochanter. Electronically Signed   By: Prudencio Pair M.D.   On: 03/28/2019 21:55    EKG:   Orders placed or performed during the hospital encounter of 03/28/19  . EKG 12-Lead  . EKG 12-Lead  . EKG 12-Lead  . EKG 12-Lead  . EKG 12-Lead  . EKG 12-Lead  . ED EKG  . ED EKG    IMPRESSION AND PLAN:  Principal Problem:   Hip fracture ([place]) -right hip fracture after fall, orthopedic surgery consult and further recommendations for treatment of this problem Active Problems:   DM (diabetes mellitus)  ([place]) -sliding scale insulin   CAD in native artery -continue home meds except for Eliquis.  Patient states that he is on anticoagulation due to his heart history, and history of A. fib.  His last dose was 03/28/2019 in the morning.  We will have him on a heparin drip here to bridge him until he Eliquis can wash out of his system.   HTN (hypertension) -home dose antihypertensives  Hyperlipidemia -home dose antilipid  Chart review performed and case discussed with ED provider. Labs, imaging and/or ECG reviewed by provider and discussed with patient/family. Management plans discussed with the patient and/or family.  COVID-19 status: Pending  DVT PROPHYLAXIS: Mechanical only  GI PROPHYLAXIS:  None  ADMISSION STATUS: Inpatient     CODE STATUS: Full  TOTAL TIME TAKING CARE OF THIS PATIENT: 45 minutes.   This patient was evaluated in the context of the global COVID-19 pandemic, which necessitated consideration that the patient might be at risk for infection with the SARS-CoV-2 virus that causes COVID-19. Institutional protocols and algorithms that pertain to the evaluation of patients at risk for COVID-19 are in a state of rapid change based on information released by regulatory bodies including the CDC and federal and state organizations. These policies and algorithms were followed to the best of this provider's knowledge to date during the patient's care at this facility.  Ethlyn Daniels 03/28/2019, 10:17 PM  [facility]  Office  [phone]  CC: Primary care physician; Derinda Late, MD  Note:  This document was prepared using Dragon voice recognition software and may include unintentional dictation errors.

## 2019-03-28 NOTE — ED Provider Notes (Signed)
[facility] Emergency Department Provider Note  ____________________________________________   First MD Initiated Contact with Patient 03/28/19 2059     (approximate)  I have reviewed the triage vital signs and the nursing notes.  History  Chief Complaint Fall    HPI Nathaniel Woods is a 85 y.o. male with history as below who presents via EMS for fall. Patient states he was bending over to pick something up when he lost is balance and fell. Fell onto his R hip with immediate resultant pain. Unable to bear weight. Pain is sharp and severe in intensity, locates to right hip and radiates some down the proximal femur. No numbness or tingling. He denies any head injury or LOC. No preceding presyncopal symptoms, no chest pain, lightheadedness, dizziness. He is on anticoagulation.    Past Medical Hx Past Medical History:  Diagnosis Date  . Angina   . Anxiety    "just before major surgery"  . Arthritis   . Asthma    childhood  . BPH associated with nocturia   . Coronary artery disease   . Hemorrhagic stroke ([place]) 09/2008   S/P fall  . Hyperlipidemia   . Hypertension   . Inferior MI ([place]) 10/2011  . Kidney stone   . Stroke ([place])   . Type II diabetes mellitus ([place])     Problem List Patient Active Problem List   Diagnosis Date Noted  . Carotid stenosis 08/14/2017  . DM (diabetes mellitus) ([place]) 11/02/2011  . CAD in native artery 11/02/2011  . HTN (hypertension) 11/02/2011  . Abdominal aortic aneurysm ([place]) 11/02/2011  . H/O Ventricular Fibrillation 11/02/2011  . 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 Hemorrhagic stroke ([place])   . Hyperlipidemia     Past Surgical Hx Past Surgical History:  Procedure Laterality Date  . APPENDECTOMY  1947  . CARDIAC CATHETERIZATION  10/2011  . CORONARY ANGIOPLASTY WITH STENT PLACEMENT  2009   "2; made total of 6"  . CORONARY ARTERY BYPASS GRAFT  10/24/2011   Procedure: CORONARY ARTERY BYPASS GRAFTING (CABG);  Surgeon: Ivin Poot, MD;  Location: [facility];  Service: Open Heart Surgery;  Laterality: N/A;  Coronary Artery Bypass Graft times four using the left internal mammary artery and the right and left greater saphenous veins harvested endoscopically.  transesophageal Echocardiogram  . INTERTROCHANTERIC HIP FRACTURE SURGERY  05/1991   "pin, 2 screws, plate"; right  . LITHOTRIPSY  ~ 04/2011  . TONSILLECTOMY  1944  . VASECTOMY  ~ 1970    Medications Prior to Admission medications   Medication Sig Start Date End Date Taking? Authorizing Provider  acetaminophen (TYLENOL) 500 MG tablet Take by mouth.    [provider]  amoxicillin (AMOXIL) 500 MG capsule Take 500 mg by mouth 3 (three) times daily. Take 4 capsules one hour prior to dental procedure    [provider]  atenolol (TENORMIN) 50 MG tablet Take 50 mg by mouth daily.  10/02/11   [provider]  BIOTIN 5000 PO Take 1 tablet by mouth daily.    [provider]  Coenzyme Q10 (COQ10) 50 MG CAPS Take 50 mg by mouth daily.     [provider]  diazepam (VALIUM) 2 MG tablet Take by mouth. 08/07/16   [provider]  diphenhydrAMINE (BENADRYL) 25 mg capsule Take 1 capsule (25 mg total) by mouth at bedtime as needed for sleep. 10/28/11 11/07/11  Barrett, Erin R, PA-C  ELIQUIS 5 MG TABS tablet Take by  mouth 2 (two) times daily.  06/10/17   [provider]  finasteride (PROSCAR) 5 MG tablet Take 5 mg by mouth daily.    [provider]  fluticasone (FLONASE) 50 MCG/ACT nasal spray USE 2 SPRAYS IN EACH NOSTRIL EVERY DAY 04/21/16   [provider]  Fluticasone-Salmeterol (ADVAIR) 250-50 MCG/DOSE AEPB Inhale 1 puff into the lungs 2 (two) times daily. 11/01/11   Barrett, Erin R, PA-C  furosemide (LASIX) 20 MG tablet TAKE 1 TABLET BY MOUTH DAILY 08/03/17   [provider]  latanoprost (XALATAN) 0.005 % ophthalmic solution PLACE ONE DROP INTO EACH EYE AT BEDTIME 07/21/17   [provider]  LUTEIN PO Take 1 tablet by mouth daily.    [provider]  metFORMIN (GLUCOPHAGE) 500 MG tablet Take 500 mg by mouth daily with breakfast.    [provider]  metoprolol succinate (TOPROL-XL) 25 MG 24 hr tablet  06/10/17   [provider]  mirtazapine (REMERON) 15 MG tablet Take by mouth. 03/13/17 03/13/18  [provider]  montelukast (SINGULAIR) 10 MG tablet  06/19/17   [provider]  Multiple Vitamin (MULITIVITAMIN WITH MINERALS) TABS Take 1 tablet by mouth daily.    [provider]  Omega-3 Fatty Acids (FISH OIL) 1200 MG CAPS Take 2 capsules by mouth daily.    [provider]  Rosuvastatin Calcium (CRESTOR PO) Take 2.5 mg by mouth every Monday, Wednesday, and Friday.    [provider]  spironolactone (ALDACTONE) 25 MG tablet Take 12.5 mg by mouth.  09/19/16 08/26/18  [provider]    Allergies Levofloxacin, Aspirin, Bee venom, Chlorpheniramine, Decongestant [pseudoephedrine hcl], Simvastatin, Zolpidem, and Contrast media [iodinated diagnostic agents]  Family Hx Family History  Problem Relation Age of Onset  . Breast cancer Sister     Social Hx Social History   Tobacco Use  . Smoking status: Former Smoker    Packs/day: 1.00    Years: 24.00    Pack years: 24.00    Types: Cigarettes    Quit date: 06/09/1970    Years since quitting: 48.8  . Smokeless tobacco: Never Used  Substance Use Topics  . Alcohol use: Yes    Alcohol/week: 28.0 standard drinks    Types: 28 Shots of liquor per week  . Drug use: No     Review of Systems  Constitutional: Negative for fever, chills. Eyes: Negative for visual changes. ENT: Negative for sore throat. Cardiovascular: Negative for chest pain. Respiratory: Negative for shortness of breath. Gastrointestinal: Negative for nausea, vomiting.  Genitourinary: Negative for dysuria. Musculoskeletal: + hip pain Skin: Negative for rash. Neurological: Negative for  for headaches.   Physical Exam  Vital Signs: ED Triage Vitals  Enc Vitals Group     BP 03/28/19 2101 138/74     Pulse Rate 03/28/19 2103 82     Resp 03/28/19 2101 18     Temp --      Temp src --      SpO2 03/28/19 2103 97 %     Weight 03/28/19 2103 195 lb (88.5 kg)     Height 03/28/19 2103 6\' 3"  (1.905 m)     Head Circumference --      Peak Flow --      Pain Score 03/28/19 2102 0     Pain Loc --      Pain Edu? --      Excl. in [place]? --     Constitutional: Hard of hearing. Head: Normocephalic. Atraumatic.  Eyes: Conjunctivae clear. Sclera anicteric. Nose: No congestion. No rhinorrhea. Mouth/Throat: Mucous membranes are moist.  Neck: No stridor.  No midline CS tenderness Cardiovascular: Normal rate, regular rhythm. Extremities well perfused, 2+ symmetric DP pulses. Pacemaker in upper chest.  Chest: NT, stable, no crepitance. Respiratory: Normal respiratory effort.  Lungs CTAB. Gastrointestinal: Soft. Non-tender. Non-distended. Pelvis: R sided tenderness, ROM deferred 2/2 pain. L side without tender, able to range hip w/o issue  Musculoskeletal: RLE slightly shortened compared to L. Neurologic:  Normal speech and language. No gross focal neurologic deficits are appreciated. Able to wiggle toes and dorsi and plantar flex bilaterally Skin: Skin is warm, dry and intact. No rash noted. Psychiatric: Mood and affect are appropriate for situation.  EKG  Personally reviewed.   Rate: 80s Rhythm: paced Axis: LAD Intervals: abnormal due to paced rhythm Paced, RBBB No STEMI    Radiology  XR: IMPRESSION:  Acute comminuted mildly impacted fracture of the greater trochanter.    Procedures  Procedure(s) performed (including critical care):  Procedures   Initial Impression / Assessment and Plan / ED Course  85 y.o. male who presents to the ED for fall with hip pain, concerning for fracture. As above.  XR reveals greater trochanter fracture. Closed and distally NV in  tact. Cr slightly bumped from prior. Getting fluids. Will admit to hospitalist and make orthopedics aware.   Final Clinical Impression(s) / ED Diagnosis  Final diagnoses:  Fall, initial encounter  Closed fracture of right hip, initial encounter [facility])       Note:  This document was prepared using Dragon voice recognition software and may include unintentional dictation errors.   Lilia Pro., MD 03/29/19 [phone]

## 2019-03-28 NOTE — ED Notes (Signed)
ED TO INPATIENT HANDOFF REPORT  ED Nurse Name and Phone #: Anson Crofts Name/Age/Gender Nathaniel Woods 85 y.o. male Room/Bed: ED26A/ED26A  Code Status   Code Status: Not on file  Home/SNF/Other Home Patient oriented to: self, place, time and situation Is this baseline? Yes   Triage Complete: Triage complete  Chief Complaint rt hip pain ems  Triage Note Pt presents from home via acems with c/o mechanical fall. Pt states he was bending over and tripped and fell on R hip. Shortening noted by ems, but no rotation. Pt has pins and plates already in Right hip. Pt c/o pain when bearing weight on affected extremity. Pt has pacemaker. Pt denies loc with fall.   Allergies Allergies  Allergen Reactions  . Levofloxacin Rash  . Aspirin Other (See Comments)    Hemorrhagic Stroke  . Bee Venom Other (See Comments)    Reaction: fever  . Chlorpheniramine Other (See Comments)    Reaction: unknown  . Decongestant [Pseudoephedrine Hcl] Other (See Comments)    Reaction: unknown  . Simvastatin Other (See Comments)    Other reaction(s): Muscle Pain  . Zolpidem Other (See Comments)    Other reaction(s): Unknown "went bananas"  . Contrast Media [Iodinated Diagnostic Agents] Rash    Level of Care/Admitting Diagnosis ED Disposition    ED Disposition Condition [facility] Area: [place] [[id number]]  Level of Care: Med-Surg [16]  Covid Evaluation: Asymptomatic Screening Protocol (No Symptoms)  [facility]: [id number]  Admitting Physician: Lance Coon [id number]  Attending Physician: Lance Coon [phone]  Estimated length of stay: past midnight tomorrow  Certification:: I certify this patient will need inpatient services for at least 2 midnights  PT Class (Do Not Modify): Inpatient [101]  PT Acc Code (Do Not Modify): Private [1]       B Medical/Surgery History Past Medical History:  Diagnosis Date  . Angina   . Anxiety    "just  before major surgery"  . Arthritis   . Asthma    childhood  . BPH associated with nocturia   . Coronary artery disease   . Hemorrhagic stroke ([place]) 09/2008   S/P fall  . Hyperlipidemia   . Hypertension   . Inferior MI ([place]) 10/2011  . Kidney stone   . Stroke ([place])   . Type II diabetes mellitus ([place])    Past Surgical History:  Procedure Laterality Date  . APPENDECTOMY  1947  . CARDIAC CATHETERIZATION  10/2011  . CORONARY ANGIOPLASTY WITH STENT PLACEMENT  2009   "2; made total of 6"  . CORONARY ARTERY BYPASS GRAFT  10/24/2011   Procedure: CORONARY ARTERY BYPASS GRAFTING (CABG);  Surgeon: Ivin Poot, MD;  Location: [facility];  Service: Open Heart Surgery;  Laterality: N/A;  Coronary Artery Bypass Graft times four using the left internal mammary artery and the right and left greater saphenous veins harvested endoscopically.  transesophageal Echocardiogram  . INTERTROCHANTERIC HIP FRACTURE SURGERY  05/1991   "pin, 2 screws, plate"; right  . LITHOTRIPSY  ~ 04/2011  . TONSILLECTOMY  1944  . VASECTOMY  ~ 1970     A IV Location/Drains/Wounds Patient Lines/Drains/Airways Status   Active Line/Drains/Airways    Name:   Placement date:   Placement time:   Site:   Days:   Peripheral IV Left Wrist   -    -    Wrist      Peripheral IV Right Wrist   -    -  Wrist      Peripheral IV 01/26/15 Left Antecubital   01/26/15    0816    Antecubital   1522   Peripheral IV 03/28/19 Right Forearm   03/28/19    2120    Forearm   less than 1   NG/OG Tube Right mouth   10/24/11    -    Right mouth   2712   Incision 10/24/11 Leg Left   10/24/11    0940     2712   Incision 10/24/11 Chest Other (Comment)   10/24/11    0940     2712   Incision 10/24/11 Leg Right   10/24/11    1008     2712   Incision 10/24/11 Groin Left   10/24/11    1423     2712          Intake/Output Last 24 hours No intake or output data in the 24 hours ending 03/28/19 2252  Labs/Imaging Results for orders placed or performed  during the hospital encounter of 03/28/19 (from the past 48 hour(s))  Protime-INR     Status: None   Collection Time: 03/28/19  9:06 PM  Result Value Ref Range   Prothrombin Time 14.8 11.4 - 15.2 seconds   INR 1.2 0.8 - 1.2    Comment: (NOTE) INR goal varies based on device and disease states. Performed at [facility], [address] [place], [place] [address]   CBC with Differential     Status: Abnormal   Collection Time: 03/28/19  9:06 PM  Result Value Ref Range   WBC 10.3 4.0 - 10.5 K/uL   RBC 3.62 (L) 4.22 - 5.81 MIL/uL   Hemoglobin 12.6 (L) 13.0 - 17.0 g/dL   HCT 38.4 (L) 39.0 - 52.0 %   MCV 106.1 (H) 80.0 - 100.0 fL   MCH 34.8 (H) 26.0 - 34.0 pg   MCHC 32.8 30.0 - 36.0 g/dL   RDW 13.1 11.5 - 15.5 %   Platelets 123 (L) 150 - 400 K/uL   nRBC 0.0 0.0 - 0.2 %   Neutrophils Relative % 67 %   Neutro Abs 7.1 1.7 - 7.7 K/uL   Lymphocytes Relative 20 %   Lymphs Abs 2.0 0.7 - 4.0 K/uL   Monocytes Relative 9 %   Monocytes Absolute 0.9 0.1 - 1.0 K/uL   Eosinophils Relative 2 %   Eosinophils Absolute 0.2 0.0 - 0.5 K/uL   Basophils Relative 1 %   Basophils Absolute 0.1 0.0 - 0.1 K/uL   Immature Granulocytes 1 %   Abs Immature Granulocytes 0.07 0.00 - 0.07 K/uL    Comment: Performed at [facility], [address] [place], [place] [id number]  Basic metabolic panel     Status: Abnormal   Collection Time: 03/28/19  9:06 PM  Result Value Ref Range   Sodium 139 135 - 145 mmol/L   Potassium 4.1 3.5 - 5.1 mmol/L   Chloride 106 98 - 111 mmol/L   CO2 20 (L) 22 - 32 mmol/L   Glucose, Bld 128 (H) 70 - 99 mg/dL   BUN 42 (H) 8 - 23 mg/dL   Creatinine, Ser 1.43 (H) 0.61 - 1.24 mg/dL   Calcium 9.2 8.9 - 10.3 mg/dL   GFR calc non Af Amer 43 (L) >60 mL/min   GFR calc Af Amer 50 (L) >60 mL/min   Anion gap 13 5 - 15    Comment: Performed at [facility], [address], [place], [place] [address]   Dg Chest Port 1 View  Result Date: 03/28/2019 CLINICAL  DATA:  Preop. Fall, hip fracture. EXAM: PORTABLE CHEST 1 VIEW COMPARISON:  Chest radiograph 11/14/2011 FINDINGS: Low lung volumes. Multi lead left-sided pacemaker in place. Post median sternotomy and CABG. Upper normal heart size with mild aortic tortuosity. Aortic atherosclerosis. Central peribronchial thickening. Streaky left lung base opacities favor atelectasis. No confluent airspace disease. No large pleural effusion or pneumothorax. No acute osseous abnormalities are seen. IMPRESSION: 1. Low lung volumes with central peribronchial thickening. 2. Post CABG with left-sided pacemaker in place. 3.  Aortic Atherosclerosis (ICD10-I70.0). Electronically Signed   By: Keith Rake M.D.   On: 03/28/2019 22:31   Dg Hip Unilat  With Pelvis 2-3 Views Right  Result Date: 03/28/2019 CLINICAL DATA:  Fall on right hip EXAM: DG HIP (WITH OR WITHOUT PELVIS) 2-3V RIGHT COMPARISON:  None. FINDINGS: The patient has had a prior plate and nail fixation of the right femur. There appears to be acute comminuted fracture of the greater trochanter which is mildly impacted. Healed fracture deformity of the lesser trochanter and femoral head neck junction is seen. There is moderate bilateral hip osteoarthritis with superior joint space loss and marginal osteophyte formation. Diffuse osteopenia. Vascular stent seen within the lower abdomen. IMPRESSION: acute comminuted mildly impacted fracture of the greater trochanter. Electronically Signed   By: Prudencio Pair M.D.   On: 03/28/2019 21:55    Pending Labs Unresulted Labs (From admission, onward)    Start     Ordered   03/28/19 2205  SARS Coronavirus 2 by RT PCR (hospital order, performed in [facility] hospital lab) Nasopharyngeal Nasopharyngeal Swab  (Symptomatic/High Risk of Exposure/Tier 1 Patients Labs with Precautions)  Once,   STAT    Question Answer Comment  Is this test for diagnosis or screening Diagnosis of ill patient   Symptomatic for COVID-19 as defined by CDC No    Hospitalized for COVID-19 No   Admitted to ICU for COVID-19 No   Previously tested for COVID-19 No   Resident in a congregate (group) care setting No   Employed in healthcare setting No      03/28/19 2204   03/28/19 2156  Urinalysis, Complete w Microscopic  ONCE - STAT,   STAT     03/28/19 2155   Signed and Held  Hemoglobin A1c  Once,   R    Comments: To assess prior glycemic control    Signed and Held   Signed and Held  Basic metabolic panel  Tomorrow morning,   R     Signed and Held   Signed and Held  CBC  Tomorrow morning,   R     Signed and Held          Vitals/Pain Today's Vitals   03/28/19 2101 03/28/19 2102 03/28/19 2103  BP: 138/74    Pulse:   82  Resp: 18  15  SpO2:   97%  Weight:   88.5 kg  Height:   6\' 3"  (1.905 m)  PainSc:  0-No pain     Isolation Precautions No active isolations  Medications Medications  sodium chloride 0.9 % bolus 500 mL (has no administration in time range)    Mobility walks with device High fall risk   Focused Assessments orthopedic   R Recommendations: See Admitting Provider Note  Report given to:   Additional Notes:

## 2019-03-29 LAB — BASIC METABOLIC PANEL
Anion gap: 7 (ref 5–15)
BUN: 36 mg/dL — ABNORMAL HIGH (ref 8–23)
CO2: 26 mmol/L (ref 22–32)
Calcium: 8.7 mg/dL — ABNORMAL LOW (ref 8.9–10.3)
Chloride: 107 mmol/L (ref 98–111)
Creatinine, Ser: 1.31 mg/dL — ABNORMAL HIGH (ref 0.61–1.24)
GFR calc Af Amer: 56 mL/min — ABNORMAL LOW (ref >60)
GFR calc non Af Amer: 48 mL/min — ABNORMAL LOW (ref >60)
Glucose, Bld: 122 mg/dL — ABNORMAL HIGH (ref 70–99)
Potassium: 4.2 mmol/L (ref 3.5–5.1)
Sodium: 140 mmol/L (ref 135–145)

## 2019-03-29 LAB — GLUCOSE, CAPILLARY
Glucose-Capillary: 115 mg/dL — ABNORMAL HIGH (ref 70–99)
Glucose-Capillary: 111 mg/dL — ABNORMAL HIGH (ref 70–99)
Glucose-Capillary: 239 mg/dL — ABNORMAL HIGH (ref 70–99)
Glucose-Capillary: 163 mg/dL — ABNORMAL HIGH (ref 70–99)
Glucose-Capillary: 135 mg/dL — ABNORMAL HIGH (ref 70–99)
Glucose-Capillary: 169 mg/dL — ABNORMAL HIGH (ref 70–99)

## 2019-03-29 LAB — SARS CORONAVIRUS 2 BY RT PCR (HOSPITAL ORDER, PERFORMED IN ~~LOC~~ HOSPITAL LAB): SARS Coronavirus 2: NEGATIVE

## 2019-03-29 LAB — CBC
HCT: 34.1 % — ABNORMAL LOW (ref 39.0–52.0)
Hemoglobin: 11.1 g/dL — ABNORMAL LOW (ref 13.0–17.0)
MCH: 34.3 pg — ABNORMAL HIGH (ref 26.0–34.0)
MCHC: 32.6 g/dL (ref 30.0–36.0)
MCV: 105.2 fL — ABNORMAL HIGH (ref 80.0–100.0)
Platelets: 101 10*3/uL — ABNORMAL LOW (ref 150–400)
RBC: 3.24 MIL/uL — ABNORMAL LOW (ref 4.22–5.81)
RDW: 12.9 % (ref 11.5–15.5)
WBC: 9.9 10*3/uL (ref 4.0–10.5)
nRBC: 0 % (ref 0.0–0.2)

## 2019-03-29 MED ORDER — INSULIN ASPART 100 UNIT/ML ~~LOC~~ SOLN: 0.0000 [IU] | Freq: Four times a day (QID) | SUBCUTANEOUS | Status: DC

## 2019-03-29 MED ORDER — ACETAMINOPHEN 325 MG PO TABS
650.0000 mg | ORAL_TABLET | Freq: Four times a day (QID) | ORAL | Status: DC | PRN
  Administered 2019-03-29: 14:00:00 650 mg via ORAL
  Filled 2019-03-29: qty 2

## 2019-03-29 MED ORDER — OXYCODONE HCL 5 MG PO TABS
5.0000 mg | ORAL_TABLET | ORAL | Status: DC | PRN
  Administered 2019-03-29 – 2019-03-30 (×3): 5 mg via ORAL
  Filled 2019-03-29 (×3): qty 1

## 2019-03-29 MED ORDER — MONTELUKAST SODIUM 10 MG PO TABS
10.0000 mg | ORAL_TABLET | Freq: Every day | ORAL | Status: DC
  Administered 2019-03-29: 10 mg via ORAL
  Filled 2019-03-29: qty 1

## 2019-03-29 MED ORDER — METOPROLOL SUCCINATE ER 50 MG PO TB24
50.0000 mg | ORAL_TABLET | Freq: Every day | ORAL | Status: DC
  Filled 2019-03-29: qty 1

## 2019-03-29 MED ORDER — CYCLOBENZAPRINE HCL 10 MG PO TABS
10.0000 mg | ORAL_TABLET | Freq: Three times a day (TID) | ORAL | Status: DC | PRN
  Administered 2019-03-29: 11:00:00 10 mg via ORAL
  Filled 2019-03-29: qty 1

## 2019-03-29 MED ORDER — MIRTAZAPINE 15 MG PO TABS
15.0000 mg | ORAL_TABLET | Freq: Every day | ORAL | Status: DC
  Administered 2019-03-29: 15 mg via ORAL
  Filled 2019-03-29: qty 1

## 2019-03-29 MED ORDER — FLUTICASONE PROPIONATE 50 MCG/ACT NA SUSP
2.0000 | Freq: Every day | NASAL | Status: DC
  Administered 2019-03-30: 09:00:00 2 via NASAL
  Filled 2019-03-29: qty 16

## 2019-03-29 MED ORDER — LATANOPROST 0.005 % OP SOLN
1.0000 [drp] | Freq: Every day | OPHTHALMIC | Status: DC
  Administered 2019-03-29: 1 [drp] via OPHTHALMIC
  Filled 2019-03-29: qty 2.5

## 2019-03-29 MED ORDER — ONDANSETRON HCL 4 MG PO TABS: 4.0000 mg | ORAL_TABLET | Freq: Four times a day (QID) | ORAL | Status: DC | PRN

## 2019-03-29 MED ORDER — ONDANSETRON HCL 4 MG/2ML IJ SOLN: 4.0000 mg | Freq: Four times a day (QID) | INTRAMUSCULAR | Status: DC | PRN

## 2019-03-29 MED ORDER — DIAZEPAM 2 MG PO TABS
2.0000 mg | ORAL_TABLET | Freq: Every evening | ORAL | Status: DC | PRN
  Administered 2019-03-29: 2 mg via ORAL
  Filled 2019-03-29: qty 1

## 2019-03-29 MED ORDER — ROSUVASTATIN CALCIUM 5 MG PO TABS
5.0000 mg | ORAL_TABLET | ORAL | Status: DC
  Administered 2019-03-30: 11:00:00 5 mg via ORAL
  Filled 2019-03-29: qty 1

## 2019-03-29 MED ORDER — ACETAMINOPHEN 650 MG RE SUPP: 650.0000 mg | Freq: Four times a day (QID) | RECTAL | Status: DC | PRN

## 2019-03-29 MED ORDER — FINASTERIDE 5 MG PO TABS
5.0000 mg | ORAL_TABLET | Freq: Every day | ORAL | Status: DC
  Administered 2019-03-30: 5 mg via ORAL
  Filled 2019-03-29: qty 1

## 2019-03-29 MED ORDER — APIXABAN 5 MG PO TABS
5.0000 mg | ORAL_TABLET | Freq: Two times a day (BID) | ORAL | Status: DC
  Administered 2019-03-29 – 2019-03-30 (×2): 5 mg via ORAL
  Filled 2019-03-29 (×2): qty 1

## 2019-03-29 MED ORDER — METFORMIN HCL 500 MG PO TABS
500.0000 mg | ORAL_TABLET | Freq: Every day | ORAL | Status: DC
  Administered 2019-03-30: 09:00:00 500 mg via ORAL
  Filled 2019-03-29: qty 1

## 2019-03-29 MED ORDER — SPIRONOLACTONE 25 MG PO TABS
12.5000 mg | ORAL_TABLET | Freq: Every day | ORAL | Status: DC
  Administered 2019-03-30: 09:00:00 12.5 mg via ORAL
  Filled 2019-03-29 (×2): qty 0.5
  Filled 2019-03-29: qty 1

## 2019-03-29 NOTE — Evaluation (Signed)
Physical Therapy Evaluation Patient Details Name: Nathaniel Woods MRN: [id number] DOB: Oct 25, 1928 Today's Date: 03/29/2019   History of Present Illness  Pt is an 85 y.o M with R hip fracture (comminuted greater trochanter fx) after a fall at home on 10/19. Per orthopedic consult, plan for pt to be WBAT for 6 weeks and avoid hip abduction exercises. Of note, pt with prior R hip fx requiring ORIF approx 20 years ago. Pt was previously active within the community (without use of AD) and using a RW only for ambulation at night for trips to bathroom.  Clinical Impression  Pt is a pleasant 85 year old M who was admitted for R community greater trochanter fx after a fall at his home on 10/19. Pt performs bed mobility and transfers requiring mod/max A. Pt performs supine>sit requiring mod A for trunk and RLE management; pt able to assist with scooting to edge of bed prior to initiating transfer. Pt performs 2 attempts of sit>stand transfer requiring max A for liftoff and balance, and cuing prior for setup; once up requiring extra time and verbal/tactile cuing for hip and knee extension to achieve upright standing; pt at times becoming agitated secondary to pain and difficulty with mobility. Pt demonstrates deficits with balance, strength and range of motion requiring continued skilled Pt intervention.     Follow Up Recommendations SNF    Equipment Recommendations  Rolling walker with 5" wheels    Recommendations for Other Services       Precautions / Restrictions Precautions Precautions: Fall Restrictions Weight Bearing Restrictions: Yes RLE Weight Bearing: Weight bearing as tolerated Other Position/Activity Restrictions: avoiding hip abduction exercises      Mobility  Bed Mobility Overal bed mobility: Needs Assistance Bed Mobility: Supine to Sit     Supine to sit: Mod assist     General bed mobility comments: for assistance to upright trunk and for RLE management to slide to edge of  bed and during transfer  Transfers Overall transfer level: Needs assistance Equipment used: Rolling walker (2 wheeled) Transfers: Sit to/from Stand Sit to Stand: Max assist         General transfer comment: With bed elevated, cuing provided for safe hand placement and to utilize momentum strategy and scoot to edge of bed for decreased difficulty; requiring max A for liftoff and assist with hip extension. Once standing, cuing to increase hip/knee extension to achieve upright, with pt becoming visibly frustrated yelling, "Push me forward!!"  Ambulation/Gait Ambulation/Gait assistance: (Deferred gait secondary for safety)           General Gait Details: Deferred  Stairs            Wheelchair Mobility    Modified Rankin (Stroke Patients Only)       Balance Overall balance assessment: Needs assistance Sitting-balance support: Feet supported;No upper extremity supported Sitting balance-Leahy Scale: Good Sitting balance - Comments: Able to sit edge of bed steady with feet supported, and scoot forward/backward using UEs   Standing balance support: Bilateral upper extremity supported Standing balance-Leahy Scale: Poor Standing balance comment: Pt visibly anxious and heavily reliant on UEs with support of RW while standing, requiring max A for liftoff and balance during STS                             Pertinent Vitals/Pain Pain Assessment: 0-10 Pain Score: 0-No pain Pain Location: 0 at rest, reports generally 8/10 with mobility Pain Descriptors / Indicators:  Aching;Guarding;Grimacing Pain Intervention(s): Limited activity within patient's tolerance;Repositioned;Monitored during session    [place] expects to be discharged to:: Private residence Living Arrangements: Spouse/significant other Available Help at Discharge: Family;Available PRN/intermittently Type of Home: House Home Access: Stairs to enter   [employer] of Steps:  1/2" threshold; son reports home is handicap accessible Home Layout: Two level;Able to live on main level with bedroom/bathroom Home Equipment: Walker - 2 wheels;Grab bars - tub/shower;Bedside commode;Cane - single point      Prior Function Level of Independence: Independent with assistive device(s)         Comments: Reportedly uses RW only at night for trips to bathroom; otherwise active w/community ambulation and visits community fitness center     Hand Dominance        Extremity/Trunk Assessment   Upper Extremity Assessment Upper Extremity Assessment: Overall WFL for tasks assessed    Lower Extremity Assessment Lower Extremity Assessment: LLE deficits/detail;RLE deficits/detail RLE Deficits / Details: Able to perform AAROM hip/knee flexion RLE: Unable to fully assess due to pain RLE Sensation: WNL LLE Deficits / Details: Strength grossly 4/5    Cervical / Trunk Assessment Cervical / Trunk Assessment: Normal  Communication   Communication: Receptive difficulties  Cognition Arousal/Alertness: Awake/alert Behavior During Therapy: Anxious;Agitated Overall Cognitive Status: Within Functional Limits for tasks assessed                                 General Comments: Anxious and at times agitated with mobility attempts secondary to pain and difficulty      General Comments      Exercises Total Joint Exercises Ankle Circles/Pumps: AROM;20 reps Quad Sets: Strengthening;Right;5 reps Heel Slides: Right;AAROM;5 reps   Assessment/Plan    PT Assessment Patient needs continued PT services  PT Problem List Decreased strength;Decreased mobility;Decreased safety awareness;Decreased range of motion;Decreased coordination;Decreased activity tolerance;Decreased balance;Decreased knowledge of use of DME;Pain       PT Treatment Interventions DME instruction;Therapeutic exercise;Gait training;Balance training;Stair training;Neuromuscular re-education;Functional  mobility training;Therapeutic activities;Patient/family education    PT Goals (Current goals can be found in the Care Plan section)  Acute Rehab PT Goals Patient Stated Goal: to be able to move better PT Goal Formulation: With patient Time For Goal Achievement: 04/12/19 Potential to Achieve Goals: Good    Frequency 7X/week   Barriers to discharge        Co-evaluation               AM-PAC PT "6 Clicks" Mobility  Outcome Measure Help needed turning from your back to your side while in a flat bed without using bedrails?: A Little Help needed moving from lying on your back to sitting on the side of a flat bed without using bedrails?: A Lot Help needed moving to and from a bed to a chair (including a wheelchair)?: A Lot Help needed standing up from a chair using your arms (e.g., wheelchair or bedside chair)?: A Lot Help needed to walk in hospital room?: Total Help needed climbing 3-5 steps with a railing? : Total 6 Click Score: 11    End of Session Equipment Utilized During Treatment: Gait belt Activity Tolerance: Patient limited by pain Patient left: in bed;with bed alarm set;with call bell/phone within reach;with family/visitor present;with SCD's reapplied Nurse Communication: Mobility status PT Visit Diagnosis: History of falling (Z91.81);Unsteadiness on feet (R26.81);Other abnormalities of gait and mobility (R26.89);Muscle weakness (generalized) (M62.81);Pain Pain - Right/Left: Right Pain - part  of body: Hip    Time: [id number] PT Time Calculation (min) (ACUTE ONLY): 44 min   Charges:   PT Evaluation $PT Eval Moderate Complexity: 1 Mod PT Treatments $Therapeutic Activity: 8-22 mins        Petra Kuba, PT, DPT 03/29/19, 12:07 PM

## 2019-03-29 NOTE — Progress Notes (Signed)
[facility] [place] NAME: Nathaniel Woods    MR#:  [id number]  DATE OF BIRTH:  [birth year]  SUBJECTIVE:  CHIEF COMPLAINT: Patient is resting okay, pain medications are helping with the pain.  Hard of hearing but agreeable to go to rehab center  REVIEW OF SYSTEMS:  CONSTITUTIONAL: No fever, fatigue or weakness.  EYES: No blurred or double vision.  EARS, NOSE, AND THROAT: No tinnitus or ear pain.  RESPIRATORY: No cough, shortness of breath, wheezing or hemoptysis.  CARDIOVASCULAR: No chest pain, orthopnea, edema.  GASTROINTESTINAL: No nausea, vomiting, diarrhea or abdominal pain.  GENITOURINARY: No dysuria, hematuria.  ENDOCRINE: No polyuria, nocturia,  HEMATOLOGY: No anemia, easy bruising or bleeding SKIN: No rash or lesion. MUSCULOSKELETAL: Right hip pain NEUROLOGIC: No tingling, numbness, weakness.  PSYCHIATRY: No anxiety or depression.   DRUG ALLERGIES:   Allergies  Allergen Reactions  . Levofloxacin Rash  . Aspirin Other (See Comments)    Hemorrhagic Stroke  . Bee Venom Other (See Comments)    Reaction: fever  . Chlorpheniramine Other (See Comments)    Reaction: unknown  . Decongestant [Pseudoephedrine Hcl] Other (See Comments)    Reaction: unknown  . Simvastatin Other (See Comments)    Other reaction(s): Muscle Pain  . Zolpidem Other (See Comments)    Other reaction(s): Unknown "went bananas"  . Contrast Media [Iodinated Diagnostic Agents] Rash    VITALS:  Blood pressure (!) 141/76, pulse 88, temperature 97.6 F (36.4 C), temperature source Oral, resp. rate 18, height 6\' 3"  (1.905 m), weight 85.7 kg, SpO2 97 %.  PHYSICAL EXAMINATION:  GENERAL:  85 y.o.-year-old patient lying in the bed with no acute distress.  EYES: Pupils equal, round, reactive to light and accommodation. No scleral icterus. Extraocular muscles intact.  HEENT: Head atraumatic, normocephalic. Oropharynx and nasopharynx clear.  NECK:  Supple, no  jugular venous distention. No thyroid enlargement, no tenderness.  LUNGS: Normal breath sounds bilaterally, no wheezing, rales,rhonchi or crepitation. No use of accessory muscles of respiration.  CARDIOVASCULAR: S1, S2 normal. No murmurs, rubs, or gallops.  ABDOMEN: Soft, nontender, nondistended. Bowel sounds present.  EXTREMITIES: Right hip is tender no pedal edema, cyanosis, or clubbing.  NEUROLOGIC: Cranial nerves II through XII are intact.Sensation intact. Gait not checked.  PSYCHIATRIC: The patient is alert and oriented x 3.  SKIN: No obvious rash, lesion, or ulcer.    LABORATORY PANEL:   CBC Recent Labs  Lab 03/29/19 0512  WBC 9.9  HGB 11.1*  HCT 34.1*  PLT 101*   ------------------------------------------------------------------------------------------------------------------  Chemistries  Recent Labs  Lab 03/29/19 0512  NA 140  K 4.2  CL 107  CO2 26  GLUCOSE 122*  BUN 36*  CREATININE 1.31*  CALCIUM 8.7*   ------------------------------------------------------------------------------------------------------------------  Cardiac Enzymes No results for input(s): TROPONINI in the last 168 hours. ------------------------------------------------------------------------------------------------------------------  RADIOLOGY:  Dg Chest Port 1 View  Result Date: 03/28/2019 CLINICAL DATA:  Preop. Fall, hip fracture. EXAM: PORTABLE CHEST 1 VIEW COMPARISON:  Chest radiograph 11/14/2011 FINDINGS: Low lung volumes. Multi lead left-sided pacemaker in place. Post median sternotomy and CABG. Upper normal heart size with mild aortic tortuosity. Aortic atherosclerosis. Central peribronchial thickening. Streaky left lung base opacities favor atelectasis. No confluent airspace disease. No large pleural effusion or pneumothorax. No acute osseous abnormalities are seen. IMPRESSION: 1. Low lung volumes with central peribronchial thickening. 2. Post CABG with left-sided pacemaker in place.  3.  Aortic Atherosclerosis (ICD10-I70.0). Electronically Signed   By: Keith Rake  M.D.   On: 03/28/2019 22:31   Dg Hip Unilat  With Pelvis 2-3 Views Right  Result Date: 03/28/2019 CLINICAL DATA:  Fall on right hip EXAM: DG HIP (WITH OR WITHOUT PELVIS) 2-3V RIGHT COMPARISON:  None. FINDINGS: The patient has had a prior plate and nail fixation of the right femur. There appears to be acute comminuted fracture of the greater trochanter which is mildly impacted. Healed fracture deformity of the lesser trochanter and femoral head neck junction is seen. There is moderate bilateral hip osteoarthritis with superior joint space loss and marginal osteophyte formation. Diffuse osteopenia. Vascular stent seen within the lower abdomen. IMPRESSION: acute comminuted mildly impacted fracture of the greater trochanter. Electronically Signed   By: Prudencio Pair M.D.   On: 03/28/2019 21:55    EKG:   Orders placed or performed during the hospital encounter of 03/28/19  . EKG 12-Lead  . EKG 12-Lead  . EKG 12-Lead  . EKG 12-Lead  . EKG 12-Lead  . EKG 12-Lead  . ED EKG  . ED EKG    ASSESSMENT AND PLAN:   #Acute right hip fracture from fall Orthopedics was consulted no plans for surgery Pain management as needed PT assessment  #Diabetes mellitus sliding scale insulin and resume Metformin his home dose  #Coronary artery disease continue home medication Eliquis, Cozaar and statin  #Chronic atrial fibrillation rate controlled,-continue anticoagulation with Eliquis Continue metoprolol home medication  #Essential hypertension-continue metoprolol his home medication and Cozaar and titrate as needed  #Hyperlipidemia-statin     All the records are reviewed and case discussed with Care Management/Social Workerr. Management plans discussed with the patient, family and they are in agreement.  CODE STATUS: fc   TOTAL TIME TAKING CARE OF THIS PATIENT: 35 minutes.   POSSIBLE D/C IN 1-2 DAYS, DEPENDING  ON CLINICAL CONDITION.  Note: This dictation was prepared with Dragon dictation along with smaller phrase technology. Any transcriptional errors that result from this process are unintentional.   Nicholes Mango M.D on 03/29/2019 at 4:21 PM  Between 7am to 6pm - Pager - [phone] After 6pm go to [email or web address] - password [email or web address] [facility]  [facility]  Office  [phone]  CC: Primary care physician; Derinda Late, MD

## 2019-03-29 NOTE — Consult Note (Signed)
Reason for Consult: Right hip fracture Referring Physician: Dr. Jearld Adjutant Nathaniel Woods is an 85 y.o. male.  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  He had an ORIF of the right hip about 20 years ago he believes.  He suffered a fall last evening and suffered a fracture around the proximal femur and is being admitted for this.  He is unable to ambulate at present.  He denies prodromal symptoms but does report his balance has been bad.  Past Medical History:  Diagnosis Date  . Angina   . Anxiety    "just before major surgery"  . Arthritis   . Asthma    childhood  . BPH associated with nocturia   . Coronary artery disease   . Hemorrhagic stroke ([place]) 09/2008   S/P fall  . Hyperlipidemia   . Hypertension   . Inferior MI ([place]) 10/2011  . Kidney stone   . Stroke ([place])   . Type II diabetes mellitus ([place])     Past Surgical History:  Procedure Laterality Date  . APPENDECTOMY  1947  . CARDIAC CATHETERIZATION  10/2011  . CORONARY ANGIOPLASTY WITH STENT PLACEMENT  2009   "2; made total of 6"  . CORONARY ARTERY BYPASS GRAFT  10/24/2011   Procedure: CORONARY ARTERY BYPASS GRAFTING (CABG);  Surgeon: Ivin Poot, MD;  Location: [facility];  Service: Open Heart Surgery;  Laterality: N/A;  Coronary Artery Bypass Graft times four using the left internal mammary artery and the right and left greater saphenous veins harvested endoscopically.  transesophageal Echocardiogram  . INTERTROCHANTERIC HIP FRACTURE SURGERY  05/1991   "pin, 2 screws, plate"; right  . LITHOTRIPSY  ~ 04/2011  . TONSILLECTOMY  1944  . VASECTOMY  ~ 1970    Family History  Problem Relation Age of Onset  . Breast cancer Sister     Social History:  reports that he quit smoking about 48 years ago. His smoking use included cigarettes. He has a 24.00 pack-year smoking history. He has never used smokeless tobacco. He reports current alcohol use of about 28.0 standard drinks of  alcohol per week. He reports that he does not use drugs.  Allergies:  Allergies  Allergen Reactions  . Levofloxacin Rash  . Aspirin Other (See Comments)    Hemorrhagic Stroke  . Bee Venom Other (See Comments)    Reaction: fever  . Chlorpheniramine Other (See Comments)    Reaction: unknown  . Decongestant [Pseudoephedrine Hcl] Other (See Comments)    Reaction: unknown  . Simvastatin Other (See Comments)    Other reaction(s): Muscle Pain  . Zolpidem Other (See Comments)    Other reaction(s): Unknown "went bananas"  . Contrast Media [Iodinated Diagnostic Agents] Rash    Medications: I have reviewed the patient's current medications.  Results for orders placed or performed during the hospital encounter of 03/28/19 (from the past 48 hour(s))  Protime-INR     Status: None   Collection Time: 03/28/19  9:06 PM  Result Value Ref Range   Prothrombin Time 14.8 11.4 - 15.2 seconds   INR 1.2 0.8 - 1.2    Comment: (NOTE) INR goal varies based on device and disease states. Performed at [facility], [address] [place], [place] [address]   CBC with Differential     Status: Abnormal   Collection Time: 03/28/19  9:06 PM  Result Value Ref Range   WBC 10.3 4.0 - 10.5  K/uL   RBC 3.62 (L) 4.22 - 5.81 MIL/uL   Hemoglobin 12.6 (L) 13.0 - 17.0 g/dL   HCT 38.4 (L) 39.0 - 52.0 %   MCV 106.1 (H) 80.0 - 100.0 fL   MCH 34.8 (H) 26.0 - 34.0 pg   MCHC 32.8 30.0 - 36.0 g/dL   RDW 13.1 11.5 - 15.5 %   Platelets 123 (L) 150 - 400 K/uL   nRBC 0.0 0.0 - 0.2 %   Neutrophils Relative % 67 %   Neutro Abs 7.1 1.7 - 7.7 K/uL   Lymphocytes Relative 20 %   Lymphs Abs 2.0 0.7 - 4.0 K/uL   Monocytes Relative 9 %   Monocytes Absolute 0.9 0.1 - 1.0 K/uL   Eosinophils Relative 2 %   Eosinophils Absolute 0.2 0.0 - 0.5 K/uL   Basophils Relative 1 %   Basophils Absolute 0.1 0.0 - 0.1 K/uL   Immature Granulocytes 1 %   Abs Immature Granulocytes 0.07 0.00 - 0.07 K/uL    Comment: Performed at  [facility], [address] [place], [place] [id number]  Basic metabolic panel     Status: Abnormal   Collection Time: 03/28/19  9:06 PM  Result Value Ref Range   Sodium 139 135 - 145 mmol/L   Potassium 4.1 3.5 - 5.1 mmol/L   Chloride 106 98 - 111 mmol/L   CO2 20 (L) 22 - 32 mmol/L   Glucose, Bld 128 (H) 70 - 99 mg/dL   BUN 42 (H) 8 - 23 mg/dL   Creatinine, Ser 1.43 (H) 0.61 - 1.24 mg/dL   Calcium 9.2 8.9 - 10.3 mg/dL   GFR calc non Af Amer 43 (L) >60 mL/min   GFR calc Af Amer 50 (L) >60 mL/min   Anion gap 13 5 - 15    Comment: Performed at [facility], [address] [place], [place] [address]  SARS Coronavirus 2 by RT PCR (hospital order, performed in [facility] hospital lab) Nasopharyngeal Nasopharyngeal Swab     Status: None   Collection Time: 03/28/19 10:05 PM   Specimen: Nasopharyngeal Swab  Result Value Ref Range   SARS Coronavirus 2 NEGATIVE NEGATIVE    Comment: (NOTE) If result is NEGATIVE SARS-CoV-2 target nucleic acids are NOT DETECTED. The SARS-CoV-2 RNA is generally detectable in upper and lower  respiratory specimens during the acute phase of infection. The lowest  concentration of SARS-CoV-2 viral copies this assay can detect is 250  copies / mL. A negative result does not preclude SARS-CoV-2 infection  and should not be used as the sole basis for treatment or other  patient management decisions.  A negative result may occur with  improper specimen collection / handling, submission of specimen other  than nasopharyngeal swab, presence of viral mutation(s) within the  areas targeted by this assay, and inadequate number of viral copies  (<250 copies / mL). A negative result must be combined with clinical  observations, patient history, and epidemiological information. If result is POSITIVE SARS-CoV-2 target nucleic acids are DETECTED. The SARS-CoV-2 RNA is generally detectable in upper and lower  respiratory specimens dur ing the acute phase  of infection.  Positive  results are indicative of active infection with SARS-CoV-2.  Clinical  correlation with patient history and other diagnostic information is  necessary to determine patient infection status.  Positive results do  not rule out bacterial infection or co-infection with other viruses. If result is PRESUMPTIVE POSTIVE SARS-CoV-2 nucleic acids MAY BE PRESENT.  A presumptive positive result was obtained on the submitted specimen  and confirmed on repeat testing.  While 2019 novel coronavirus  (SARS-CoV-2) nucleic acids may be present in the submitted sample  additional confirmatory testing may be necessary for epidemiological  and / or clinical management purposes  to differentiate between  SARS-CoV-2 and other Sarbecovirus currently known to infect humans.  If clinically indicated additional testing with an alternate test  methodology [phone]) is advised. The SARS-CoV-2 RNA is generally  detectable in upper and lower respiratory sp ecimens during the acute  phase of infection. The expected result is Negative. Fact Sheet for Patients:  StrictlyIdeas.no Fact Sheet for Healthcare Providers: BankingDealers.co.za This test is not yet approved or cleared by the [place] FDA and has been authorized for detection and/or diagnosis of SARS-CoV-2 by FDA under an Emergency Use Authorization (EUA).  This EUA will remain in effect (meaning this test can be used) for the duration of the COVID-19 declaration under Section 564(b)(1) of the Act, 21 U.S.C. section 360bbb-3(b)(1), unless the authorization is terminated or revoked sooner. Performed at [facility], [address] [place], [place] [address]   Glucose, capillary     Status: Abnormal   Collection Time: 03/29/19  1:50 AM  Result Value Ref Range   Glucose-Capillary 115 (H) 70 - 99 mg/dL  Basic metabolic panel     Status: Abnormal   Collection Time: 03/29/19   5:12 AM  Result Value Ref Range   Sodium 140 135 - 145 mmol/L   Potassium 4.2 3.5 - 5.1 mmol/L   Chloride 107 98 - 111 mmol/L   CO2 26 22 - 32 mmol/L   Glucose, Bld 122 (H) 70 - 99 mg/dL   BUN 36 (H) 8 - 23 mg/dL   Creatinine, Ser 1.31 (H) 0.61 - 1.24 mg/dL   Calcium 8.7 (L) 8.9 - 10.3 mg/dL   GFR calc non Af Amer 48 (L) >60 mL/min   GFR calc Af Amer 56 (L) >60 mL/min   Anion gap 7 5 - 15    Comment: Performed at [facility], [address] [place], [place] [address]  CBC     Status: Abnormal   Collection Time: 03/29/19  5:12 AM  Result Value Ref Range   WBC 9.9 4.0 - 10.5 K/uL   RBC 3.24 (L) 4.22 - 5.81 MIL/uL   Hemoglobin 11.1 (L) 13.0 - 17.0 g/dL   HCT 34.1 (L) 39.0 - 52.0 %   MCV 105.2 (H) 80.0 - 100.0 fL   MCH 34.3 (H) 26.0 - 34.0 pg   MCHC 32.6 30.0 - 36.0 g/dL   RDW 12.9 11.5 - 15.5 %   Platelets 101 (L) 150 - 400 K/uL    Comment: Immature Platelet Fraction may be clinically indicated, consider ordering this additional test [id number]    nRBC 0.0 0.0 - 0.2 %    Comment: Performed at [facility], [address] [place], [place] [address]  Glucose, capillary     Status: Abnormal   Collection Time: 03/29/19  6:24 AM  Result Value Ref Range   Glucose-Capillary 111 (H) 70 - 99 mg/dL   Comment 1 Notify RN     Dg Chest Port 1 View  Result Date: 03/28/2019 CLINICAL DATA:  Preop. Fall, hip fracture. EXAM: PORTABLE CHEST 1 VIEW COMPARISON:  Chest radiograph 11/14/2011 FINDINGS: Low lung volumes. Multi lead left-sided pacemaker in place. Post median sternotomy and CABG. Upper normal heart size with mild aortic tortuosity. Aortic atherosclerosis.  Central peribronchial thickening. Streaky left lung base opacities favor atelectasis. No confluent airspace disease. No large pleural effusion or pneumothorax. No acute osseous abnormalities are seen. IMPRESSION: 1. Low lung volumes with central peribronchial thickening. 2. Post CABG with left-sided pacemaker in  place. 3.  Aortic Atherosclerosis (ICD10-I70.0). Electronically Signed   By: Keith Rake M.D.   On: 03/28/2019 22:31   Dg Hip Unilat  With Pelvis 2-3 Views Right  Result Date: 03/28/2019 CLINICAL DATA:  Fall on right hip EXAM: DG HIP (WITH OR WITHOUT PELVIS) 2-3V RIGHT COMPARISON:  None. FINDINGS: The patient has had a prior plate and nail fixation of the right femur. There appears to be acute comminuted fracture of the greater trochanter which is mildly impacted. Healed fracture deformity of the lesser trochanter and femoral head neck junction is seen. There is moderate bilateral hip osteoarthritis with superior joint space loss and marginal osteophyte formation. Diffuse osteopenia. Vascular stent seen within the lower abdomen. IMPRESSION: acute comminuted mildly impacted fracture of the greater trochanter. Electronically Signed   By: Prudencio Pair M.D.   On: 03/28/2019 21:55    ROS Blood pressure (!) 143/87, pulse 95, temperature 97.6 F (36.4 C), temperature source Oral, resp. rate 19, height 6\' 3"  (1.905 m), weight 85.7 kg, SpO2 95 %. Physical Exam he is very tender over the proximal lateral thigh in the area of the fracture.  There is no ecchymosis at present but this will be expected.  Pain with logrolling of the leg. Radiographic review reveals prior dynamic hip screw with healed inotrope fracture lesser trochanter slightly off.  He has a new fracture of the greater trochanter.  Reviewing a prior CT of the abdomen and pelvis this was not fractured a few years ago so this is a definite new fracture but it is minimally displaced.   Assessment/Plan: Right greater trochanter fracture with history of prior fracture Recommendation is for weightbearing as tolerated with physical therapy and avoiding abduction exercises for about 6 weeks.  I suspect with his pain level and general frailty that he will require rehab stay.  Hessie Knows 03/29/2019, 7:00 AM

## 2019-03-29 NOTE — TOC Initial Note (Signed)
Transition of Care [facility]) - Initial/Assessment Note    Patient Details  Name: Nathaniel Woods MRN: [id number] Date of Birth: 1928/08/31  Transition of Care [facility]) CM/SW Contact:    Cassidy Tashiro, Lenice Llamas Phone Number: [phone]  03/29/2019, 1:20 PM  Clinical Narrative: Clinical Social Worker ([place]) reviewed chart and noted the patient will not have surgery and ortho surgeon recommended SNF. CSW met with patient alone at bedside this morning to discuss D/C plan. Patient was hard of hearing however he was alert and oriented X4. CSW introduced self and explained role of CSW department. Per patient he lives in [place] with his wife Nathaniel Woods. Patient reported that his son Nathaniel Woods [phone] is very supportive. CSW explained that PT will evaluate him and make a recommendation of SNF or home health. Patient reported that he needs to go to SNF because he does not feel safe to go home. CSW explained that [facility] will have to approve SNF. CSW provided patient with CMS SNF list. Patient verbalized his understanding and stated that his son Nathaniel Woods will be coming to [facility] today. Patient requested that CSW come back to his room when Nathaniel Woods arrives.   CSW came back to patient's room while his son Nathaniel Woods was at bedside. John requested SNF placement and prefers [facility] or [facility]. CSW explained [facility] is closed and not accepting patients that live outside of their independent living. Son verbalized his understanding and requested for CSW to call him with bed offers. FL2 complete and faxed out. CSW started [facility] SNF authorization today. CSW will continue to follow and assist as needed.               Expected Discharge Plan: Skilled Nursing Facility Barriers to Discharge: Continued Medical Work up   Patient Goals and CMS Choice Patient states their goals for this hospitalization and ongoing recovery are:: To go home. CMS Medicare.gov Compare Post Acute Care list provided to::  Patient Choice offered to / list presented to : Patient, Adult Children  Expected Discharge Plan and Services Expected Discharge Plan: [place] In-house Referral: Clinical Social Work   Post Acute Care Choice: [place] Living arrangements for the past 2 months: [place]                                      Prior Living Arrangements/Services Living arrangements for the past 2 months: Single Family Home Lives with:: Spouse Patient language and need for interpreter reviewed:: No Do you feel safe going back to the place where you live?: Yes      Need for Family Participation in Patient Care: Yes (Comment) Care giver support system in place?: Yes (comment)   Criminal Activity/Legal Involvement Pertinent to Current Situation/Hospitalization: No - Comment as needed  Activities of Daily Living Home Assistive Devices/Equipment: None ADL Screening (condition at time of admission) Patient's cognitive ability adequate to safely complete daily activities?: Yes Is the patient deaf or have difficulty hearing?: Yes Does the patient have difficulty seeing, even when wearing glasses/contacts?: No Does the patient have difficulty concentrating, remembering, or making decisions?: No Patient able to express need for assistance with ADLs?: Yes Does the patient have difficulty dressing or bathing?: No Independently performs ADLs?: Yes (appropriate for developmental age) Does the patient have difficulty walking or climbing stairs?: Yes Weakness of Legs: Both Weakness of Arms/Hands: None  Permission Sought/Granted  Permission sought to share information with : Chartered certified accountant granted to share information with : Yes, Verbal Permission Granted              Emotional Assessment Appearance:: Appears stated age Attitude/Demeanor/Rapport: Engaged Affect (typically observed): Pleasant, Calm Orientation: : Oriented to Self,  Oriented to Place, Oriented to  Time, Oriented to Situation Alcohol / Substance Use: Not Applicable Psych Involvement: No (comment)  Admission diagnosis:  Fall, initial encounter [W19.XXXA] Closed fracture of right hip, initial encounter [facility]) [S72.001A] Patient Active Problem List   Diagnosis Date Noted  . Hip fracture ([place]) 03/28/2019  . Carotid stenosis 08/14/2017  . DM (diabetes mellitus) ([place]) 11/02/2011  . CAD in native artery 11/02/2011  . HTN (hypertension) 11/02/2011  . Abdominal aortic aneurysm ([place]) 11/02/2011  . H/O Ventricular Fibrillation 11/02/2011  . 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 Hemorrhagic stroke ([place])   . Hyperlipidemia    PCP:  Derinda Late, MD Pharmacy:   [place], [place] [address] [place] [address] Phone: [phone] Fax: [phone]  [place], [place] [address] Phone: [phone] Fax: [phone]     Social Determinants of Health (SDOH) Interventions    Readmission Risk Interventions No flowsheet data found.

## 2019-03-29 NOTE — NC FL2 (Signed)
[place] LEVEL OF CARE SCREENING TOOL     IDENTIFICATION  Patient Name: Nathaniel Woods Birthdate: 06-24-1928 Sex: male Admission Date (Current Location): 03/28/2019  [place] and [place] Number:  Engineering geologist and Address:  [facility], [address] [place], [place] [address]      Provider Number: [id number]  Attending Physician Name and Address:  Nicholes Mango, MD  Relative Name and Phone Number:       Current Level of Care: Hospital Recommended Level of Care: [place] Prior Approval Number:    Date Approved/Denied:   PASRR Number: [id number]  Discharge Plan: SNF    Current Diagnoses: Patient Active Problem List   Diagnosis Date Noted  . Hip fracture ([place]) 03/28/2019  . Carotid stenosis 08/14/2017  . DM (diabetes mellitus) ([place]) 11/02/2011  . CAD in native artery 11/02/2011  . HTN (hypertension) 11/02/2011  . Abdominal aortic aneurysm ([place]) 11/02/2011  . H/O Ventricular Fibrillation 11/02/2011  . 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 Hemorrhagic stroke ([place])   . Hyperlipidemia     Orientation RESPIRATION BLADDER Height & Weight     Self, Time, Situation, Place  Normal Incontinent Weight: 188 lb 15 oz (85.7 kg) Height:  6\' 3"  (190.5 cm)  BEHAVIORAL SYMPTOMS/MOOD NEUROLOGICAL BOWEL NUTRITION STATUS      Continent Diet(Diet: Regular)  AMBULATORY STATUS COMMUNICATION OF NEEDS Skin   Extensive Assist Verbally Normal                       Personal Care Assistance Level of Assistance  Bathing, Feeding, Dressing Bathing Assistance: Limited assistance Feeding assistance: Limited assistance Dressing Assistance: Limited assistance     Functional Limitations Info  Sight, Hearing, Speech Sight Info: Adequate Hearing Info: Impaired Speech Info: Adequate    SPECIAL CARE FACTORS FREQUENCY  PT (By licensed PT), OT (By licensed OT)     PT Frequency: 5 OT Frequency: 5            Contractures       Additional Factors Info  Code Status, Allergies Code Status Info: Full Code. Allergies Info: Levofloxacin, Aspirin, Bee Venom, Chlorpheniramine, Decongestant Pseudoephedrine Hcl, Simvastatin, Zolpidem, Contrast Media Iodinated Diagnostic Agents.           Current Medications (03/29/2019):  This is the current hospital active medication list Current Facility-Administered Medications  Medication Dose Route Frequency Provider Last Rate Last Dose  . acetaminophen (TYLENOL) tablet 650 mg  650 mg Oral Q6H PRN Lance Coon, MD       Or  . acetaminophen (TYLENOL) suppository 650 mg  650 mg Rectal Q6H PRN Lance Coon, MD      . apixaban Arne [place]) tablet 5 mg  5 mg Oral BID Gouru, Aruna, MD      . cyclobenzaprine (FLEXERIL) tablet 10 mg  10 mg Oral TID PRN Nicholes Mango, MD   10 mg at 03/29/19 1035  . diazepam (VALIUM) tablet 2 mg  2 mg Oral QHS PRN Harrie Foreman, MD   2 mg at 03/29/19 0316  . insulin aspart (novoLOG) injection 0-9 Units  0-9 Units Subcutaneous Q6H Lance Coon, MD      . mirtazapine (REMERON) tablet 15 mg  15 mg Oral QHS Harrie Foreman, MD      . ondansetron [facility]) tablet 4 mg  4 mg Oral Q6H PRN Lance Coon, MD       Or  . ondansetron [facility]) injection 4 mg  4  mg Intravenous Q6H PRN Lance Coon, MD      . oxyCODONE (Oxy IR/ROXICODONE) immediate release tablet 5 mg  5 mg Oral Q4H PRN Lance Coon, MD         Discharge Medications: Please see discharge summary for a list of discharge medications.  Relevant Imaging Results:  Relevant Lab Results:   Additional Information SSN: [id number]  Nathaniel Woods, Nathaniel Beets, LCSW

## 2019-03-29 NOTE — TOC Progression Note (Signed)
Transition of Care [facility]) - Progression Note    Patient Details  Name: Nathaniel Woods MRN: [id number] Date of Birth: 1928-08-06  Transition of Care [facility]) CM/SW Contact  Aracelie Addis, Lenice Llamas Phone Number: [phone]  03/29/2019, 4:12 PM  Clinical Narrative: Clinical Social Worker (CSW) contacted patient's son [name] and presented bed offers. Per [name] he wants [facility] however he may call the Development worker, international aid at [place] to see if he can arrange a bed there. CSW made John aware that [employer] [facility] [place] declined referral. Per [name] he can get that changed by making a few calls. CSW also made John aware that [facility] SNF authorization has been received, authorization # [id number]. CSW made John aware that [facility] will require the co-pays up front, $20 per day for 14 days. John reported that paying the co-pays up front is not an issue. CSW will follow up with [name] when he is at [facility] tomorrow. CSW will continue to follow and assist as needed.     Expected Discharge Plan: [place] Barriers to Discharge: Continued Medical Work up  Expected Discharge Plan and Services Expected Discharge Plan: [place] In-house Referral: Clinical Social Work   Post Acute Care Choice: [place] Living arrangements for the past 2 months: Single Family Home                                       Social Determinants of Health (SDOH) Interventions    Readmission Risk Interventions No flowsheet data found.

## 2019-03-30 DIAGNOSIS — I480 Paroxysmal atrial fibrillation: Secondary | ICD-10-CM | POA: Diagnosis not present

## 2019-03-30 DIAGNOSIS — F419 Anxiety disorder, unspecified: Secondary | ICD-10-CM | POA: Diagnosis not present

## 2019-03-30 DIAGNOSIS — E119 Type 2 diabetes mellitus without complications: Secondary | ICD-10-CM | POA: Diagnosis not present

## 2019-03-30 DIAGNOSIS — S72114A Nondisplaced fracture of greater trochanter of right femur, initial encounter for closed fracture: Secondary | ICD-10-CM | POA: Diagnosis not present

## 2019-03-30 DIAGNOSIS — Z7901 Long term (current) use of anticoagulants: Secondary | ICD-10-CM | POA: Diagnosis not present

## 2019-03-30 DIAGNOSIS — J45909 Unspecified asthma, uncomplicated: Secondary | ICD-10-CM | POA: Diagnosis not present

## 2019-03-30 DIAGNOSIS — M160 Bilateral primary osteoarthritis of hip: Secondary | ICD-10-CM | POA: Diagnosis not present

## 2019-03-30 DIAGNOSIS — R279 Unspecified lack of coordination: Secondary | ICD-10-CM | POA: Diagnosis not present

## 2019-03-30 DIAGNOSIS — I5022 Chronic systolic (congestive) heart failure: Secondary | ICD-10-CM | POA: Diagnosis not present

## 2019-03-30 DIAGNOSIS — S72111D Displaced fracture of greater trochanter of right femur, subsequent encounter for closed fracture with routine healing: Secondary | ICD-10-CM | POA: Diagnosis not present

## 2019-03-30 DIAGNOSIS — I2510 Atherosclerotic heart disease of native coronary artery without angina pectoris: Secondary | ICD-10-CM | POA: Diagnosis not present

## 2019-03-30 DIAGNOSIS — G4700 Insomnia, unspecified: Secondary | ICD-10-CM | POA: Diagnosis not present

## 2019-03-30 DIAGNOSIS — I252 Old myocardial infarction: Secondary | ICD-10-CM | POA: Diagnosis not present

## 2019-03-30 DIAGNOSIS — F329 Major depressive disorder, single episode, unspecified: Secondary | ICD-10-CM | POA: Diagnosis not present

## 2019-03-30 DIAGNOSIS — S72009 Fracture of unspecified part of neck of unspecified femur: Secondary | ICD-10-CM | POA: Diagnosis not present

## 2019-03-30 DIAGNOSIS — E118 Type 2 diabetes mellitus with unspecified complications: Secondary | ICD-10-CM | POA: Diagnosis not present

## 2019-03-30 DIAGNOSIS — N401 Enlarged prostate with lower urinary tract symptoms: Secondary | ICD-10-CM | POA: Diagnosis not present

## 2019-03-30 DIAGNOSIS — M80051D Age-related osteoporosis with current pathological fracture, right femur, subsequent encounter for fracture with routine healing: Secondary | ICD-10-CM | POA: Diagnosis not present

## 2019-03-30 DIAGNOSIS — D126 Benign neoplasm of colon, unspecified: Secondary | ICD-10-CM | POA: Diagnosis not present

## 2019-03-30 DIAGNOSIS — Z743 Need for continuous supervision: Secondary | ICD-10-CM | POA: Diagnosis not present

## 2019-03-30 DIAGNOSIS — R338 Other retention of urine: Secondary | ICD-10-CM | POA: Diagnosis not present

## 2019-03-30 DIAGNOSIS — I482 Chronic atrial fibrillation, unspecified: Secondary | ICD-10-CM | POA: Diagnosis not present

## 2019-03-30 DIAGNOSIS — E785 Hyperlipidemia, unspecified: Secondary | ICD-10-CM | POA: Diagnosis not present

## 2019-03-30 DIAGNOSIS — Z8673 Personal history of transient ischemic attack (TIA), and cerebral infarction without residual deficits: Secondary | ICD-10-CM | POA: Diagnosis not present

## 2019-03-30 DIAGNOSIS — H409 Unspecified glaucoma: Secondary | ICD-10-CM | POA: Diagnosis not present

## 2019-03-30 DIAGNOSIS — Z951 Presence of aortocoronary bypass graft: Secondary | ICD-10-CM | POA: Diagnosis not present

## 2019-03-30 DIAGNOSIS — I700 Atherosclerosis of aorta: Secondary | ICD-10-CM | POA: Diagnosis not present

## 2019-03-30 DIAGNOSIS — R531 Weakness: Secondary | ICD-10-CM | POA: Diagnosis not present

## 2019-03-30 DIAGNOSIS — I10 Essential (primary) hypertension: Secondary | ICD-10-CM | POA: Diagnosis not present

## 2019-03-30 DIAGNOSIS — I714 Abdominal aortic aneurysm, without rupture: Secondary | ICD-10-CM | POA: Diagnosis not present

## 2019-03-30 DIAGNOSIS — W19XXXD Unspecified fall, subsequent encounter: Secondary | ICD-10-CM | POA: Diagnosis not present

## 2019-03-30 DIAGNOSIS — I110 Hypertensive heart disease with heart failure: Secondary | ICD-10-CM | POA: Diagnosis not present

## 2019-03-30 LAB — HEMOGLOBIN A1C
Hgb A1c MFr Bld: 5.6 % (ref 4.8–5.6)
Mean Plasma Glucose: 114 mg/dL

## 2019-03-30 LAB — GLUCOSE, CAPILLARY
Glucose-Capillary: 142 mg/dL — ABNORMAL HIGH (ref 70–99)
Glucose-Capillary: 148 mg/dL — ABNORMAL HIGH (ref 70–99)

## 2019-03-30 MED ORDER — OXYCODONE HCL 5 MG PO TABS: 5.0000 mg | ORAL_TABLET | ORAL | 0 refills | Status: DC | PRN

## 2019-03-30 MED ORDER — ACETAMINOPHEN 325 MG PO TABS: 650.0000 mg | ORAL_TABLET | Freq: Four times a day (QID) | ORAL | Status: AC | PRN

## 2019-03-30 MED ORDER — METOPROLOL TARTRATE 25 MG PO TABS
12.5000 mg | ORAL_TABLET | Freq: Once | ORAL | Status: AC
  Administered 2019-03-30: 12.5 mg via ORAL
  Filled 2019-03-30: qty 1

## 2019-03-30 MED ORDER — ONDANSETRON HCL 4 MG PO TABS: 4.0000 mg | ORAL_TABLET | Freq: Four times a day (QID) | ORAL | 0 refills | Status: DC | PRN

## 2019-03-30 MED ORDER — DIAZEPAM 2 MG PO TABS: 2.0000 mg | ORAL_TABLET | Freq: Every evening | ORAL | 0 refills | Status: DC | PRN

## 2019-03-30 MED ORDER — CYCLOBENZAPRINE HCL 10 MG PO TABS: 10.0000 mg | ORAL_TABLET | Freq: Three times a day (TID) | ORAL | 0 refills | Status: DC | PRN

## 2019-03-30 NOTE — TOC Transition Note (Signed)
Transition of Care [facility]) - CM/SW Discharge Note   Patient Details  Name: Nathaniel Woods MRN: [id number] Date of Birth: 1928/11/17  Transition of Care [facility]) CM/SW Contact:  Aoife Bold, Lenice Llamas Phone Number: [phone]  03/30/2019, 1:23 PM   Clinical Narrative: Clinical Social Worker (CSW) met with patient and his son [name] was at bedside. Per son they have decided to go to [facility] and are agreeable to pay the co-pay up front. CSW explained that [facility] SNF authorization has been received and patient can D/C today. RN will call report and arrange EMS for transport. CSW sent D/C orders to [facility] via [place]. Per [facility] admissions coordinator at [facility] patient can come today to room 509. Magda Paganini is aware that patient's most recent negative covid test was on 10/19. Patient is aware of above. Patient's son [name] is aware of above. Please reconsult if future social work needs arise. CSW signing off.     Final next level of care: Skilled Nursing Facility Barriers to Discharge: Barriers Resolved   Patient Goals and CMS Choice Patient states their goals for this hospitalization and ongoing recovery are:: To go home. CMS Medicare.gov Compare Post Acute Care list provided to:: Patient Choice offered to / list presented to : Patient, Adult Children  Discharge Placement   Existing PASRR number confirmed : 03/29/19          Patient [facility] Patient to be transferred to facility by: [facility] Name of family member notified: Patient's son [name] is aware of D/C today. Patient and family notified of of transfer: 03/30/19  Discharge Plan and Services In-house Referral: Clinical Social Work   Post Acute Care Choice: [place]          DME Arranged: N/A         HH Arranged: NA          Social Determinants of Health (SDOH) Interventions     Readmission Risk Interventions No flowsheet data  found.

## 2019-03-30 NOTE — Discharge Instructions (Signed)
weightbearing as tolerated with physical therapy and avoiding abduction exercises for about 6 weeks Follow with primary care physician in 3 days Follow-up with Dr. Rudene Christians orthopedics in 2 weeks

## 2019-03-30 NOTE — Discharge Summary (Signed)
[facility] NAME: Nathaniel Woods    MR#:  [id number]  DATE OF BIRTH:  [birth year]  DATE OF ADMISSION:  03/28/2019 ADMITTING PHYSICIAN: Lance Coon, MD  DATE OF DISCHARGE:  03/30/19  PRIMARY CARE PHYSICIAN: Derinda Late, MD    ADMISSION DIAGNOSIS:  Fall, initial encounter [W19.XXXA] Closed fracture of right hip, initial encounter ([place]) [S72.001A]  DISCHARGE DIAGNOSIS:  Hip fracture-right side  SECONDARY DIAGNOSIS:   Past Medical History:  Diagnosis Date  . Angina   . Anxiety    "just before major surgery"  . Arthritis   . Asthma    childhood  . BPH associated with nocturia   . Coronary artery disease   . Hemorrhagic stroke ([place]) 83/2010   S/P fall  . Hyperlipidemia   . Hypertension   . Inferior MI ([place]) 83/2013  . Kidney stone   . Stroke ([place])   . Type II diabetes mellitus [facility])     HOSPITAL COURSE:   #Acute right hip pain from right greater trochanter fracture with history of prior fracture Seen by Dr. Rudene Christians orthopedics not considering any surgery at this time Recommendation is for weightbearing as tolerated with physical therapy and avoiding abduction exercises for about 6 weeks Pain management as needed Outpatient follow-up with Dr. Rudene Christians in 2 weeks Patient will be dispositioned to [facility] today  #Diabetes mellitus sliding scale insulin and resume Metformin his home dose  #Coronary artery disease continue home medication Eliquis, metoprolol and statin  #Chronic atrial fibrillation rate controlled,-continue anticoagulation with Eliquis Continue metoprolol home medication  #Essential hypertension-continue metoprolol his home medication and holding Cozaar and titrate as needed.  Not sure why patient is on 2 beta-blockers atenolol and metoprolol at this point I am discontinuing atenolol  #Hyperlipidemia-statin DISCHARGE CONDITIONS:   Fair  CONSULTS OBTAINED:  Treatment Team:  Hessie Knows, MD   PROCEDURES none  DRUG ALLERGIES:   Allergies  Allergen Reactions  . Levofloxacin Rash  . Aspirin Other (See Comments)    Hemorrhagic Stroke  . Bee Venom Other (See Comments)    Reaction: fever  . Chlorpheniramine Other (See Comments)    Reaction: unknown  . Decongestant [Pseudoephedrine Hcl] Other (See Comments)    Reaction: unknown  . Simvastatin Other (See Comments)    Other reaction(s): Muscle Pain  . Zolpidem Other (See Comments)    Other reaction(s): Unknown "went bananas"  . Contrast Media [Iodinated Diagnostic Agents] Rash    DISCHARGE MEDICATIONS:   Allergies as of 03/30/2019      Reactions   Levofloxacin Rash   Aspirin Other (See Comments)   Hemorrhagic Stroke   Bee Venom Other (See Comments)   Reaction: fever   Chlorpheniramine Other (See Comments)   Reaction: unknown   Decongestant [pseudoephedrine Hcl] Other (See Comments)   Reaction: unknown   Simvastatin Other (See Comments)   Other reaction(s): Muscle Pain   Zolpidem Other (See Comments)   Other reaction(s): Unknown "went bananas"   Contrast Media [iodinated Diagnostic Agents] Rash      Medication List    STOP taking these medications   amoxicillin 500 MG capsule Commonly known as: AMOXIL   atenolol 50 MG tablet Commonly known as: TENORMIN   losartan 50 MG tablet Commonly known as: COZAAR     TAKE these medications   acetaminophen 325 MG tablet Commonly known as: TYLENOL Take 2 tablets (650 mg total) by mouth every 6 (six) hours as needed for mild pain (or  Fever >/= 101). What changed:   medication strength  how much to take  when to take this  reasons to take this   COQ10 PO Take 1 Dose by mouth daily.   CRESTOR PO Take 5 mg by mouth every Monday, Wednesday, and Friday.   cyclobenzaprine 10 MG tablet Commonly known as: FLEXERIL Take 1 tablet (10 mg total) by mouth 3 (three) times daily as needed for muscle spasms.   diazepam 2 MG tablet Commonly known  as: VALIUM Take 1 tablet (2 mg total) by mouth at bedtime as needed (sleep).   diphenhydrAMINE 25 mg capsule Commonly known as: BENADRYL Take 1 capsule (25 mg total) by mouth at bedtime as needed for sleep.   Eliquis 5 MG Tabs tablet Generic drug: apixaban Take 5 mg by mouth 2 (two) times daily.   finasteride 5 MG tablet Commonly known as: PROSCAR Take 5 mg by mouth daily.   Fish Oil 1200 MG Caps Take 1 capsule by mouth daily.   fluticasone 50 MCG/ACT nasal spray Commonly known as: FLONASE Place 2 sprays into both nostrils daily.   furosemide 20 MG tablet Commonly known as: LASIX Take 20 mg by mouth daily.   latanoprost 0.005 % ophthalmic solution Commonly known as: XALATAN Place 1 drop into both eyes at bedtime.   metFORMIN 500 MG tablet Commonly known as: GLUCOPHAGE Take 500 mg by mouth daily with breakfast.   metoprolol succinate 50 MG 24 hr tablet Commonly known as: TOPROL-XL Take 50 mg by mouth daily.   mirtazapine 15 MG tablet Commonly known as: REMERON Take 15 mg by mouth at bedtime.   montelukast 10 MG tablet Commonly known as: SINGULAIR Take 10 mg by mouth at bedtime.   multivitamin with minerals Tabs tablet Take 1 tablet by mouth daily.   ondansetron 4 MG tablet Commonly known as: ZOFRAN Take 1 tablet (4 mg total) by mouth every 6 (six) hours as needed for nausea.   oxyCODONE 5 MG immediate release tablet Commonly known as: Oxy IR/ROXICODONE Take 1 tablet (5 mg total) by mouth every 4 (four) hours as needed for moderate pain.   spironolactone 25 MG tablet Commonly known as: ALDACTONE Take 12.5 mg by mouth daily.        DISCHARGE INSTRUCTIONS:  Follow with primary care physician in 3 days Follow-up with Dr. Rudene Christians orthopedics in 2 weeks   DIET:  Cardiac diet and Diabetic diet  DISCHARGE CONDITION:  Fair  ACTIVITY:  Activity as tolerated PER ORTHO recommendations, weightbearing as tolerated with physical therapy and avoiding abduction  exercises for about 6 weeks  OXYGEN:  Home Oxygen: No.   Oxygen Delivery: room air  DISCHARGE LOCATION:  nursing home   If you experience worsening of your admission symptoms, develop shortness of breath, life threatening emergency, suicidal or homicidal thoughts you must seek medical attention immediately by calling 911 or calling your MD immediately  if symptoms less severe.  You Must read complete instructions/literature along with all the possible adverse reactions/side effects for all the Medicines you take and that have been prescribed to you. Take any new Medicines after you have completely understood and accpet all the possible adverse reactions/side effects.   Please note  You were cared for by a hospitalist during your hospital stay. If you have any questions about your discharge medications or the care you received while you were in the hospital after you are discharged, you can call the unit and asked to speak with the hospitalist on call  if the hospitalist that took care of you is not available. Once you are discharged, your primary care physician will handle any further medical issues. Please note that NO REFILLS for any discharge medications will be authorized once you are discharged, as it is imperative that you return to your primary care physician (or establish a relationship with a primary care physician if you do not have one) for your aftercare needs so that they can reassess your need for medications and monitor your lab values.     Today  Chief Complaint  Patient presents with  . Fall   Patient is doing fine and agreeable to go to rehabilitation center [facility] today.  Son at bedside.  Pain is manageable  ROS:  CONSTITUTIONAL: Denies fevers, chills. Denies any fatigue, weakness.  EYES: Denies blurry vision, double vision, eye pain. EARS, NOSE, THROAT: Denies tinnitus, ear pain, hearing loss. RESPIRATORY: Denies cough, wheeze, shortness of breath.   CARDIOVASCULAR: Denies chest pain, palpitations, edema.  GASTROINTESTINAL: Denies nausea, vomiting, diarrhea, abdominal pain. Denies bright red blood per rectum. GENITOURINARY: Denies dysuria, hematuria. ENDOCRINE: Denies nocturia or thyroid problems. HEMATOLOGIC AND LYMPHATIC: Denies easy bruising or bleeding. SKIN: Denies rash or lesion. MUSCULOSKELETAL: Right hip pain  NEUROLOGIC: Denies paralysis, paresthesias.  PSYCHIATRIC: Denies anxiety or depressive symptoms.   VITAL SIGNS:  Blood pressure (!) 119/55, pulse 91, temperature 98.3 F (36.8 C), temperature source Oral, resp. rate 18, height 6\' 3"  (1.905 m), weight 85.7 kg, SpO2 97 %.  I/O:    Intake/Output Summary (Last 24 hours) at 03/30/2019 1317 Last data filed at 03/30/2019 1010 Gross per 24 hour  Intake 240 ml  Output 700 ml  Net -460 ml    PHYSICAL EXAMINATION:  GENERAL:  85 y.o.-year-old patient lying in the bed with no acute distress.  EYES: Pupils equal, round, reactive to light and accommodation. No scleral icterus. Extraocular muscles intact.  HEENT: Head atraumatic, normocephalic. Oropharynx and nasopharynx clear.  NECK:  Supple, no jugular venous distention. No thyroid enlargement, no tenderness.  LUNGS: Normal breath sounds bilaterally, no wheezing, rales,rhonchi or crepitation. No use of accessory muscles of respiration.  CARDIOVASCULAR: S1, S2 normal. No murmurs, rubs, or gallops.  ABDOMEN: Soft, non-tender, non-distended. Bowel sounds present.  EXTREMITIES: Right hip is tender, abducted no pedal edema, cyanosis, or clubbing.  NEUROLOGIC: Cranial nerves II through XII are intact. Sensation intact. Gait not checked.  PSYCHIATRIC: The patient is alert and oriented x 3.  SKIN: No obvious rash, lesion, or ulcer.   DATA REVIEW:   CBC Recent Labs  Lab 03/29/19 0512  WBC 9.9  HGB 11.1*  HCT 34.1*  PLT 101*    Chemistries  Recent Labs  Lab 03/29/19 0512  NA 140  K 4.2  CL 107  CO2 26  GLUCOSE  122*  BUN 36*  CREATININE 1.31*  CALCIUM 8.7*    Cardiac Enzymes No results for input(s): TROPONINI in the last 168 hours.  Microbiology Results  Results for orders placed or performed during the hospital encounter of 03/28/19  SARS Coronavirus 2 by RT PCR (hospital order, performed in [facility] hospital lab) Nasopharyngeal Nasopharyngeal Swab     Status: None   Collection Time: 03/28/19 10:05 PM   Specimen: Nasopharyngeal Swab  Result Value Ref Range Status   SARS Coronavirus 2 NEGATIVE NEGATIVE Final    Comment: (NOTE) If result is NEGATIVE SARS-CoV-2 target nucleic acids are NOT DETECTED. The SARS-CoV-2 RNA is generally detectable in upper and lower  respiratory specimens during  the acute phase of infection. The lowest  concentration of SARS-CoV-2 viral copies this assay can detect is 250  copies / mL. A negative result does not preclude SARS-CoV-2 infection  and should not be used as the sole basis for treatment or other  patient management decisions.  A negative result may occur with  improper specimen collection / handling, submission of specimen other  than nasopharyngeal swab, presence of viral mutation(s) within the  areas targeted by this assay, and inadequate number of viral copies  (<250 copies / mL). A negative result must be combined with clinical  observations, patient history, and epidemiological information. If result is POSITIVE SARS-CoV-2 target nucleic acids are DETECTED. The SARS-CoV-2 RNA is generally detectable in upper and lower  respiratory specimens dur ing the acute phase of infection.  Positive  results are indicative of active infection with SARS-CoV-2.  Clinical  correlation with patient history and other diagnostic information is  necessary to determine patient infection status.  Positive results do  not rule out bacterial infection or co-infection with other viruses. If result is PRESUMPTIVE POSTIVE SARS-CoV-2 nucleic acids MAY BE PRESENT.    A presumptive positive result was obtained on the submitted specimen  and confirmed on repeat testing.  While 2019 novel coronavirus  (SARS-CoV-2) nucleic acids may be present in the submitted sample  additional confirmatory testing may be necessary for epidemiological  and / or clinical management purposes  to differentiate between  SARS-CoV-2 and other Sarbecovirus currently known to infect humans.  If clinically indicated additional testing with an alternate test  methodology [phone]) is advised. The SARS-CoV-2 RNA is generally  detectable in upper and lower respiratory sp ecimens during the acute  phase of infection. The expected result is Negative. Fact Sheet for Patients:  StrictlyIdeas.no Fact Sheet for Healthcare Providers: BankingDealers.co.za This test is not yet approved or cleared by the [place] FDA and has been authorized for detection and/or diagnosis of SARS-CoV-2 by FDA under an Emergency Use Authorization (EUA).  This EUA will remain in effect (meaning this test can be used) for the duration of the COVID-19 declaration under Section 564(b)(1) of the Act, 21 U.S.C. section 360bbb-3(b)(1), unless the authorization is terminated or revoked sooner. Performed at [facility], [address] [place], [place] [address]     RADIOLOGY:  Dg Chest Port 1 View  Result Date: 03/28/2019 CLINICAL DATA:  Preop. Fall, hip fracture. EXAM: PORTABLE CHEST 1 VIEW COMPARISON:  Chest radiograph 11/14/2011 FINDINGS: Low lung volumes. Multi lead left-sided pacemaker in place. Post median sternotomy and CABG. Upper normal heart size with mild aortic tortuosity. Aortic atherosclerosis. Central peribronchial thickening. Streaky left lung base opacities favor atelectasis. No confluent airspace disease. No large pleural effusion or pneumothorax. No acute osseous abnormalities are seen. IMPRESSION: 1. Low lung volumes with central  peribronchial thickening. 2. Post CABG with left-sided pacemaker in place. 3.  Aortic Atherosclerosis (ICD10-I70.0). Electronically Signed   By: Keith Rake M.D.   On: 03/28/2019 22:31   Dg Hip Unilat  With Pelvis 2-3 Views Right  Result Date: 03/28/2019 CLINICAL DATA:  Fall on right hip EXAM: DG HIP (WITH OR WITHOUT PELVIS) 2-3V RIGHT COMPARISON:  None. FINDINGS: The patient has had a prior plate and nail fixation of the right femur. There appears to be acute comminuted fracture of the greater trochanter which is mildly impacted. Healed fracture deformity of the lesser trochanter and femoral head neck junction is seen. There is moderate bilateral hip osteoarthritis with superior joint  space loss and marginal osteophyte formation. Diffuse osteopenia. Vascular stent seen within the lower abdomen. IMPRESSION: acute comminuted mildly impacted fracture of the greater trochanter. Electronically Signed   By: Prudencio Pair M.D.   On: 03/28/2019 21:55    EKG:   Orders placed or performed during the hospital encounter of 03/28/19  . EKG 12-Lead  . EKG 12-Lead  . EKG 12-Lead  . EKG 12-Lead  . EKG 12-Lead  . EKG 12-Lead  . ED EKG  . ED EKG      Management plans discussed with the patient, family and they are in agreement.  CODE STATUS:     Code Status Orders  (From admission, onward)         Start     Ordered   03/29/19 0008  Full code  Continuous     03/29/19 0008        Code Status History    This patient has a current code status but no historical code status.   Advance Care Planning Activity      TOTAL TIME TAKING CARE OF THIS PATIENT: 45 minutes.   Note: This dictation was prepared with Dragon dictation along with smaller phrase technology. Any transcriptional errors that result from this process are unintentional.   @[facility] @  on 03/30/2019 at 1:17 PM  Between 7am to 6pm - Pager - [phone]  After 6pm go to [email or web address] - password [email or web address] [facility]  [facility]  Office  [phone]  CC: Primary care physician; Derinda Late, MD

## 2019-03-31 DIAGNOSIS — M80051D Age-related osteoporosis with current pathological fracture, right femur, subsequent encounter for fracture with routine healing: Secondary | ICD-10-CM | POA: Diagnosis not present

## 2019-03-31 DIAGNOSIS — E118 Type 2 diabetes mellitus with unspecified complications: Secondary | ICD-10-CM | POA: Diagnosis not present

## 2019-03-31 DIAGNOSIS — I480 Paroxysmal atrial fibrillation: Secondary | ICD-10-CM | POA: Diagnosis not present

## 2019-03-31 DIAGNOSIS — Z7901 Long term (current) use of anticoagulants: Secondary | ICD-10-CM | POA: Diagnosis not present

## 2019-03-31 DIAGNOSIS — I5022 Chronic systolic (congestive) heart failure: Secondary | ICD-10-CM | POA: Diagnosis not present

## 2019-03-31 LAB — GLUCOSE, CAPILLARY: Glucose-Capillary: 134 mg/dL — ABNORMAL HIGH (ref 70–99)

## 2019-04-20 DIAGNOSIS — S72114A Nondisplaced fracture of greater trochanter of right femur, initial encounter for closed fracture: Secondary | ICD-10-CM | POA: Diagnosis not present

## 2019-04-26 DIAGNOSIS — G5793 Unspecified mononeuropathy of bilateral lower limbs: Secondary | ICD-10-CM | POA: Diagnosis not present

## 2019-04-27 DIAGNOSIS — I2510 Atherosclerotic heart disease of native coronary artery without angina pectoris: Secondary | ICD-10-CM | POA: Diagnosis not present

## 2019-04-27 DIAGNOSIS — R2689 Other abnormalities of gait and mobility: Secondary | ICD-10-CM | POA: Diagnosis not present

## 2019-04-27 DIAGNOSIS — F329 Major depressive disorder, single episode, unspecified: Secondary | ICD-10-CM | POA: Diagnosis not present

## 2019-04-27 DIAGNOSIS — I482 Chronic atrial fibrillation, unspecified: Secondary | ICD-10-CM | POA: Diagnosis not present

## 2019-04-27 DIAGNOSIS — I5022 Chronic systolic (congestive) heart failure: Secondary | ICD-10-CM | POA: Diagnosis not present

## 2019-04-27 DIAGNOSIS — S72111D Displaced fracture of greater trochanter of right femur, subsequent encounter for closed fracture with routine healing: Secondary | ICD-10-CM | POA: Diagnosis not present

## 2019-04-27 DIAGNOSIS — R531 Weakness: Secondary | ICD-10-CM | POA: Diagnosis not present

## 2019-04-27 DIAGNOSIS — E119 Type 2 diabetes mellitus without complications: Secondary | ICD-10-CM | POA: Diagnosis not present

## 2019-04-27 DIAGNOSIS — I110 Hypertensive heart disease with heart failure: Secondary | ICD-10-CM | POA: Diagnosis not present

## 2019-04-27 DIAGNOSIS — M160 Bilateral primary osteoarthritis of hip: Secondary | ICD-10-CM | POA: Diagnosis not present

## 2019-05-02 DIAGNOSIS — M160 Bilateral primary osteoarthritis of hip: Secondary | ICD-10-CM | POA: Diagnosis not present

## 2019-05-02 DIAGNOSIS — I2510 Atherosclerotic heart disease of native coronary artery without angina pectoris: Secondary | ICD-10-CM | POA: Diagnosis not present

## 2019-05-02 DIAGNOSIS — S72111D Displaced fracture of greater trochanter of right femur, subsequent encounter for closed fracture with routine healing: Secondary | ICD-10-CM | POA: Diagnosis not present

## 2019-05-02 DIAGNOSIS — R531 Weakness: Secondary | ICD-10-CM | POA: Diagnosis not present

## 2019-05-02 DIAGNOSIS — I5022 Chronic systolic (congestive) heart failure: Secondary | ICD-10-CM | POA: Diagnosis not present

## 2019-05-02 DIAGNOSIS — R2689 Other abnormalities of gait and mobility: Secondary | ICD-10-CM | POA: Diagnosis not present

## 2019-05-02 DIAGNOSIS — I482 Chronic atrial fibrillation, unspecified: Secondary | ICD-10-CM | POA: Diagnosis not present

## 2019-05-02 DIAGNOSIS — I110 Hypertensive heart disease with heart failure: Secondary | ICD-10-CM | POA: Diagnosis not present

## 2019-05-11 DIAGNOSIS — R531 Weakness: Secondary | ICD-10-CM | POA: Diagnosis not present

## 2019-05-11 DIAGNOSIS — I110 Hypertensive heart disease with heart failure: Secondary | ICD-10-CM | POA: Diagnosis not present

## 2019-05-11 DIAGNOSIS — I482 Chronic atrial fibrillation, unspecified: Secondary | ICD-10-CM | POA: Diagnosis not present

## 2019-05-11 DIAGNOSIS — M160 Bilateral primary osteoarthritis of hip: Secondary | ICD-10-CM | POA: Diagnosis not present

## 2019-05-11 DIAGNOSIS — S72111D Displaced fracture of greater trochanter of right femur, subsequent encounter for closed fracture with routine healing: Secondary | ICD-10-CM | POA: Diagnosis not present

## 2019-05-11 DIAGNOSIS — E119 Type 2 diabetes mellitus without complications: Secondary | ICD-10-CM | POA: Diagnosis not present

## 2019-05-11 DIAGNOSIS — R2689 Other abnormalities of gait and mobility: Secondary | ICD-10-CM | POA: Diagnosis not present

## 2019-05-11 DIAGNOSIS — I2510 Atherosclerotic heart disease of native coronary artery without angina pectoris: Secondary | ICD-10-CM | POA: Diagnosis not present

## 2019-05-11 DIAGNOSIS — F329 Major depressive disorder, single episode, unspecified: Secondary | ICD-10-CM | POA: Diagnosis not present

## 2019-05-11 DIAGNOSIS — I5022 Chronic systolic (congestive) heart failure: Secondary | ICD-10-CM | POA: Diagnosis not present

## 2019-05-12 DIAGNOSIS — Z79899 Other long term (current) drug therapy: Secondary | ICD-10-CM | POA: Diagnosis not present

## 2019-05-12 DIAGNOSIS — E7800 Pure hypercholesterolemia, unspecified: Secondary | ICD-10-CM | POA: Diagnosis not present

## 2019-05-12 DIAGNOSIS — E118 Type 2 diabetes mellitus with unspecified complications: Secondary | ICD-10-CM | POA: Diagnosis not present

## 2019-05-18 DIAGNOSIS — I2510 Atherosclerotic heart disease of native coronary artery without angina pectoris: Secondary | ICD-10-CM | POA: Diagnosis not present

## 2019-05-18 DIAGNOSIS — I5022 Chronic systolic (congestive) heart failure: Secondary | ICD-10-CM | POA: Diagnosis not present

## 2019-05-18 DIAGNOSIS — R55 Syncope and collapse: Secondary | ICD-10-CM | POA: Diagnosis not present

## 2019-05-18 DIAGNOSIS — I714 Abdominal aortic aneurysm, without rupture: Secondary | ICD-10-CM | POA: Diagnosis not present

## 2019-05-18 DIAGNOSIS — Z95810 Presence of automatic (implantable) cardiac defibrillator: Secondary | ICD-10-CM | POA: Diagnosis not present

## 2019-05-18 DIAGNOSIS — I10 Essential (primary) hypertension: Secondary | ICD-10-CM | POA: Diagnosis not present

## 2019-05-18 DIAGNOSIS — I480 Paroxysmal atrial fibrillation: Secondary | ICD-10-CM | POA: Diagnosis not present

## 2019-05-18 DIAGNOSIS — Z951 Presence of aortocoronary bypass graft: Secondary | ICD-10-CM | POA: Diagnosis not present

## 2019-05-18 DIAGNOSIS — R001 Bradycardia, unspecified: Secondary | ICD-10-CM | POA: Diagnosis not present

## 2019-05-18 DIAGNOSIS — R600 Localized edema: Secondary | ICD-10-CM | POA: Diagnosis not present

## 2019-05-18 DIAGNOSIS — R0602 Shortness of breath: Secondary | ICD-10-CM | POA: Diagnosis not present

## 2019-05-26 DIAGNOSIS — I5022 Chronic systolic (congestive) heart failure: Secondary | ICD-10-CM | POA: Diagnosis not present

## 2019-05-26 DIAGNOSIS — I714 Abdominal aortic aneurysm, without rupture: Secondary | ICD-10-CM | POA: Diagnosis not present

## 2019-05-26 DIAGNOSIS — Z79899 Other long term (current) drug therapy: Secondary | ICD-10-CM | POA: Diagnosis not present

## 2019-05-26 DIAGNOSIS — I10 Essential (primary) hypertension: Secondary | ICD-10-CM | POA: Diagnosis not present

## 2019-05-26 DIAGNOSIS — E119 Type 2 diabetes mellitus without complications: Secondary | ICD-10-CM | POA: Diagnosis not present

## 2019-05-26 DIAGNOSIS — I480 Paroxysmal atrial fibrillation: Secondary | ICD-10-CM | POA: Diagnosis not present

## 2019-05-26 DIAGNOSIS — E7800 Pure hypercholesterolemia, unspecified: Secondary | ICD-10-CM | POA: Diagnosis not present

## 2019-06-13 DIAGNOSIS — S72111D Displaced fracture of greater trochanter of right femur, subsequent encounter for closed fracture with routine healing: Secondary | ICD-10-CM | POA: Diagnosis not present

## 2019-06-13 DIAGNOSIS — I5022 Chronic systolic (congestive) heart failure: Secondary | ICD-10-CM | POA: Diagnosis not present

## 2019-06-13 DIAGNOSIS — F329 Major depressive disorder, single episode, unspecified: Secondary | ICD-10-CM | POA: Diagnosis not present

## 2019-06-13 DIAGNOSIS — I2510 Atherosclerotic heart disease of native coronary artery without angina pectoris: Secondary | ICD-10-CM | POA: Diagnosis not present

## 2019-06-13 DIAGNOSIS — I482 Chronic atrial fibrillation, unspecified: Secondary | ICD-10-CM | POA: Diagnosis not present

## 2019-06-13 DIAGNOSIS — R2689 Other abnormalities of gait and mobility: Secondary | ICD-10-CM | POA: Diagnosis not present

## 2019-06-13 DIAGNOSIS — I110 Hypertensive heart disease with heart failure: Secondary | ICD-10-CM | POA: Diagnosis not present

## 2019-06-13 DIAGNOSIS — F419 Anxiety disorder, unspecified: Secondary | ICD-10-CM | POA: Diagnosis not present

## 2019-06-13 DIAGNOSIS — E119 Type 2 diabetes mellitus without complications: Secondary | ICD-10-CM | POA: Diagnosis not present

## 2019-06-13 DIAGNOSIS — M160 Bilateral primary osteoarthritis of hip: Secondary | ICD-10-CM | POA: Diagnosis not present

## 2019-06-21 DIAGNOSIS — I5022 Chronic systolic (congestive) heart failure: Secondary | ICD-10-CM | POA: Diagnosis not present

## 2019-07-04 DIAGNOSIS — I482 Chronic atrial fibrillation, unspecified: Secondary | ICD-10-CM | POA: Diagnosis not present

## 2019-07-04 DIAGNOSIS — I110 Hypertensive heart disease with heart failure: Secondary | ICD-10-CM | POA: Diagnosis not present

## 2019-07-04 DIAGNOSIS — I2510 Atherosclerotic heart disease of native coronary artery without angina pectoris: Secondary | ICD-10-CM | POA: Diagnosis not present

## 2019-07-04 DIAGNOSIS — R2689 Other abnormalities of gait and mobility: Secondary | ICD-10-CM | POA: Diagnosis not present

## 2019-07-04 DIAGNOSIS — S72111D Displaced fracture of greater trochanter of right femur, subsequent encounter for closed fracture with routine healing: Secondary | ICD-10-CM | POA: Diagnosis not present

## 2019-07-04 DIAGNOSIS — I5022 Chronic systolic (congestive) heart failure: Secondary | ICD-10-CM | POA: Diagnosis not present

## 2019-07-04 DIAGNOSIS — M160 Bilateral primary osteoarthritis of hip: Secondary | ICD-10-CM | POA: Diagnosis not present

## 2019-07-08 DIAGNOSIS — E7800 Pure hypercholesterolemia, unspecified: Secondary | ICD-10-CM | POA: Diagnosis not present

## 2019-07-08 DIAGNOSIS — F419 Anxiety disorder, unspecified: Secondary | ICD-10-CM | POA: Diagnosis not present

## 2019-07-11 DIAGNOSIS — I482 Chronic atrial fibrillation, unspecified: Secondary | ICD-10-CM | POA: Diagnosis not present

## 2019-07-11 DIAGNOSIS — I2510 Atherosclerotic heart disease of native coronary artery without angina pectoris: Secondary | ICD-10-CM | POA: Diagnosis not present

## 2019-07-11 DIAGNOSIS — R2689 Other abnormalities of gait and mobility: Secondary | ICD-10-CM | POA: Diagnosis not present

## 2019-07-11 DIAGNOSIS — I5022 Chronic systolic (congestive) heart failure: Secondary | ICD-10-CM | POA: Diagnosis not present

## 2019-07-11 DIAGNOSIS — F419 Anxiety disorder, unspecified: Secondary | ICD-10-CM | POA: Diagnosis not present

## 2019-07-11 DIAGNOSIS — I110 Hypertensive heart disease with heart failure: Secondary | ICD-10-CM | POA: Diagnosis not present

## 2019-07-11 DIAGNOSIS — E119 Type 2 diabetes mellitus without complications: Secondary | ICD-10-CM | POA: Diagnosis not present

## 2019-07-11 DIAGNOSIS — M160 Bilateral primary osteoarthritis of hip: Secondary | ICD-10-CM | POA: Diagnosis not present

## 2019-07-11 DIAGNOSIS — F329 Major depressive disorder, single episode, unspecified: Secondary | ICD-10-CM | POA: Diagnosis not present

## 2019-07-11 DIAGNOSIS — S72111D Displaced fracture of greater trochanter of right femur, subsequent encounter for closed fracture with routine healing: Secondary | ICD-10-CM | POA: Diagnosis not present

## 2019-07-13 DIAGNOSIS — R0781 Pleurodynia: Secondary | ICD-10-CM | POA: Diagnosis not present

## 2019-07-13 DIAGNOSIS — W01190A Fall on same level from slipping, tripping and stumbling with subsequent striking against furniture, initial encounter: Secondary | ICD-10-CM | POA: Diagnosis not present

## 2019-07-13 DIAGNOSIS — S2241XA Multiple fractures of ribs, right side, initial encounter for closed fracture: Secondary | ICD-10-CM | POA: Diagnosis not present

## 2019-08-18 DIAGNOSIS — M545 Low back pain: Secondary | ICD-10-CM | POA: Diagnosis not present

## 2019-08-31 ENCOUNTER — Encounter (INDEPENDENT_AMBULATORY_CARE_PROVIDER_SITE_OTHER): Payer: PPO

## 2019-08-31 ENCOUNTER — Ambulatory Visit (INDEPENDENT_AMBULATORY_CARE_PROVIDER_SITE_OTHER): Payer: PPO | Admitting: Nurse Practitioner

## 2019-09-06 DIAGNOSIS — R350 Frequency of micturition: Secondary | ICD-10-CM | POA: Diagnosis not present

## 2019-09-06 DIAGNOSIS — R8290 Unspecified abnormal findings in urine: Secondary | ICD-10-CM | POA: Diagnosis not present

## 2019-09-09 DIAGNOSIS — M5431 Sciatica, right side: Secondary | ICD-10-CM | POA: Diagnosis not present

## 2019-09-09 DIAGNOSIS — E119 Type 2 diabetes mellitus without complications: Secondary | ICD-10-CM | POA: Diagnosis not present

## 2019-09-09 DIAGNOSIS — I5022 Chronic systolic (congestive) heart failure: Secondary | ICD-10-CM | POA: Diagnosis not present

## 2019-09-09 DIAGNOSIS — R54 Age-related physical debility: Secondary | ICD-10-CM | POA: Diagnosis not present

## 2019-09-09 DIAGNOSIS — I482 Chronic atrial fibrillation, unspecified: Secondary | ICD-10-CM | POA: Diagnosis not present

## 2019-09-09 DIAGNOSIS — M160 Bilateral primary osteoarthritis of hip: Secondary | ICD-10-CM | POA: Diagnosis not present

## 2019-09-09 DIAGNOSIS — I110 Hypertensive heart disease with heart failure: Secondary | ICD-10-CM | POA: Diagnosis not present

## 2019-09-09 DIAGNOSIS — I2510 Atherosclerotic heart disease of native coronary artery without angina pectoris: Secondary | ICD-10-CM | POA: Diagnosis not present

## 2019-09-09 DIAGNOSIS — R8290 Unspecified abnormal findings in urine: Secondary | ICD-10-CM | POA: Diagnosis not present

## 2019-09-09 DIAGNOSIS — M545 Low back pain: Secondary | ICD-10-CM | POA: Diagnosis not present

## 2019-09-19 DIAGNOSIS — M160 Bilateral primary osteoarthritis of hip: Secondary | ICD-10-CM | POA: Diagnosis not present

## 2019-09-19 DIAGNOSIS — R8290 Unspecified abnormal findings in urine: Secondary | ICD-10-CM | POA: Diagnosis not present

## 2019-09-19 DIAGNOSIS — I5022 Chronic systolic (congestive) heart failure: Secondary | ICD-10-CM | POA: Diagnosis not present

## 2019-09-19 DIAGNOSIS — M545 Low back pain: Secondary | ICD-10-CM | POA: Diagnosis not present

## 2019-09-19 DIAGNOSIS — I110 Hypertensive heart disease with heart failure: Secondary | ICD-10-CM | POA: Diagnosis not present

## 2019-09-19 DIAGNOSIS — R54 Age-related physical debility: Secondary | ICD-10-CM | POA: Diagnosis not present

## 2019-09-19 DIAGNOSIS — M5431 Sciatica, right side: Secondary | ICD-10-CM | POA: Diagnosis not present

## 2019-09-20 DIAGNOSIS — I480 Paroxysmal atrial fibrillation: Secondary | ICD-10-CM | POA: Diagnosis not present

## 2019-10-11 DIAGNOSIS — M5431 Sciatica, right side: Secondary | ICD-10-CM | POA: Diagnosis not present

## 2019-10-11 DIAGNOSIS — I2510 Atherosclerotic heart disease of native coronary artery without angina pectoris: Secondary | ICD-10-CM | POA: Diagnosis not present

## 2019-10-11 DIAGNOSIS — I482 Chronic atrial fibrillation, unspecified: Secondary | ICD-10-CM | POA: Diagnosis not present

## 2019-10-11 DIAGNOSIS — I5022 Chronic systolic (congestive) heart failure: Secondary | ICD-10-CM | POA: Diagnosis not present

## 2019-10-11 DIAGNOSIS — M545 Low back pain: Secondary | ICD-10-CM | POA: Diagnosis not present

## 2019-10-11 DIAGNOSIS — I110 Hypertensive heart disease with heart failure: Secondary | ICD-10-CM | POA: Diagnosis not present

## 2019-10-11 DIAGNOSIS — E119 Type 2 diabetes mellitus without complications: Secondary | ICD-10-CM | POA: Diagnosis not present

## 2019-10-11 DIAGNOSIS — R8290 Unspecified abnormal findings in urine: Secondary | ICD-10-CM | POA: Diagnosis not present

## 2019-10-11 DIAGNOSIS — M160 Bilateral primary osteoarthritis of hip: Secondary | ICD-10-CM | POA: Diagnosis not present

## 2019-10-11 DIAGNOSIS — R54 Age-related physical debility: Secondary | ICD-10-CM | POA: Diagnosis not present

## 2019-11-18 DIAGNOSIS — E7800 Pure hypercholesterolemia, unspecified: Secondary | ICD-10-CM | POA: Diagnosis not present

## 2019-11-18 DIAGNOSIS — E118 Type 2 diabetes mellitus with unspecified complications: Secondary | ICD-10-CM | POA: Diagnosis not present

## 2019-11-18 DIAGNOSIS — Z79899 Other long term (current) drug therapy: Secondary | ICD-10-CM | POA: Diagnosis not present

## 2019-11-25 DIAGNOSIS — Z1331 Encounter for screening for depression: Secondary | ICD-10-CM | POA: Diagnosis not present

## 2019-11-25 DIAGNOSIS — Z0000 Encounter for general adult medical examination without abnormal findings: Secondary | ICD-10-CM | POA: Diagnosis not present

## 2019-12-20 DIAGNOSIS — I5022 Chronic systolic (congestive) heart failure: Secondary | ICD-10-CM | POA: Diagnosis not present

## 2020-03-20 DIAGNOSIS — I5022 Chronic systolic (congestive) heart failure: Secondary | ICD-10-CM | POA: Diagnosis not present

## 2020-05-21 DIAGNOSIS — Z79899 Other long term (current) drug therapy: Secondary | ICD-10-CM | POA: Diagnosis not present

## 2020-05-21 DIAGNOSIS — E7800 Pure hypercholesterolemia, unspecified: Secondary | ICD-10-CM | POA: Diagnosis not present

## 2020-05-21 DIAGNOSIS — E118 Type 2 diabetes mellitus with unspecified complications: Secondary | ICD-10-CM | POA: Diagnosis not present

## 2020-05-28 DIAGNOSIS — N1831 Chronic kidney disease, stage 3a: Secondary | ICD-10-CM | POA: Diagnosis not present

## 2020-05-28 DIAGNOSIS — I5022 Chronic systolic (congestive) heart failure: Secondary | ICD-10-CM | POA: Diagnosis not present

## 2020-05-28 DIAGNOSIS — F419 Anxiety disorder, unspecified: Secondary | ICD-10-CM | POA: Diagnosis not present

## 2020-05-28 DIAGNOSIS — I152 Hypertension secondary to endocrine disorders: Secondary | ICD-10-CM | POA: Diagnosis not present

## 2020-05-28 DIAGNOSIS — I714 Abdominal aortic aneurysm, without rupture: Secondary | ICD-10-CM | POA: Diagnosis not present

## 2020-05-28 DIAGNOSIS — Z23 Encounter for immunization: Secondary | ICD-10-CM | POA: Diagnosis not present

## 2020-05-28 DIAGNOSIS — E7800 Pure hypercholesterolemia, unspecified: Secondary | ICD-10-CM | POA: Diagnosis not present

## 2020-05-28 DIAGNOSIS — I480 Paroxysmal atrial fibrillation: Secondary | ICD-10-CM | POA: Diagnosis not present

## 2020-05-28 DIAGNOSIS — E1159 Type 2 diabetes mellitus with other circulatory complications: Secondary | ICD-10-CM | POA: Diagnosis not present

## 2020-06-19 DIAGNOSIS — I5022 Chronic systolic (congestive) heart failure: Secondary | ICD-10-CM | POA: Diagnosis not present

## 2020-09-25 DIAGNOSIS — I5022 Chronic systolic (congestive) heart failure: Secondary | ICD-10-CM | POA: Diagnosis not present

## 2020-11-27 DIAGNOSIS — I152 Hypertension secondary to endocrine disorders: Secondary | ICD-10-CM | POA: Diagnosis not present

## 2020-11-27 DIAGNOSIS — E1159 Type 2 diabetes mellitus with other circulatory complications: Secondary | ICD-10-CM | POA: Diagnosis not present

## 2020-11-27 DIAGNOSIS — N1831 Chronic kidney disease, stage 3a: Secondary | ICD-10-CM | POA: Diagnosis not present

## 2020-11-27 DIAGNOSIS — E7800 Pure hypercholesterolemia, unspecified: Secondary | ICD-10-CM | POA: Diagnosis not present

## 2020-12-03 DIAGNOSIS — Z0000 Encounter for general adult medical examination without abnormal findings: Secondary | ICD-10-CM | POA: Diagnosis not present

## 2020-12-03 DIAGNOSIS — Z1331 Encounter for screening for depression: Secondary | ICD-10-CM | POA: Diagnosis not present

## 2020-12-03 DIAGNOSIS — E7800 Pure hypercholesterolemia, unspecified: Secondary | ICD-10-CM | POA: Diagnosis not present

## 2020-12-25 DIAGNOSIS — I5022 Chronic systolic (congestive) heart failure: Secondary | ICD-10-CM | POA: Diagnosis not present

## 2021-01-04 IMAGING — CR DG HIP (WITH OR WITHOUT PELVIS) 2-3V*R*
3 series · 3 of 3 positions shown · non-contrast
Comparison: None.

CLINICAL DATA: Fall on right hip

EXAM:
DG HIP (WITH OR WITHOUT PELVIS) 2-3V RIGHT

[pelvis ap]
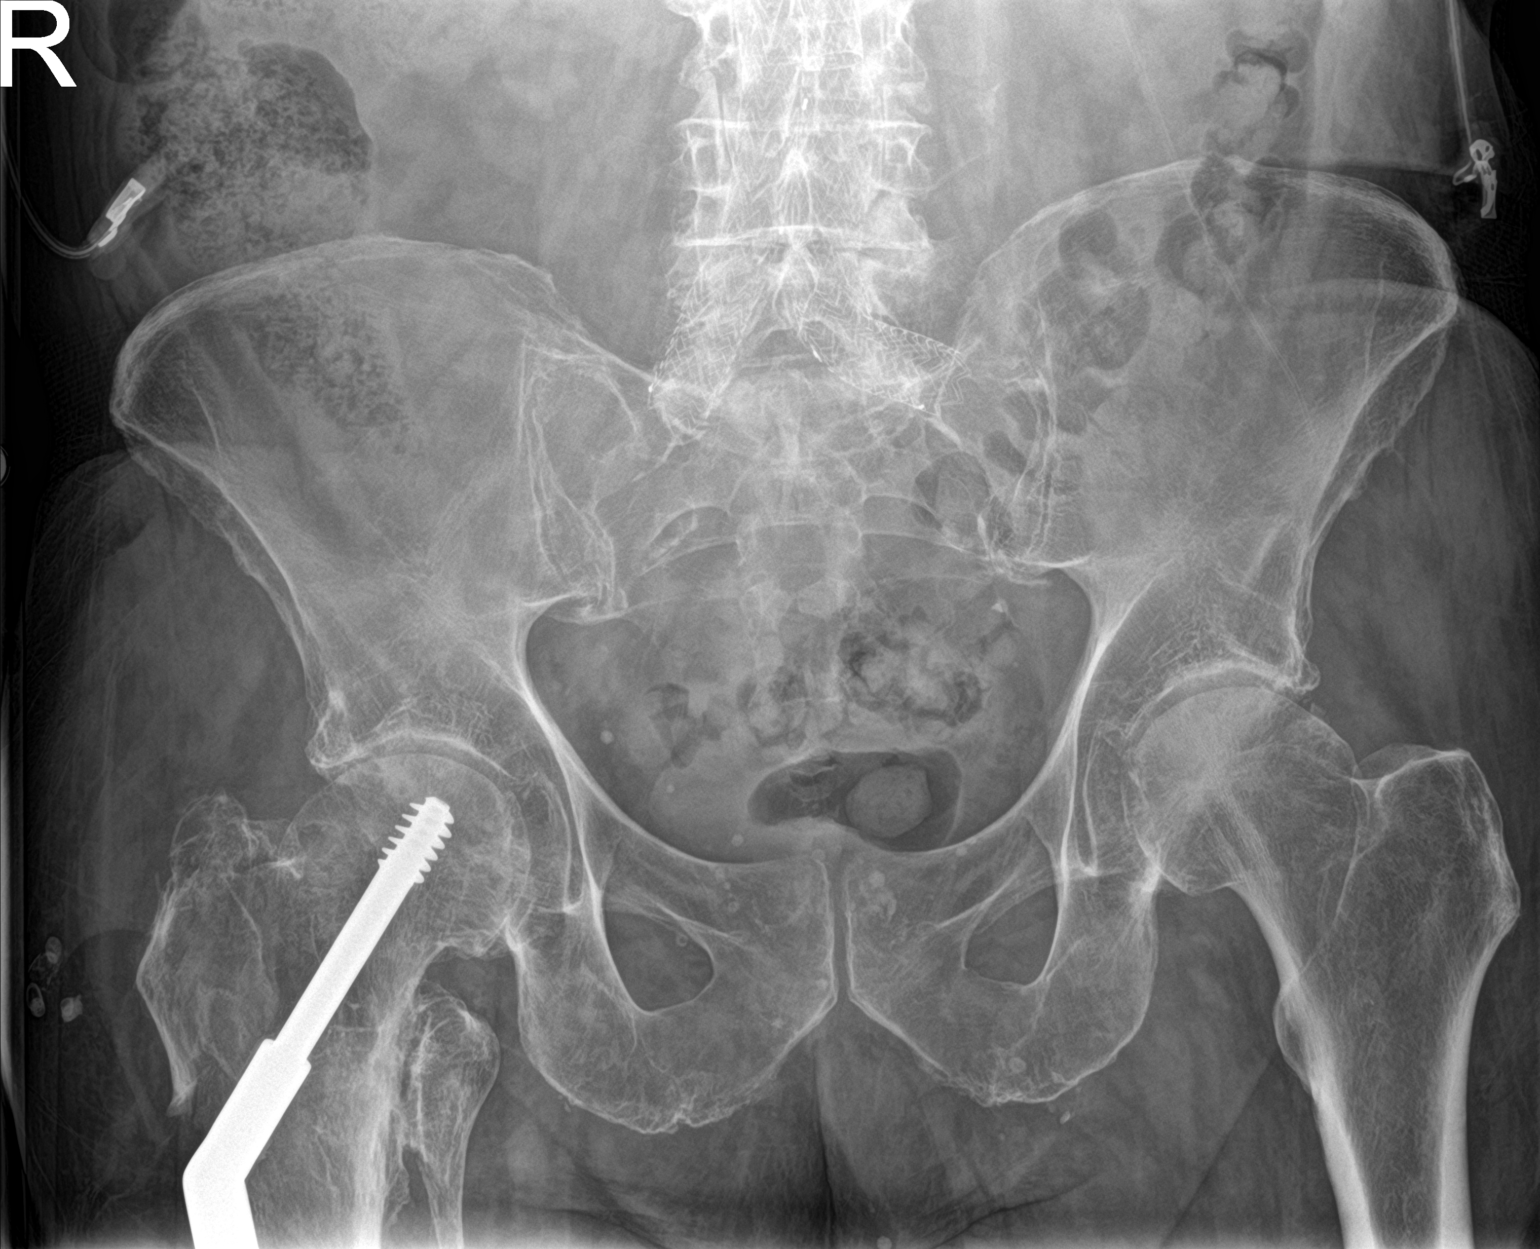

[hip lat]
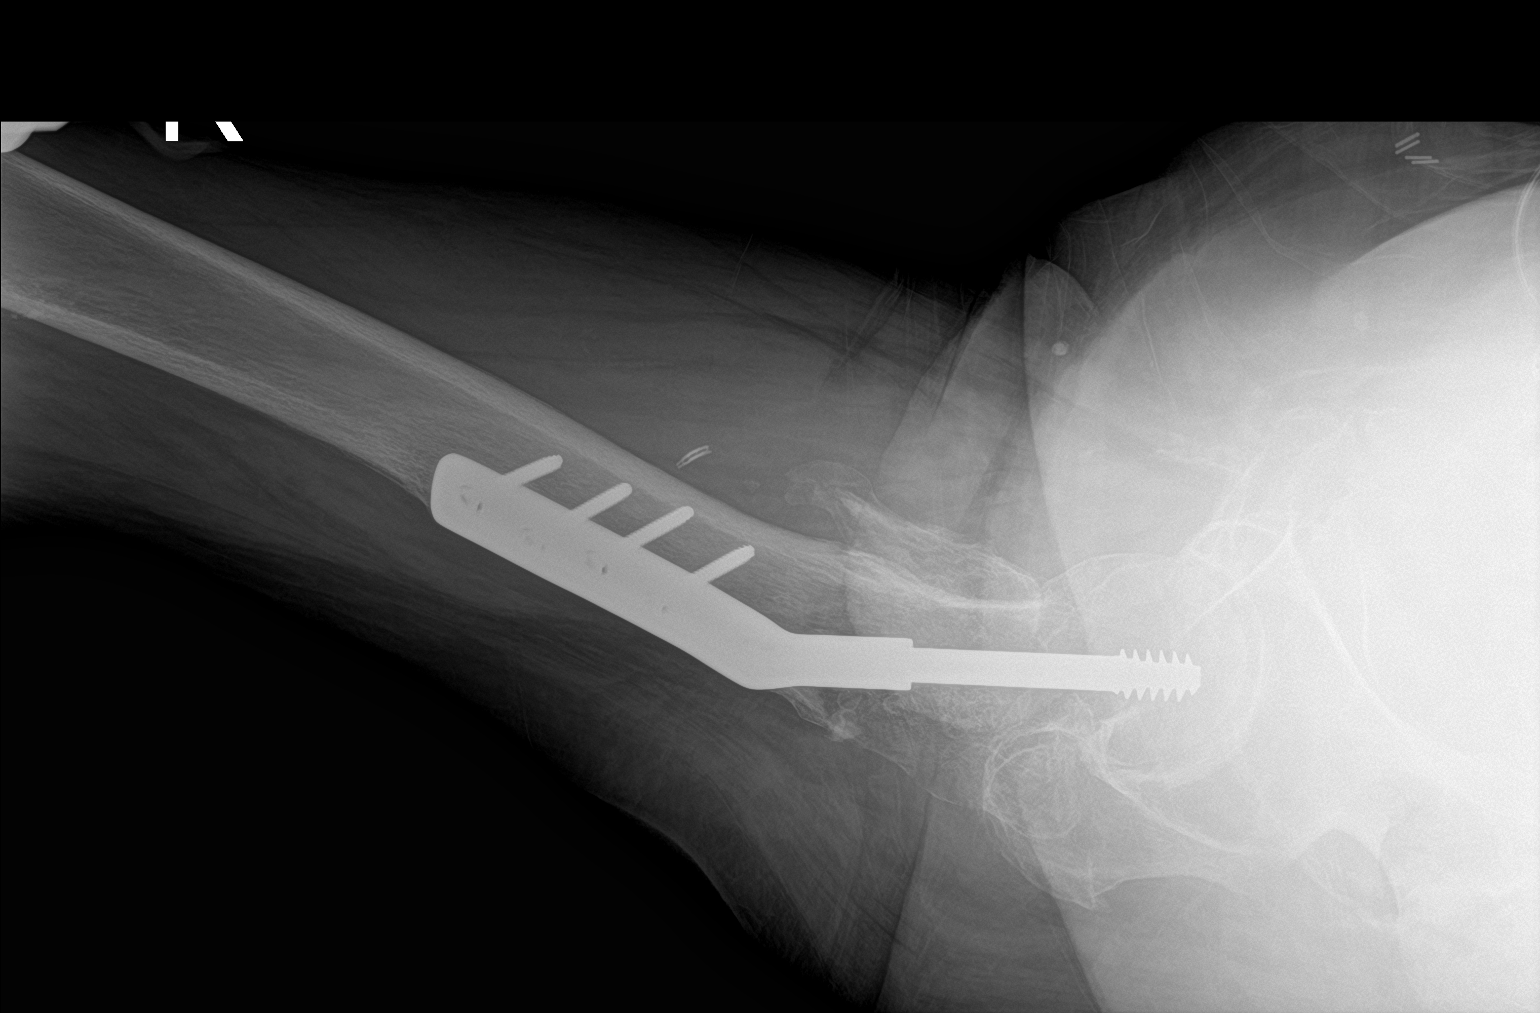

[hip ap]
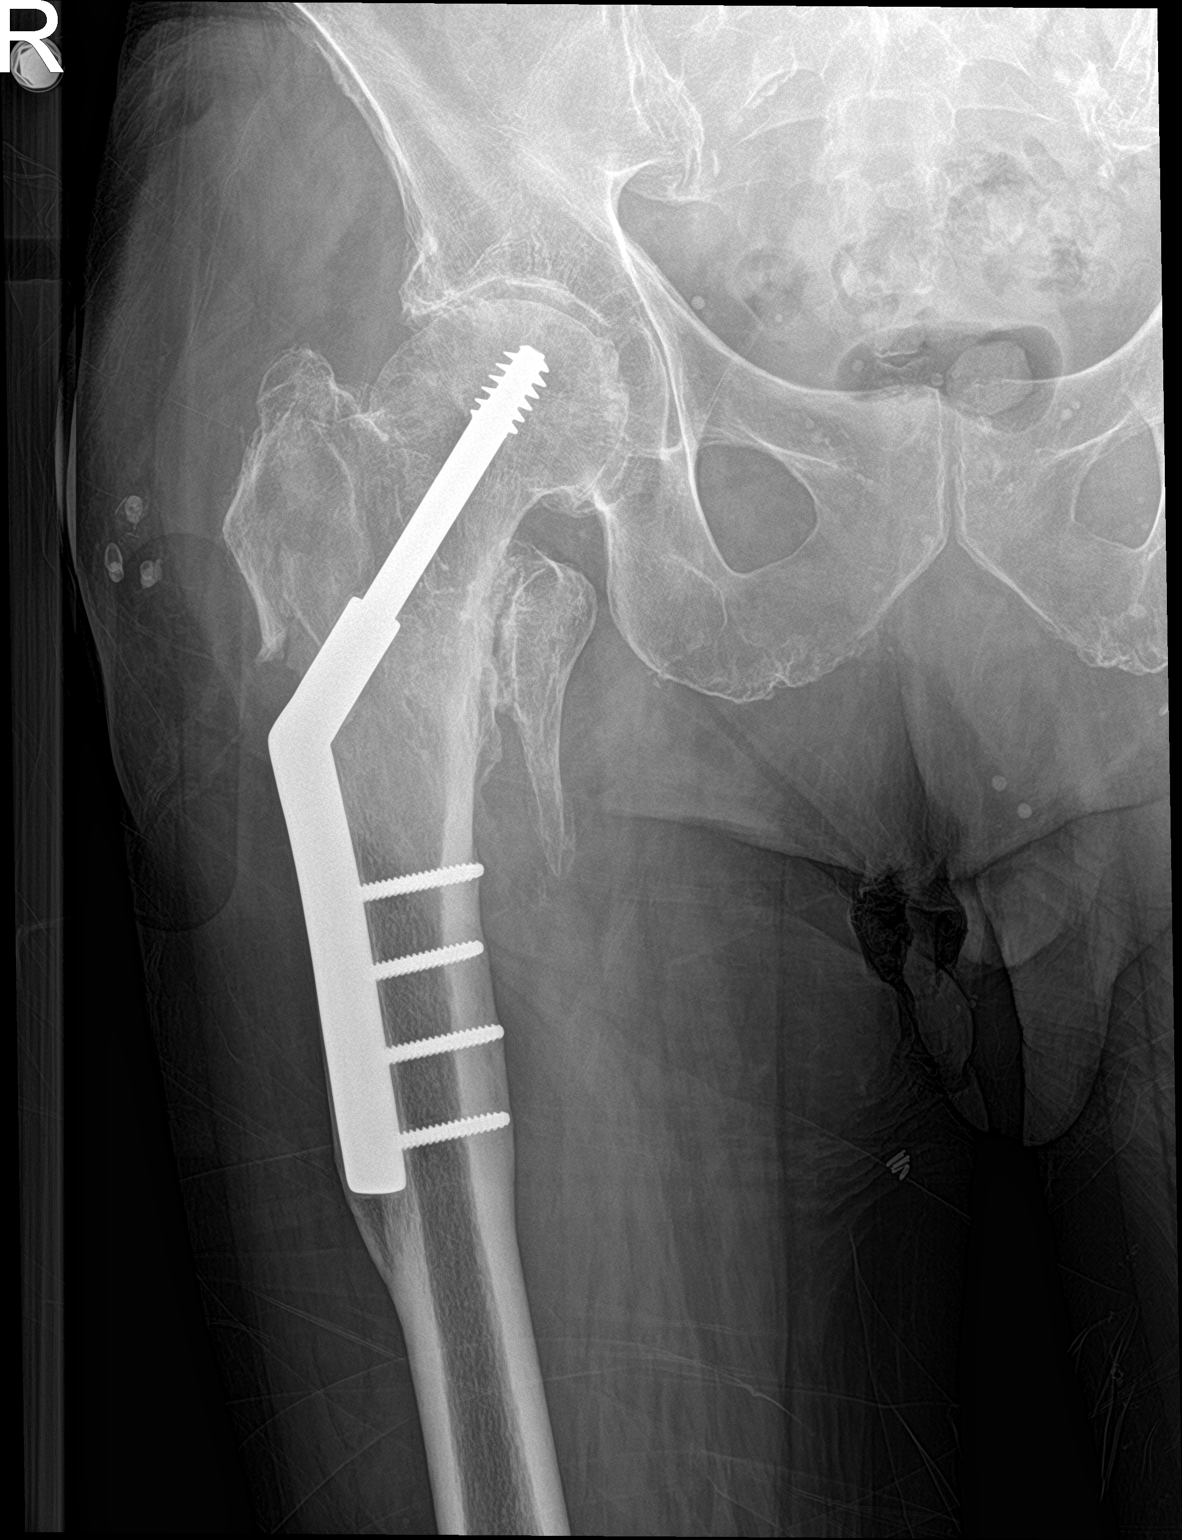

[3 of 3 positions shown; findings below may reference images not displayed]

FINDINGS: The patient has had a prior plate and nail fixation of the right
femur. There appears to be acute comminuted fracture of the greater
trochanter which is mildly impacted. Healed fracture deformity of
the lesser trochanter and femoral head neck junction is seen. There
is moderate bilateral hip osteoarthritis with superior joint space
loss and marginal osteophyte formation. Diffuse osteopenia. Vascular
stent seen within the lower abdomen.
IMPRESSION: acute comminuted mildly impacted fracture of the greater trochanter.

## 2021-01-04 IMAGING — DX DG CHEST 1V PORT
1 series · 1 of 1 positions shown · non-contrast
Comparison: Chest radiograph 11/14/2011

CLINICAL DATA: Preop. Fall, hip fracture.

EXAM:
PORTABLE CHEST 1 VIEW

[chest ap]
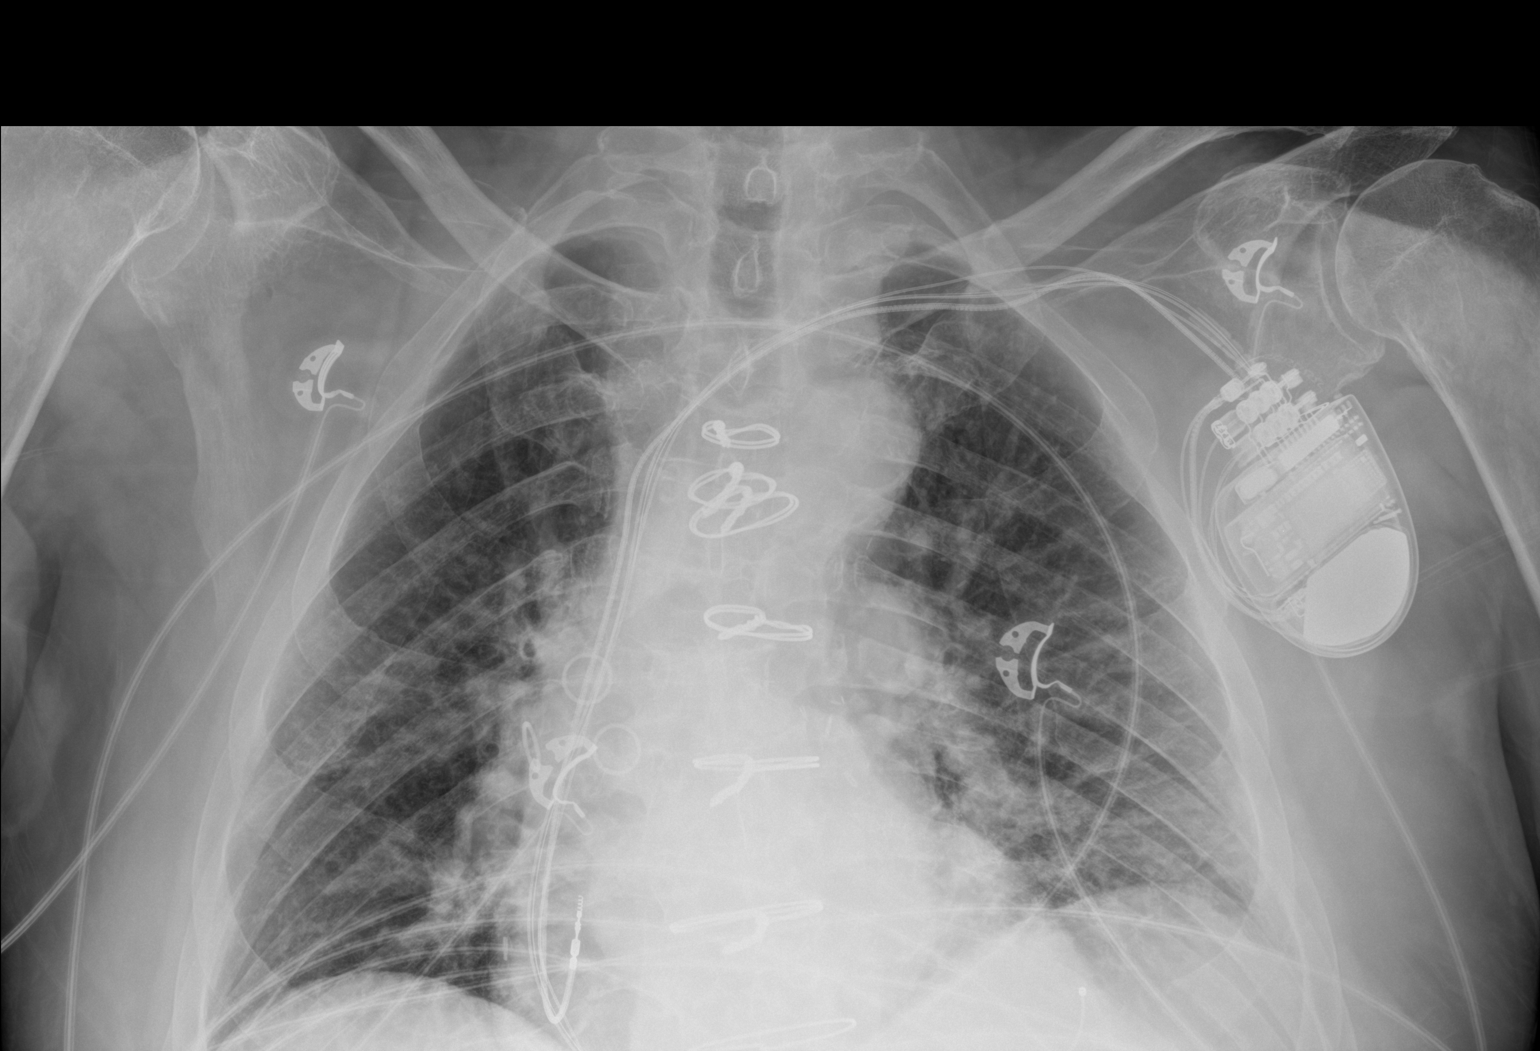

[1 of 1 positions shown; findings below may reference images not displayed]

FINDINGS: Low lung volumes. Multi lead left-sided pacemaker in place. Post
median sternotomy and CABG. Upper normal heart size with mild aortic
tortuosity. Aortic atherosclerosis. Central peribronchial
thickening. Streaky left lung base opacities favor atelectasis. No
confluent airspace disease. No large pleural effusion or
pneumothorax. No acute osseous abnormalities are seen.
IMPRESSION: 1. Low lung volumes with central peribronchial thickening.
2. Post CABG with left-sided pacemaker in place.
3.  Aortic Atherosclerosis ([id number]).

## 2021-03-18 DIAGNOSIS — R7303 Prediabetes: Secondary | ICD-10-CM | POA: Diagnosis not present

## 2021-03-18 DIAGNOSIS — B351 Tinea unguium: Secondary | ICD-10-CM | POA: Diagnosis not present

## 2021-03-18 DIAGNOSIS — M79675 Pain in left toe(s): Secondary | ICD-10-CM | POA: Diagnosis not present

## 2021-03-18 DIAGNOSIS — M79674 Pain in right toe(s): Secondary | ICD-10-CM | POA: Diagnosis not present

## 2021-03-18 DIAGNOSIS — L600 Ingrowing nail: Secondary | ICD-10-CM | POA: Diagnosis not present

## 2021-04-03 DIAGNOSIS — I5022 Chronic systolic (congestive) heart failure: Secondary | ICD-10-CM | POA: Diagnosis not present

## 2021-04-05 DIAGNOSIS — M79675 Pain in left toe(s): Secondary | ICD-10-CM | POA: Diagnosis not present

## 2021-04-05 DIAGNOSIS — L851 Acquired keratosis [keratoderma] palmaris et plantaris: Secondary | ICD-10-CM | POA: Diagnosis not present

## 2021-04-05 DIAGNOSIS — M79674 Pain in right toe(s): Secondary | ICD-10-CM | POA: Diagnosis not present

## 2021-04-05 DIAGNOSIS — R7303 Prediabetes: Secondary | ICD-10-CM | POA: Diagnosis not present

## 2021-04-05 DIAGNOSIS — B351 Tinea unguium: Secondary | ICD-10-CM | POA: Diagnosis not present

## 2021-04-05 DIAGNOSIS — L600 Ingrowing nail: Secondary | ICD-10-CM | POA: Diagnosis not present

## 2021-04-05 DIAGNOSIS — L84 Corns and callosities: Secondary | ICD-10-CM | POA: Diagnosis not present

## 2021-06-13 DIAGNOSIS — Z79899 Other long term (current) drug therapy: Secondary | ICD-10-CM | POA: Diagnosis not present

## 2021-06-13 DIAGNOSIS — N1831 Chronic kidney disease, stage 3a: Secondary | ICD-10-CM | POA: Diagnosis not present

## 2021-06-13 DIAGNOSIS — E1159 Type 2 diabetes mellitus with other circulatory complications: Secondary | ICD-10-CM | POA: Diagnosis not present

## 2021-06-13 DIAGNOSIS — I152 Hypertension secondary to endocrine disorders: Secondary | ICD-10-CM | POA: Diagnosis not present

## 2021-06-13 DIAGNOSIS — E7800 Pure hypercholesterolemia, unspecified: Secondary | ICD-10-CM | POA: Diagnosis not present

## 2021-06-14 DIAGNOSIS — E7800 Pure hypercholesterolemia, unspecified: Secondary | ICD-10-CM | POA: Diagnosis not present

## 2021-06-14 DIAGNOSIS — I152 Hypertension secondary to endocrine disorders: Secondary | ICD-10-CM | POA: Diagnosis not present

## 2021-06-14 DIAGNOSIS — N1831 Chronic kidney disease, stage 3a: Secondary | ICD-10-CM | POA: Diagnosis not present

## 2021-06-14 DIAGNOSIS — Z79899 Other long term (current) drug therapy: Secondary | ICD-10-CM | POA: Diagnosis not present

## 2021-06-14 DIAGNOSIS — E1159 Type 2 diabetes mellitus with other circulatory complications: Secondary | ICD-10-CM | POA: Diagnosis not present

## 2021-06-24 DIAGNOSIS — I714 Abdominal aortic aneurysm, without rupture, unspecified: Secondary | ICD-10-CM | POA: Diagnosis not present

## 2021-06-24 DIAGNOSIS — E1159 Type 2 diabetes mellitus with other circulatory complications: Secondary | ICD-10-CM | POA: Diagnosis not present

## 2021-06-24 DIAGNOSIS — Z79899 Other long term (current) drug therapy: Secondary | ICD-10-CM | POA: Diagnosis not present

## 2021-06-24 DIAGNOSIS — I5022 Chronic systolic (congestive) heart failure: Secondary | ICD-10-CM | POA: Diagnosis not present

## 2021-06-24 DIAGNOSIS — I480 Paroxysmal atrial fibrillation: Secondary | ICD-10-CM | POA: Diagnosis not present

## 2021-06-24 DIAGNOSIS — N1831 Chronic kidney disease, stage 3a: Secondary | ICD-10-CM | POA: Diagnosis not present

## 2021-06-24 DIAGNOSIS — I152 Hypertension secondary to endocrine disorders: Secondary | ICD-10-CM | POA: Diagnosis not present

## 2021-06-24 DIAGNOSIS — E7800 Pure hypercholesterolemia, unspecified: Secondary | ICD-10-CM | POA: Diagnosis not present

## 2021-07-16 DIAGNOSIS — R001 Bradycardia, unspecified: Secondary | ICD-10-CM | POA: Diagnosis not present

## 2021-07-16 DIAGNOSIS — R55 Syncope and collapse: Secondary | ICD-10-CM | POA: Diagnosis not present

## 2021-10-07 ENCOUNTER — Ambulatory Visit (INDEPENDENT_AMBULATORY_CARE_PROVIDER_SITE_OTHER): Payer: PPO | Admitting: Dermatology

## 2021-10-07 DIAGNOSIS — L820 Inflamed seborrheic keratosis: Secondary | ICD-10-CM

## 2021-10-07 DIAGNOSIS — L578 Other skin changes due to chronic exposure to nonionizing radiation: Secondary | ICD-10-CM

## 2021-10-07 DIAGNOSIS — C44219 Basal cell carcinoma of skin of left ear and external auricular canal: Secondary | ICD-10-CM

## 2021-10-07 DIAGNOSIS — C4491 Basal cell carcinoma of skin, unspecified: Secondary | ICD-10-CM

## 2021-10-07 DIAGNOSIS — D492 Neoplasm of unspecified behavior of bone, soft tissue, and skin: Secondary | ICD-10-CM

## 2021-10-07 DIAGNOSIS — L570 Actinic keratosis: Secondary | ICD-10-CM | POA: Diagnosis not present

## 2021-10-07 HISTORY — DX: Basal cell carcinoma of skin, unspecified: C44.91

## 2021-10-07 NOTE — Patient Instructions (Addendum)
Wound Care Instructions ? ?Cleanse wound gently with soap and water once a day then pat dry with clean gauze. Apply a thing coat of Petrolatum (petroleum jelly, "Vaseline") over the wound (unless you have an allergy to this). We recommend that you use a new, sterile tube of Vaseline. Do not pick or remove scabs. Do not remove the yellow or white "healing tissue" from the base of the wound. ? ?Cover the wound with fresh, clean, nonstick gauze and secure with paper tape. You may use Band-Aids in place of gauze and tape if the would is small enough, but would recommend trimming much of the tape off as there is often too much. Sometimes Band-Aids can irritate the skin. ? ?You should call the office for your biopsy report after 1 week if you have not already been contacted. ? ?If you experience any problems, such as abnormal amounts of bleeding, swelling, significant bruising, significant pain, or evidence of infection, please call the office immediately. ? ?FOR ADULT SURGERY PATIENTS: If you need something for pain relief you may take 1 extra strength Tylenol (acetaminophen) AND 2 Ibuprofen ('200mg'$  each) together every 4 hours as needed for pain. (do not take these if you are allergic to them or if you have a reason you should not take them.) Typically, you may only need pain medication for 1 to 3 days.  ? ? ? ? ? ?Cryotherapy Aftercare ? ?Wash gently with soap and water everyday.   ?Apply Vaseline and Band-Aid daily until healed.  ? ? ? ? ?If You Need Anything After Your Visit ? ?If you have any questions or concerns for your doctor, please call our main line at [phone] and press option 4 to reach your doctor's medical assistant. If no one answers, please leave a voicemail as directed and we will return your call as soon as possible. Messages left after 4 pm will be answered the following business day.  ? ?You may also send [place] a message via MyChart. We typically respond to MyChart messages within 1-2 business  days. ? ?For prescription refills, please ask your pharmacy to contact our office. Our fax number is [phone]. ? ?If you have an urgent issue when the clinic is closed that cannot wait until the next business day, you can page your doctor at the number below.   ? ?Please note that while we do our best to be available for urgent issues outside of office hours, we are not available 24/7.  ? ?If you have an urgent issue and are unable to reach [place], you may choose to seek medical care at your doctor's office, retail clinic, urgent care center, or emergency room. ? ?If you have a medical emergency, please immediately call 911 or go to the emergency department. ? ?Pager Numbers ? ?- Dr. Nehemiah Massed: [phone] ? ?- Dr. Laurence Ferrari: [phone] ? ?- Dr. Nicole Kindred: [phone] ? ?In the event of inclement weather, please call our main line at [phone] for an update on the status of any delays or closures. ? ?Dermatology Medication Tips: ?Please keep the boxes that topical medications come in in order to help keep track of the instructions about where and how to use these. Pharmacies typically print the medication instructions only on the boxes and not directly on the medication tubes.  ? ?If your medication is too expensive, please contact our office at [phone] option 4 or send [place] a message through [facility].  ? ?We are unable to tell what your co-pay for medications will  be in advance as this is different depending on your insurance coverage. However, we may be able to find a substitute medication at lower cost or fill out paperwork to get insurance to cover a needed medication.  ? ?If a prior authorization is required to get your medication covered by your insurance company, please allow [place] 1-2 business days to complete this process. ? ?Drug prices often vary depending on where the prescription is filled and some pharmacies may offer cheaper prices. ? ?The website www.goodrx.com contains coupons for medications through  different pharmacies. The prices here do not account for what the cost may be with help from insurance (it may be cheaper with your insurance), but the website can give you the price if you did not use any insurance.  ?- You can print the associated coupon and take it with your prescription to the pharmacy.  ?- You may also stop by our office during regular business hours and pick up a GoodRx coupon card.  ?- If you need your prescription sent electronically to a different pharmacy, notify our office through [facility] or by phone at [phone] option 4. ? ? ? ? ?Si Usted Necesita Algo Despu?s de Su Visita ? ?Tambi?n puede enviarnos un mensaje a trav?s de MyChart. Por lo general respondemos a los mensajes de MyChart en el transcurso de 1 a 2 d?as h?biles. ? ?Para renovar recetas, por favor pida a su farmacia que se ponga en contacto con nuestra oficina. Nuestro n?mero de fax es el [phone]. ? ?Si tiene un asunto urgente cuando la cl?nica est? cerrada y que no puede esperar hasta el siguiente d?a h?bil, puede llamar/localizar a su doctor(a) al n?mero que aparece a continuaci?n.  ? ?Por favor, tenga en cuenta que aunque hacemos todo lo posible para estar disponibles para asuntos urgentes fuera del horario de oficina, no estamos disponibles las 24 horas del d?a, los 7 d?as de la semana.  ? ?Si tiene un problema urgente y no puede comunicarse con nosotros, puede optar por buscar atenci?n m?dica  en el consultorio de su doctor(a), en una cl?nica privada, en un centro de atenci?n urgente o en una sala de emergencias. ? ?Si tiene Engineer, maintenance (IT) m?dica, por favor llame inmediatamente al 911 o vaya a la sala de emergencias. ? ?N?meros de b?per ? ?- Dr. Nehemiah Massed: [phone] ? ?- Dra. Moye: [phone] ? ?- Dra. Nicole Kindred: [phone] ? ?En caso de inclemencias del tiempo, por favor llame a nuestra l?nea principal al [phone] para una actualizaci?n sobre el estado de cualquier retraso o cierre. ? ?Consejos  para la medicaci?n en dermatolog?a: ?Por favor, guarde las cajas en las que vienen los medicamentos de uso t?pico para ayudarle a seguir las instrucciones sobre d?nde y c?mo usarlos. Las farmacias generalmente imprimen las instrucciones del medicamento s?lo en las cajas y no directamente en los tubos del [place].  ? ?Si su medicamento es muy caro, por favor, p?ngase en contacto con Zigmund Daniel llamando al [phone] y presione la opci?n 4 o env?enos un mensaje a trav?s de MyChart.  ? ?No podemos decirle cu?l ser? su copago por los medicamentos por adelantado ya que esto es diferente dependiendo de la cobertura de su seguro. Sin embargo, es posible que podamos encontrar un medicamento sustituto a Electrical engineer un formulario para que el seguro cubra el medicamento que se considera necesario.  ? ?Si se requiere Ardelia Mems autorizaci?n previa para que su compa??a de seguros [place] su medicamento, por favor perm?tanos de 1  a 2 d?as h?biles para completar este proceso. ? ?Los precios de los medicamentos var?an con frecuencia dependiendo del Environmental consultant de d?nde se surte la receta y alguna farmacias pueden ofrecer precios m?s baratos. ? ?El sitio web www.goodrx.com tiene cupones para medicamentos de Airline pilot. Los precios aqu? no tienen en cuenta lo que podr?a costar con la ayuda del seguro (puede ser m?s barato con su seguro), pero el sitio web puede darle el precio si no utiliz? ning?n seguro.  ?- Puede imprimir el cup?n correspondiente y llevarlo con su receta a la farmacia.  ?- Tambi?n puede pasar por nuestra oficina durante el horario de atenci?n regular y recoger una tarjeta de cupones de GoodRx.  ?- Si necesita que su receta se env?e electr?nicamente a Chiropodist, informe a nuestra oficina a trav?s de MyChart de [place] o por tel?fono llamando al [phone] y presione la opci?n 4.  ?

## 2021-10-07 NOTE — Progress Notes (Signed)
? ?New Patient Visit ? ?Subjective  ?Nathaniel Woods is a 88 y.o. male who presents for the following: Skin Problem (The patient has spots, moles and lesions to be evaluated, some may be new or changing and the patient has concerns that these could be cancer. ). ? ?Son with patient and contributes to history. ? ?The following portions of the chart were reviewed this encounter and updated as appropriate:  ? Tobacco  Allergies  Meds  Problems  Med Hx  Surg Hx  Fam Hx   ?  ?Review of Systems:  No other skin or systemic complaints except as noted in HPI or Assessment and Plan. ? ?Objective  ?Well appearing patient in no apparent distress; mood and affect are within normal limits. ? ?A focused examination was performed including face,scalp,ears. Relevant physical exam findings are noted in the Assessment and Plan. ? ?face x 3 (3) ?Erythematous thin papules/macules with gritty scale.  ? ?left superior ear ?1.2 cm crusted plaque ? ? ? ? ?face,neck x 16 (16) ?Stuck-on, waxy, tan-brown papules --Discussed benign etiology and prognosis.  ? ? ?Assessment & Plan  ?AK (actinic keratosis) (3) ?face x 3 ? ?Actinic keratoses are precancerous spots that appear secondary to cumulative UV radiation exposure/sun exposure over time. They are chronic with expected duration over 1 year. A portion of actinic keratoses will progress to squamous cell carcinoma of the skin. It is not possible to reliably predict which spots will progress to skin cancer and so treatment is recommended to prevent development of skin cancer. ? ?Recommend daily broad spectrum sunscreen SPF 30+ to sun-exposed areas, reapply every 2 hours as needed.  ?Recommend staying in the shade or wearing long sleeves, sun glasses (UVA+UVB protection) and wide brim hats (4-inch brim around the entire circumference of the hat). ?Call for new or changing lesions.  ? ?Destruction of lesion - face x 3 ?Complexity: simple   ?Destruction method: cryotherapy   ?Informed  consent: discussed and consent obtained   ?Timeout:  patient name, date of birth, surgical site, and procedure verified ?Lesion destroyed using liquid nitrogen: Yes   ?Region frozen until ice ball extended beyond lesion: Yes   ?Outcome: patient tolerated procedure well with no complications   ?Post-procedure details: wound care instructions given   ? ?Neoplasm of skin ?left superior ear ? ?Skin / nail biopsy ?Type of biopsy: tangential   ?Informed consent: discussed and consent obtained   ?Timeout: patient name, date of birth, surgical site, and procedure verified   ?Procedure prep:  Patient was prepped and draped in usual sterile fashion ?Prep type:  Isopropyl alcohol ?Anesthesia: the lesion was anesthetized in a standard fashion   ?Anesthetic:  1% lidocaine w/ epinephrine 1-100,000 buffered w/ 8.4% NaHCO3 ?Instrument used: flexible razor blade   ?Hemostasis achieved with: aluminum chloride   ?Outcome: patient tolerated procedure well   ?Post-procedure details: wound care instructions given   ?Additional details:  Petrolatum and a pressure bandage applied ? ?Specimen 1 - Surgical pathology ?Differential Diagnosis: R/O SCC ? ?Check Margins: No ? ?Inflamed seborrheic keratosis (16) ?face,neck x 16 ? ?Reassured benign age-related growth.  Recommend observation.  Discussed cryotherapy if spot(s) become irritated or inflamed.  ? ?Destruction of lesion - face,neck x 16 ?Complexity: simple   ?Destruction method: cryotherapy   ?Informed consent: discussed and consent obtained   ?Timeout:  patient name, date of birth, surgical site, and procedure verified ?Lesion destroyed using liquid nitrogen: Yes   ?Region frozen until ice ball extended beyond lesion:  Yes   ?Outcome: patient tolerated procedure well with no complications   ?Post-procedure details: wound care instructions given   ? ?Actinic Damage ?- chronic, secondary to cumulative UV radiation exposure/sun exposure over time ?- diffuse scaly erythematous macules with  underlying dyspigmentation ?- Recommend daily broad spectrum sunscreen SPF 30+ to sun-exposed areas, reapply every 2 hours as needed.  ?- Recommend staying in the shade or wearing long sleeves, sun glasses (UVA+UVB protection) and wide brim hats (4-inch brim around the entire circumference of the hat). ?- Call for new or changing lesions. ? ?Return if symptoms worsen or fail to improve. ? ?I, Marye Round, CMA, am acting as scribe for Sarina Ser, MD .  ?Documentation: I have reviewed the above documentation for accuracy and completeness, and I agree with the above. ? ?Sarina Ser, MD ? ?

## 2021-10-10 ENCOUNTER — Telehealth: Payer: Self-pay

## 2021-10-10 DIAGNOSIS — H353131 Nonexudative age-related macular degeneration, bilateral, early dry stage: Secondary | ICD-10-CM | POA: Diagnosis not present

## 2021-10-10 NOTE — Telephone Encounter (Signed)
Called pt wife discussed biopsy results. Scheduled surgery appt May 9 at 8:30 ?

## 2021-10-10 NOTE — Telephone Encounter (Signed)
-----   Message from Ralene Bathe, MD sent at 10/08/2021  8:52 PM EDT ----- ?Diagnosis ?Skin , left superior ear ?BASAL CELL CARCINOMA, NODULAR AND INFILTRATIVE PATTERNS, PERIPHERAL AND DEEP MARGINS INVOLVED ? ?Cancer-basal cell carcinoma ?As suspected ?Schedule for surgery ?

## 2021-10-10 NOTE — Telephone Encounter (Signed)
LM on VM please return my call  

## 2021-10-15 ENCOUNTER — Encounter: Payer: PPO | Admitting: Dermatology

## 2021-10-15 ENCOUNTER — Encounter: Payer: Self-pay | Admitting: Dermatology

## 2021-10-15 ENCOUNTER — Ambulatory Visit (INDEPENDENT_AMBULATORY_CARE_PROVIDER_SITE_OTHER): Payer: PPO | Admitting: Dermatology

## 2021-10-15 DIAGNOSIS — Z95810 Presence of automatic (implantable) cardiac defibrillator: Secondary | ICD-10-CM | POA: Diagnosis not present

## 2021-10-15 DIAGNOSIS — C44219 Basal cell carcinoma of skin of left ear and external auricular canal: Secondary | ICD-10-CM | POA: Diagnosis not present

## 2021-10-15 MED ORDER — MUPIROCIN 2 % EX OINT: 1.0000 "application " | TOPICAL_OINTMENT | Freq: Every day | CUTANEOUS | 0 refills | Status: DC

## 2021-10-15 NOTE — Progress Notes (Signed)
? ?  Follow-Up Visit ?  ?Subjective  ?Nathaniel Woods is a 88 y.o. male who presents for the following: Basal Cell Carcinoma (Biopsy proven BCC of left sup ear - excise today). ? ?The following portions of the chart were reviewed this encounter and updated as appropriate:  ? Tobacco  Allergies  Meds  Problems  Med Hx  Surg Hx  Fam Hx   ?  ?Review of Systems:  No other skin or systemic complaints except as noted in HPI or Assessment and Plan. ? ?Objective  ?Well appearing patient in no apparent distress; mood and affect are within normal limits. ? ?A focused examination was performed including left ear. Relevant physical exam findings are noted in the Assessment and Plan. ? ?Left sup ear ?Healing biopsy site ? ? ?Assessment & Plan  ?Basal cell carcinoma (BCC) of skin of left ear ?Left sup ear ? ?Skin excision ? ?Lesion length (cm):  2.7 ?Lesion width (cm):  2.7 ?Margin per side (cm):  0.2 ?Total excision diameter (cm):  3.1 ?Informed consent: discussed and consent obtained   ?Timeout: patient name, date of birth, surgical site, and procedure verified   ?Procedure prep:  Patient was prepped and draped in usual sterile fashion ?Prep type:  Isopropyl alcohol and povidone-iodine ?Anesthesia: the lesion was anesthetized in a standard fashion   ?Anesthetic:  1% lidocaine w/ epinephrine 1-100,000 buffered w/ 8.4% NaHCO3 ?Instrument used: #15 blade   ?Hemostasis achieved with: pressure   ?Hemostasis achieved with comment:  Electrocautery ?Outcome: patient tolerated procedure well with no complications   ?Post-procedure details: sterile dressing applied and wound care instructions given   ?Dressing type: bandage and pressure dressing (mupirocin)   ?Additional details:  Left to heal by secondary intention ?Marked with ink 12:00 superior medial helix tip ? ? ?mupirocin ointment (BACTROBAN) 2 % ?Apply 1 application. topically daily. With dressing changes ? ?Specimen 1 - Surgical pathology ?Differential Diagnosis: Biopsy  proven BCC ?Check Margins: Yes ?[id number] ?Marked with ink 12:00 superior medial helix tip ? ? ?Return in about 1 week (around 10/22/2021) for surgery follow up. ? ?I, Ashok Cordia, CMA, am acting as scribe for Sarina Ser, MD . ?Documentation: I have reviewed the above documentation for accuracy and completeness, and I agree with the above. ? ?Sarina Ser, MD ? ? ?

## 2021-10-15 NOTE — Patient Instructions (Signed)
Wound Care Instructions ? ?On the day following your surgery, you should begin doing daily dressing changes: ?Remove the old dressing and discard it. ?Cleanse the wound gently with tap water. This may be done in the shower or by placing a wet gauze pad directly on the wound and letting it soak for several minutes. ?It is important to gently remove any dried blood from the wound in order to encourage healing. This may be done by gently rolling a moistened Q-tip on the dried blood. Do not pick at the wound. ?If the wound should start to bleed, continue cleaning the wound, then place a moist gauze pad on the wound and hold pressure for a few minutes.  ?Make sure you then dry the skin surrounding the wound completely or the tape will not stick to the skin. Do not use cotton balls on the wound. ?After the wound is clean and dry, apply the ointment gently with a Q-tip. ?Cut a non-stick pad to fit the size of the wound. Lay the pad flush to the wound. If the wound is draining, you may want to reinforce it with a small amount of gauze on top of the non-stick pad for a little added compression to the area. ?Use the tape to seal the area completely. ?Select from the following with respect to your individual situation: ?If your wound has been stitched closed: continue the above steps 1-8 at least daily until your sutures are removed. ?If your wound has been left open to heal: continue steps 1-8 at least daily for the first 3-4 weeks. ?We would like for you to take a few extra precautions for at least the next week. ?Sleep with your head elevated on pillows if our wound is on your head. ?Do not bend over or lift heavy items to reduce the chance of elevated blood pressure to the wound ?Do not participate in particularly strenuous activities. ? ? ?Below is a list of dressing supplies you might need.  ?Cotton-tipped applicators - Q-tips ?Gauze pads (2x2 and/or 4x4) - All-Purpose Sponges ?Non-stick dressing material - Telfa ?Tape -  Paper or Hypafix ?New and clean tube of petroleum jelly - Vaseline  ? ? ?Comments on Post-Operative Period ?Slight swelling and redness often appear around the wound. This is normal and will disappear within several days following the surgery. ?The healing wound will drain a brownish-red-yellow discharge during healing. This is a normal phase of wound healing. As the wound begins to heal, the drainage may increase in amount. Again, this drainage is normal. ?Notify us if the drainage becomes persistently bloody, excessively swollen, or intensely painful or develops a foul odor or red streaks.  ?If you should experience mild discomfort during the healing phase, you may take an aspirin-free medication such as Tylenol (acetaminophen). Notify us if the discomfort is severe or persistent. Avoid alcoholic beverages when taking pain medicine. ? ?In Case of Wound Hemorrhage ?A wound hemorrhage is when the bandage suddenly becomes soaked with bright red blood and flows profusely. If this happens, sit down or lie down with your head elevated. If the wound has a dressing on it, do not remove the dressing. Apply pressure to the existing gauze. If the wound is not covered, use a gauze pad to apply pressure and continue applying the pressure for 20 minutes without peeking. DO NOT COVER THE WOUND WITH A LARGE TOWEL OR WASH CLOTH. Release your hand from the wound site but do not remove the dressing. If the bleeding has stopped,   gently clean around the wound. Leave the dressing in place for 24 hours if possible. This wait time allows the blood vessels to close off so that you do not spark a new round of bleeding by disrupting the newly clotted blood vessels with an immediate dressing change. If the bleeding does not subside, continue to hold pressure. If matters are out of your control, contact an After Hours clinic or go to the Emergency Room. ? ? ? ?If You Need Anything After Your Visit ? ?If you have any questions or concerns for  your doctor, please call our main line at [phone] and press option 4 to reach your doctor's medical assistant. If no one answers, please leave a voicemail as directed and we will return your call as soon as possible. Messages left after 4 pm will be answered the following business day.  ? ?You may also send [place] a message via MyChart. We typically respond to MyChart messages within 1-2 business days. ? ?For prescription refills, please ask your pharmacy to contact our office. Our fax number is [phone]. ? ?If you have an urgent issue when the clinic is closed that cannot wait until the next business day, you can page your doctor at the number below.   ? ?Please note that while we do our best to be available for urgent issues outside of office hours, we are not available 24/7.  ? ?If you have an urgent issue and are unable to reach [place], you may choose to seek medical care at your doctor's office, retail clinic, urgent care center, or emergency room. ? ?If you have a medical emergency, please immediately call 911 or go to the emergency department. ? ?Pager Numbers ? ?- Dr. Nehemiah Massed: [phone] ? ?- Dr. Laurence Ferrari: [phone] ? ?- Dr. Nicole Kindred: [phone] ? ?In the event of inclement weather, please call our main line at [phone] for an update on the status of any delays or closures. ? ?Dermatology Medication Tips: ?Please keep the boxes that topical medications come in in order to help keep track of the instructions about where and how to use these. Pharmacies typically print the medication instructions only on the boxes and not directly on the medication tubes.  ? ?If your medication is too expensive, please contact our office at [phone] option 4 or send [place] a message through [place].  ? ?We are unable to tell what your co-pay for medications will be in advance as this is different depending on your insurance coverage. However, we may be able to find a substitute medication at lower cost or fill out  paperwork to get insurance to cover a needed medication.  ? ?If a prior authorization is required to get your medication covered by your insurance company, please allow [place] 1-2 business days to complete this process. ? ?Drug prices often vary depending on where the prescription is filled and some pharmacies may offer cheaper prices. ? ?The website www.goodrx.com contains coupons for medications through different pharmacies. The prices here do not account for what the cost may be with help from insurance (it may be cheaper with your insurance), but the website can give you the price if you did not use any insurance.  ?- You can print the associated coupon and take it with your prescription to the pharmacy.  ?- You may also stop by our office during regular business hours and pick up a GoodRx coupon card.  ?- If you need your prescription sent electronically to a different pharmacy, notify our  office through [facility] or by phone at [phone] option 4. ? ? ? ? ?Si Usted Necesita Algo Despu?s de Su Visita ? ?Tambi?n puede enviarnos un mensaje a trav?s de MyChart. Por lo general respondemos a los mensajes de MyChart en el transcurso de 1 a 2 d?as h?biles. ? ?Para renovar recetas, por favor pida a su farmacia que se ponga en contacto con nuestra oficina. Nuestro n?mero de fax es el [phone]. ? ?Si tiene un asunto urgente cuando la cl?nica est? cerrada y que no puede esperar hasta el siguiente d?a h?bil, puede llamar/localizar a su doctor(a) al n?mero que aparece a continuaci?n.  ? ?Por favor, tenga en cuenta que aunque hacemos todo lo posible para estar disponibles para asuntos urgentes fuera del horario de oficina, no estamos disponibles las 24 horas del d?a, los 7 d?as de la semana.  ? ?Si tiene un problema urgente y no puede comunicarse con nosotros, puede optar por buscar atenci?n m?dica  en el consultorio de su doctor(a), en una cl?nica privada, en un centro de atenci?n urgente o en una sala de  emergencias. ? ?Si tiene Engineer, maintenance (IT) m?dica, por favor llame inmediatamente al 911 o vaya a la sala de emergencias. ? ?N?meros de b?per ? ?- Dr. Nehemiah Massed: [phone] ? ?- Dra. Moye: [phone] ? ?- Dra

## 2021-10-16 ENCOUNTER — Encounter: Payer: Self-pay | Admitting: Dermatology

## 2021-10-21 ENCOUNTER — Ambulatory Visit (INDEPENDENT_AMBULATORY_CARE_PROVIDER_SITE_OTHER): Payer: PPO | Admitting: Dermatology

## 2021-10-21 DIAGNOSIS — Z85828 Personal history of other malignant neoplasm of skin: Secondary | ICD-10-CM | POA: Diagnosis not present

## 2021-10-21 DIAGNOSIS — L89309 Pressure ulcer of unspecified buttock, unspecified stage: Secondary | ICD-10-CM | POA: Diagnosis not present

## 2021-10-21 DIAGNOSIS — L578 Other skin changes due to chronic exposure to nonionizing radiation: Secondary | ICD-10-CM | POA: Diagnosis not present

## 2021-10-21 DIAGNOSIS — L820 Inflamed seborrheic keratosis: Secondary | ICD-10-CM

## 2021-10-21 MED ORDER — TACROLIMUS 0.1 % EX OINT: TOPICAL_OINTMENT | Freq: Every day | CUTANEOUS | 3 refills | Status: DC

## 2021-10-21 NOTE — Patient Instructions (Addendum)
For the buttocks recommend hypochlorous acid spray or Puracyn cleanser clean once daily pat dry then apply Tacrolimus ointment. Continue to use the zinc oxide barrier protector.  ? ?Continue wound care on the left ear may change bandage every two days. Alternate Mupirocin and OTC Neosporin.  ? ? ?If You Need Anything After Your Visit ? ?If you have any questions or concerns for your doctor, please call our main line at [phone] and press option 4 to reach your doctor's medical assistant. If no one answers, please leave a voicemail as directed and we will return your call as soon as possible. Messages left after 4 pm will be answered the following business day.  ? ?You may also send [place] a message via MyChart. We typically respond to MyChart messages within 1-2 business days. ? ?For prescription refills, please ask your pharmacy to contact our office. Our fax number is [phone]. ? ?If you have an urgent issue when the clinic is closed that cannot wait until the next business day, you can page your doctor at the number below.   ? ?Please note that while we do our best to be available for urgent issues outside of office hours, we are not available 24/7.  ? ?If you have an urgent issue and are unable to reach [place], you may choose to seek medical care at your doctor's office, retail clinic, urgent care center, or emergency room. ? ?If you have a medical emergency, please immediately call 911 or go to the emergency department. ? ?Pager Numbers ? ?- Dr. Nehemiah Massed: [phone] ? ?- Dr. Laurence Ferrari: [phone] ? ?- Dr. Nicole Kindred: [phone] ? ?In the event of inclement weather, please call our main line at [phone] for an update on the status of any delays or closures. ? ?Dermatology Medication Tips: ?Please keep the boxes that topical medications come in in order to help keep track of the instructions about where and how to use these. Pharmacies typically print the medication instructions only on the boxes and not  directly on the medication tubes.  ? ?If your medication is too expensive, please contact our office at [phone] option 4 or send [place] a message through [place].  ? ?We are unable to tell what your co-pay for medications will be in advance as this is different depending on your insurance coverage. However, we may be able to find a substitute medication at lower cost or fill out paperwork to get insurance to cover a needed medication.  ? ?If a prior authorization is required to get your medication covered by your insurance company, please allow [place] 1-2 business days to complete this process. ? ?Drug prices often vary depending on where the prescription is filled and some pharmacies may offer cheaper prices. ? ?The website www.goodrx.com contains coupons for medications through different pharmacies. The prices here do not account for what the cost may be with help from insurance (it may be cheaper with your insurance), but the website can give you the price if you did not use any insurance.  ?- You can print the associated coupon and take it with your prescription to the pharmacy.  ?- You may also stop by our office during regular business hours and pick up a GoodRx coupon card.  ?- If you need your prescription sent electronically to a different pharmacy, notify our office through [facility] or by phone at [phone] option 4. ? ? ? ? ?Si Usted Necesita Algo Despu?s de Su Visita ? ?Tambi?n puede enviarnos un mensaje  a trav?s de MyChart. Por lo general respondemos a los mensajes de MyChart en el transcurso de 1 a 2 d?as h?biles. ? ?Para renovar recetas, por favor pida a su farmacia que se ponga en contacto con nuestra oficina. Nuestro n?mero de fax es el [phone]. ? ?Si tiene un asunto urgente cuando la cl?nica est? cerrada y que no puede esperar hasta el siguiente d?a h?bil, puede llamar/localizar a su doctor(a) al n?mero que aparece a continuaci?n.  ? ?Por favor, tenga en cuenta que aunque hacemos  todo lo posible para estar disponibles para asuntos urgentes fuera del horario de oficina, no estamos disponibles las 24 horas del d?a, los 7 d?as de la semana.  ? ?Si tiene un problema urgente y no puede comunicarse con nosotros, puede optar por buscar atenci?n m?dica  en el consultorio de su doctor(a), en una cl?nica privada, en un centro de atenci?n urgente o en una sala de emergencias. ? ?Si tiene Engineer, maintenance (IT) m?dica, por favor llame inmediatamente al 911 o vaya a la sala de emergencias. ? ?N?meros de b?per ? ?- Dr. Nehemiah Massed: [phone] ? ?- Dra. Moye: [phone] ? ?- Dra. Nicole Kindred: [phone] ? ?En caso de inclemencias del tiempo, por favor llame a nuestra l?nea principal al [phone] para una actualizaci?n sobre el estado de cualquier retraso o cierre. ? ?Consejos para la medicaci?n en dermatolog?a: ?Por favor, guarde las cajas en las que vienen los medicamentos de uso t?pico para ayudarle a seguir las instrucciones sobre d?nde y c?mo usarlos. Las farmacias generalmente imprimen las instrucciones del medicamento s?lo en las cajas y no directamente en los tubos del [place].  ? ?Si su medicamento es muy caro, por favor, p?ngase en contacto con Zigmund Daniel llamando al [phone] y presione la opci?n 4 o env?enos un mensaje a trav?s de MyChart.  ? ?No podemos decirle cu?l ser? su copago por los medicamentos por adelantado ya que esto es diferente dependiendo de la cobertura de su seguro. Sin embargo, es posible que podamos encontrar un medicamento sustituto a Electrical engineer un formulario para que el seguro cubra el medicamento que se considera necesario.  ? ?Si se requiere Ardelia Mems autorizaci?n previa para que su compa??a de seguros [place] su medicamento, por favor perm?tanos de 1 a 2 d?as h?biles para completar este proceso. ? ?Los precios de los medicamentos var?an con frecuencia dependiendo del Environmental consultant de d?nde se surte la receta y alguna farmacias pueden ofrecer precios m?s baratos. ? ?El  sitio web www.goodrx.com tiene cupones para medicamentos de Airline pilot. Los precios aqu? no tienen en cuenta lo que podr?a costar con la ayuda del seguro (puede ser m?s barato con su seguro), pero el sitio web puede darle el precio si no utiliz? ning?n seguro.  ?- Puede imprimir el cup?n correspondiente y llevarlo con su receta a la farmacia.  ?- Tambi?n puede pasar por nuestra oficina durante el horario de atenci?n regular y recoger una tarjeta de cupones de GoodRx.  ?- Si necesita que su receta se env?e electr?nicamente a Chiropodist, informe a nuestra oficina a trav?s de MyChart de [place] o por tel?fono llamando al [phone] y presione la opci?n 4. ? ?

## 2021-10-21 NOTE — Progress Notes (Deleted)
   Follow-Up Visit   Subjective  Nathaniel Woods is a 88 y.o. male who presents for the following: wound recheck (Bx proven BCC of the L sup ear - S/P excision with secondary intention healing. Patient currently alternating OTC Neosporin and Mupirocin 2% ointment daily). Patient c/o irritating lesion on the R lat calf that he would like checked today, and a painful irritating rash on the buttocks for which he is using zinc oxide cream.    The following portions of the chart were reviewed this encounter and updated as appropriate:       Review of Systems:  No other skin or systemic complaints except as noted in HPI or Assessment and Plan.  Objective  Well appearing patient in no apparent distress; mood and affect are within normal limits.  A focused examination was performed including the face, L ear and buttocks. Relevant physical exam findings are noted in the Assessment and Plan.  L sup ear Healing excision site.  Gluteal Crease     R lat calf x 1 Erythematous stuck-on, waxy papule or plaque    Assessment & Plan  History of basal cell carcinoma (BCC) L sup ear  Clear. Observe for recurrence. Call clinic for new or changing lesions.  Recommend regular skin exams, daily broad-spectrum spf 30+ sunscreen use, and photoprotection.    Pressure injury of skin of buttock, unspecified injury stage, unspecified laterality Gluteal Crease  Due to pressure from sitting -   Continue Zinc oxide barrier cream and using a donut shaped pillow when sitting.  Start Tacrolimus ointment to aa QD after cleaning with hypochlorous acid spray (Puracyn).   tacrolimus (PROTOPIC) 0.1 % ointment - Gluteal Crease Apply topically daily. Apply to pressure wound on buttocks QD after cleaning area with wound irrigation spray and patting dry.  Inflamed seborrheic keratosis R lat calf x 1  Destruction of lesion - R lat calf x 1 Complexity: simple   Destruction method: cryotherapy   Informed  consent: discussed and consent obtained   Timeout:  patient name, date of birth, surgical site, and procedure verified Lesion destroyed using liquid nitrogen: Yes   Region frozen until ice ball extended beyond lesion: Yes   Outcome: patient tolerated procedure well with no complications   Post-procedure details: wound care instructions given     Seborrheic Keratoses - Stuck-on, waxy, tan-brown papules and/or plaques  - Benign-appearing - Discussed benign etiology and prognosis. - Observe - Call for any changes  Return in about 1 month (around 11/21/2021) for wound recheck.  Luther Redo, CMA, am acting as scribe for Sarina Ser, MD .

## 2021-10-21 NOTE — Progress Notes (Unsigned)
   Follow-Up Visit   Subjective  Nathaniel Woods is a 88 y.o. male who presents for the following: wound recheck (Bx proven BCC of the L sup ear - S/P excision with secondary intention healing. Patient currently alternating OTC Neosporin and Mupirocin 2% ointment daily). He would like his buttocks checked as he has a rash there does seem to to improve. The patient's son is with him who contributes to history. The patient has spots, moles and lesions to be evaluated, some may be new or changing and the patient has concerns that these could be cancer.  Patient complains of a spot on the leg that is bothersome.  The following portions of the chart were reviewed this encounter and updated as appropriate:   Tobacco  Allergies  Meds  Problems  Med Hx  Surg Hx  Fam Hx     Review of Systems:  No other skin or systemic complaints except as noted in HPI or Assessment and Plan.  Objective  Well appearing patient in no apparent distress; mood and affect are within normal limits.  A focused examination was performed including the face and L ear. Relevant physical exam findings are noted in the Assessment and Plan.  L sup ear Healing excision site.  Gluteal Crease     R lat calf x 1 Erythematous stuck-on, waxy papule or plaque   Assessment & Plan  History of basal cell carcinoma (BCC) -status post excision with margins clear.  Healing well.  No evidence of infection. L sup ear Clear. Observe for recurrence. Call clinic for new or changing lesions.  Recommend regular skin exams, daily broad-spectrum spf 30+ sunscreen use, and photoprotection.    Pressure injury of skin of buttock, unspecified injury stage, unspecified laterality Gluteal Crease See photo Due to pressure from sitting -  Continue Zinc oxide barrier cream and patient is using a donut shaped pillow when sitting.  Start Tacrolimus ointment to aa QD after cleaning with hypochlorous acid spray (Puracyn).  Chronic and  persistent condition with duration or expected duration over one year. Condition is symptomatic / bothersome to patient. Not to goal.  tacrolimus (PROTOPIC) 0.1 % ointment - Gluteal Crease Apply topically daily. Apply to pressure wound on buttocks QD after cleaning area with wound irrigation spray and patting dry.  Inflamed seborrheic keratosis R lat calf x 1 Destruction of lesion - R lat calf x 1 Complexity: simple   Destruction method: cryotherapy   Informed consent: discussed and consent obtained   Timeout:  patient name, date of birth, surgical site, and procedure verified Lesion destroyed using liquid nitrogen: Yes   Region frozen until ice ball extended beyond lesion: Yes   Outcome: patient tolerated procedure well with no complications   Post-procedure details: wound care instructions given    Actinic Damage - chronic, secondary to cumulative UV radiation exposure/sun exposure over time - diffuse scaly erythematous macules with underlying dyspigmentation - Recommend daily broad spectrum sunscreen SPF 30+ to sun-exposed areas, reapply every 2 hours as needed.  - Recommend staying in the shade or wearing long sleeves, sun glasses (UVA+UVB protection) and wide brim hats (4-inch brim around the entire circumference of the hat). - Call for new or changing lesions.  Return in about 1 month (around 11/21/2021) for wound recheck.  Luther Redo, CMA, am acting as scribe for Sarina Ser, MD . Documentation: I have reviewed the above documentation for accuracy and completeness, and I agree with the above.  Sarina Ser, MD

## 2021-10-23 ENCOUNTER — Encounter: Payer: Self-pay | Admitting: Ophthalmology

## 2021-11-01 ENCOUNTER — Encounter: Payer: Self-pay | Admitting: Dermatology

## 2021-11-05 ENCOUNTER — Ambulatory Visit: Payer: PPO | Admitting: Anesthesiology

## 2021-11-05 ENCOUNTER — Other Ambulatory Visit: Payer: Self-pay

## 2021-11-05 ENCOUNTER — Encounter
Admission: RE | Disposition: A | Discharge status: Home / Self Care | Payer: Self-pay | Source: Home / Self Care | Attending: Ophthalmology

## 2021-11-05 ENCOUNTER — Encounter: Payer: Self-pay | Admitting: Ophthalmology

## 2021-11-05 ENCOUNTER — Ambulatory Visit
Admission: RE | Admit: 2021-11-05 | Discharge: 2021-11-05 | Disposition: A | Discharge status: Home / Self Care | Payer: PPO | Attending: Ophthalmology | Admitting: Ophthalmology

## 2021-11-05 DIAGNOSIS — I480 Paroxysmal atrial fibrillation: Secondary | ICD-10-CM | POA: Diagnosis not present

## 2021-11-05 DIAGNOSIS — I5022 Chronic systolic (congestive) heart failure: Secondary | ICD-10-CM | POA: Insufficient documentation

## 2021-11-05 DIAGNOSIS — Z95810 Presence of automatic (implantable) cardiac defibrillator: Secondary | ICD-10-CM | POA: Diagnosis not present

## 2021-11-05 DIAGNOSIS — H2511 Age-related nuclear cataract, right eye: Secondary | ICD-10-CM | POA: Insufficient documentation

## 2021-11-05 DIAGNOSIS — E1136 Type 2 diabetes mellitus with diabetic cataract: Secondary | ICD-10-CM | POA: Insufficient documentation

## 2021-11-05 DIAGNOSIS — I2510 Atherosclerotic heart disease of native coronary artery without angina pectoris: Secondary | ICD-10-CM | POA: Insufficient documentation

## 2021-11-05 DIAGNOSIS — I110 Hypertensive heart disease with heart failure: Secondary | ICD-10-CM | POA: Insufficient documentation

## 2021-11-05 DIAGNOSIS — I252 Old myocardial infarction: Secondary | ICD-10-CM | POA: Diagnosis not present

## 2021-11-05 DIAGNOSIS — Z8673 Personal history of transient ischemic attack (TIA), and cerebral infarction without residual deficits: Secondary | ICD-10-CM | POA: Insufficient documentation

## 2021-11-05 DIAGNOSIS — M1990 Unspecified osteoarthritis, unspecified site: Secondary | ICD-10-CM | POA: Diagnosis not present

## 2021-11-05 DIAGNOSIS — H25811 Combined forms of age-related cataract, right eye: Secondary | ICD-10-CM | POA: Diagnosis not present

## 2021-11-05 DIAGNOSIS — J45909 Unspecified asthma, uncomplicated: Secondary | ICD-10-CM | POA: Insufficient documentation

## 2021-11-05 HISTORY — PX: CATARACT EXTRACTION W/PHACO: SHX586

## 2021-11-05 HISTORY — DX: Presence of external hearing-aid: Z97.4

## 2021-11-05 HISTORY — DX: Paroxysmal atrial fibrillation: I48.0

## 2021-11-05 HISTORY — DX: Chronic systolic (congestive) heart failure: I50.22

## 2021-11-05 HISTORY — DX: Abdominal aortic aneurysm, without rupture, unspecified: I71.40

## 2021-11-05 HISTORY — DX: Unspecified hearing loss, unspecified ear: H91.90

## 2021-11-05 HISTORY — DX: Bradycardia, unspecified: R00.1

## 2021-11-05 LAB — GLUCOSE, CAPILLARY
Glucose-Capillary: 104 mg/dL — ABNORMAL HIGH (ref 70–99)
Glucose-Capillary: 114 mg/dL — ABNORMAL HIGH (ref 70–99)

## 2021-11-05 SURGERY — PHACOEMULSIFICATION, CATARACT, WITH IOL INSERTION
Anesthesia: Monitor Anesthesia Care | Site: Eye | Laterality: Right

## 2021-11-05 MED ORDER — SIGHTPATH DOSE#1 NA CHONDROIT SULF-NA HYALURON 40-17 MG/ML IO SOLN
INTRAOCULAR | Status: DC | PRN
  Administered 2021-11-05: 1 mL via INTRAOCULAR

## 2021-11-05 MED ORDER — BRIMONIDINE TARTRATE-TIMOLOL 0.2-0.5 % OP SOLN
OPHTHALMIC | Status: DC | PRN
  Administered 2021-11-05: 1 [drp] via OPHTHALMIC

## 2021-11-05 MED ORDER — CEFUROXIME OPHTHALMIC INJECTION 1 MG/0.1 ML
INJECTION | OPHTHALMIC | Status: DC | PRN
  Administered 2021-11-05: 0.1 mL via INTRACAMERAL

## 2021-11-05 MED ORDER — ARMC OPHTHALMIC DILATING DROPS
1.0000 "application " | OPHTHALMIC | Status: DC | PRN
  Administered 2021-11-05 (×3): 1 via OPHTHALMIC

## 2021-11-05 MED ORDER — FENTANYL CITRATE (PF) 100 MCG/2ML IJ SOLN
INTRAMUSCULAR | Status: DC | PRN
  Administered 2021-11-05: 50 ug via INTRAVENOUS

## 2021-11-05 MED ORDER — SIGHTPATH DOSE#1 BSS IO SOLN
INTRAOCULAR | Status: DC | PRN
  Administered 2021-11-05: 1 mL

## 2021-11-05 MED ORDER — SIGHTPATH DOSE#1 BSS IO SOLN: INTRAOCULAR | Status: DC | PRN

## 2021-11-05 MED ORDER — TETRACAINE HCL 0.5 % OP SOLN
1.0000 [drp] | OPHTHALMIC | Status: DC | PRN
  Administered 2021-11-05 (×3): 1 [drp] via OPHTHALMIC

## 2021-11-05 MED ORDER — SIGHTPATH DOSE#1 BSS IO SOLN
INTRAOCULAR | Status: DC | PRN
  Administered 2021-11-05: 15 mL

## 2021-11-05 SURGICAL SUPPLY — 16 items
CANNULA ANT/CHMB 27G (MISCELLANEOUS) IMPLANT
CANNULA ANT/CHMB 27GA (MISCELLANEOUS) ×2 IMPLANT
CATARACT SUITE SIGHTPATH (MISCELLANEOUS) ×2 IMPLANT
FEE CATARACT SUITE SIGHTPATH (MISCELLANEOUS) ×1 IMPLANT
GLOVE SURG ENC TEXT LTX SZ8 (GLOVE) ×2 IMPLANT
GLOVE SURG TRIUMPH 8.0 PF LTX (GLOVE) ×2 IMPLANT
LENS IOL TECNIS EYHANCE 18.5 (Intraocular Lens) ×1 IMPLANT
NDL FILTER BLUNT 18X1 1/2 (NEEDLE) ×1 IMPLANT
NEEDLE FILTER BLUNT 18X 1/2SAF (NEEDLE) ×1
NEEDLE FILTER BLUNT 18X1 1/2 (NEEDLE) ×1 IMPLANT
PACK VIT ANT 23G (MISCELLANEOUS) IMPLANT
RING MALYGIN (MISCELLANEOUS) IMPLANT
SUT ETHILON 10-0 CS-B-6CS-B-6 (SUTURE)
SUTURE EHLN 10-0 CS-B-6CS-B-6 (SUTURE) IMPLANT
SYR 3ML LL SCALE MARK (SYRINGE) ×2 IMPLANT
WATER STERILE IRR 250ML POUR (IV SOLUTION) ×2 IMPLANT

## 2021-11-05 NOTE — H&P (Signed)
[place]   Primary Care Physician:  Derinda Late, MD Ophthalmologist: Dr. George Ina  Pre-Procedure History & Physical: HPI:  Nathaniel Woods is a 88 y.o. male here for cataract surgery.   Past Medical History:  Diagnosis Date   AAA (abdominal aortic aneurysm) without rupture (HCC)    AICD (automatic cardioverter/defibrillator) present 12/17/2016   Angina    Anxiety    "just before major surgery"   Arthritis    Asthma    childhood   Basal cell carcinoma 10/07/2021   left superior ear, schedule surgery   BPH associated with nocturia    Chronic systolic CHF (congestive heart failure) ([place])    Coronary artery disease    Hemorrhagic stroke ([place]) 09/07/2008   S/P fall   HOH (hard of hearing)    Hyperlipidemia    Hypertension    Inferior MI ([place]) 10/08/2011   Kidney stone    PAF (paroxysmal atrial fibrillation) ([place])    Sinus bradycardia    Stroke ([place])    Type II diabetes mellitus ([place])    Improved with weight loss   Wears hearing aid in both ears     Past Surgical History:  Procedure Laterality Date   ABDOMINAL AORTIC ANEURYSM REPAIR  2013   APPENDECTOMY  06/09/1945   CARDIAC CATHETERIZATION  10/08/2011   CORONARY ANGIOPLASTY WITH STENT PLACEMENT  06/10/2007   "2; made total of 6"   CORONARY ARTERY BYPASS GRAFT  10/24/2011   Procedure: CORONARY ARTERY BYPASS GRAFTING (CABG);  Surgeon: Ivin Poot, MD;  Location: [facility];  Service: Open Heart Surgery;  Laterality: N/A;  Coronary Artery Bypass Graft times four using the left internal mammary artery and the right and left greater saphenous veins harvested endoscopically.  transesophageal Echocardiogram   ICD IMPLANT  12/17/2016   INTERTROCHANTERIC HIP FRACTURE SURGERY  05/10/1991   "pin, 2 screws, plate"; right   LITHOTRIPSY  ~ 04/2011   TONSILLECTOMY  06/09/1942   VASECTOMY  ~ 1970    Prior to Admission medications   Medication Sig Start Date End Date Taking? Authorizing Provider  acetaminophen  (TYLENOL) 325 MG tablet Take 2 tablets (650 mg total) by mouth every 6 (six) hours as needed for mild pain (or Fever >/= 101). 03/30/19  Yes Gouru, Illene Silver, MD  Cholecalciferol (VITAMIN D3) 50 MCG (2000 UT) TABS Take by mouth daily.   Yes [provider]  citalopram (CELEXA) 20 MG tablet Take 20 mg by mouth daily.   Yes [provider]  Coenzyme Q10 (COQ10 PO) Take 1 Dose by mouth daily.    Yes [provider]  ELIQUIS 5 MG TABS tablet Take 5 mg by mouth 2 (two) times daily.  06/10/17  Yes [provider]  finasteride (PROSCAR) 5 MG tablet Take 5 mg by mouth daily.   Yes [provider]  fluticasone (FLONASE) 50 MCG/ACT nasal spray Place 2 sprays into both nostrils daily.  04/21/16  Yes [provider]  furosemide (LASIX) 20 MG tablet Take 20 mg by mouth daily.  08/03/17  Yes [provider]  latanoprost (XALATAN) 0.005 % ophthalmic solution Place 1 drop into both eyes at bedtime.  07/21/17  Yes [provider]  losartan (COZAAR) 50 MG tablet Take 50 mg by mouth daily.   Yes [provider]  metFORMIN (GLUCOPHAGE) 500 MG tablet Take 500 mg by mouth daily with breakfast.   Yes [provider]  metoprolol succinate (TOPROL-XL) 50 MG 24 hr tablet Take 50 mg by  mouth daily.  06/10/17  Yes [provider]  mirtazapine (REMERON) 15 MG tablet Take 15 mg by mouth at bedtime.   Yes [provider]  montelukast (SINGULAIR) 10 MG tablet Take 10 mg by mouth at bedtime.  06/19/17  Yes [provider]  Multiple Vitamin (MULITIVITAMIN WITH MINERALS) TABS Take 1 tablet by mouth daily.   Yes [provider]  Omega-3 Fatty Acids (FISH OIL) 1200 MG CAPS Take 1 capsule by mouth daily.    Yes [provider]  Rosuvastatin Calcium (CRESTOR PO) Take 5 mg by mouth every Monday, Wednesday, and Friday.    Yes [provider]  spironolactone (ALDACTONE) 25 MG tablet Take 12.5 mg by mouth  daily.   Yes [provider]  tacrolimus (PROTOPIC) 0.1 % ointment Apply topically daily. Apply to pressure wound on buttocks QD after cleaning area with wound irrigation spray and patting dry. 10/21/21  Yes Ralene Bathe, MD  mupirocin ointment (BACTROBAN) 2 % Apply 1 application. topically daily. With dressing changes 10/15/21   Ralene Bathe, MD    Allergies as of 10/11/2021 - Review Complete 03/28/2019  Allergen Reaction Noted   Levofloxacin Rash 11/24/2014   Ambien [zolpidem tartrate] Other (See Comments) 03/30/2019   Aspirin Other (See Comments) 10/19/2011   Bee venom Other (See Comments) 12/16/2016   Chlorpheniramine Other (See Comments) 12/19/2016   Decongestant [pseudoephedrine hcl] Other (See Comments) 10/19/2011   Simvastatin Other (See Comments) 10/20/2013   Zolpidem Other (See Comments) 10/20/2013   Contrast media [iodinated contrast media] Rash 10/19/2011    Family History  Problem Relation Age of Onset   Breast cancer Sister     Social History   Socioeconomic History   Marital status: Married    Spouse name: Not on file   Number of children: Not on file   Years of education: Not on file   Highest education level: Not on file  Occupational History   Not on file  Tobacco Use   Smoking status: Former    Packs/day: 1.00    Years: 24.00    Pack years: 24.00    Types: Cigarettes    Quit date: 06/09/1970    Years since quitting: 51.4   Smokeless tobacco: Never  Vaping Use   Vaping Use: Never used  Substance and Sexual Activity   Alcohol use: Not Currently    Alcohol/week: 28.0 standard drinks    Types: 28 Shots of liquor per week    Comment: None for several years   Drug use: No   Sexual activity: Never  Other Topics Concern   Not on file  Social History Narrative   Not on file   Social Determinants of Health   Financial Resource Strain: Not on file  Food Insecurity: Not on file  Transportation Needs: Not on file  Physical Activity: Not  on file  Stress: Not on file  Social Connections: Not on file  Intimate Partner Violence: Not on file    Review of Systems: See HPI, otherwise negative ROS  Physical Exam: BP 99/62   Pulse 61   Temp 98.1 F (36.7 C)   Ht 6' (1.829 m)   Wt 74.8 kg   SpO2 97%   BMI 22.38 kg/m  General:   Alert, cooperative in NAD Head:  Normocephalic and atraumatic. Respiratory:  Normal work of breathing. Cardiovascular:  RRR  Impression/Plan: Nathaniel Woods is here for cataract surgery.  Risks, benefits, limitations, and alternatives regarding cataract surgery have been reviewed  with the patient.  Questions have been answered.  All parties agreeable.   Birder Robson, MD  11/05/2021, 11:30 AM

## 2021-11-05 NOTE — Transfer of Care (Signed)
Immediate Anesthesia Transfer of Care Note  Patient: Nathaniel Woods  Procedure(s) Performed: CATARACT EXTRACTION PHACO AND INTRAOCULAR LENS PLACEMENT (I[facility]) RIGHT (Right: Eye)  Patient Location: PACU  Anesthesia Type: MAC  Level of Consciousness: awake, alert  and patient cooperative  Airway and Oxygen Therapy: Patient Spontanous Breathing and Patient connected to supplemental oxygen  Post-op Assessment: Post-op Vital signs reviewed, Patient's Cardiovascular Status Stable, Respiratory Function Stable, Patent Airway and No signs of Nausea or vomiting  Post-op Vital Signs: Reviewed and stable  Complications: No notable events documented.

## 2021-11-05 NOTE — Anesthesia Preprocedure Evaluation (Signed)
Anesthesia Evaluation  Patient identified by MRN, date of birth, ID band Patient awake    Reviewed: Allergy & Precautions, NPO status   Airway Mallampati: II  TM Distance: >3 FB     Dental   Pulmonary asthma , former smoker,    Pulmonary exam normal        Cardiovascular hypertension, + CAD, + Past MI and +CHF  + dysrhythmias (PAF) + pacemaker + Cardiac Defibrillator  Rhythm:Regular Rate:Normal  AAA  TTE 2018 MODERATE-TO-SEVERE LV SYSTOLIC DYSFUNCTION WITH AN ESTIMATED EF = 25-35 %  NORMAL RIGHT VENTRICULAR SYSTOLIC FUNCTION  MODERATE TRICUSPID AND MITRAL VALVE INSUFFICIENCY  TRACE AORTIC VALVE INSUFFICIENCY  NO VALVULAR STENOSIS  MODERATE LV ENLARGEMENT  MODERATE RV ENLARGEMENT  MODERATE BIATRIAL ENLARGEMENT     Neuro/Psych Anxiety CVA (hemorrhagic, after a fall)    GI/Hepatic   Endo/Other  diabetes, Type 2  Renal/GU      Musculoskeletal  (+) Arthritis ,   Abdominal   Peds  Hematology   Anesthesia Other Findings   Reproductive/Obstetrics                             Anesthesia Physical Anesthesia Plan  ASA: 4  Anesthesia Plan: MAC   Post-op Pain Management: Minimal or no pain anticipated   Induction: Intravenous  PONV Risk Score and Plan: TIVA, Midazolam and Treatment may vary due to age or medical condition  Airway Management Planned: Natural Airway and Nasal Cannula  Additional Equipment:   Intra-op Plan:   Post-operative Plan:   Informed Consent: I have reviewed the patients History and Physical, chart, labs and discussed the procedure including the risks, benefits and alternatives for the proposed anesthesia with the patient or authorized representative who has indicated his/her understanding and acceptance.     Consent reviewed with POA  Plan Discussed with: CRNA  Anesthesia Plan Comments:         Anesthesia Quick Evaluation

## 2021-11-05 NOTE — Anesthesia Postprocedure Evaluation (Signed)
Anesthesia Post Note  Patient: Nathaniel Woods  Procedure(s) Performed: CATARACT EXTRACTION PHACO AND INTRAOCULAR LENS PLACEMENT ([place]) RIGHT (Right: Eye)     Patient location during evaluation: PACU Anesthesia Type: MAC Level of consciousness: awake Pain management: pain level controlled Vital Signs Assessment: post-procedure vital signs reviewed and stable Respiratory status: respiratory function stable Cardiovascular status: stable Postop Assessment: no apparent nausea or vomiting Anesthetic complications: no   No notable events documented.  Veda Canning

## 2021-11-05 NOTE — Op Note (Signed)
PREOPERATIVE DIAGNOSIS:  Nuclear sclerotic cataract of the right eye.   POSTOPERATIVE DIAGNOSIS:  Cataract   OPERATIVE PROCEDURE:ORPROCALL@   SURGEON:  Nathaniel Robson, MD.   ANESTHESIA:  Anesthesiologist: Veda Canning, MD CRNA: Silvana Newness, CRNA  1.      Managed anesthesia care. 2.      0.31m of Shugarcaine was instilled in the eye following the paracentesis.   COMPLICATIONS:  None.   TECHNIQUE:   Stop and chop   DESCRIPTION OF PROCEDURE:  The patient was examined and consented in the preoperative holding area where the aforementioned topical anesthesia was applied to the right eye and then brought back to the Operating Room where the right eye was prepped and draped in the usual sterile ophthalmic fashion and a lid speculum was placed. A paracentesis was created with the side port blade and the anterior chamber was filled with viscoelastic. A near clear corneal incision was performed with the steel keratome. A continuous curvilinear capsulorrhexis was performed with a cystotome followed by the capsulorrhexis forceps. Hydrodissection and hydrodelineation were carried out with BSS on a blunt cannula. The lens was removed in a stop and chop  technique and the remaining cortical material was removed with the irrigation-aspiration handpiece. The capsular bag was inflated with viscoelastic and the Technis ZCB00  lens was placed in the capsular bag without complication. The remaining viscoelastic was removed from the eye with the irrigation-aspiration handpiece. The wounds were hydrated. The anterior chamber was flushed with BSS and the eye was inflated to physiologic pressure. 0.128mof Vigamox was placed in the anterior chamber. The wounds were found to be water tight. The eye was dressed with Combigan. The patient was given protective glasses to wear throughout the day and a shield with which to sleep tonight. The patient was also given drops with which to begin a drop regimen today and will  follow-up with me in one day. Implant Name Type Inv. Item Serial No. Manufacturer Lot No. LRB No. Used Action  LENS IOL TECNIS EYHANCE 18.5 - [id number] Lens LENS IOL TECNIS EYHANCE 18.5 [id number]  Right 1 Implanted   Procedure(s) with comments: CATARACT EXTRACTION PHACO AND INTRAOCULAR LENS PLACEMENT (IOC) RIGHT (Right) - 6.83 0:45.5  Electronically signed: WiBirder Woods/30/2023 11:57 AM

## 2021-11-06 ENCOUNTER — Encounter: Payer: Self-pay | Admitting: Ophthalmology

## 2021-11-18 NOTE — Discharge Instructions (Signed)

## 2021-11-19 ENCOUNTER — Encounter
Admission: RE | Disposition: A | Discharge status: Home / Self Care | Payer: Self-pay | Source: Home / Self Care | Attending: Ophthalmology

## 2021-11-19 ENCOUNTER — Encounter: Payer: Self-pay | Admitting: Ophthalmology

## 2021-11-19 ENCOUNTER — Ambulatory Visit
Admission: RE | Admit: 2021-11-19 | Discharge: 2021-11-19 | Disposition: A | Discharge status: Home / Self Care | Payer: PPO | Attending: Ophthalmology | Admitting: Ophthalmology

## 2021-11-19 ENCOUNTER — Other Ambulatory Visit: Payer: Self-pay

## 2021-11-19 ENCOUNTER — Ambulatory Visit: Payer: PPO | Admitting: Anesthesiology

## 2021-11-19 DIAGNOSIS — Z87891 Personal history of nicotine dependence: Secondary | ICD-10-CM | POA: Diagnosis not present

## 2021-11-19 DIAGNOSIS — H2512 Age-related nuclear cataract, left eye: Secondary | ICD-10-CM | POA: Insufficient documentation

## 2021-11-19 DIAGNOSIS — F419 Anxiety disorder, unspecified: Secondary | ICD-10-CM | POA: Diagnosis not present

## 2021-11-19 DIAGNOSIS — Z7984 Long term (current) use of oral hypoglycemic drugs: Secondary | ICD-10-CM | POA: Diagnosis not present

## 2021-11-19 DIAGNOSIS — M1990 Unspecified osteoarthritis, unspecified site: Secondary | ICD-10-CM | POA: Diagnosis not present

## 2021-11-19 DIAGNOSIS — I5022 Chronic systolic (congestive) heart failure: Secondary | ICD-10-CM | POA: Diagnosis not present

## 2021-11-19 DIAGNOSIS — I110 Hypertensive heart disease with heart failure: Secondary | ICD-10-CM | POA: Insufficient documentation

## 2021-11-19 DIAGNOSIS — I480 Paroxysmal atrial fibrillation: Secondary | ICD-10-CM | POA: Insufficient documentation

## 2021-11-19 DIAGNOSIS — Z8673 Personal history of transient ischemic attack (TIA), and cerebral infarction without residual deficits: Secondary | ICD-10-CM | POA: Insufficient documentation

## 2021-11-19 DIAGNOSIS — H25812 Combined forms of age-related cataract, left eye: Secondary | ICD-10-CM | POA: Diagnosis not present

## 2021-11-19 DIAGNOSIS — E1136 Type 2 diabetes mellitus with diabetic cataract: Secondary | ICD-10-CM | POA: Insufficient documentation

## 2021-11-19 DIAGNOSIS — I2510 Atherosclerotic heart disease of native coronary artery without angina pectoris: Secondary | ICD-10-CM | POA: Insufficient documentation

## 2021-11-19 DIAGNOSIS — I252 Old myocardial infarction: Secondary | ICD-10-CM | POA: Diagnosis not present

## 2021-11-19 DIAGNOSIS — Z95810 Presence of automatic (implantable) cardiac defibrillator: Secondary | ICD-10-CM | POA: Insufficient documentation

## 2021-11-19 DIAGNOSIS — J45909 Unspecified asthma, uncomplicated: Secondary | ICD-10-CM | POA: Diagnosis not present

## 2021-11-19 HISTORY — PX: CATARACT EXTRACTION W/PHACO: SHX586

## 2021-11-19 LAB — GLUCOSE, CAPILLARY
Glucose-Capillary: 121 mg/dL — ABNORMAL HIGH (ref 70–99)
Glucose-Capillary: 106 mg/dL — ABNORMAL HIGH (ref 70–99)

## 2021-11-19 SURGERY — PHACOEMULSIFICATION, CATARACT, WITH IOL INSERTION
Anesthesia: Monitor Anesthesia Care | Site: Eye | Laterality: Left

## 2021-11-19 MED ORDER — BRIMONIDINE TARTRATE-TIMOLOL 0.2-0.5 % OP SOLN
OPHTHALMIC | Status: DC | PRN
  Administered 2021-11-19: 1 [drp] via OPHTHALMIC

## 2021-11-19 MED ORDER — SIGHTPATH DOSE#1 NA CHONDROIT SULF-NA HYALURON 40-17 MG/ML IO SOLN
INTRAOCULAR | Status: DC | PRN
  Administered 2021-11-19: 1 mL via INTRAOCULAR

## 2021-11-19 MED ORDER — SIGHTPATH DOSE#1 BSS IO SOLN
INTRAOCULAR | Status: DC | PRN
  Administered 2021-11-19: 67 mL via OPHTHALMIC

## 2021-11-19 MED ORDER — ACETAMINOPHEN 160 MG/5ML PO SOLN: 325.0000 mg | Freq: Once | ORAL | Status: DC

## 2021-11-19 MED ORDER — ACETAMINOPHEN 325 MG PO TABS: 325.0000 mg | ORAL_TABLET | Freq: Once | ORAL | Status: DC

## 2021-11-19 MED ORDER — SIGHTPATH DOSE#1 BSS IO SOLN
INTRAOCULAR | Status: DC | PRN
  Administered 2021-11-19: 1 mL via INTRAMUSCULAR

## 2021-11-19 MED ORDER — CEFUROXIME OPHTHALMIC INJECTION 1 MG/0.1 ML
INJECTION | OPHTHALMIC | Status: DC | PRN
  Administered 2021-11-19: 0.1 mL via INTRACAMERAL

## 2021-11-19 MED ORDER — TETRACAINE HCL 0.5 % OP SOLN
1.0000 [drp] | OPHTHALMIC | Status: DC | PRN
  Administered 2021-11-19 (×3): 1 [drp] via OPHTHALMIC

## 2021-11-19 MED ORDER — ARMC OPHTHALMIC DILATING DROPS
1.0000 "application " | OPHTHALMIC | Status: DC | PRN
  Administered 2021-11-19 (×3): 1 via OPHTHALMIC

## 2021-11-19 MED ORDER — SIGHTPATH DOSE#1 BSS IO SOLN
INTRAOCULAR | Status: DC | PRN
  Administered 2021-11-19: 15 mL

## 2021-11-19 MED ORDER — FENTANYL CITRATE (PF) 100 MCG/2ML IJ SOLN
INTRAMUSCULAR | Status: DC | PRN
  Administered 2021-11-19: 25 ug via INTRAVENOUS

## 2021-11-19 MED ORDER — LACTATED RINGERS IV SOLN: INTRAVENOUS | Status: DC

## 2021-11-19 SURGICAL SUPPLY — 10 items
CATARACT SUITE SIGHTPATH (MISCELLANEOUS) ×2 IMPLANT
FEE CATARACT SUITE SIGHTPATH (MISCELLANEOUS) ×1 IMPLANT
GLOVE SURG ENC TEXT LTX SZ8 (GLOVE) ×2 IMPLANT
GLOVE SURG TRIUMPH 8.0 PF LTX (GLOVE) ×2 IMPLANT
LENS IOL TECNIS EYHANCE 19.5 (Intraocular Lens) ×1 IMPLANT
NDL FILTER BLUNT 18X1 1/2 (NEEDLE) ×1 IMPLANT
NEEDLE FILTER BLUNT 18X 1/2SAF (NEEDLE) ×1
NEEDLE FILTER BLUNT 18X1 1/2 (NEEDLE) ×1 IMPLANT
SYR 3ML LL SCALE MARK (SYRINGE) ×2 IMPLANT
WATER STERILE IRR 250ML POUR (IV SOLUTION) ×2 IMPLANT

## 2021-11-19 NOTE — Anesthesia Preprocedure Evaluation (Signed)
Anesthesia Evaluation  Patient identified by MRN, date of birth, ID band Patient awake    Reviewed: Allergy & Precautions, NPO status   Airway Mallampati: II  TM Distance: >3 FB     Dental   Pulmonary asthma , former smoker,    Pulmonary exam normal        Cardiovascular hypertension, + CAD, + Past MI and +CHF  + dysrhythmias (PAF) + pacemaker + Cardiac Defibrillator  Rhythm:Regular Rate:Normal  AAA  TTE 2018 MODERATE-TO-SEVERE LV SYSTOLIC DYSFUNCTION WITH AN ESTIMATED EF = 25-35 %  NORMAL RIGHT VENTRICULAR SYSTOLIC FUNCTION  MODERATE TRICUSPID AND MITRAL VALVE INSUFFICIENCY  TRACE AORTIC VALVE INSUFFICIENCY  NO VALVULAR STENOSIS  MODERATE LV ENLARGEMENT  MODERATE RV ENLARGEMENT  MODERATE BIATRIAL ENLARGEMENT     Neuro/Psych Anxiety CVA (hemorrhagic, after a fall)    GI/Hepatic   Endo/Other  diabetes, Type 2  Renal/GU      Musculoskeletal  (+) Arthritis ,   Abdominal   Peds  Hematology   Anesthesia Other Findings   Reproductive/Obstetrics                             Anesthesia Physical  Anesthesia Plan  ASA: 4  Anesthesia Plan: MAC   Post-op Pain Management: Minimal or no pain anticipated   Induction: Intravenous  PONV Risk Score and Plan: 1 and TIVA, Midazolam and Treatment may vary due to age or medical condition  Airway Management Planned: Natural Airway and Nasal Cannula  Additional Equipment:   Intra-op Plan:   Post-operative Plan:   Informed Consent: I have reviewed the patients History and Physical, chart, labs and discussed the procedure including the risks, benefits and alternatives for the proposed anesthesia with the patient or authorized representative who has indicated his/her understanding and acceptance.     Consent reviewed with POA  Plan Discussed with: CRNA  Anesthesia Plan Comments:         Anesthesia Quick Evaluation

## 2021-11-19 NOTE — H&P (Signed)
[place]   Primary Care Physician:  Derinda Late, MD Ophthalmologist: Dr. George Ina  Pre-Procedure History & Physical: HPI:  Nathaniel Woods is a 88 y.o. male here for cataract surgery.   Past Medical History:  Diagnosis Date   AAA (abdominal aortic aneurysm) without rupture (HCC)    AICD (automatic cardioverter/defibrillator) present 12/17/2016   Angina    Anxiety    "just before major surgery"   Arthritis    Asthma    childhood   Basal cell carcinoma 10/07/2021   left superior ear, schedule surgery   BPH associated with nocturia    Chronic systolic CHF (congestive heart failure) ([place])    Coronary artery disease    Hemorrhagic stroke ([place]) 09/07/2008   S/P fall   HOH (hard of hearing)    Hyperlipidemia    Hypertension    Inferior MI ([place]) 10/08/2011   Kidney stone    PAF (paroxysmal atrial fibrillation) ([place])    Sinus bradycardia    Stroke ([place])    Type II diabetes mellitus ([place])    Improved with weight loss   Wears hearing aid in both ears     Past Surgical History:  Procedure Laterality Date   ABDOMINAL AORTIC ANEURYSM REPAIR  2013   APPENDECTOMY  06/09/1945   CARDIAC CATHETERIZATION  10/08/2011   CATARACT EXTRACTION W/PHACO Right 11/05/2021   Procedure: CATARACT EXTRACTION PHACO AND INTRAOCULAR LENS PLACEMENT ([place]) RIGHT;  Surgeon: Birder Robson, MD;  Location: [facility];  Service: Ophthalmology;  Laterality: Right;  6.83 0:45.5   CORONARY ANGIOPLASTY WITH STENT PLACEMENT  06/10/2007   "2; made total of 6"   CORONARY ARTERY BYPASS GRAFT  10/24/2011   Procedure: CORONARY ARTERY BYPASS GRAFTING (CABG);  Surgeon: Ivin Poot, MD;  Location: [facility];  Service: Open Heart Surgery;  Laterality: N/A;  Coronary Artery Bypass Graft times four using the left internal mammary artery and the right and left greater saphenous veins harvested endoscopically.  transesophageal Echocardiogram   ICD IMPLANT  12/17/2016   INTERTROCHANTERIC HIP FRACTURE  SURGERY  05/10/1991   "pin, 2 screws, plate"; right   LITHOTRIPSY  ~ 04/2011   TONSILLECTOMY  06/09/1942   VASECTOMY  ~ 1970    Prior to Admission medications   Medication Sig Start Date End Date Taking? Authorizing Provider  Cholecalciferol (VITAMIN D3) 50 MCG (2000 UT) TABS Take by mouth daily.   Yes [provider]  citalopram (CELEXA) 20 MG tablet Take 20 mg by mouth daily.   Yes [provider]  Coenzyme Q10 (COQ10 PO) Take 1 Dose by mouth daily.    Yes [provider]  ELIQUIS 5 MG TABS tablet Take 5 mg by mouth 2 (two) times daily.  06/10/17  Yes [provider]  finasteride (PROSCAR) 5 MG tablet Take 5 mg by mouth daily.   Yes [provider]  fluticasone (FLONASE) 50 MCG/ACT nasal spray Place 2 sprays into both nostrils daily.  04/21/16  Yes [provider]  furosemide (LASIX) 20 MG tablet Take 20 mg by mouth daily.  08/03/17  Yes [provider]  losartan (COZAAR) 50 MG tablet Take 50 mg by mouth daily.   Yes [provider]  metFORMIN (GLUCOPHAGE) 500 MG tablet Take 500 mg by mouth daily with breakfast.   Yes [provider]  metoprolol succinate (TOPROL-XL) 50 MG 24 hr tablet Take 50 mg by mouth daily.  06/10/17  Yes [provider]  mirtazapine (REMERON) 15 MG tablet Take 15  mg by mouth at bedtime.   Yes [provider]  montelukast (SINGULAIR) 10 MG tablet Take 10 mg by mouth at bedtime.  06/19/17  Yes [provider]  Multiple Vitamin (MULITIVITAMIN WITH MINERALS) TABS Take 1 tablet by mouth daily.   Yes [provider]  Omega-3 Fatty Acids (FISH OIL) 1200 MG CAPS Take 1 capsule by mouth daily.    Yes [provider]  Rosuvastatin Calcium (CRESTOR PO) Take 5 mg by mouth every Monday, Wednesday, and Friday.    Yes [provider]  spironolactone (ALDACTONE) 25 MG tablet Take 12.5 mg by mouth daily.   Yes [provider]  acetaminophen  (TYLENOL) 325 MG tablet Take 2 tablets (650 mg total) by mouth every 6 (six) hours as needed for mild pain (or Fever >/= 101). 03/30/19   Gouru, Illene Silver, MD  latanoprost (XALATAN) 0.005 % ophthalmic solution Place 1 drop into both eyes at bedtime.  07/21/17   [provider]  mupirocin ointment (BACTROBAN) 2 % Apply 1 application. topically daily. With dressing changes 10/15/21   Ralene Bathe, MD  tacrolimus (PROTOPIC) 0.1 % ointment Apply topically daily. Apply to pressure wound on buttocks QD after cleaning area with wound irrigation spray and patting dry. 10/21/21   Ralene Bathe, MD    Allergies as of 10/11/2021 - Review Complete 03/28/2019  Allergen Reaction Noted   Levofloxacin Rash 11/24/2014   Ambien [zolpidem tartrate] Other (See Comments) 03/30/2019   Aspirin Other (See Comments) 10/19/2011   Bee venom Other (See Comments) 12/16/2016   Chlorpheniramine Other (See Comments) 12/19/2016   Decongestant [pseudoephedrine hcl] Other (See Comments) 10/19/2011   Simvastatin Other (See Comments) 10/20/2013   Zolpidem Other (See Comments) 10/20/2013   Contrast media [iodinated contrast media] Rash 10/19/2011    Family History  Problem Relation Age of Onset   Breast cancer Sister     Social History   Socioeconomic History   Marital status: Married    Spouse name: Not on file   Number of children: Not on file   Years of education: Not on file   Highest education level: Not on file  Occupational History   Not on file  Tobacco Use   Smoking status: Former    Packs/day: 1.00    Years: 24.00    Total pack years: 24.00    Types: Cigarettes    Quit date: 06/09/1970    Years since quitting: 51.4   Smokeless tobacco: Never  Vaping Use   Vaping Use: Never used  Substance and Sexual Activity   Alcohol use: Not Currently    Alcohol/week: 28.0 standard drinks of alcohol    Types: 28 Shots of liquor per week    Comment: None for several years   Drug use: No   Sexual  activity: Never  Other Topics Concern   Not on file  Social History Narrative   Not on file   Social Determinants of Health   Financial Resource Strain: Not on file  Food Insecurity: Not on file  Transportation Needs: Not on file  Physical Activity: Not on file  Stress: Not on file  Social Connections: Not on file  Intimate Partner Violence: Not on file    Review of Systems: See HPI, otherwise negative ROS  Physical Exam: BP 130/60   Pulse 62   Temp (!) 97.3 F (36.3 C) (Temporal)   Ht 6' (1.829 m)   Wt 77 kg   SpO2 98%   BMI 23.03 kg/m  General:   Alert, cooperative in NAD Head:  Normocephalic and atraumatic. Respiratory:  Normal work of breathing. Cardiovascular:  RRR  Impression/Plan: Nathaniel Woods is here for cataract surgery.  Risks, benefits, limitations, and alternatives regarding cataract surgery have been reviewed with the patient.  Questions have been answered.  All parties agreeable.   Birder Robson, MD  11/19/2021, 9:24 AM

## 2021-11-19 NOTE — Transfer of Care (Signed)
Immediate Anesthesia Transfer of Care Note  Patient: Nathaniel Woods  Procedure(s) Performed: CATARACT EXTRACTION PHACO AND INTRAOCULAR LENS PLACEMENT (IOC) LEFT 11.02 01:02.8 (Left: Eye)  Patient Location: PACU  Anesthesia Type: MAC  Level of Consciousness: awake, alert  and patient cooperative  Airway and Oxygen Therapy: Patient Spontanous Breathing and Patient connected to supplemental oxygen  Post-op Assessment: Post-op Vital signs reviewed, Patient's Cardiovascular Status Stable, Respiratory Function Stable, Patent Airway and No signs of Nausea or vomiting  Post-op Vital Signs: Reviewed and stable  Complications: No notable events documented.

## 2021-11-19 NOTE — Op Note (Signed)
PREOPERATIVE DIAGNOSIS:  Nuclear sclerotic cataract of the left eye.   POSTOPERATIVE DIAGNOSIS:  Nuclear sclerotic cataract of the left eye.   OPERATIVE PROCEDURE:ORPROCALL@   SURGEON:  Birder Robson, MD.   ANESTHESIA:  Anesthesiologist: Ronelle Nigh, MD CRNA: Dionne Bucy, CRNA  1.      Managed anesthesia care. 2.     0.52m of Shugarcaine was instilled following the paracentesis   COMPLICATIONS:  None.   TECHNIQUE:   Stop and chop   DESCRIPTION OF PROCEDURE:  The patient was examined and consented in the preoperative holding area where the aforementioned topical anesthesia was applied to the left eye and then brought back to the Operating Room where the left eye was prepped and draped in the usual sterile ophthalmic fashion and a lid speculum was placed. A paracentesis was created with the side port blade and the anterior chamber was filled with viscoelastic. A near clear corneal incision was performed with the steel keratome. A continuous curvilinear capsulorrhexis was performed with a cystotome followed by the capsulorrhexis forceps. Hydrodissection and hydrodelineation were carried out with BSS on a blunt cannula. The lens was removed in a stop and chop  technique and the remaining cortical material was removed with the irrigation-aspiration handpiece. The capsular bag was inflated with viscoelastic and the Technis ZCB00 lens was placed in the capsular bag without complication. The remaining viscoelastic was removed from the eye with the irrigation-aspiration handpiece. The wounds were hydrated. The anterior chamber was flushed with BSS and the eye was inflated to physiologic pressure. 0.152mVigamox was placed in the anterior chamber. The wounds were found to be water tight. The eye was dressed with Combigan. The patient was given protective glasses to wear throughout the day and a shield with which to sleep tonight. The patient was also given drops with which to begin a drop regimen  today and will follow-up with me in one day. Implant Name Type Inv. Item Serial No. Manufacturer Lot No. LRB No. Used Action  LENS IOL TECNIS EYHANCE 19.5 - [id number] Lens LENS IOL TECNIS EYHANCE 19.5 [id number]  Left 1 Implanted    Procedure(s): CATARACT EXTRACTION PHACO AND INTRAOCULAR LENS PLACEMENT (IOC) LEFT 11.02 01:02.8 (Left)  Electronically signed: WiBirder Robson/13/2023 9:55 AM

## 2021-11-19 NOTE — Anesthesia Postprocedure Evaluation (Signed)
Anesthesia Post Note  Patient: Nathaniel Woods  Procedure(s) Performed: CATARACT EXTRACTION PHACO AND INTRAOCULAR LENS PLACEMENT (IOC) LEFT 11.02 01:02.8 (Left: Eye)     Patient location during evaluation: PACU Anesthesia Type: MAC Level of consciousness: awake and alert and oriented Pain management: satisfactory to patient Vital Signs Assessment: post-procedure vital signs reviewed and stable Respiratory status: spontaneous breathing, nonlabored ventilation and respiratory function stable Cardiovascular status: blood pressure returned to baseline and stable Postop Assessment: Adequate PO intake and No signs of nausea or vomiting Anesthetic complications: no   No notable events documented.  Raliegh Ip

## 2021-11-19 NOTE — Anesthesia Procedure Notes (Signed)
Procedure Name: MAC Date/Time: 11/19/2021 9:34 AM  Performed by: Dionne Bucy, CRNAPre-anesthesia Checklist: Patient identified, Emergency Drugs available, Suction available, Patient being monitored and Timeout performed Patient Re-evaluated:Patient Re-evaluated prior to induction Oxygen Delivery Method: Nasal cannula Placement Confirmation: positive ETCO2

## 2021-11-20 ENCOUNTER — Encounter: Payer: Self-pay | Admitting: Ophthalmology

## 2021-11-25 ENCOUNTER — Ambulatory Visit: Payer: PPO | Admitting: Dermatology

## 2021-12-16 DIAGNOSIS — N1831 Chronic kidney disease, stage 3a: Secondary | ICD-10-CM | POA: Diagnosis not present

## 2021-12-16 DIAGNOSIS — E7800 Pure hypercholesterolemia, unspecified: Secondary | ICD-10-CM | POA: Diagnosis not present

## 2021-12-16 DIAGNOSIS — E1159 Type 2 diabetes mellitus with other circulatory complications: Secondary | ICD-10-CM | POA: Diagnosis not present

## 2021-12-16 DIAGNOSIS — I152 Hypertension secondary to endocrine disorders: Secondary | ICD-10-CM | POA: Diagnosis not present

## 2021-12-16 DIAGNOSIS — Z79899 Other long term (current) drug therapy: Secondary | ICD-10-CM | POA: Diagnosis not present

## 2021-12-23 DIAGNOSIS — N1831 Chronic kidney disease, stage 3a: Secondary | ICD-10-CM | POA: Diagnosis not present

## 2021-12-23 DIAGNOSIS — I480 Paroxysmal atrial fibrillation: Secondary | ICD-10-CM | POA: Diagnosis not present

## 2021-12-23 DIAGNOSIS — I10 Essential (primary) hypertension: Secondary | ICD-10-CM | POA: Diagnosis not present

## 2021-12-23 DIAGNOSIS — Z1331 Encounter for screening for depression: Secondary | ICD-10-CM | POA: Diagnosis not present

## 2021-12-23 DIAGNOSIS — I714 Abdominal aortic aneurysm, without rupture, unspecified: Secondary | ICD-10-CM | POA: Diagnosis not present

## 2021-12-23 DIAGNOSIS — Z79899 Other long term (current) drug therapy: Secondary | ICD-10-CM | POA: Diagnosis not present

## 2021-12-23 DIAGNOSIS — E1159 Type 2 diabetes mellitus with other circulatory complications: Secondary | ICD-10-CM | POA: Diagnosis not present

## 2021-12-23 DIAGNOSIS — Z0000 Encounter for general adult medical examination without abnormal findings: Secondary | ICD-10-CM | POA: Diagnosis not present

## 2021-12-23 DIAGNOSIS — I152 Hypertension secondary to endocrine disorders: Secondary | ICD-10-CM | POA: Diagnosis not present

## 2021-12-23 DIAGNOSIS — E7800 Pure hypercholesterolemia, unspecified: Secondary | ICD-10-CM | POA: Diagnosis not present

## 2021-12-23 DIAGNOSIS — I5022 Chronic systolic (congestive) heart failure: Secondary | ICD-10-CM | POA: Diagnosis not present

## 2022-01-14 DIAGNOSIS — I5022 Chronic systolic (congestive) heart failure: Secondary | ICD-10-CM | POA: Diagnosis not present

## 2022-02-20 DIAGNOSIS — R8290 Unspecified abnormal findings in urine: Secondary | ICD-10-CM | POA: Diagnosis not present

## 2022-02-20 DIAGNOSIS — R410 Disorientation, unspecified: Secondary | ICD-10-CM | POA: Diagnosis not present

## 2022-02-23 ENCOUNTER — Inpatient Hospital Stay
Admission: EM | Admit: 2022-02-23 | Discharge: 2022-02-27 | DRG: 689 | Disposition: A | Discharge status: Home Health | Payer: PPO | Attending: Internal Medicine | Admitting: Internal Medicine

## 2022-02-23 ENCOUNTER — Emergency Department: Payer: PPO

## 2022-02-23 ENCOUNTER — Other Ambulatory Visit: Payer: Self-pay

## 2022-02-23 DIAGNOSIS — I130 Hypertensive heart and chronic kidney disease with heart failure and stage 1 through stage 4 chronic kidney disease, or unspecified chronic kidney disease: Secondary | ICD-10-CM | POA: Diagnosis not present

## 2022-02-23 DIAGNOSIS — E860 Dehydration: Secondary | ICD-10-CM | POA: Diagnosis not present

## 2022-02-23 DIAGNOSIS — M4854XA Collapsed vertebra, not elsewhere classified, thoracic region, initial encounter for fracture: Secondary | ICD-10-CM | POA: Diagnosis present

## 2022-02-23 DIAGNOSIS — I10 Essential (primary) hypertension: Secondary | ICD-10-CM | POA: Diagnosis not present

## 2022-02-23 DIAGNOSIS — I480 Paroxysmal atrial fibrillation: Secondary | ICD-10-CM | POA: Diagnosis not present

## 2022-02-23 DIAGNOSIS — Z9841 Cataract extraction status, right eye: Secondary | ICD-10-CM

## 2022-02-23 DIAGNOSIS — D696 Thrombocytopenia, unspecified: Secondary | ICD-10-CM | POA: Diagnosis present

## 2022-02-23 DIAGNOSIS — Z7901 Long term (current) use of anticoagulants: Secondary | ICD-10-CM | POA: Diagnosis not present

## 2022-02-23 DIAGNOSIS — Z91030 Bee allergy status: Secondary | ICD-10-CM

## 2022-02-23 DIAGNOSIS — N179 Acute kidney failure, unspecified: Secondary | ICD-10-CM | POA: Insufficient documentation

## 2022-02-23 DIAGNOSIS — Z9842 Cataract extraction status, left eye: Secondary | ICD-10-CM

## 2022-02-23 DIAGNOSIS — I5022 Chronic systolic (congestive) heart failure: Secondary | ICD-10-CM | POA: Diagnosis present

## 2022-02-23 DIAGNOSIS — Z91041 Radiographic dye allergy status: Secondary | ICD-10-CM

## 2022-02-23 DIAGNOSIS — M4856XA Collapsed vertebra, not elsewhere classified, lumbar region, initial encounter for fracture: Secondary | ICD-10-CM | POA: Diagnosis present

## 2022-02-23 DIAGNOSIS — I714 Abdominal aortic aneurysm, without rupture, unspecified: Secondary | ICD-10-CM | POA: Diagnosis not present

## 2022-02-23 DIAGNOSIS — N1831 Chronic kidney disease, stage 3a: Secondary | ICD-10-CM | POA: Insufficient documentation

## 2022-02-23 DIAGNOSIS — E1122 Type 2 diabetes mellitus with diabetic chronic kidney disease: Secondary | ICD-10-CM | POA: Diagnosis not present

## 2022-02-23 DIAGNOSIS — N281 Cyst of kidney, acquired: Secondary | ICD-10-CM | POA: Diagnosis not present

## 2022-02-23 DIAGNOSIS — R4182 Altered mental status, unspecified: Secondary | ICD-10-CM | POA: Diagnosis not present

## 2022-02-23 DIAGNOSIS — R531 Weakness: Secondary | ICD-10-CM | POA: Diagnosis not present

## 2022-02-23 DIAGNOSIS — R001 Bradycardia, unspecified: Secondary | ICD-10-CM | POA: Diagnosis not present

## 2022-02-23 DIAGNOSIS — N390 Urinary tract infection, site not specified: Secondary | ICD-10-CM | POA: Diagnosis not present

## 2022-02-23 DIAGNOSIS — I252 Old myocardial infarction: Secondary | ICD-10-CM

## 2022-02-23 DIAGNOSIS — E1159 Type 2 diabetes mellitus with other circulatory complications: Secondary | ICD-10-CM | POA: Diagnosis not present

## 2022-02-23 DIAGNOSIS — Z888 Allergy status to other drugs, medicaments and biological substances status: Secondary | ICD-10-CM

## 2022-02-23 DIAGNOSIS — Z8673 Personal history of transient ischemic attack (TIA), and cerebral infarction without residual deficits: Secondary | ICD-10-CM

## 2022-02-23 DIAGNOSIS — H9190 Unspecified hearing loss, unspecified ear: Secondary | ICD-10-CM | POA: Diagnosis present

## 2022-02-23 DIAGNOSIS — R5381 Other malaise: Secondary | ICD-10-CM | POA: Diagnosis not present

## 2022-02-23 DIAGNOSIS — Z20822 Contact with and (suspected) exposure to covid-19: Secondary | ICD-10-CM | POA: Diagnosis not present

## 2022-02-23 DIAGNOSIS — Z951 Presence of aortocoronary bypass graft: Secondary | ICD-10-CM | POA: Diagnosis not present

## 2022-02-23 DIAGNOSIS — Z87891 Personal history of nicotine dependence: Secondary | ICD-10-CM

## 2022-02-23 DIAGNOSIS — Z85828 Personal history of other malignant neoplasm of skin: Secondary | ICD-10-CM | POA: Diagnosis not present

## 2022-02-23 DIAGNOSIS — E872 Acidosis, unspecified: Secondary | ICD-10-CM | POA: Diagnosis present

## 2022-02-23 DIAGNOSIS — I2510 Atherosclerotic heart disease of native coronary artery without angina pectoris: Secondary | ICD-10-CM | POA: Diagnosis present

## 2022-02-23 DIAGNOSIS — Z95810 Presence of automatic (implantable) cardiac defibrillator: Secondary | ICD-10-CM

## 2022-02-23 DIAGNOSIS — E785 Hyperlipidemia, unspecified: Secondary | ICD-10-CM | POA: Diagnosis not present

## 2022-02-23 DIAGNOSIS — G9341 Metabolic encephalopathy: Secondary | ICD-10-CM | POA: Diagnosis not present

## 2022-02-23 DIAGNOSIS — Z961 Presence of intraocular lens: Secondary | ICD-10-CM | POA: Diagnosis present

## 2022-02-23 DIAGNOSIS — E119 Type 2 diabetes mellitus without complications: Secondary | ICD-10-CM

## 2022-02-23 DIAGNOSIS — R262 Difficulty in walking, not elsewhere classified: Secondary | ICD-10-CM | POA: Diagnosis present

## 2022-02-23 DIAGNOSIS — Z79899 Other long term (current) drug therapy: Secondary | ICD-10-CM

## 2022-02-23 DIAGNOSIS — N3000 Acute cystitis without hematuria: Secondary | ICD-10-CM | POA: Diagnosis not present

## 2022-02-23 DIAGNOSIS — R2689 Other abnormalities of gait and mobility: Secondary | ICD-10-CM | POA: Diagnosis not present

## 2022-02-23 DIAGNOSIS — Z974 Presence of external hearing-aid: Secondary | ICD-10-CM

## 2022-02-23 LAB — CBC WITH DIFFERENTIAL/PLATELET
Abs Immature Granulocytes: 0.06 10*3/uL (ref 0.00–0.07)
Basophils Absolute: 0.1 10*3/uL (ref 0.0–0.1)
Basophils Relative: 1 %
Eosinophils Absolute: 0.3 10*3/uL (ref 0.0–0.5)
Eosinophils Relative: 3 %
HCT: 34 % — ABNORMAL LOW (ref 39.0–52.0)
Hemoglobin: 11.3 g/dL — ABNORMAL LOW (ref 13.0–17.0)
Immature Granulocytes: 1 %
Lymphocytes Relative: 13 %
Lymphs Abs: 1.5 10*3/uL (ref 0.7–4.0)
MCH: 32.4 pg (ref 26.0–34.0)
MCHC: 33.2 g/dL (ref 30.0–36.0)
MCV: 97.4 fL (ref 80.0–100.0)
Monocytes Absolute: 1.2 10*3/uL — ABNORMAL HIGH (ref 0.1–1.0)
Monocytes Relative: 10 %
Neutro Abs: 8.4 10*3/uL — ABNORMAL HIGH (ref 1.7–7.7)
Neutrophils Relative %: 72 %
Platelets: 163 10*3/uL (ref 150–400)
RBC: 3.49 MIL/uL — ABNORMAL LOW (ref 4.22–5.81)
RDW: 13.9 % (ref 11.5–15.5)
WBC: 11.5 10*3/uL — ABNORMAL HIGH (ref 4.0–10.5)
nRBC: 0 % (ref 0.0–0.2)

## 2022-02-23 LAB — COMPREHENSIVE METABOLIC PANEL
ALT: 11 U/L (ref 0–44)
AST: 17 U/L (ref 15–41)
Albumin: 3.8 g/dL (ref 3.5–5.0)
Alkaline Phosphatase: 60 U/L (ref 38–126)
Anion gap: 8 (ref 5–15)
BUN: 45 mg/dL — ABNORMAL HIGH (ref 8–23)
CO2: 21 mmol/L — ABNORMAL LOW (ref 22–32)
Calcium: 9.2 mg/dL (ref 8.9–10.3)
Chloride: 110 mmol/L (ref 98–111)
Creatinine, Ser: 1.67 mg/dL — ABNORMAL HIGH (ref 0.61–1.24)
GFR, Estimated: 38 mL/min — ABNORMAL LOW (ref >60)
Glucose, Bld: 100 mg/dL — ABNORMAL HIGH (ref 70–99)
Potassium: 4.2 mmol/L (ref 3.5–5.1)
Sodium: 139 mmol/L (ref 135–145)
Total Bilirubin: 0.9 mg/dL (ref 0.3–1.2)
Total Protein: 7 g/dL (ref 6.5–8.1)

## 2022-02-23 LAB — BLOOD GAS, VENOUS
Acid-base deficit: 0.4 mmol/L (ref 0.0–2.0)
Bicarbonate: 22.9 mmol/L (ref 20.0–28.0)
O2 Saturation: 32.6 %
Patient temperature: 37
pCO2, Ven: 33 mmHg — ABNORMAL LOW (ref 44–60)
pH, Ven: 7.45 — ABNORMAL HIGH (ref 7.25–7.43)
pO2, Ven: <31 mmHg — CL (ref 32–45)

## 2022-02-23 LAB — LACTIC ACID, PLASMA
Lactic Acid, Venous: 1.5 mmol/L (ref 0.5–1.9)
Lactic Acid, Venous: 1.1 mmol/L (ref 0.5–1.9)

## 2022-02-23 LAB — LIPASE, BLOOD: Lipase: 55 U/L — ABNORMAL HIGH (ref 11–51)

## 2022-02-23 LAB — PROCALCITONIN: Procalcitonin: <0.1 ng/mL

## 2022-02-23 LAB — SARS CORONAVIRUS 2 BY RT PCR: SARS Coronavirus 2 by RT PCR: NEGATIVE

## 2022-02-23 MED ORDER — LACTATED RINGERS IV BOLUS
1000.0000 mL | Freq: Once | INTRAVENOUS | Status: AC
  Administered 2022-02-23: 1000 mL via INTRAVENOUS

## 2022-02-23 MED ORDER — ONDANSETRON HCL 4 MG PO TABS: 4.0000 mg | ORAL_TABLET | Freq: Four times a day (QID) | ORAL | Status: DC | PRN

## 2022-02-23 MED ORDER — INSULIN ASPART 100 UNIT/ML IJ SOLN
0.0000 [IU] | INTRAMUSCULAR | Status: DC
  Administered 2022-02-24: 1 [IU] via SUBCUTANEOUS
  Administered 2022-02-26: 3 [IU] via SUBCUTANEOUS
  Filled 2022-02-23 (×2): qty 1

## 2022-02-23 MED ORDER — FUROSEMIDE 20 MG PO TABS: 20.0000 mg | ORAL_TABLET | Freq: Every day | ORAL | Status: DC

## 2022-02-23 MED ORDER — SODIUM CHLORIDE 0.9 % IV SOLN
2.0000 g | Freq: Once | INTRAVENOUS | Status: AC
  Administered 2022-02-23: 2 g via INTRAVENOUS
  Filled 2022-02-23: qty 20

## 2022-02-23 MED ORDER — METOPROLOL SUCCINATE ER 50 MG PO TB24
50.0000 mg | ORAL_TABLET | Freq: Every day | ORAL | Status: DC
  Administered 2022-02-24 – 2022-02-27 (×3): 50 mg via ORAL
  Filled 2022-02-23 (×4): qty 1

## 2022-02-23 MED ORDER — SODIUM CHLORIDE 0.9 % IV SOLN: INTRAVENOUS | Status: DC

## 2022-02-23 MED ORDER — LOSARTAN POTASSIUM 50 MG PO TABS: 50.0000 mg | ORAL_TABLET | Freq: Every day | ORAL | Status: DC

## 2022-02-23 MED ORDER — ONDANSETRON HCL 4 MG/2ML IJ SOLN: 4.0000 mg | Freq: Four times a day (QID) | INTRAMUSCULAR | Status: DC | PRN

## 2022-02-23 MED ORDER — ACETAMINOPHEN 325 MG PO TABS
650.0000 mg | ORAL_TABLET | Freq: Four times a day (QID) | ORAL | Status: DC | PRN
  Administered 2022-02-27: 650 mg via ORAL
  Filled 2022-02-23: qty 2

## 2022-02-23 MED ORDER — ROSUVASTATIN CALCIUM 5 MG PO TABS
5.0000 mg | ORAL_TABLET | ORAL | Status: DC
  Administered 2022-02-24 – 2022-02-26 (×2): 5 mg via ORAL
  Filled 2022-02-23 (×2): qty 1

## 2022-02-23 MED ORDER — CITALOPRAM HYDROBROMIDE 10 MG PO TABS
20.0000 mg | ORAL_TABLET | Freq: Every day | ORAL | Status: DC
  Administered 2022-02-24 – 2022-02-27 (×3): 20 mg via ORAL
  Filled 2022-02-23 (×5): qty 2

## 2022-02-23 MED ORDER — ACETAMINOPHEN 650 MG RE SUPP: 650.0000 mg | Freq: Four times a day (QID) | RECTAL | Status: DC | PRN

## 2022-02-23 MED ORDER — MIRTAZAPINE 15 MG PO TABS
15.0000 mg | ORAL_TABLET | Freq: Every day | ORAL | Status: DC
  Administered 2022-02-24 – 2022-02-26 (×3): 15 mg via ORAL
  Filled 2022-02-23 (×3): qty 1

## 2022-02-23 MED ORDER — APIXABAN 5 MG PO TABS
5.0000 mg | ORAL_TABLET | Freq: Two times a day (BID) | ORAL | Status: DC
  Administered 2022-02-24: 5 mg via ORAL
  Filled 2022-02-23: qty 1

## 2022-02-23 NOTE — Assessment & Plan Note (Signed)
CT abdomen and pelvis with no acute findings

## 2022-02-23 NOTE — Assessment & Plan Note (Signed)
No complaints of chest pain Continue metoprolol, statin and Eliquis

## 2022-02-23 NOTE — Assessment & Plan Note (Signed)
S/p AICD Clinically euvolemic but monitor for exacerbation given IV hydration We will continue losartan, metoprolol and furosemide and spironolactone Daily weights

## 2022-02-23 NOTE — Assessment & Plan Note (Signed)
Sliding scale insulin coverage Hold metformin 

## 2022-02-23 NOTE — Assessment & Plan Note (Addendum)
Creatinine 1.67 up from baseline of 1.2 Likely related to decreased oral intake, dehydration, mild ATN Hydrate and monitor.  Avoid nephrotoxins

## 2022-02-23 NOTE — ED Notes (Signed)
Called CN to inform pt tachypneic and needs to be roomed.

## 2022-02-23 NOTE — ED Provider Notes (Signed)
[facility] Provider Note    Event Date/Time   First MD Initiated Contact with Patient 02/23/22 1851     (approximate)   History   Chief Complaint Altered Mental Status   HPI  Nathaniel Woods is a 88 y.o. male with past medical history of hypertension, hyperlipidemia, CAD status post CABG, CHF, ventricular fibrillation, diabetes, stroke, and AAA who presents to the ED complaining of altered mental status.  History is obtained from patient's son at bedside due to patient's confusion.  He states that the patient has been declining over the past couple of weeks, but has gotten acutely worse over the past 2 days.  He deals with some mild confusion at baseline, but has been getting significantly worse recently with frequent diarrhea.  He has also been generally weaker than usual, having difficulty walking or doing his usual activities at home.  He was diagnosed with a UTI by his PCP and reportedly started on an antibiotic, however son does not remember the name of this antibiotic.  Son states that patient has continued to worsen despite being on this antibiotic for 3 days now.  He is not aware of any fevers and patient has not complained of any pain.  Son does state that patient has seemed to be breathing more rapidly today.     Physical Exam   Triage Vital Signs: ED Triage Vitals  Enc Vitals Group     BP 02/23/22 1824 (!) 128/50     Pulse Rate 02/23/22 1824 84     Resp 02/23/22 1824 (!) 22     Temp 02/23/22 1824 97.9 F (36.6 C)     Temp Source 02/23/22 1824 Oral     SpO2 02/23/22 1824 96 %     Weight 02/23/22 1832 170 lb (77.1 kg)     Height 02/23/22 1832 6' (1.829 m)     Head Circumference --      Peak Flow --      Pain Score --      Pain Loc --      Pain Edu? --      Excl. in [place]? --     Most recent vital signs: Vitals:   02/23/22 1838 02/23/22 2151  BP:  (!) 113/54  Pulse:  66  Resp: (!) 33 16  Temp:  98.2 F (36.8 C)  SpO2:  99%     Constitutional: Alert and oriented to person, place, time, but not situation. Eyes: Conjunctivae are normal.  Pupils equal, round, and reactive to light bilaterally. Head: Atraumatic. Nose: No congestion/rhinnorhea. Mouth/Throat: Mucous membranes are moist.  Cardiovascular: Normal rate, regular rhythm. Grossly normal heart sounds.  2+ radial pulses bilaterally. Respiratory: Tachypneic with increased respiratory effort.  No retractions. Lungs CTAB. Gastrointestinal: Soft and diffusely tender to palpation with no rebound or guarding. No distention. Musculoskeletal: No lower extremity tenderness nor edema.  Neurologic:  Normal speech and language.  Global weakness noted with no gross focal neurologic deficits appreciated.    ED Results / Procedures / Treatments   Labs (all labs ordered are listed, but only abnormal results are displayed) Labs Reviewed  COMPREHENSIVE METABOLIC PANEL - Abnormal; Notable for the following components:      Result Value   CO2 21 (*)    Glucose, Bld 100 (*)    BUN 45 (*)    Creatinine, Ser 1.67 (*)    GFR, Estimated 38 (*)    All other components within normal limits  CBC WITH  DIFFERENTIAL/PLATELET - Abnormal; Notable for the following components:   WBC 11.5 (*)    RBC 3.49 (*)    Hemoglobin 11.3 (*)    HCT 34.0 (*)    Neutro Abs 8.4 (*)    Monocytes Absolute 1.2 (*)    All other components within normal limits  LIPASE, BLOOD - Abnormal; Notable for the following components:   Lipase 55 (*)    All other components within normal limits  BLOOD GAS, VENOUS - Abnormal; Notable for the following components:   pH, Ven 7.45 (*)    pCO2, Ven 33 (*)    pO2, Ven <31 (*)    All other components within normal limits  SARS CORONAVIRUS 2 BY RT PCR  CULTURE, BLOOD (ROUTINE X 2)  CULTURE, BLOOD (ROUTINE X 2)  LACTIC ACID, PLASMA  LACTIC ACID, PLASMA  PROCALCITONIN  URINALYSIS, ROUTINE W REFLEX MICROSCOPIC  HEMOGLOBIN A1C  CBC  BASIC METABOLIC PANEL      EKG  ED ECG REPORT I, Blake Divine, the attending physician, personally viewed and interpreted this ECG.   Date: 02/23/2022  EKG Time: 18:39  Rate: 65  Rhythm: Ventricular paced rhythm  Axis: Normal  Intervals:nonspecific intraventricular conduction delay  ST&T Change: None  RADIOLOGY Chest x-ray reviewed and interpreted by me with no infiltrate, edema, or effusion.  PROCEDURES:  Critical Care performed: No  Procedures   MEDICATIONS ORDERED IN ED: Medications  furosemide (LASIX) tablet 20 mg (has no administration in time range)  losartan (COZAAR) tablet 50 mg (has no administration in time range)  metoprolol succinate (TOPROL-XL) 24 hr tablet 50 mg (has no administration in time range)  rosuvastatin (CRESTOR) tablet 5 mg (has no administration in time range)  citalopram (CELEXA) tablet 20 mg (has no administration in time range)  mirtazapine (REMERON) tablet 15 mg (has no administration in time range)  apixaban (ELIQUIS) tablet 5 mg (has no administration in time range)  acetaminophen (TYLENOL) tablet 650 mg (has no administration in time range)    Or  acetaminophen (TYLENOL) suppository 650 mg (has no administration in time range)  ondansetron (ZOFRAN) tablet 4 mg (has no administration in time range)    Or  ondansetron (ZOFRAN) injection 4 mg (has no administration in time range)  insulin aspart (novoLOG) injection 0-9 Units (has no administration in time range)  0.9 %  sodium chloride infusion (has no administration in time range)  lactated ringers bolus 1,000 mL (0 mLs Intravenous Stopped 02/23/22 2054)  cefTRIAXone (ROCEPHIN) 2 g in sodium chloride 0.9 % 100 mL IVPB (0 g Intravenous Stopped 02/23/22 2314)  lactated ringers bolus 1,000 mL (1,000 mLs Intravenous New Bag/Given 02/23/22 2216)     IMPRESSION / MDM / ASSESSMENT AND PLAN / ED COURSE  I reviewed the triage vital signs and the nursing notes.                              88 y.o. male with past  medical history of hypertension, hyperlipidemia, CAD status post CABG, diabetes, CHF, stroke, and AAA who presents to the ED with increasing weakness and confusion over the past couple of weeks, acutely worsening over the past 2 days.  Patient's presentation is most consistent with acute presentation with potential threat to life or bodily function.  Differential diagnosis includes, but is not limited to, sepsis, UTI, pneumonia, CHF, ACS, cholecystitis, appendicitis, bowel obstruction, stroke, TIA, electrolyte abnormality.  Patient ill-appearing but in no acute distress,  vital signs remarkable for tachypnea but otherwise reassuring.  He appears globally weak but has no focal neurologic deficits, we will check CT head but low suspicion for stroke at this time.  Tachypnea raises suspicion for sepsis, chest x-ray appears unremarkable, no findings to suggest CHF and no wheezing noted on exam.  Labs remarkable for mild AKI with no significant electrolyte abnormality, patient does appear dehydrated and we will give IV fluid bolus.  No significant leukocytosis noted and lactic acid within normal limits.  He does appear diffusely tender on his abdominal exam, CT head and CT of abdomen/pelvis are pending at this time.  CT head is negative for acute process, CT of abdomen/pelvis shows known AAA with appropriate positioning of graft, negative for acute process as well.  Tachypnea seems to be improved following IV fluid hydration, urinalysis is pending at this time but suspect UTI as the source of his altered mental status given urinalysis from PCPs office consistent with UTI.  Nursing staff attempted to perform in and out catheterization for urine sample but was unsuccessful.  We will continue IV fluid hydration, no evidence of urinary retention at this time.  Case discussed with hospitalist for admission.      FINAL CLINICAL IMPRESSION(S) / ED DIAGNOSES   Final diagnoses:  Altered mental status, unspecified  altered mental status type     Rx / DC Orders   ED Discharge Orders     None        Note:  This document was prepared using Dragon voice recognition software and may include unintentional dictation errors.   Blake Divine, MD 02/23/22 [phone]

## 2022-02-23 NOTE — ED Triage Notes (Signed)
Per EMS report, family states patient has become more weak, incontinent and altered from baseline. EMT states VSS and patient is alert, but confused.

## 2022-02-23 NOTE — Assessment & Plan Note (Signed)
Secondary to acute infection CT head with no acute findings Fall and aspiration precautions We will keep n.p.o. tonight Delirium precautions --Get up during the day --Keep blinds open and lights on during daylight hours --Minimize the use of opioids/benzodiazepines --Reorient the patient frequently, provide easily visible clock and calendar --Provide sensory aids like glasses, hearing aids --Encourage ambulation, regular activities and visitors to maintain cognitive stimulation  --Patient would benefit from having family members at bedside to reinforce his orientation.

## 2022-02-23 NOTE — ED Notes (Signed)
Patient remains on stretcher in triage, waiting to be triaged. No change in condition. Rutherford Nail, NP in area.

## 2022-02-23 NOTE — ED Triage Notes (Signed)
Pt brought by [facility], per first nurse note, family called EMS because pt is altered from baseline, incontinent and weak compared to normal. Unknown if pt comes from home or SNF.   Pt unable to answer orientation questions including name, DOB. Pt not following commands, is grimacing as if in pain but unable to state if has pain. Will not open eyes to look at pupils.   Pt tachypneic.

## 2022-02-23 NOTE — ED Notes (Signed)
Pt noted to have small bowel movement, peri care performed by this tech and Verdis Frederickson, EDT. During procedure Pt is noted to have a stage 2 sacral wound. Sacrum foam pad applied by this tech. New brief, chux pad and male external catheter applied to Pt.

## 2022-02-23 NOTE — Assessment & Plan Note (Signed)
-   Continue Eliquis and metoprolol 

## 2022-02-23 NOTE — Assessment & Plan Note (Signed)
Rocephin and follow cultures Urine culture from 9/14 showed mixed urogenital flora 25-50,000 colonies

## 2022-02-23 NOTE — ED Notes (Signed)
EKG performed. 1 set cultures drawn and full rainbow including dark green, lactic, red tubes.

## 2022-02-23 NOTE — Assessment & Plan Note (Signed)
-   Continue home meds °

## 2022-02-23 NOTE — ED Notes (Signed)
20g IV established in right forearm by Elie Goody, RN. 2nd set of cultures drawn from this line by RN.

## 2022-02-23 NOTE — H&P (Signed)
History and Physical    Patient: Nathaniel Woods NKN:[id number] DOB: 10/18/1928 DOA: 02/23/2022 DOS: the patient was seen and examined on 02/23/2022 PCP: Derinda Late, MD  Patient coming from: Home  Chief Complaint:  Chief Complaint  Patient presents with   Altered Mental Status    HPI: Nathaniel Woods is a 88 y.o. male with medical history significant for CAD s/p CABG, PAF on Eliquis, FXT0W, systolic CHF s/p AICD, AAA s/p endovascular repair, DM, HTN, prior stroke, BPH who was brought to the ED with a several day history of confusion and decreased oral intake.  Urinalysis ordered by his PCP on 9/14 was consistent with UTI and he was started on doxycycline.  Urine culture showed mixed urogenital flora 25-50,000 colonies.  Due to progressive symptoms he was brought to the ED for evaluation. ED course and data review: Afebrile with pulse 84 mild tachypnea of 22 with BP 128/50 and O2 sat 96% on room air.  Labs significant for WBC of 11,500.  Hemoglobin 11.3, down from 13.1 on 7/10.  Creatinine 1.67 up from baseline of 1.2 on 7/10.  Bicarb 21.  Lipase 55 LFTs normal.  VBG with pH 7.45 and PCO2 33.  Lactic acid 1.1.  Urinalysis pending.  EKG, personally viewed and interpreted showing paced rhythm.  Chest x-ray nonacute, CT head nonacute, CT abdomen and pelvis showing the following: IMPRESSION: 1. Size of an infrarenal abdominal aortic aneurysm since 01/26/2015 without CT evidence of impending rupture. The bifurcated endograft appears unchanged in position. 2. Multiple chronic compression fractures in the thoracic and lumbar spine. 3. No acute process in the abdomen or pelvis.    Patient was given a 2 L LR bolus and started on ceftriaxone for suspicion of UTI.  Hospitalist consulted for admission.       Past Medical History:  Diagnosis Date   AAA (abdominal aortic aneurysm) without rupture (HCC)    AICD (automatic cardioverter/defibrillator) present 12/17/2016   Angina    Anxiety     "just before major surgery"   Arthritis    Asthma    childhood   Basal cell carcinoma 10/07/2021   left superior ear, schedule surgery   BPH associated with nocturia    Chronic systolic CHF (congestive heart failure) ([place])    Coronary artery disease    Hemorrhagic stroke ([place]) 09/07/2008   S/P fall   HOH (hard of hearing)    Hyperlipidemia    Hypertension    Inferior MI ([place]) 10/08/2011   Kidney stone    PAF (paroxysmal atrial fibrillation) ([place])    Sinus bradycardia    Stroke ([place])    Type II diabetes mellitus ([place])    Improved with weight loss   Wears hearing aid in both ears    Past Surgical History:  Procedure Laterality Date   ABDOMINAL AORTIC ANEURYSM REPAIR  2013   APPENDECTOMY  06/09/1945   CARDIAC CATHETERIZATION  10/08/2011   CATARACT EXTRACTION W/PHACO Right 11/05/2021   Procedure: CATARACT EXTRACTION PHACO AND INTRAOCULAR LENS PLACEMENT ([place]) RIGHT;  Surgeon: Birder Robson, MD;  Location: [facility];  Service: Ophthalmology;  Laterality: Right;  6.83 0:45.5   CATARACT EXTRACTION W/PHACO Left 11/19/2021   Procedure: CATARACT EXTRACTION PHACO AND INTRAOCULAR LENS PLACEMENT ([facility]) LEFT 11.02 01:02.8;  Surgeon: Birder Robson, MD;  Location: [facility];  Service: Ophthalmology;  Laterality: Left;   CORONARY ANGIOPLASTY WITH STENT PLACEMENT  06/10/2007   "2; made total of 6"   CORONARY ARTERY BYPASS GRAFT  10/24/2011  Procedure: CORONARY ARTERY BYPASS GRAFTING (CABG);  Surgeon: Ivin Poot, MD;  Location: [facility];  Service: Open Heart Surgery;  Laterality: N/A;  Coronary Artery Bypass Graft times four using the left internal mammary artery and the right and left greater saphenous veins harvested endoscopically.  transesophageal Echocardiogram   ICD IMPLANT  12/17/2016   INTERTROCHANTERIC HIP FRACTURE SURGERY  05/10/1991   "pin, 2 screws, plate"; right   LITHOTRIPSY  ~ 04/2011   TONSILLECTOMY  06/09/1942   VASECTOMY  ~ 1970   Social History:   reports that he quit smoking about 51 years ago. His smoking use included cigarettes. He has a 24.00 pack-year smoking history. He has never used smokeless tobacco. He reports that he does not currently use alcohol after a past usage of about 28.0 standard drinks of alcohol per week. He reports that he does not use drugs.  Allergies  Allergen Reactions   Levofloxacin Rash   Ambien [Zolpidem Tartrate] Other (See Comments)    Hallucinations and per pt he walked around at night and doesn't remember.   Aspirin Other (See Comments)    Hemorrhagic Stroke   Bee Venom Other (See Comments)    Reaction: fever   Chlorpheniramine Other (See Comments)    Reaction: unknown   Decongestant [Pseudoephedrine Hcl] Other (See Comments)    Reaction: unknown   Simvastatin Other (See Comments)    Other reaction(s): Muscle Pain   Zolpidem Other (See Comments)    Other reaction(s): Unknown "went bananas"   Contrast Media [Iodinated Contrast Media] Rash    Family History  Problem Relation Age of Onset   Breast cancer Sister     Prior to Admission medications   Medication Sig Start Date End Date Taking? Authorizing Provider  acetaminophen (TYLENOL) 325 MG tablet Take 2 tablets (650 mg total) by mouth every 6 (six) hours as needed for mild pain (or Fever >/= 101). 03/30/19   Nicholes Mango, MD  Cholecalciferol (VITAMIN D3) 50 MCG (2000 UT) TABS Take by mouth daily.    [provider]  citalopram (CELEXA) 20 MG tablet Take 20 mg by mouth daily.    [provider]  Coenzyme Q10 (COQ10 PO) Take 1 Dose by mouth daily.     [provider]  ELIQUIS 5 MG TABS tablet Take 5 mg by mouth 2 (two) times daily.  06/10/17   [provider]  finasteride (PROSCAR) 5 MG tablet Take 5 mg by mouth daily.    [provider]  fluticasone (FLONASE) 50 MCG/ACT nasal spray Place 2 sprays into both nostrils daily.  04/21/16   [provider]  furosemide (LASIX) 20 MG tablet Take 20  mg by mouth daily.  08/03/17   [provider]  latanoprost (XALATAN) 0.005 % ophthalmic solution Place 1 drop into both eyes at bedtime.  07/21/17   [provider]  losartan (COZAAR) 50 MG tablet Take 50 mg by mouth daily.    [provider]  metFORMIN (GLUCOPHAGE) 500 MG tablet Take 500 mg by mouth daily with breakfast.    [provider]  metoprolol succinate (TOPROL-XL) 50 MG 24 hr tablet Take 50 mg by mouth daily.  06/10/17   [provider]  mirtazapine (REMERON) 15 MG tablet Take 15 mg by mouth at bedtime.    [provider]  montelukast (SINGULAIR) 10 MG tablet Take 10 mg by mouth at bedtime.  06/19/17   [provider]  Multiple Vitamin (MULITIVITAMIN WITH MINERALS) TABS Take 1 tablet  by mouth daily.    [provider]  mupirocin ointment (BACTROBAN) 2 % Apply 1 application. topically daily. With dressing changes 10/15/21   Ralene Bathe, MD  Omega-3 Fatty Acids (FISH OIL) 1200 MG CAPS Take 1 capsule by mouth daily.     [provider]  Rosuvastatin Calcium (CRESTOR PO) Take 5 mg by mouth every Monday, Wednesday, and Friday.     [provider]  spironolactone (ALDACTONE) 25 MG tablet Take 12.5 mg by mouth daily.    [provider]  tacrolimus (PROTOPIC) 0.1 % ointment Apply topically daily. Apply to pressure wound on buttocks QD after cleaning area with wound irrigation spray and patting dry. 10/21/21   Ralene Bathe, MD    Physical Exam: Vitals:   02/23/22 1824 02/23/22 1832 02/23/22 1838 02/23/22 2151  BP: (!) 128/50   (!) 113/54  Pulse: 84   66  Resp: (!) 22  (!) 33 16  Temp: 97.9 F (36.6 C)   98.2 F (36.8 C)  TempSrc: Oral   Oral  SpO2: 96%   99%  Weight:  77.1 kg    Height:  6' (1.829 m)     Physical Exam Vitals and nursing note reviewed.  Constitutional:      General: He is not in acute distress. HENT:     Head: Normocephalic and atraumatic.  Cardiovascular:      Rate and Rhythm: Normal rate and regular rhythm.     Heart sounds: Normal heart sounds.  Pulmonary:     Effort: Tachypnea present.     Breath sounds: Normal breath sounds.  Abdominal:     Palpations: Abdomen is soft.     Tenderness: There is no abdominal tenderness.  Neurological:     General: No focal deficit present.     Labs on Admission: I have personally reviewed following labs and imaging studies  CBC: Recent Labs  Lab 02/23/22 1835  WBC 11.5*  NEUTROABS 8.4*  HGB 11.3*  HCT 34.0*  MCV 97.4  PLT 673   Basic Metabolic Panel: Recent Labs  Lab 02/23/22 1835  NA 139  K 4.2  CL 110  CO2 21*  GLUCOSE 100*  BUN 45*  CREATININE 1.67*  CALCIUM 9.2   GFR: Estimated Creatinine Clearance: 31.4 mL/min (A) (by C-G formula based on SCr of 1.67 mg/dL (H)). Liver Function Tests: Recent Labs  Lab 02/23/22 1835  AST 17  ALT 11  ALKPHOS 60  BILITOT 0.9  PROT 7.0  ALBUMIN 3.8   Recent Labs  Lab 02/23/22 1841  LIPASE 55*   No results for input(s): "AMMONIA" in the last 168 hours. Coagulation Profile: No results for input(s): "INR", "PROTIME" in the last 168 hours. Cardiac Enzymes: No results for input(s): "CKTOTAL", "CKMB", "CKMBINDEX", "TROPONINI" in the last 168 hours. BNP (last 3 results) No results for input(s): "PROBNP" in the last 8760 hours. HbA1C: No results for input(s): "HGBA1C" in the last 72 hours. CBG: No results for input(s): "GLUCAP" in the last 168 hours. Lipid Profile: No results for input(s): "CHOL", "HDL", "LDLCALC", "TRIG", "CHOLHDL", "LDLDIRECT" in the last 72 hours. Thyroid Function Tests: No results for input(s): "TSH", "T4TOTAL", "FREET4", "T3FREE", "THYROIDAB" in the last 72 hours. Anemia Panel: No results for input(s): "VITAMINB12", "FOLATE", "FERRITIN", "TIBC", "IRON", "RETICCTPCT" in the last 72 hours. Urine analysis:    Component Value Date/Time   COLORURINE YELLOW 10/28/2011 [place] 10/28/2011 0947    LABSPEC 1.010 10/28/2011 0947   PHURINE 5.5 10/28/2011  [place] 10/28/2011 [place] 10/28/2011 0947   BILIRUBINUR NEGATIVE 10/28/2011 0947   KETONESUR 15 (A) 10/28/2011 0947   PROTEINUR NEGATIVE 10/28/2011 0947   UROBILINOGEN 0.2 10/28/2011 0947   NITRITE NEGATIVE 10/28/2011 0947   LEUKOCYTESUR NEGATIVE 10/28/2011 0947    Radiological Exams on Admission: CT Abdomen Pelvis Wo Contrast  Result Date: 02/23/2022 CLINICAL DATA:  Abdominal pain, weakness, incontinence. EXAM: CT ABDOMEN AND PELVIS WITHOUT CONTRAST TECHNIQUE: Multidetector CT imaging of the abdomen and pelvis was performed following the standard protocol without IV contrast. RADIATION DOSE REDUCTION: This exam was performed according to the departmental dose-optimization program which includes automated exposure control, adjustment of the mA and/or kV according to patient size and/or use of iterative reconstruction technique. COMPARISON:  CT abdomen pelvis dated 01/26/2015. FINDINGS: Lower chest: Mild bibasilar atelectasis/fibrosis is redemonstrated. Hepatobiliary: No focal liver abnormality is seen. No gallstones, gallbladder wall thickening, or biliary dilatation. Pancreas: Unremarkable. No pancreatic ductal dilatation or surrounding inflammatory changes. Spleen: Normal in size without focal abnormality. Adrenals/Urinary Tract: Adrenal glands are unremarkable. Bilateral benign renal cysts measure up to 2.4 cm on the right and 6.6 cm on the left. These are not significantly changed since prior exam and no further imaging follow-up is recommended. No renal calculi or hydronephrosis on either side. Bladder is unremarkable. Stomach/Bowel: Stomach is within normal limits. The appendix is surgically absent. No evidence of bowel wall thickening, distention, or inflammatory changes. Vascular/Lymphatic: A bifurcated endograft is unchanged in position with the proximal portion just distal to the origin of the renal  arteries and the distal portions in the common iliac arteries. The surrounding aneurysm sac has increased in size since 2016, now measuring 7.9 x 8.1 cm in the axial plane (series 2, image 35) and 10.4 cm in length (series 5, image 34). This measured 5.3 x 5.7 cm in the axial plane and 8.0 cm in length on 01/26/2015. There is no surrounding fat stranding or draping of the aneurysm sac to suggest impending rupture. Atherosclerotic calcifications are seen in the abdominal aorta proximal to the renal arteries. No lymphadenopathy is seen in the abdomen or pelvis. Reproductive: Prostate is unremarkable. Other: No abdominal wall hernia or abnormality. No abdominopelvic ascites. Musculoskeletal: Multiple chronic compression fractures are seen in the spine T10, T12, L1, L2, L3, L4, L5. These have progressed since 01/26/2015. No evidence of acute fracture. The patient is status post fixation of the right femoral neck. The hardware appears intact. Degenerative changes are seen in the spine. IMPRESSION: 1. Size of an infrarenal abdominal aortic aneurysm since 01/26/2015 without CT evidence of impending rupture. The bifurcated endograft appears unchanged in position. 2. Multiple chronic compression fractures in the thoracic and lumbar spine. 3. No acute process in the abdomen or pelvis. Aortic Atherosclerosis (ICD10-I70.0). Electronically Signed   By: Zerita Boers M.D.   On: 02/23/2022 20:08   CT Head Wo Contrast  Result Date: 02/23/2022 CLINICAL DATA:  Mental status change EXAM: CT HEAD WITHOUT CONTRAST TECHNIQUE: Contiguous axial images were obtained from the base of the skull through the vertex without intravenous contrast. RADIATION DOSE REDUCTION: This exam was performed according to the departmental dose-optimization program which includes automated exposure control, adjustment of the mA and/or kV according to patient size and/or use of iterative reconstruction technique. COMPARISON:  CT head 09/22/2008 FINDINGS:  Brain: Progression of generalized atrophy which is now moderate. This is most prominent the temporal lobe. Negative for hydrocephalus. Negative for acute infarct, hemorrhage, mass Vascular: Negative  for hyperdense vessel Skull: Negative Sinuses/Orbits: Paranasal sinuses clear. Bilateral cataract extraction Other: None IMPRESSION: No acute abnormality. Progression of atrophy which is now moderate in degree. Electronically Signed   By: Franchot Gallo M.D.   On: 02/23/2022 19:59   DG Chest Port 1 View  Result Date: 02/23/2022 CLINICAL DATA:  Altered level of consciousness, incontinence, weakness EXAM: PORTABLE CHEST 1 VIEW COMPARISON:  03/28/2019 FINDINGS: Single frontal view of the chest demonstrates postsurgical changes from CABG. Multi lead pacer/AICD again identified. Cardiac silhouette is unremarkable. No airspace disease, effusion, or pneumothorax. No acute bony abnormalities. IMPRESSION: 1. No acute intrathoracic process. Electronically Signed   By: Randa Ngo M.D.   On: 02/23/2022 19:08     Data Reviewed: Relevant notes from primary care and specialist visits, past discharge summaries as available in EHR, including Care Everywhere. Prior diagnostic testing as pertinent to current admission diagnoses Updated medications and problem lists for reconciliation ED course, including vitals, labs, imaging, treatment and response to treatment Triage notes, nursing and pharmacy notes and ED provider's notes Notable results as noted in HPI   Assessment and Plan: * Acute metabolic encephalopathy Secondary to acute infection CT head with no acute findings Fall and aspiration precautions We will keep n.p.o. tonight Delirium precautions --Get up during the day --Keep blinds open and lights on during daylight hours --Minimize the use of opioids/benzodiazepines --Reorient the patient frequently, provide easily visible clock and calendar --Provide sensory aids like glasses, hearing  aids --Encourage ambulation, regular activities and visitors to maintain cognitive stimulation  --Patient would benefit from having family members at bedside to reinforce his orientation.   Urinary tract infection Rocephin and follow cultures Urine culture from 9/14 showed mixed urogenital flora 25-50,000 colonies  Acute renal failure superimposed on stage 3a chronic kidney disease (HCC) Creatinine 1.67 up from baseline of 1.2 Likely related to decreased oral intake, dehydration, mild ATN Hydrate and monitor.  Avoid nephrotoxins  PAF (paroxysmal atrial fibrillation) (HCC) Continue Eliquis and metoprolol  Chronic systolic CHF (congestive heart failure) (HCC) S/p AICD Clinically euvolemic but monitor for exacerbation given IV hydration We will continue losartan, metoprolol and furosemide and spironolactone Daily weights  S/P CABG x 4 No complaints of chest pain Continue metoprolol, statin and Eliquis  Abdominal aortic aneurysm (HCC) CT abdomen and pelvis with no acute findings  HTN (hypertension) Continue home meds  Type II diabetes mellitus (HCC) Sliding scale insulin coverage.  Hold metformin        DVT prophylaxis: Eliquis  Consults: none  Advance Care Planning:   Code Status: Prior   Family Communication: none  Disposition Plan: Back to previous home environment  Severity of Illness: The appropriate patient status for this patient is INPATIENT. Inpatient status is judged to be reasonable and necessary in order to provide the required intensity of service to ensure the patient's safety. The patient's presenting symptoms, physical exam findings, and initial radiographic and laboratory data in the context of their chronic comorbidities is felt to place them at high risk for further clinical deterioration. Furthermore, it is not anticipated that the patient will be medically stable for discharge from the hospital within 2 midnights of admission.   * I certify that  at the point of admission it is my clinical judgment that the patient will require inpatient hospital care spanning beyond 2 midnights from the point of admission due to high intensity of service, high risk for further deterioration and high frequency of surveillance required.*  Author: Athena Masse, MD  02/23/2022 10:40 PM  For on call review [email or web address].

## 2022-02-24 DIAGNOSIS — G9341 Metabolic encephalopathy: Secondary | ICD-10-CM | POA: Diagnosis not present

## 2022-02-24 LAB — CBC
HCT: 30.9 % — ABNORMAL LOW (ref 39.0–52.0)
Hemoglobin: 10.2 g/dL — ABNORMAL LOW (ref 13.0–17.0)
MCH: 32.9 pg (ref 26.0–34.0)
MCHC: 33 g/dL (ref 30.0–36.0)
MCV: 99.7 fL (ref 80.0–100.0)
Platelets: 145 10*3/uL — ABNORMAL LOW (ref 150–400)
RBC: 3.1 MIL/uL — ABNORMAL LOW (ref 4.22–5.81)
RDW: 14 % (ref 11.5–15.5)
WBC: 8.5 10*3/uL (ref 4.0–10.5)
nRBC: 0 % (ref 0.0–0.2)

## 2022-02-24 LAB — AMMONIA: Ammonia: <10 umol/L (ref 9–35)

## 2022-02-24 LAB — URINALYSIS, ROUTINE W REFLEX MICROSCOPIC
Bilirubin Urine: NEGATIVE
Glucose, UA: NEGATIVE mg/dL
Ketones, ur: NEGATIVE mg/dL
Nitrite: NEGATIVE
Protein, ur: NEGATIVE mg/dL
Specific Gravity, Urine: 1.017 (ref 1.005–1.030)
pH: 5 (ref 5.0–8.0)

## 2022-02-24 LAB — TSH: TSH: 4.504 u[IU]/mL — ABNORMAL HIGH (ref 0.350–4.500)

## 2022-02-24 LAB — C DIFFICILE QUICK SCREEN W PCR REFLEX
C Diff antigen: NEGATIVE
C Diff interpretation: NOT DETECTED
C Diff toxin: NEGATIVE

## 2022-02-24 LAB — MAGNESIUM: Magnesium: 2 mg/dL (ref 1.7–2.4)

## 2022-02-24 LAB — BASIC METABOLIC PANEL
Anion gap: 6 (ref 5–15)
BUN: 41 mg/dL — ABNORMAL HIGH (ref 8–23)
CO2: 22 mmol/L (ref 22–32)
Calcium: 8.6 mg/dL — ABNORMAL LOW (ref 8.9–10.3)
Chloride: 114 mmol/L — ABNORMAL HIGH (ref 98–111)
Creatinine, Ser: 1.59 mg/dL — ABNORMAL HIGH (ref 0.61–1.24)
GFR, Estimated: 41 mL/min — ABNORMAL LOW (ref >60)
Glucose, Bld: 89 mg/dL (ref 70–99)
Potassium: 3.4 mmol/L — ABNORMAL LOW (ref 3.5–5.1)
Sodium: 142 mmol/L (ref 135–145)

## 2022-02-24 LAB — HEMOGLOBIN A1C
Hgb A1c MFr Bld: 5.9 % — ABNORMAL HIGH (ref 4.8–5.6)
Mean Plasma Glucose: 122.63 mg/dL

## 2022-02-24 LAB — GLUCOSE, CAPILLARY
Glucose-Capillary: 104 mg/dL — ABNORMAL HIGH (ref 70–99)
Glucose-Capillary: 112 mg/dL — ABNORMAL HIGH (ref 70–99)
Glucose-Capillary: 163 mg/dL — ABNORMAL HIGH (ref 70–99)
Glucose-Capillary: 83 mg/dL (ref 70–99)
Glucose-Capillary: 84 mg/dL (ref 70–99)
Glucose-Capillary: 94 mg/dL (ref 70–99)

## 2022-02-24 LAB — T4, FREE: Free T4: 0.95 ng/dL (ref 0.61–1.12)

## 2022-02-24 LAB — VITAMIN B12: Vitamin B-12: 424 pg/mL (ref 180–914)

## 2022-02-24 MED ORDER — ZINC OXIDE 40 % EX OINT
TOPICAL_OINTMENT | Freq: Every day | CUTANEOUS | Status: DC
  Filled 2022-02-24: qty 113

## 2022-02-24 MED ORDER — IPRATROPIUM-ALBUTEROL 0.5-2.5 (3) MG/3ML IN SOLN: 3.0000 mL | RESPIRATORY_TRACT | Status: DC | PRN

## 2022-02-24 MED ORDER — POTASSIUM CHLORIDE CRYS ER 20 MEQ PO TBCR
40.0000 meq | EXTENDED_RELEASE_TABLET | Freq: Once | ORAL | Status: AC
  Administered 2022-02-24: 40 meq via ORAL
  Filled 2022-02-24: qty 2

## 2022-02-24 MED ORDER — METOPROLOL TARTRATE 5 MG/5ML IV SOLN: 5.0000 mg | INTRAVENOUS | Status: DC | PRN

## 2022-02-24 MED ORDER — HYDRALAZINE HCL 20 MG/ML IJ SOLN: 10.0000 mg | INTRAMUSCULAR | Status: DC | PRN

## 2022-02-24 MED ORDER — SODIUM CHLORIDE 0.9 % IV SOLN: INTRAVENOUS | Status: DC

## 2022-02-24 MED ORDER — APIXABAN 2.5 MG PO TABS
2.5000 mg | ORAL_TABLET | Freq: Two times a day (BID) | ORAL | Status: DC
  Administered 2022-02-24 – 2022-02-25 (×2): 2.5 mg via ORAL
  Filled 2022-02-24 (×2): qty 1

## 2022-02-24 MED ORDER — GUAIFENESIN 100 MG/5ML PO LIQD: 5.0000 mL | ORAL | Status: DC | PRN

## 2022-02-24 MED ORDER — SENNOSIDES-DOCUSATE SODIUM 8.6-50 MG PO TABS: 1.0000 | ORAL_TABLET | Freq: Every evening | ORAL | Status: DC | PRN

## 2022-02-24 MED ORDER — SODIUM CHLORIDE 0.9 % IV SOLN
1.0000 g | INTRAVENOUS | Status: DC
  Administered 2022-02-24 – 2022-02-25 (×2): 1 g via INTRAVENOUS
  Filled 2022-02-24 (×2): qty 1

## 2022-02-24 NOTE — Plan of Care (Signed)

## 2022-02-24 NOTE — Progress Notes (Signed)
PROGRESS NOTE    Nathaniel Woods  SWN:[id number] DOB: 31-Aug-1928 DOA: 02/23/2022 PCP: Derinda Late, MD   Brief Narrative:  88 y.o. male with medical history significant for CAD s/p CABG, PAF on Eliquis, XFG1W, systolic CHF s/p AICD, AAA s/p endovascular repair, DM, HTN, prior stroke, BPH who was brought to the ED with a several day history of confusion and decreased oral intake.  Urinalysis ordered by his PCP on 9/14 was consistent with UTI and he was started on doxycycline.  Urine culture showed mixed urogenital flora 25-50,000 colonies.    Assessment & Plan:  Principal Problem:   Acute metabolic encephalopathy Active Problems:   Urinary tract infection   Acute renal failure superimposed on stage 3a chronic kidney disease (HCC)   Type II diabetes mellitus (HCC)   HTN (hypertension)   Abdominal aortic aneurysm (HCC)   S/P CABG x 4   Chronic systolic CHF (congestive heart failure) (HCC)   AICD (automatic cardioverter/defibrillator) present   PAF (paroxysmal atrial fibrillation) (HCC)     Assessment and Plan: * Acute metabolic encephalopathy Secondary to acute infection & Dehydration.  Ammonia normal. TSH elevated check Ft4. B12 and Folate.  On IVF, And IV Abx.  PT/OT CT head with no acute findings  Urinary tract infection Rocephin x 3 more doses. and follow cultures Urine culture from 9/14 showed mixed urogenital flora 25-50,000 colonies  Acute renal failure superimposed on stage 3a chronic kidney disease (HCC) Creatinine 1.67 up from baseline of 1.2 IVF, hold nephrotoxic drugs. IVF.   PAF (paroxysmal atrial fibrillation) (HCC) Continue Eliquis and metoprolol  Chronic systolic CHF (congestive heart failure) (HCC) S/p AICD Hold losartan, lasix and aldactone. Toprol XL  S/P CABG x 4 No complaints of chest pain Continue metoprolol, statin and Eliquis  Abdominal aortic aneurysm (HCC) CT abdomen and pelvis with no acute findings. Chronic compression thoracic and  lumbar compression fx.   HTN (hypertension) Continue home meds  Type II diabetes mellitus (HCC) Sliding scale insulin coverage.  Hold metformin     DVT prophylaxis: Eliquis Code Status:  Family Communication:  Spoke with his son.   Status is: Inpatient On going management with IVF, weakness and IV Abx. Will need PT/OT    Subjective: Doing ok, overall weak. Confused about place.  Lives at home, mostly stays in his recliner.  Ambulated with walker.   Examination:  General exam: Appears calm and comfortable.   Respiratory system: Clear to auscultation. Respiratory effort normal. Cardiovascular system: S1 & S2 heard, RRR. No JVD, murmurs, rubs, gallops or clicks. No pedal edema. Gastrointestinal system: Abdomen is nondistended, soft and nontender. No organomegaly or masses felt. Normal bowel sounds heard. Central nervous system: Alert and oriented. No focal neurological deficits. Extremities: Symmetric 4 x 5 power. Skin: No rashes, lesions or ulcers Psychiatry: Judgement and insight appear Poor. Alert to name and place.     Objective: Vitals:   02/23/22 1838 02/23/22 2151 02/24/22 0001 02/24/22 0031  BP:  (!) 113/54 131/61   Pulse:  66 64   Resp: (!) 33 16 20   Temp:  98.2 F (36.8 C) 98 F (36.7 C)   TempSrc:  Oral    SpO2:  99% 95%   Weight:    77.9 kg  Height:    '6\' 3"'$  (1.905 m)    Intake/Output Summary (Last 24 hours) at 02/24/2022 1112 Last data filed at 02/23/2022 2314 Gross per 24 hour  Intake 1100 ml  Output --  Net 1100 ml  Filed Weights   02/23/22 1832 02/24/22 0031  Weight: 77.1 kg 77.9 kg     Data Reviewed:   CBC: Recent Labs  Lab 02/23/22 1835 02/24/22 0715  WBC 11.5* 8.5  NEUTROABS 8.4*  --   HGB 11.3* 10.2*  HCT 34.0* 30.9*  MCV 97.4 99.7  PLT 163 810*   Basic Metabolic Panel: Recent Labs  Lab 02/23/22 1835 02/24/22 0553 02/24/22 0836  NA 139 142  --   K 4.2 3.4*  --   CL 110 114*  --   CO2 21* 22  --   GLUCOSE 100*  89  --   BUN 45* 41*  --   CREATININE 1.67* 1.59*  --   CALCIUM 9.2 8.6*  --   MG  --   --  2.0   GFR: Estimated Creatinine Clearance: 33.3 mL/min (A) (by C-G formula based on SCr of 1.59 mg/dL (H)). Liver Function Tests: Recent Labs  Lab 02/23/22 1835  AST 17  ALT 11  ALKPHOS 60  BILITOT 0.9  PROT 7.0  ALBUMIN 3.8   Recent Labs  Lab 02/23/22 1841  LIPASE 55*   Recent Labs  Lab 02/24/22 0833  AMMONIA <10   Coagulation Profile: No results for input(s): "INR", "PROTIME" in the last 168 hours. Cardiac Enzymes: No results for input(s): "CKTOTAL", "CKMB", "CKMBINDEX", "TROPONINI" in the last 168 hours. BNP (last 3 results) No results for input(s): "PROBNP" in the last 8760 hours. HbA1C: Recent Labs    02/23/22 1841  HGBA1C 5.9*   CBG: Recent Labs  Lab 02/24/22 0012 02/24/22 0436 02/24/22 0754  GLUCAP 104* 94 83   Lipid Profile: No results for input(s): "CHOL", "HDL", "LDLCALC", "TRIG", "CHOLHDL", "LDLDIRECT" in the last 72 hours. Thyroid Function Tests: Recent Labs    02/24/22 0836  TSH 4.504*   Anemia Panel: No results for input(s): "VITAMINB12", "FOLATE", "FERRITIN", "TIBC", "IRON", "RETICCTPCT" in the last 72 hours. Sepsis Labs: Recent Labs  Lab 02/23/22 1835 02/23/22 1841 02/23/22 2212  PROCALCITON  --  <0.10  --   LATICACIDVEN 1.5  --  1.1    Recent Results (from the past 240 hour(s))  SARS Coronavirus 2 by RT PCR (hospital order, performed in [facility] hospital lab) *cepheid single result test* Anterior Nasal Swab     Status: None   Collection Time: 02/23/22 10:12 PM   Specimen: Anterior Nasal Swab  Result Value Ref Range Status   SARS Coronavirus 2 by RT PCR NEGATIVE NEGATIVE Final    Comment: (NOTE) SARS-CoV-2 target nucleic acids are NOT DETECTED.  The SARS-CoV-2 RNA is generally detectable in upper and lower respiratory specimens during the acute phase of infection. The lowest concentration of SARS-CoV-2 viral copies this assay  can detect is 250 copies / mL. A negative result does not preclude SARS-CoV-2 infection and should not be used as the sole basis for treatment or other patient management decisions.  A negative result may occur with improper specimen collection / handling, submission of specimen other than nasopharyngeal swab, presence of viral mutation(s) within the areas targeted by this assay, and inadequate number of viral copies (<250 copies / mL). A negative result must be combined with clinical observations, patient history, and epidemiological information.  Fact Sheet for Patients:   https://www.patel.info/  Fact Sheet for Healthcare Providers: https://hall.com/  This test is not yet approved or  cleared by the [place] FDA and has been authorized for detection and/or diagnosis of SARS-CoV-2 by FDA under an Emergency Use Authorization (EUA).  This EUA will remain in effect (meaning this test can be used) for the duration of the COVID-19 declaration under Section 564(b)(1) of the Act, 21 U.S.C. section 360bbb-3(b)(1), unless the authorization is terminated or revoked sooner.  Performed at [facility], [address] [place], [place] [address]          Radiology Studies: CT Abdomen Pelvis Wo Contrast  Result Date: 02/23/2022 CLINICAL DATA:  Abdominal pain, weakness, incontinence. EXAM: CT ABDOMEN AND PELVIS WITHOUT CONTRAST TECHNIQUE: Multidetector CT imaging of the abdomen and pelvis was performed following the standard protocol without IV contrast. RADIATION DOSE REDUCTION: This exam was performed according to the departmental dose-optimization program which includes automated exposure control, adjustment of the mA and/or kV according to patient size and/or use of iterative reconstruction technique. COMPARISON:  CT abdomen pelvis dated 01/26/2015. FINDINGS: Lower chest: Mild bibasilar atelectasis/fibrosis is redemonstrated.  Hepatobiliary: No focal liver abnormality is seen. No gallstones, gallbladder wall thickening, or biliary dilatation. Pancreas: Unremarkable. No pancreatic ductal dilatation or surrounding inflammatory changes. Spleen: Normal in size without focal abnormality. Adrenals/Urinary Tract: Adrenal glands are unremarkable. Bilateral benign renal cysts measure up to 2.4 cm on the right and 6.6 cm on the left. These are not significantly changed since prior exam and no further imaging follow-up is recommended. No renal calculi or hydronephrosis on either side. Bladder is unremarkable. Stomach/Bowel: Stomach is within normal limits. The appendix is surgically absent. No evidence of bowel wall thickening, distention, or inflammatory changes. Vascular/Lymphatic: A bifurcated endograft is unchanged in position with the proximal portion just distal to the origin of the renal arteries and the distal portions in the common iliac arteries. The surrounding aneurysm sac has increased in size since 2016, now measuring 7.9 x 8.1 cm in the axial plane (series 2, image 35) and 10.4 cm in length (series 5, image 34). This measured 5.3 x 5.7 cm in the axial plane and 8.0 cm in length on 01/26/2015. There is no surrounding fat stranding or draping of the aneurysm sac to suggest impending rupture. Atherosclerotic calcifications are seen in the abdominal aorta proximal to the renal arteries. No lymphadenopathy is seen in the abdomen or pelvis. Reproductive: Prostate is unremarkable. Other: No abdominal wall hernia or abnormality. No abdominopelvic ascites. Musculoskeletal: Multiple chronic compression fractures are seen in the spine T10, T12, L1, L2, L3, L4, L5. These have progressed since 01/26/2015. No evidence of acute fracture. The patient is status post fixation of the right femoral neck. The hardware appears intact. Degenerative changes are seen in the spine. IMPRESSION: 1. Size of an infrarenal abdominal aortic aneurysm since 01/26/2015  without CT evidence of impending rupture. The bifurcated endograft appears unchanged in position. 2. Multiple chronic compression fractures in the thoracic and lumbar spine. 3. No acute process in the abdomen or pelvis. Aortic Atherosclerosis (ICD10-I70.0). Electronically Signed   By: Zerita Boers M.D.   On: 02/23/2022 20:08   CT Head Wo Contrast  Result Date: 02/23/2022 CLINICAL DATA:  Mental status change EXAM: CT HEAD WITHOUT CONTRAST TECHNIQUE: Contiguous axial images were obtained from the base of the skull through the vertex without intravenous contrast. RADIATION DOSE REDUCTION: This exam was performed according to the departmental dose-optimization program which includes automated exposure control, adjustment of the mA and/or kV according to patient size and/or use of iterative reconstruction technique. COMPARISON:  CT head 09/22/2008 FINDINGS: Brain: Progression of generalized atrophy which is now moderate. This is most prominent the temporal lobe. Negative for hydrocephalus. Negative for acute infarct,  hemorrhage, mass Vascular: Negative for hyperdense vessel Skull: Negative Sinuses/Orbits: Paranasal sinuses clear. Bilateral cataract extraction Other: None IMPRESSION: No acute abnormality. Progression of atrophy which is now moderate in degree. Electronically Signed   By: Franchot Gallo M.D.   On: 02/23/2022 19:59   DG Chest Port 1 View  Result Date: 02/23/2022 CLINICAL DATA:  Altered level of consciousness, incontinence, weakness EXAM: PORTABLE CHEST 1 VIEW COMPARISON:  03/28/2019 FINDINGS: Single frontal view of the chest demonstrates postsurgical changes from CABG. Multi lead pacer/AICD again identified. Cardiac silhouette is unremarkable. No airspace disease, effusion, or pneumothorax. No acute bony abnormalities. IMPRESSION: 1. No acute intrathoracic process. Electronically Signed   By: Randa Ngo M.D.   On: 02/23/2022 19:08        Scheduled Meds:  apixaban  5 mg Oral BID    citalopram  20 mg Oral Daily   insulin aspart  0-9 Units Subcutaneous Q4H   metoprolol succinate  50 mg Oral Daily   mirtazapine  15 mg Oral QHS   rosuvastatin  5 mg Oral Q M,W,F   Continuous Infusions:  sodium chloride 75 mL/hr at 02/24/22 0821     LOS: 1 day   Time spent= 35 mins    Ferlin Fairhurst Arsenio Loader, MD [facility]  If 7PM-7AM, please contact night-coverage  02/24/2022, 11:12 AM

## 2022-02-24 NOTE — Evaluation (Signed)
Occupational Therapy Evaluation Patient Details Name: Nathaniel Woods MRN: [id number] DOB: [birth year] Today's Date: 02/24/2022   History of Present Illness Nathaniel Woods is a 88 y.o. male with medical history significant for CAD s/p CABG, PAF on Eliquis, CXK4Y, systolic CHF s/p AICD, AAA s/p endovascular repair, DM, HTN, prior stroke, BPH who was brought to the ED with a several day history of confusion and decreased oral intake.   Clinical Impression   Patient seen for OT evaluation. Patient presenting with decreased cognition, strength, endurance, and balance impacting safety and independence in ADLs. At baseline, patient is independent with most ADLs, receives assistance for all IADLs, and is Mod I for functional mobility using a RW. Patient lives with his spouse. His son checks on him daily and has an aide that comes 4x/wk. Patient currently functioning at [facility] A for bed mobility, CGA-Min A for functional transfers with +2 for safety, Max A for LB dressing, Min A for UB dressing, and Max A for posterior hygiene in standing. Patient will benefit from acute OT to increase overall independence in the areas of ADLs and functional mobility in order to safely discharge to next venue of care. Upon hospital discharge, recommend STR to maximize pt safety and return to PLOF.      Recommendations for follow up therapy are one component of a multi-disciplinary discharge planning process, led by the attending physician.  Recommendations may be updated based on patient status, additional functional criteria and insurance authorization.   Follow Up Recommendations  Skilled nursing-short term rehab (<3 hours/day)    Assistance Recommended at Discharge Frequent or constant Supervision/Assistance  Patient can return home with the following A lot of help with walking and/or transfers;A lot of help with bathing/dressing/bathroom;Assist for transportation;Help with stairs or ramp for entrance;Direct  supervision/assist for medications management;Direct supervision/assist for financial management;Assistance with cooking/housework    Functional Status Assessment  Patient has had a recent decline in their functional status and demonstrates the ability to make significant improvements in function in a reasonable and predictable amount of time.  Equipment Recommendations  Other (comment) (defer to next venue of care)    Recommendations for Other Services       Precautions / Restrictions Precautions Precautions: Fall Restrictions Weight Bearing Restrictions: No      Mobility Bed Mobility Overal bed mobility: Needs Assistance Bed Mobility: Supine to Sit     Supine to sit: Min assist, HOB elevated          Transfers Overall transfer level: Needs assistance Equipment used: Rolling walker (2 wheels) Transfers: Bed to chair/wheelchair/BSC   Stand pivot transfers: Min assist, Min guard, +2 safety/equipment         General transfer comment: Min A for RW management, Min guard for steps and controlled descent onto chair      Balance Overall balance assessment: Needs assistance Sitting-balance support: Feet supported Sitting balance-Leahy Scale: Good Sitting balance - Comments: supervision static sitting EOB   Standing balance support: Bilateral upper extremity supported Standing balance-Leahy Scale: Fair                             ADL either performed or assessed with clinical judgement   ADL Overall ADL's : Needs assistance/impaired                 Upper Body Dressing : Sitting;Minimal assistance Upper Body Dressing Details (indicate cue type and reason): to don/doff gown Lower Body Dressing:  Maximal assistance;Sitting/lateral leans Lower Body Dressing Details (indicate cue type and reason): to don/doff socks Toilet Transfer: +2 for safety/equipment;Rolling walker (2 wheels);Min guard;Minimal assistance;Cueing for sequencing;Cueing for  safety Toilet Transfer Details (indicate cue type and reason): simulated with stand pivot t/f from EOB > recliner, Min A for RW management, Min guard for steps and controlled descent onto chair Toileting- Clothing Manipulation and Hygiene: Maximal assistance;Sit to/from stand Toileting - Clothing Manipulation Details (indicate cue type and reason): Max A for posterior hygiene       General ADL Comments: Bed/linens were soiled upon arrival, NT and RN present to assist with changing linens and mobility.     Vision Baseline Vision/History: 1 Wears glasses Patient Visual Report: No change from baseline       Perception     Praxis      Pertinent Vitals/Pain Pain Assessment Pain Assessment: Faces Faces Pain Scale: Hurts whole lot Pain Location: sacral wound on bottom Pain Descriptors / Indicators: Grimacing, Guarding, Discomfort Pain Intervention(s): Monitored during session, Patient requesting pain meds-RN notified     Hand Dominance     Extremity/Trunk Assessment Upper Extremity Assessment Upper Extremity Assessment: Generalized weakness   Lower Extremity Assessment Lower Extremity Assessment: Generalized weakness       Communication Communication Communication: HOH (wearing hearing aids)   Cognition Arousal/Alertness: Awake/alert Behavior During Therapy: WFL for tasks assessed/performed Overall Cognitive Status: History of cognitive impairments - at baseline                                 General Comments: Per chart review, pt has mild confusion at baseline. A&Ox4. Demonstrated decreased safety awareness/impulsivity, unaware that bed was soiled. Required repetition of one step commands 2/2 HOH, verbal/tactile cues for sequencing. Tangential with conversation requiring verbal cues for redirection.     General Comments       Exercises Other Exercises Other Exercises: OT provided education re: role of OT, OT POC, post acute recs, sitting up for all  meals, EOB/OOB mobility with assistance, home/fall safety.     Shoulder Instructions      Home Living Family/patient expects to be discharged to:: Private residence Living Arrangements: Spouse/significant other Available Help at Discharge: Family;Available PRN/intermittently Type of Home: House Home Access: Stairs to enter [employer] of Steps: 2 Entrance Stairs-Rails: None Home Layout: Two level;Able to live on main level with bedroom/bathroom     Bathroom Shower/Tub: Walk-in shower         Home Equipment: Grab bars - tub/shower;Cane - single [employer]ista (2 wheels)   Additional Comments: Pt reports home is handicap accessible. Son checks on pt daily, has aide that come 4x/wk.      Prior Functioning/Environment Prior Level of Function : Needs assist  Cognitive Assist : Mobility (cognitive);ADLs (cognitive)     Physical Assist : ADLs (physical)     Mobility Comments: Pt reports IND with RW for short household distances. History of falling but unsure if in past 6 months. ADLs Comments: Pt reports IND with dressing. Son provides transportation and picks up groceries. Requires supervision for showering, aide assists with cooking, has hired cleaners. Pt sleeps in recliner, has urinary urgency (wears briefs at baseline).        OT Problem List: Decreased strength;Decreased knowledge of use of DME or AE;Decreased activity tolerance;Impaired balance (sitting and/or standing);Decreased safety awareness;Decreased cognition;Pain      OT Treatment/Interventions: Self-care/ADL training;Therapeutic exercise;Patient/family education;Balance training;Energy conservation;Therapeutic activities;DME and/or  AE instruction;Cognitive remediation/compensation    OT Goals(Current goals can be found in the care plan section) Acute Rehab OT Goals Patient Stated Goal: return to PLOF OT Goal Formulation: With patient Time For Goal Achievement: 03/10/22 Potential to Achieve  Goals: Good ADL Goals Pt Will Perform Grooming: with supervision;standing Pt Will Perform Upper Body Dressing: with supervision;sitting Pt Will Perform Lower Body Dressing: with supervision;sitting/lateral leans;sit to/from stand Pt Will Transfer to Toilet: with supervision;ambulating;bedside commode Pt Will Perform Toileting - Clothing Manipulation and hygiene: with supervision;sit to/from stand  OT Frequency: Min 2X/week    Co-evaluation              AM-PAC OT "6 Clicks" Daily Activity     Outcome Measure Help from another person eating meals?: None Help from another person taking care of personal grooming?: A Little Help from another person toileting, which includes using toliet, bedpan, or urinal?: A Lot Help from another person bathing (including washing, rinsing, drying)?: A Lot Help from another person to put on and taking off regular upper body clothing?: A Little Help from another person to put on and taking off regular lower body clothing?: A Lot 6 Click Score: 16   End of Session Equipment Utilized During Treatment: Gait belt;Rolling walker (2 wheels) Nurse Communication: Mobility status  Activity Tolerance: Patient tolerated treatment well Patient left: in chair;with call bell/phone within reach;with chair alarm set;with nursing/sitter in room  OT Visit Diagnosis: Unsteadiness on feet (R26.81);History of falling (Z91.81);Muscle weakness (generalized) (M62.81);Other symptoms and signs involving cognitive function;Pain Pain - part of body:  (sacral wound on bottom)                Time: 1457-1540 OT Time Calculation (min): 43 min Charges:  OT General Charges $OT Visit: 1 Visit OT Evaluation $OT Eval Low Complexity: 1 Low OT Treatments $Self Care/Home Management : 8-22 mins  [facility] MS, OTR/L ascom [phone]  02/24/22, 4:40 PM

## 2022-02-25 DIAGNOSIS — G9341 Metabolic encephalopathy: Secondary | ICD-10-CM | POA: Diagnosis not present

## 2022-02-25 LAB — CBC
HCT: 31.1 % — ABNORMAL LOW (ref 39.0–52.0)
Hemoglobin: 10 g/dL — ABNORMAL LOW (ref 13.0–17.0)
MCH: 32.7 pg (ref 26.0–34.0)
MCHC: 32.2 g/dL (ref 30.0–36.0)
MCV: 101.6 fL — ABNORMAL HIGH (ref 80.0–100.0)
Platelets: 150 10*3/uL (ref 150–400)
RBC: 3.06 MIL/uL — ABNORMAL LOW (ref 4.22–5.81)
RDW: 13.8 % (ref 11.5–15.5)
WBC: 8.4 10*3/uL (ref 4.0–10.5)
nRBC: 0 % (ref 0.0–0.2)

## 2022-02-25 LAB — BASIC METABOLIC PANEL
Anion gap: 5 (ref 5–15)
BUN: 28 mg/dL — ABNORMAL HIGH (ref 8–23)
CO2: 22 mmol/L (ref 22–32)
Calcium: 8.3 mg/dL — ABNORMAL LOW (ref 8.9–10.3)
Chloride: 118 mmol/L — ABNORMAL HIGH (ref 98–111)
Creatinine, Ser: 1.26 mg/dL — ABNORMAL HIGH (ref 0.61–1.24)
GFR, Estimated: 54 mL/min — ABNORMAL LOW (ref >60)
Glucose, Bld: 90 mg/dL (ref 70–99)
Potassium: 4.1 mmol/L (ref 3.5–5.1)
Sodium: 145 mmol/L (ref 135–145)

## 2022-02-25 LAB — FOLATE: Folate: 12.6 ng/mL (ref >5.9)

## 2022-02-25 LAB — GLUCOSE, CAPILLARY
Glucose-Capillary: 103 mg/dL — ABNORMAL HIGH (ref 70–99)
Glucose-Capillary: 121 mg/dL — ABNORMAL HIGH (ref 70–99)
Glucose-Capillary: 153 mg/dL — ABNORMAL HIGH (ref 70–99)
Glucose-Capillary: 86 mg/dL (ref 70–99)
Glucose-Capillary: 91 mg/dL (ref 70–99)
Glucose-Capillary: 92 mg/dL (ref 70–99)

## 2022-02-25 MED ORDER — SODIUM CHLORIDE 0.9 % IV SOLN: INTRAVENOUS | Status: AC

## 2022-02-25 MED ORDER — OLANZAPINE 10 MG IM SOLR
5.0000 mg | Freq: Once | INTRAMUSCULAR | Status: AC
  Administered 2022-02-25: 5 mg via INTRAMUSCULAR
  Filled 2022-02-25: qty 10

## 2022-02-25 MED ORDER — OLANZAPINE 10 MG IM SOLR: 5.0000 mg | Freq: Four times a day (QID) | INTRAMUSCULAR | Status: DC | PRN

## 2022-02-25 MED ORDER — APIXABAN 5 MG PO TABS
5.0000 mg | ORAL_TABLET | Freq: Two times a day (BID) | ORAL | Status: DC
  Administered 2022-02-25 – 2022-02-27 (×4): 5 mg via ORAL
  Filled 2022-02-25 (×4): qty 1

## 2022-02-25 NOTE — Progress Notes (Signed)
PROGRESS NOTE    Nathaniel Woods  LPF:[id number] DOB: 1928/06/12 DOA: 02/23/2022 PCP: Derinda Late, MD   Brief Narrative:  88 y.o. male with medical history significant for CAD s/p CABG, PAF on Eliquis, [id number], systolic CHF s/p AICD, AAA s/p endovascular repair, DM, HTN, prior stroke, BPH who was brought to the ED with a several day history of confusion and decreased oral intake.  Urinalysis ordered by his PCP on 9/14 was consistent with UTI and he was started on doxycycline.  Urine culture showed mixed urogenital flora 25-50,000 colonies.  He was started on IV fluids and Rocephin.  Developed significant agitation.   Assessment & Plan:  Principal Problem:   Acute metabolic encephalopathy Active Problems:   Urinary tract infection   Acute renal failure superimposed on stage 3a chronic kidney disease (HCC)   Type II diabetes mellitus (HCC)   HTN (hypertension)   Abdominal aortic aneurysm (HCC)   S/P CABG x 4   Chronic systolic CHF (congestive heart failure) (HCC)   AICD (automatic cardioverter/defibrillator) present   PAF (paroxysmal atrial fibrillation) (HCC)     Assessment and Plan: * Acute metabolic encephalopathy with significant agitation and delirium. Secondary to acute infection & Dehydration.  Ammonia normal.,  B12, folate normal.  TSH elevated, free T4 normal. Continue IV fluids and IV Rocephin empirically.  OT recommended SNF but PT eval pending.  CT of the head is negative. Advised nursing staff to perform bladder scan due to significant agitation and straight catheter if necessary.  In the meantime I have ordered IM Zyprexa due to concerns of patient and staff safety.  Urinary tract infection Rocephin x 3 more doses. and follow cultures Urine culture from 9/14 showed mixed urogenital flora 25-50,000 colonies  Acute renal failure superimposed on stage 3a chronic kidney disease (HCC) Creatinine 1.67 up from baseline of 1.2.  Creatinine back to baseline today  PAF  (paroxysmal atrial fibrillation) (HCC) Continue Eliquis and metoprolol  Chronic systolic CHF (congestive heart failure) (HCC) S/p AICD Hold losartan, lasix and aldactone. Toprol XL  S/P CABG x 4 No complaints of chest pain Continue metoprolol, statin and Eliquis  Abdominal aortic aneurysm (HCC) CT abdomen and pelvis with no acute findings. Chronic compression thoracic and lumbar compression fx.   HTN (hypertension) Continue home meds  Type II diabetes mellitus (HCC) Sliding scale insulin coverage.  Hold metformin     DVT prophylaxis: Eliquis Code Status:  Family Communication: Spoke with patient's son over the phone at [facility]  Status is: Inpatient Patient is significantly agitated, not safe discharge today.  Continue hospital stay.  PT eval is still pending.   Subjective: This morning patient was calm and did not have any complaints. But later at nurses request I saw the patient again when he was significantly agitated.  During my second visit he went refused any kind of physical exam as he was almost getting physically aggressive.  He did not want to speak with me any further.  No focal neurodeficits.  Examination:  Constitutional: Not in acute distress but later was very agitated.  Respiratory: Clear to auscultation bilaterally Cardiovascular: Normal sinus rhythm, no rubs Abdomen: Nontender nondistended good bowel sounds Musculoskeletal: No edema noted Skin: No rashes seen Neurologic: CN 2-12 grossly intact.  And nonfocal Psychiatric: Poor judgment and insight. Alert and oriented x name only.   Objective: Vitals:   02/24/22 1742 02/24/22 2032 02/25/22 0428 02/25/22 0846  BP: (!) 107/55 129/60 (!) 116/59 125/80  Pulse: 64 65 67 73  Resp: '18 18 19   '$ Temp: 98.2 F (36.8 C) 98 F (36.7 C) 97.7 F (36.5 C)   TempSrc:   Oral   SpO2: 100% 96% 99%   Weight:      Height:        Intake/Output Summary (Last 24 hours) at 02/25/2022 1237 Last data filed at 02/25/2022  0840 Gross per 24 hour  Intake 795 ml  Output 400 ml  Net 395 ml   Filed Weights   02/23/22 1832 02/24/22 0031  Weight: 77.1 kg 77.9 kg     Data Reviewed:   CBC: Recent Labs  Lab 02/23/22 1835 02/24/22 0715 02/25/22 0417  WBC 11.5* 8.5 8.4  NEUTROABS 8.4*  --   --   HGB 11.3* 10.2* 10.0*  HCT 34.0* 30.9* 31.1*  MCV 97.4 99.7 101.6*  PLT 163 145* 979   Basic Metabolic Panel: Recent Labs  Lab 02/23/22 1835 02/24/22 0553 02/24/22 0836 02/25/22 0417  NA 139 142  --  145  K 4.2 3.4*  --  4.1  CL 110 114*  --  118*  CO2 21* 22  --  22  GLUCOSE 100* 89  --  90  BUN 45* 41*  --  28*  CREATININE 1.67* 1.59*  --  1.26*  CALCIUM 9.2 8.6*  --  8.3*  MG  --   --  2.0  --    GFR: Estimated Creatinine Clearance: 42.1 mL/min (A) (by C-G formula based on SCr of 1.26 mg/dL (H)). Liver Function Tests: Recent Labs  Lab 02/23/22 1835  AST 17  ALT 11  ALKPHOS 60  BILITOT 0.9  PROT 7.0  ALBUMIN 3.8   Recent Labs  Lab 02/23/22 1841  LIPASE 55*   Recent Labs  Lab 02/24/22 0833  AMMONIA <10   Coagulation Profile: No results for input(s): "INR", "PROTIME" in the last 168 hours. Cardiac Enzymes: No results for input(s): "CKTOTAL", "CKMB", "CKMBINDEX", "TROPONINI" in the last 168 hours. BNP (last 3 results) No results for input(s): "PROBNP" in the last 8760 hours. HbA1C: Recent Labs    02/23/22 1841  HGBA1C 5.9*   CBG: Recent Labs  Lab 02/24/22 1648 02/24/22 2034 02/25/22 0023 02/25/22 0425 02/25/22 0734  GLUCAP 112* 163* 103* 91 86   Lipid Profile: No results for input(s): "CHOL", "HDL", "LDLCALC", "TRIG", "CHOLHDL", "LDLDIRECT" in the last 72 hours. Thyroid Function Tests: Recent Labs    02/24/22 0823 02/24/22 0836  TSH  --  4.504*  FREET4 0.95  --    Anemia Panel: Recent Labs    02/24/22 0836 02/25/22 0417  VITAMINB12 424  --   FOLATE  --  12.6   Sepsis Labs: Recent Labs  Lab 02/23/22 1835 02/23/22 1841 02/23/22 2212  PROCALCITON   --  <0.10  --   LATICACIDVEN 1.5  --  1.1    Recent Results (from the past 240 hour(s))  Culture, blood (routine x 2)     Status: None (Preliminary result)   Collection Time: 02/23/22  6:42 PM   Specimen: BLOOD  Result Value Ref Range Status   Specimen Description BLOOD RIGHT ANTECUBITAL  Final   Special Requests   Final    BOTTLES DRAWN AEROBIC AND ANAEROBIC Blood Culture results may not be optimal due to an inadequate volume of blood received in culture bottles   Culture   Final    NO GROWTH 2 DAYS Performed at [facility], [address] [place], [place] [address]    Report Status  PENDING  Incomplete  Culture, blood (routine x 2)     Status: None (Preliminary result)   Collection Time: 02/23/22  6:59 PM   Specimen: BLOOD  Result Value Ref Range Status   Specimen Description BLOOD BLOOD RIGHT FOREARM  Final   Special Requests   Final    BOTTLES DRAWN AEROBIC AND ANAEROBIC Blood Culture adequate volume   Culture   Final    NO GROWTH 2 DAYS Performed at [facility], [address] [place], [place] [address]    Report Status PENDING  Incomplete  SARS Coronavirus 2 by RT PCR (hospital order, performed in [facility] hospital lab) *cepheid single result test* Anterior Nasal Swab     Status: None   Collection Time: 02/23/22 10:12 PM   Specimen: Anterior Nasal Swab  Result Value Ref Range Status   SARS Coronavirus 2 by RT PCR NEGATIVE NEGATIVE Final    Comment: (NOTE) SARS-CoV-2 target nucleic acids are NOT DETECTED.  The SARS-CoV-2 RNA is generally detectable in upper and lower respiratory specimens during the acute phase of infection. The lowest concentration of SARS-CoV-2 viral copies this assay can detect is 250 copies / mL. A negative result does not preclude SARS-CoV-2 infection and should not be used as the sole basis for treatment or other patient management decisions.  A negative result may occur with improper specimen collection / handling,  submission of specimen other than nasopharyngeal swab, presence of viral mutation(s) within the areas targeted by this assay, and inadequate number of viral copies (<250 copies / mL). A negative result must be combined with clinical observations, patient history, and epidemiological information.  Fact Sheet for Patients:   https://www.patel.info/  Fact Sheet for Healthcare Providers: https://hall.com/  This test is not yet approved or  cleared by the [place] FDA and has been authorized for detection and/or diagnosis of SARS-CoV-2 by FDA under an Emergency Use Authorization (EUA).  This EUA will remain in effect (meaning this test can be used) for the duration of the COVID-19 declaration under Section 564(b)(1) of the Act, 21 U.S.C. section 360bbb-3(b)(1), unless the authorization is terminated or revoked sooner.  Performed at [facility], [place], [place] [address]   C Difficile Quick Screen w PCR reflex     Status: None   Collection Time: 02/24/22  9:31 PM   Specimen: STOOL  Result Value Ref Range Status   C Diff antigen NEGATIVE NEGATIVE Final   C Diff toxin NEGATIVE NEGATIVE Final   C Diff interpretation No C. difficile detected.  Final    Comment: Performed at [facility], [address] [place], [place] [address]         Radiology Studies: CT Abdomen Pelvis Wo Contrast  Result Date: 02/23/2022 CLINICAL DATA:  Abdominal pain, weakness, incontinence. EXAM: CT ABDOMEN AND PELVIS WITHOUT CONTRAST TECHNIQUE: Multidetector CT imaging of the abdomen and pelvis was performed following the standard protocol without IV contrast. RADIATION DOSE REDUCTION: This exam was performed according to the departmental dose-optimization program which includes automated exposure control, adjustment of the mA and/or kV according to patient size and/or use of iterative reconstruction technique. COMPARISON:  CT  abdomen pelvis dated 01/26/2015. FINDINGS: Lower chest: Mild bibasilar atelectasis/fibrosis is redemonstrated. Hepatobiliary: No focal liver abnormality is seen. No gallstones, gallbladder wall thickening, or biliary dilatation. Pancreas: Unremarkable. No pancreatic ductal dilatation or surrounding inflammatory changes. Spleen: Normal in size without focal abnormality. Adrenals/Urinary Tract: Adrenal glands are unremarkable. Bilateral benign renal cysts measure  up to 2.4 cm on the right and 6.6 cm on the left. These are not significantly changed since prior exam and no further imaging follow-up is recommended. No renal calculi or hydronephrosis on either side. Bladder is unremarkable. Stomach/Bowel: Stomach is within normal limits. The appendix is surgically absent. No evidence of bowel wall thickening, distention, or inflammatory changes. Vascular/Lymphatic: A bifurcated endograft is unchanged in position with the proximal portion just distal to the origin of the renal arteries and the distal portions in the common iliac arteries. The surrounding aneurysm sac has increased in size since 2016, now measuring 7.9 x 8.1 cm in the axial plane (series 2, image 35) and 10.4 cm in length (series 5, image 34). This measured 5.3 x 5.7 cm in the axial plane and 8.0 cm in length on 01/26/2015. There is no surrounding fat stranding or draping of the aneurysm sac to suggest impending rupture. Atherosclerotic calcifications are seen in the abdominal aorta proximal to the renal arteries. No lymphadenopathy is seen in the abdomen or pelvis. Reproductive: Prostate is unremarkable. Other: No abdominal wall hernia or abnormality. No abdominopelvic ascites. Musculoskeletal: Multiple chronic compression fractures are seen in the spine T10, T12, L1, L2, L3, L4, L5. These have progressed since 01/26/2015. No evidence of acute fracture. The patient is status post fixation of the right femoral neck. The hardware appears intact. Degenerative  changes are seen in the spine. IMPRESSION: 1. Size of an infrarenal abdominal aortic aneurysm since 01/26/2015 without CT evidence of impending rupture. The bifurcated endograft appears unchanged in position. 2. Multiple chronic compression fractures in the thoracic and lumbar spine. 3. No acute process in the abdomen or pelvis. Aortic Atherosclerosis (ICD10-I70.0). Electronically Signed   By: Zerita Boers M.D.   On: 02/23/2022 20:08   CT Head Wo Contrast  Result Date: 02/23/2022 CLINICAL DATA:  Mental status change EXAM: CT HEAD WITHOUT CONTRAST TECHNIQUE: Contiguous axial images were obtained from the base of the skull through the vertex without intravenous contrast. RADIATION DOSE REDUCTION: This exam was performed according to the departmental dose-optimization program which includes automated exposure control, adjustment of the mA and/or kV according to patient size and/or use of iterative reconstruction technique. COMPARISON:  CT head 09/22/2008 FINDINGS: Brain: Progression of generalized atrophy which is now moderate. This is most prominent the temporal lobe. Negative for hydrocephalus. Negative for acute infarct, hemorrhage, mass Vascular: Negative for hyperdense vessel Skull: Negative Sinuses/Orbits: Paranasal sinuses clear. Bilateral cataract extraction Other: None IMPRESSION: No acute abnormality. Progression of atrophy which is now moderate in degree. Electronically Signed   By: Franchot Gallo M.D.   On: 02/23/2022 19:59   DG Chest Port 1 View  Result Date: 02/23/2022 CLINICAL DATA:  Altered level of consciousness, incontinence, weakness EXAM: PORTABLE CHEST 1 VIEW COMPARISON:  03/28/2019 FINDINGS: Single frontal view of the chest demonstrates postsurgical changes from CABG. Multi lead pacer/AICD again identified. Cardiac silhouette is unremarkable. No airspace disease, effusion, or pneumothorax. No acute bony abnormalities. IMPRESSION: 1. No acute intrathoracic process. Electronically Signed    By: Randa Ngo M.D.   On: 02/23/2022 19:08        Scheduled Meds:  apixaban  5 mg Oral BID   citalopram  20 mg Oral Daily   insulin aspart  0-9 Units Subcutaneous Q4H   liver oil-zinc oxide   Topical Daily   metoprolol succinate  50 mg Oral Daily   mirtazapine  15 mg Oral QHS   rosuvastatin  5 mg Oral Q M,W,F  Continuous Infusions:  sodium chloride 75 mL/hr at 02/25/22 0950   cefTRIAXone (ROCEPHIN)  IV Stopped (02/24/22 1732)     LOS: 2 days   Time spent= 35 mins    Ashyah Quizon Arsenio Loader, MD [facility]  If 7PM-7AM, please contact night-coverage  02/25/2022, 12:37 PM

## 2022-02-25 NOTE — Plan of Care (Signed)

## 2022-02-25 NOTE — Progress Notes (Signed)
PT Cancellation Note  Patient Details Name: Nathaniel Woods MRN: [id number] DOB: 1928-09-11   Cancelled Treatment:    Reason Eval/Treat Not Completed: Patient not medically ready. Per RN the pt with significant agitation requiring medication. PT will hold and follow up for evaluation at later date.    Shaundrea Carrigg A Janalee Grobe 02/25/2022, 1:11 PM

## 2022-02-26 DIAGNOSIS — D696 Thrombocytopenia, unspecified: Secondary | ICD-10-CM

## 2022-02-26 DIAGNOSIS — G9341 Metabolic encephalopathy: Secondary | ICD-10-CM | POA: Diagnosis not present

## 2022-02-26 DIAGNOSIS — N3000 Acute cystitis without hematuria: Secondary | ICD-10-CM | POA: Diagnosis not present

## 2022-02-26 DIAGNOSIS — E1159 Type 2 diabetes mellitus with other circulatory complications: Secondary | ICD-10-CM | POA: Diagnosis not present

## 2022-02-26 LAB — CBC
HCT: 31.1 % — ABNORMAL LOW (ref 39.0–52.0)
Hemoglobin: 10 g/dL — ABNORMAL LOW (ref 13.0–17.0)
MCH: 32.8 pg (ref 26.0–34.0)
MCHC: 32.2 g/dL (ref 30.0–36.0)
MCV: 102 fL — ABNORMAL HIGH (ref 80.0–100.0)
Platelets: 136 10*3/uL — ABNORMAL LOW (ref 150–400)
RBC: 3.05 MIL/uL — ABNORMAL LOW (ref 4.22–5.81)
RDW: 14.2 % (ref 11.5–15.5)
WBC: 7.3 10*3/uL (ref 4.0–10.5)
nRBC: 0 % (ref 0.0–0.2)

## 2022-02-26 LAB — BASIC METABOLIC PANEL
Anion gap: 0 — ABNORMAL LOW (ref 5–15)
BUN: 24 mg/dL — ABNORMAL HIGH (ref 8–23)
CO2: 21 mmol/L — ABNORMAL LOW (ref 22–32)
Calcium: 8.3 mg/dL — ABNORMAL LOW (ref 8.9–10.3)
Chloride: 122 mmol/L — ABNORMAL HIGH (ref 98–111)
Creatinine, Ser: 1.27 mg/dL — ABNORMAL HIGH (ref 0.61–1.24)
GFR, Estimated: 53 mL/min — ABNORMAL LOW (ref >60)
Glucose, Bld: 84 mg/dL (ref 70–99)
Potassium: 4.5 mmol/L (ref 3.5–5.1)
Sodium: 143 mmol/L (ref 135–145)

## 2022-02-26 LAB — GLUCOSE, CAPILLARY
Glucose-Capillary: 76 mg/dL (ref 70–99)
Glucose-Capillary: 78 mg/dL (ref 70–99)
Glucose-Capillary: 220 mg/dL — ABNORMAL HIGH (ref 70–99)
Glucose-Capillary: 105 mg/dL — ABNORMAL HIGH (ref 70–99)
Glucose-Capillary: 108 mg/dL — ABNORMAL HIGH (ref 70–99)
Glucose-Capillary: 110 mg/dL — ABNORMAL HIGH (ref 70–99)

## 2022-02-26 MED ORDER — QUETIAPINE FUMARATE 25 MG PO TABS
25.0000 mg | ORAL_TABLET | Freq: Every day | ORAL | Status: DC
  Administered 2022-02-26: 25 mg via ORAL
  Filled 2022-02-26: qty 1

## 2022-02-26 MED ORDER — SPIRONOLACTONE 25 MG PO TABS
12.5000 mg | ORAL_TABLET | Freq: Every day | ORAL | Status: DC
  Administered 2022-02-26 – 2022-02-27 (×2): 12.5 mg via ORAL
  Filled 2022-02-26: qty 0.5
  Filled 2022-02-26: qty 1
  Filled 2022-02-26: qty 0.5

## 2022-02-26 NOTE — TOC Progression Note (Addendum)
Transition of Care [facility]) - Progression Note    Patient Details  Name: Nathaniel Woods MRN: [id number] Date of Birth: October 07, 1928  Transition of Care [facility]) CM/SW [place], RN Phone Number: 02/26/2022, 11:38 AM  Clinical Narrative:      Spoke with the patient's son [name], He is agreeable for the patient to go to [facility] SNF, and would like [facility], I explained that they may not have a bed, he would like a hospital bed sent to the home at DC, I explained that would be set up at DC from facility prior to DC, He stated understanding  He has hired and os looking to hire additional Nurses to work with the patient at home and care for him His plan is to return home with hired help after going to [employer] completed, [employer] obtained [employer] sent, called Seth Bake at [facility] to inquire if they have a bed  [facility] does not have a bed at this time     Expected Discharge Plan and Services                                                 Social Determinants of Health (SDOH) Interventions    Readmission Risk Interventions     No data to display

## 2022-02-26 NOTE — Hospital Course (Signed)
88 y.o. male with medical history significant for CAD s/p CABG, PAF on Eliquis, [id number], systolic CHF s/p AICD, AAA s/p endovascular repair, DM, HTN, prior stroke, BPH who was brought to the ED with a several day history of confusion and decreased oral intake.  Urinalysis ordered by his PCP on 9/14 was consistent with UTI and he was started on doxycycline.  Urine culture showed mixed urogenital flora 25-50,000 colonies.  He was started on IV fluids and Rocephin.  Developed significant agitation.  9/21.  Patient condition much improved, antibiotics completed.  Mental status improved.  Patient was reevaluated by PT/OT, now recommending home with home care.  Will discharge home instead of nursing home.

## 2022-02-26 NOTE — Progress Notes (Signed)
Pt son is concerned about the pt's strength training, he wants patient to get up with PT/OT and walk more and have a more rigorous session each time.  He wants this nurse to relay message to PT/OT services about this concern.  They have left for today so this note is being written and will pass message along to oncoming nurse.  Pt son is concerned about the pt's strength and needs him to be stronger before he takes him home as son is concerned about the SNFs being offered for pt to go to for rehab.

## 2022-02-26 NOTE — Plan of Care (Signed)

## 2022-02-26 NOTE — TOC Progression Note (Signed)
Transition of Care [facility]) - Progression Note    Patient Details  Name: Nathaniel Woods MRN: [id number] Date of Birth: [birth year]  Transition of Care [facility]) CM/SW [place], RN Phone Number: 02/26/2022, 3:55 PM  Clinical Narrative:     Spoke with the son [name], Reviewed the bed offers, He said no to [facility]  and said [facility] is too far, He said he has an in at [facility], I explained that [facility] is full and can not make a bed offer. He said he would consider a room at [facility], I explained that they do not accept patient's from the public, he stated that he will call [facility] and [facility] [place], I explained the difference between ALF and STR SNF. I called the [place] [facility] to see if they would make an exception, left a voicemail for a call back       Expected Discharge Plan and Services                                                 Social Determinants of Health (SDOH) Interventions    Readmission Risk Interventions     No data to display

## 2022-02-26 NOTE — Progress Notes (Signed)
Occupational Therapy Treatment Patient Details Name: Nathaniel Woods MRN: [id number] DOB: 1929-02-27 Today's Date: 02/26/2022   History of present illness Pt is a 88 y.o. male with medical history significant for CAD s/p CABG, PAF on Eliquis, XMI6O, systolic CHF s/p AICD, AAA s/p endovascular repair, DM, HTN, prior stroke, BPH who was brought to the ED with a several day history of confusion and decreased oral intake.  Urinalysis ordered by his PCP on 9/14 was consistent with UTI.   OT comments  Upon entering session pt asleep in bed, however, woke easily to verbal/tactile cues. Pt agreeable to sit up in recliner. Pt required Max A to complete supine to sit. Once sitting at EOB pt required Mod A to stand with RW and then Min A to take steps toward recliner. Pt completed seated grooming tasks with set up A. Pt required step by step VC for sequencing tasks this date. Pt is making progress toward goal completion. D/C recommendation remains appropriate. OT will continue to follow acutely.    Recommendations for follow up therapy are one component of a multi-disciplinary discharge planning process, led by the attending physician.  Recommendations may be updated based on patient status, additional functional criteria and insurance authorization.    Follow Up Recommendations  Skilled nursing-short term rehab (<3 hours/day)    Assistance Recommended at Discharge Frequent or constant Supervision/Assistance  Patient can return home with the following  A lot of help with walking and/or transfers;A lot of help with bathing/dressing/bathroom;Assist for transportation;Help with stairs or ramp for entrance;Direct supervision/assist for medications management;Direct supervision/assist for financial management;Assistance with cooking/housework   Equipment Recommendations  Other (comment) (defer to next venue of care)    Recommendations for Other Services      Precautions / Restrictions  Precautions Precautions: Fall Restrictions Weight Bearing Restrictions: No       Mobility Bed Mobility Overal bed mobility: Needs Assistance Bed Mobility: Supine to Sit     Supine to sit: Max assist     General bed mobility comments: Max A to manage LEs and trunk elevation    Transfers Overall transfer level: Needs assistance Equipment used: Rolling walker (2 wheels) Transfers: Sit to/from Stand, Bed to chair/wheelchair/BSC Sit to Stand: Mod assist     Step pivot transfers: Min assist     General transfer comment: Min A for RW management, Min guard for steps and controlled descent onto chair     Balance Overall balance assessment: Needs assistance Sitting-balance support: Feet supported Sitting balance-Leahy Scale: Good     Standing balance support: Bilateral upper extremity supported, Reliant on assistive device for balance Standing balance-Leahy Scale: Poor Standing balance comment: unsteady initially, improved with time                           ADL either performed or assessed with clinical judgement   ADL Overall ADL's : Needs assistance/impaired     Grooming: Wash/dry face;Sitting;Set up                                      Extremity/Trunk Assessment Upper Extremity Assessment Upper Extremity Assessment: Generalized weakness   Lower Extremity Assessment Lower Extremity Assessment: Generalized weakness   Cervical / Trunk Assessment Cervical / Trunk Assessment: Normal    Vision Baseline Vision/History: 1 Wears glasses Patient Visual Report: No change from baseline     Perception  Praxis      Cognition Arousal/Alertness: Awake/alert Behavior During Therapy: WFL for tasks assessed/performed Overall Cognitive Status: History of cognitive impairments - at baseline                                 General Comments: does answer orientation questions but has situational and conversational awareness  deficits, follows all commands. Step by step VC for sequencing activities        Exercises      Shoulder Instructions       General Comments      Pertinent Vitals/ Pain       Pain Assessment Pain Assessment: Faces Faces Pain Scale: Hurts a little bit Pain Location: sacral wound on bottom Pain Descriptors / Indicators: Grimacing Pain Intervention(s): Monitored during session, Repositioned  Home Living Family/patient expects to be discharged to:: Private residence Living Arrangements: Spouse/significant other Available Help at Discharge: Family;Available PRN/intermittently Type of Home: House Home Access: Stairs to enter [employer] of Steps: 2 Entrance Stairs-Rails: None Home Layout: Two level;Able to live on main level with bedroom/bathroom     Bathroom Shower/Tub: Walk-in shower         Home Equipment: Grab bars - tub/shower;Cane - single Barista (2 wheels)   Additional Comments: Pt reports home is handicap accessible. Son checks on pt daily, has aide that come 4x/wk.      Prior Functioning/Environment              Frequency  Min 2X/week        Progress Toward Goals  OT Goals(current goals can now be found in the care plan section)  Progress towards OT goals: Progressing toward goals  Acute Rehab OT Goals Patient Stated Goal: return to PLOF OT Goal Formulation: With patient Time For Goal Achievement: 03/10/22 Potential to Achieve Goals: Good  Plan Discharge plan remains appropriate;Frequency remains appropriate    Co-evaluation                 AM-PAC OT "6 Clicks" Daily Activity     Outcome Measure   Help from another person eating meals?: None Help from another person taking care of personal grooming?: A Little Help from another person toileting, which includes using toliet, bedpan, or urinal?: A Lot Help from another person bathing (including washing, rinsing, drying)?: A Lot Help from another person to put  on and taking off regular upper body clothing?: A Little Help from another person to put on and taking off regular lower body clothing?: A Lot 6 Click Score: 16    End of Session Equipment Utilized During Treatment: Gait belt;Rolling walker (2 wheels)  OT Visit Diagnosis: Unsteadiness on feet (R26.81);History of falling (Z91.81);Muscle weakness (generalized) (M62.81);Other symptoms and signs involving cognitive function;Pain Pain - part of body:  (sacral wound on bottom)   Activity Tolerance Patient tolerated treatment well   Patient Left in chair;with call bell/phone within reach;with chair alarm set;with nursing/sitter in room   Nurse Communication Mobility status        Time: 0626-9485 OT Time Calculation (min): 16 min  Charges: OT General Charges $OT Visit: 1 Visit OT Treatments $Self Care/Home Management : 8-22 mins  [facility] MS, OTR/L ascom [phone]  02/26/22, 12:56 PM

## 2022-02-26 NOTE — Progress Notes (Signed)
  Progress Note   Patient: Nathaniel Woods JAS:[id number] DOB: [birth year] DOA: 02/23/2022     3 DOS: the patient was seen and examined on 02/26/2022   Brief hospital course: 88 y.o. male with medical history significant for CAD s/p CABG, PAF on Eliquis, ALP3X, systolic CHF s/p AICD, AAA s/p endovascular repair, DM, HTN, prior stroke, BPH who was brought to the ED with a several day history of confusion and decreased oral intake.  Urinalysis ordered by his PCP on 9/14 was consistent with UTI and he was started on doxycycline.  Urine culture showed mixed urogenital flora 25-50,000 colonies.  He was started on IV fluids and Rocephin.  Developed significant agitation.  Assessment and Plan:  * Acute metabolic encephalopathy with significant agitation and delirium. Urinary tract infection Patient condition seem to be improving.  Completed 3 doses of Rocephin.  Blood culture negative, urine culture not sent out at time of admission.   stage 3a chronic kidney disease ([place]) Acute kidney injury ruled out. Creatinine 1.67 up from baseline of 1.2.  Does not meet criteria for acute kidney injury. Renal function has back to baseline.  Mild thrombocytopenia. Follow.   PAF (paroxysmal atrial fibrillation) (HCC) Continue Eliquis and metoprolol   Chronic systolic CHF (congestive heart failure) ([place]) S/p AICD Start diuretics as patient has increased crackles in the base.   S/P CABG x 4 No complaints of chest pain Continue metoprolol, statin and Eliquis   Abdominal aortic aneurysm (HCC) CT abdomen and pelvis with no acute findings. Chronic compression thoracic and lumbar compression fx.    HTN (hypertension) Continue home meds   Type II diabetes mellitus (HCC) Sliding scale insulin coverage.  Hold metformin       Subjective:  Patient doing well today, he is very hard of hearing, but does not seem to have any confusion.  He slept well last night per nurse.  Physical Exam: Vitals:   02/25/22  0846 02/25/22 2341 02/26/22 0813 02/26/22 0935  BP: 125/80 101/62 127/62 128/62  Pulse: 73 63 (!) 59 65  Resp:  '18 16 20  '$ Temp:  98.7 F (37.1 C) 97.7 F (36.5 C) 98.2 F (36.8 C)  TempSrc:    Axillary  SpO2:  98% 100% 98%  Weight:      Height:       General exam: Appears calm and comfortable  Respiratory system: Clear to auscultation. Respiratory effort normal. Cardiovascular system: S1 & S2 heard, RRR. No JVD, murmurs, rubs, gallops or clicks. No pedal edema. Gastrointestinal system: Abdomen is nondistended, soft and nontender. No organomegaly or masses felt. Normal bowel sounds heard. Central nervous system: Alert and oriented x1. No focal neurological deficits. Extremities: Symmetric 5 x 5 power. Skin: No rashes, lesions or ulcers   Data Reviewed:  Lab results reviewed.  Family Communication:   Disposition: Status is: Inpatient Remains inpatient appropriate because: Severity of disease, unsafe discharge.  Planned Discharge Destination: Skilled nursing facility    Time spent: 35 minutes  Author: Sharen Hones, MD 02/26/2022 3:34 PM  For on call review [email or web address].

## 2022-02-26 NOTE — Progress Notes (Signed)
Per request of pt's son, pt was able to walk out of room and down hallway to nurses station and back to room, total ambulation about 158f.  Pt may have no problems walking a couple laps around unit, balance it the main factor for the pt as he is able to walk with minimum assist.

## 2022-02-26 NOTE — Evaluation (Signed)
Physical Therapy Evaluation Patient Details Name: Nathaniel Woods MRN: [id number] DOB: 1929-01-16 Today's Date: 02/26/2022  History of Present Illness  Pt is a 88 y.o. male with medical history significant for CAD s/p CABG, PAF on Eliquis, DJS9F, systolic CHF s/p AICD, AAA s/p endovascular repair, DM, HTN, prior stroke, BPH who was brought to the ED with a several day history of confusion and decreased oral intake.  Urinalysis ordered by his PCP on 9/14 was consistent with UTI.   Clinical Impression  Patient alert agreeable to PT, oriented to date and town, but did display some situational and safety awareness deficits. Able to follow all commands, HOH. PLOF gathered from chart but pt stated he does do cooking/cleaning, and ambulates with a walker.   The patient needed extended time for bed mobility and ultimately minA to weight shift and trunk elevation. Sit <> stand with RW and minAx2, some posterior lean noted with initial standing balance, improved with time and repositioning of his feet. CGAx2 with RW to take several steps to recliner in the room but he needed near constant cueing for safety including when preparing to sit. True ambulation deferred due to arrival of breakfast, pt assisted with set up and RN student in room at end of session.  Overall the patient demonstrated deficits (see "PT Problem List") that impede the patient's functional abilities, safety, and mobility and would benefit from skilled PT intervention. Recommendation at this time is SNF due to current level of assistance needed and decline from PLOF.        Recommendations for follow up therapy are one component of a multi-disciplinary discharge planning process, led by the attending physician.  Recommendations may be updated based on patient status, additional functional criteria and insurance authorization.  Follow Up Recommendations Skilled nursing-short term rehab (<3 hours/day) Can patient physically be transported by  private vehicle: Yes    Assistance Recommended at Discharge Frequent or constant Supervision/Assistance  Patient can return home with the following  A lot of help with walking and/or transfers;A lot of help with bathing/dressing/bathroom;Assistance with cooking/housework;Assist for transportation;Help with stairs or ramp for entrance;Direct supervision/assist for medications management    Equipment Recommendations Other (comment) (TBD)  Recommendations for Other Services       Functional Status Assessment Patient has had a recent decline in their functional status and demonstrates the ability to make significant improvements in function in a reasonable and predictable amount of time.     Precautions / Restrictions Precautions Precautions: Fall Restrictions Weight Bearing Restrictions: No      Mobility  Bed Mobility Overal bed mobility: Needs Assistance Bed Mobility: Supine to Sit     Supine to sit: Min assist     General bed mobility comments: effortful for pt, extended time, at least minA for weight shift and trunk elevation    Transfers Overall transfer level: Needs assistance Equipment used: Rolling walker (2 wheels) Transfers: Sit to/from Stand, Bed to chair/wheelchair/BSC Sit to Stand: Min assist, +2 physical assistance   Step pivot transfers: Min guard, +2 safety/equipment            Ambulation/Gait               General Gait Details: true ambulation deferred due to arrival of breakfast but did take several steps towards recliner, near constant cueing to task/improve safety  Stairs            Wheelchair Mobility    Modified Rankin (Stroke Patients Only)  Balance Overall balance assessment: Needs assistance Sitting-balance support: Feet supported Sitting balance-Leahy Scale: Good     Standing balance support: Bilateral upper extremity supported, Reliant on assistive device for balance Standing balance-Leahy Scale: Poor Standing  balance comment: initially with posterior lean, corrected with time                             Pertinent Vitals/Pain Pain Assessment Pain Assessment: Faces Faces Pain Scale: No hurt Pain Location: sacral wound on bottom Pain Descriptors / Indicators: Grimacing Pain Intervention(s): Monitored during session, Repositioned, Limited activity within patient's tolerance    Home Living Family/patient expects to be discharged to:: Private residence Living Arrangements: Spouse/significant other Available Help at Discharge: Family;Available PRN/intermittently Type of Home: House Home Access: Stairs to enter Entrance Stairs-Rails: None Entrance Stairs-Number of Steps: 2   Home Layout: Two level;Able to live on main level with bedroom/bathroom Home Equipment: Grab bars - tub/shower;Cane - single Barista (2 wheels) Additional Comments: Pt reports home is handicap accessible. Son checks on pt daily, has aide that come 4x/wk.    Prior Function Prior Level of Function : Needs assist  Cognitive Assist : Mobility (cognitive);ADLs (cognitive)     Physical Assist : ADLs (physical)     Mobility Comments: Pt reports IND with RW for short household distances. History of falling but unsure if in past 6 months. ADLs Comments: Pt reports IND with dressing. Son provides transportation and picks up groceries. Requires supervision for showering, aide assists with cooking, has hired cleaners. Pt sleeps in recliner, has urinary urgency (wears briefs at baseline).     Hand Dominance        Extremity/Trunk Assessment   Upper Extremity Assessment Upper Extremity Assessment: Generalized weakness    Lower Extremity Assessment Lower Extremity Assessment: Generalized weakness    Cervical / Trunk Assessment Cervical / Trunk Assessment: Normal  Communication   Communication: HOH (wearing hearing aids)  Cognition Arousal/Alertness: Awake/alert Behavior During Therapy: WFL for  tasks assessed/performed Overall Cognitive Status: History of cognitive impairments - at baseline                                 General Comments: does answer orientation questions but has situational and conversational awareness deficits, follows all commands        General Comments      Exercises     Assessment/Plan    PT Assessment Patient needs continued PT services  PT Problem List Decreased strength;Decreased mobility;Decreased activity tolerance;Decreased balance;Decreased knowledge of use of DME;Decreased safety awareness       PT Treatment Interventions DME instruction;Therapeutic exercise;Gait training;Balance training;Stair training;Neuromuscular re-education;Functional mobility training;Therapeutic activities;Patient/family education    PT Goals (Current goals can be found in the Care Plan section)  Acute Rehab PT Goals Patient Stated Goal: to feel better PT Goal Formulation: With patient Time For Goal Achievement: 03/12/22 Potential to Achieve Goals: Good    Frequency Min 2X/week     Co-evaluation               AM-PAC PT "6 Clicks" Mobility  Outcome Measure Help needed turning from your back to your side while in a flat bed without using bedrails?: A Little Help needed moving from lying on your back to sitting on the side of a flat bed without using bedrails?: A Little Help needed moving to and from a bed to a chair (  including a wheelchair)?: A Little Help needed standing up from a chair using your arms (e.g., wheelchair or bedside chair)?: A Lot Help needed to walk in hospital room?: A Little Help needed climbing 3-5 steps with a railing? : A Lot 6 Click Score: 16    End of Session Equipment Utilized During Treatment: Gait belt Activity Tolerance: Patient tolerated treatment well Patient left: in chair;with chair alarm set;with call bell/phone within reach;Other (comment) (nursing student at bedside) Nurse Communication: Mobility  status PT Visit Diagnosis: Other abnormalities of gait and mobility (R26.89);Difficulty in walking, not elsewhere classified (R26.2);Muscle weakness (generalized) (M62.81)    Time: 9826-4158 PT Time Calculation (min) (ACUTE ONLY): 12 min   Charges:   PT Evaluation $PT Eval Low Complexity: 1 Low          Lieutenant Diego PT, DPT 9:40 AM,02/26/22

## 2022-02-26 NOTE — NC FL2 (Signed)
[place] LEVEL OF CARE SCREENING TOOL     IDENTIFICATION  Patient Name: Nathaniel Woods Birthdate: [birth year] Sex: male Admission Date (Current Location): 02/23/2022  [facility] and [place] Number:  Engineering geologist and Address:  [facility], [address] [place], [place] [address]      Provider Number: [id number]  Attending Physician Name and Address:  Sharen Hones, MD  Relative Name and Phone Number:  [name] SOn [phone]    Current Level of Care: Hospital Recommended Level of Care: [place] Prior Approval Number:    Date Approved/Denied:   PASRR Number: [id number]  Discharge Plan: SNF    Current Diagnoses: Patient Active Problem List   Diagnosis Date Noted   Acute metabolic encephalopathy 01/20/4817   Chronic systolic CHF (congestive heart failure) (HCC)    PAF (paroxysmal atrial fibrillation) (HCC)    Urinary tract infection    Acute renal failure superimposed on stage 3a chronic kidney disease ([place])    Hip fracture ([place]) 03/28/2019   Carotid stenosis 08/14/2017   AICD (automatic cardioverter/defibrillator) present 12/17/2016   Type II diabetes mellitus ([place]) 11/02/2011   CAD in native artery 11/02/2011   HTN (hypertension) 11/02/2011   Abdominal aortic aneurysm ([place]) 11/02/2011   H/O Ventricular Fibrillation 11/02/2011   S/P CABG x 4 11/02/2011   Hemorrhagic stroke (HCC)    Hyperlipidemia     Orientation RESPIRATION BLADDER Height & Weight     Self, Place, Situation  Normal Continent, External catheter Weight: 77.9 kg Height:  '6\' 3"'$  (190.5 cm)  BEHAVIORAL SYMPTOMS/MOOD NEUROLOGICAL BOWEL NUTRITION STATUS      Incontinent Diet (See DC summary)  AMBULATORY STATUS COMMUNICATION OF NEEDS Skin   Extensive Assist Verbally Normal                       Personal Care Assistance Level of Assistance  Bathing, Feeding, Dressing Bathing Assistance: Maximum assistance Feeding assistance: Limited  assistance Dressing Assistance: Maximum assistance     Functional Limitations Info             SPECIAL CARE FACTORS FREQUENCY  PT (By licensed PT), OT (By licensed OT)     PT Frequency: 5 times per week OT Frequency: 5 times per week            Contractures Contractures Info: Not present    Additional Factors Info  Code Status, Allergies Code Status Info: Full code Allergies Info: Levofloxacin, Ambien (Zolpidem Tartrate), Aspirin, Bee Venom, Chlorpheniramine, Decongestant (Pseudoephedrine Hcl), Simvastatin, Zolpidem, Contrast Media (Iodinated Contrast Media)           Current Medications (02/26/2022):  This is the current hospital active medication list Current Facility-Administered Medications  Medication Dose Route Frequency Provider Last Rate Last Admin   acetaminophen (TYLENOL) tablet 650 mg  650 mg Oral Q6H PRN Athena Masse, MD       Or   acetaminophen (TYLENOL) suppository 650 mg  650 mg Rectal Q6H PRN Athena Masse, MD       apixaban Arne Cleveland) tablet 5 mg  5 mg Oral BID Dorothe Pea, RPH   5 mg at 02/26/22 5631   citalopram (CELEXA) tablet 20 mg  20 mg Oral Daily Judd Gaudier V, MD   20 mg at 02/26/22 0942   guaiFENesin (ROBITUSSIN) 100 MG/5ML liquid 5 mL  5 mL Oral Q4H PRN Amin, Ankit Chirag, MD       hydrALAZINE (APRESOLINE) injection 10 mg  10 mg Intravenous Q4H PRN Amin, Ankit Chirag, MD       insulin aspart (novoLOG) injection 0-9 Units  0-9 Units Subcutaneous Q4H Athena Masse, MD   1 Units at 02/24/22 2120   ipratropium-albuterol (DUONEB) 0.5-2.5 (3) MG/3ML nebulizer solution 3 mL  3 mL Nebulization Q4H PRN Amin, Ankit Chirag, MD       liver oil-zinc oxide (DESITIN) 40 % ointment   Topical Daily Damita Lack, MD   Given at 02/26/22 0943   metoprolol succinate (TOPROL-XL) 24 hr tablet 50 mg  50 mg Oral Daily Judd Gaudier V, MD   50 mg at 02/26/22 0942   metoprolol tartrate (LOPRESSOR) injection 5 mg  5 mg Intravenous Q4H PRN Amin, Ankit  Chirag, MD       mirtazapine (REMERON) tablet 15 mg  15 mg Oral QHS Judd Gaudier V, MD   15 mg at 02/25/22 2141   OLANZapine (ZYPREXA) injection 5 mg  5 mg Intramuscular Q6H PRN Amin, Ankit Chirag, MD       ondansetron (ZOFRAN) tablet 4 mg  4 mg Oral Q6H PRN Athena Masse, MD       Or   ondansetron [facility]) injection 4 mg  4 mg Intravenous Q6H PRN Athena Masse, MD       rosuvastatin (CRESTOR) tablet 5 mg  5 mg Oral Q M,W,F Athena Masse, MD   5 mg at 02/26/22 8811   senna-docusate (Senokot-S) tablet 1 tablet  1 tablet Oral QHS PRN Damita Lack, MD         Discharge Medications: Please see discharge summary for a list of discharge medications.  Relevant Imaging Results:  Relevant Lab Results:   Additional Information SSN: [id number]  Conception Oms, RN

## 2022-02-27 DIAGNOSIS — I5022 Chronic systolic (congestive) heart failure: Secondary | ICD-10-CM

## 2022-02-27 DIAGNOSIS — N3000 Acute cystitis without hematuria: Secondary | ICD-10-CM | POA: Diagnosis not present

## 2022-02-27 DIAGNOSIS — G9341 Metabolic encephalopathy: Secondary | ICD-10-CM | POA: Diagnosis not present

## 2022-02-27 LAB — CBC
HCT: 31.3 % — ABNORMAL LOW (ref 39.0–52.0)
Hemoglobin: 10.2 g/dL — ABNORMAL LOW (ref 13.0–17.0)
MCH: 32.4 pg (ref 26.0–34.0)
MCHC: 32.6 g/dL (ref 30.0–36.0)
MCV: 99.4 fL (ref 80.0–100.0)
Platelets: 140 10*3/uL — ABNORMAL LOW (ref 150–400)
RBC: 3.15 MIL/uL — ABNORMAL LOW (ref 4.22–5.81)
RDW: 14.2 % (ref 11.5–15.5)
WBC: 8.5 10*3/uL (ref 4.0–10.5)
nRBC: 0 % (ref 0.0–0.2)

## 2022-02-27 LAB — BASIC METABOLIC PANEL
Anion gap: 3 — ABNORMAL LOW (ref 5–15)
BUN: 23 mg/dL (ref 8–23)
CO2: 20 mmol/L — ABNORMAL LOW (ref 22–32)
Calcium: 8.8 mg/dL — ABNORMAL LOW (ref 8.9–10.3)
Chloride: 119 mmol/L — ABNORMAL HIGH (ref 98–111)
Creatinine, Ser: 1.12 mg/dL (ref 0.61–1.24)
GFR, Estimated: >60 mL/min (ref >60)
Glucose, Bld: 89 mg/dL (ref 70–99)
Potassium: 3.6 mmol/L (ref 3.5–5.1)
Sodium: 142 mmol/L (ref 135–145)

## 2022-02-27 LAB — GLUCOSE, CAPILLARY
Glucose-Capillary: 70 mg/dL (ref 70–99)
Glucose-Capillary: 89 mg/dL (ref 70–99)
Glucose-Capillary: 91 mg/dL (ref 70–99)
Glucose-Capillary: 99 mg/dL (ref 70–99)

## 2022-02-27 LAB — MAGNESIUM: Magnesium: 2 mg/dL (ref 1.7–2.4)

## 2022-02-27 MED ORDER — SODIUM BICARBONATE 650 MG PO TABS: 650.0000 mg | ORAL_TABLET | Freq: Two times a day (BID) | ORAL | 0 refills | Status: AC

## 2022-02-27 MED ORDER — MIRTAZAPINE 15 MG PO TABS: 15.0000 mg | ORAL_TABLET | Freq: Every day | ORAL | Status: DC

## 2022-02-27 NOTE — Progress Notes (Signed)
Occupational Therapy Treatment Patient Details Name: Nathaniel Woods MRN: [id number] DOB: June 15, 1928 Today's Date: 02/27/2022   History of present illness Pt is a 88 y.o. male with medical history significant for CAD s/p CABG, PAF on Eliquis, ERD4Y, systolic CHF s/p AICD, AAA s/p endovascular repair, DM, HTN, prior stroke, BPH who was brought to the ED with a several day history of confusion and decreased oral intake.  Urinalysis ordered by his PCP on 9/14 was consistent with UTI.   OT comments  Chart reviewed, pt greeted in room with son present, agreeable to OT tx session. Pt son reports improved cognition from evaluation. Pt is able to follow one step directions with increased time, fair safety awareness. Tx session targeted improving safety during ADL completion via improving activity tolerance. Improvements noted in functional mobility with pt able to amb 100' with RW with supervision-CGA. Grooming tasks completed with set up. Discharge recommendation have been updated to home with [place]. OT will continue to follow acutely.    Recommendations for follow up therapy are one component of a multi-disciplinary discharge planning process, led by the attending physician.  Recommendations may be updated based on patient status, additional functional criteria and insurance authorization.    Follow Up Recommendations  Home health OT    Assistance Recommended at Discharge Frequent or constant Supervision/Assistance  Patient can return home with the following  A lot of help with walking and/or transfers;A lot of help with bathing/dressing/bathroom;Assist for transportation;Help with stairs or ramp for entrance;Direct supervision/assist for medications management;Direct supervision/assist for financial management;Assistance with cooking/housework   Equipment Recommendations  None recommended by OT (pt has recommended equipment at home)    Recommendations for Other Services      Precautions /  Restrictions Precautions Precautions: Fall Restrictions Weight Bearing Restrictions: No       Mobility Bed Mobility               General bed mobility comments: NT pt in chair pre/post session    Transfers Overall transfer level: Needs assistance Equipment used: Rolling walker (2 wheels) Transfers: Sit to/from Stand Sit to Stand: Supervision, Min guard                 Balance Overall balance assessment: Needs assistance Sitting-balance support: Feet supported Sitting balance-Leahy Scale: Good     Standing balance support: Reliant on assistive device for balance Standing balance-Leahy Scale: Fair                             ADL either performed or assessed with clinical judgement   ADL Overall ADL's : Needs assistance/impaired     Grooming: Wash/dry face;Sitting;Brushing hair;Set up                   Toilet Transfer: Supervision/safety;Rolling walker (2 wheels);Min guard;Ambulation Toilet Transfer Details (indicate cue type and reason): simulated         Functional mobility during ADLs: Supervision/safety;Min guard;Rolling walker (2 wheels) (approx 100' with RW, intermittent vcs for safety)      Extremity/Trunk Assessment              Vision       Perception     Praxis      Cognition Arousal/Alertness: Awake/alert Behavior During Therapy: WFL for tasks assessed/performed Overall Cognitive Status: History of cognitive impairments - at baseline  General Comments: pt son reports pt cognition improved, good one step direction following, fair-good safety awareness        Exercises      Shoulder Instructions       General Comments      Pertinent Vitals/ Pain       Pain Assessment Pain Assessment: No/denies pain  Home Living                                          Prior Functioning/Environment              Frequency  Min 2X/week         Progress Toward Goals  OT Goals(current goals can now be found in the care plan section)  Progress towards OT goals: Progressing toward goals     Plan Frequency remains appropriate;Discharge plan needs to be updated    Co-evaluation                 AM-PAC OT "6 Clicks" Daily Activity     Outcome Measure   Help from another person eating meals?: None Help from another person taking care of personal grooming?: None Help from another person toileting, which includes using toliet, bedpan, or urinal?: A Little Help from another person bathing (including washing, rinsing, drying)?: A Little Help from another person to put on and taking off regular upper body clothing?: None Help from another person to put on and taking off regular lower body clothing?: A Little 6 Click Score: 21    End of Session Equipment Utilized During Treatment: Gait belt;Rolling walker (2 wheels)  OT Visit Diagnosis: Unsteadiness on feet (R26.81);History of falling (Z91.81);Muscle weakness (generalized) (M62.81);Other symptoms and signs involving cognitive function;Pain   Activity Tolerance Patient tolerated treatment well   Patient Left in chair;with call bell/phone within reach;with chair alarm set   Nurse Communication Mobility status        Time: 7622-6333 OT Time Calculation (min): 18 min  Charges: OT General Charges $OT Visit: 1 Visit OT Treatments $Therapeutic Activity: 8-22 mins  Shanon Payor, OTD OTR/L  02/27/22, 1:37 PM

## 2022-02-27 NOTE — Progress Notes (Cosign Needed)
Patient is not able to walk the distance required to go the bathroom, or he/she is unable to safely negotiate stairs required to access the bathroom.  A 3in1 BSC will alleviate this problem  

## 2022-02-27 NOTE — TOC Progression Note (Signed)
Transition of Care [facility]) - Progression Note    Patient Details  Name: Nathaniel Woods MRN: [id number] Date of Birth: 1928-07-09  Transition of Care [facility]) CM/SW [place], RN Phone Number: 02/27/2022, 11:25 AM  Clinical Narrative:      Plans are changed to go home with [facility] services      Expected Discharge Plan and Services                                                 Social Determinants of Health (SDOH) Interventions    Readmission Risk Interventions     No data to display

## 2022-02-27 NOTE — Progress Notes (Signed)
Physical Therapy Treatment Patient Details Name: Nathaniel Woods MRN: [id number] DOB: 06-22-1928 Today's Date: 02/27/2022   History of Present Illness Pt is a 88 y.o. male with medical history significant for CAD s/p CABG, PAF on Eliquis, UMP5T, systolic CHF s/p AICD, AAA s/p endovascular repair, DM, HTN, prior stroke, BPH who was brought to the ED with a several day history of confusion and decreased oral intake.  Urinalysis ordered by his PCP on 9/14 was consistent with UTI.    PT Comments    Patient alert, oriented to self, family in room, [facility] but much more awareness, conversational and improved tolerance today. He was sitting EOB with family in prep to ambulate to the bathroom. minA from EOB with RW as wellas from low toilet this session. With extra time and slightly elevated surface pt able to stand with CGA (has a lift recliner at home). He ambulated the nursing loop twice (seated rest break in between bouts, ~364f total). No LOB CGA-supervision, family endorsed gait appeared similar to baseline, as well as improvement in cognition overall. Pt/pt/family discussed discharge options, due to pt progress and assistance available at home pt updated to [place] supervision at this time. Seated and standing exercise packets also administered, and RN and MD notified of change in recommendations.       Recommendations for follow up therapy are one component of a multi-disciplinary discharge planning process, led by the attending physician.  Recommendations may be updated based on patient status, additional functional criteria and insurance authorization.  Follow Up Recommendations  Home health PT Can patient physically be transported by private vehicle: Yes   Assistance Recommended at Discharge Frequent or constant Supervision/Assistance  Patient can return home with the following A little help with bathing/dressing/bathroom;Assistance with cooking/housework;Assist for transportation;Help  with stairs or ramp for entrance;Direct supervision/assist for medications management   Equipment Recommendations  BSC/3in1    Recommendations for Other Services       Precautions / Restrictions Precautions Precautions: Fall Restrictions Weight Bearing Restrictions: No     Mobility  Bed Mobility               General bed mobility comments: pt sitting EOB with family upon entrance    Transfers Overall transfer level: Needs assistance Equipment used: Rolling walker (2 wheels) Transfers: Sit to/from Stand Sit to Stand: Min assist, Min guard           General transfer comment: minA from low EOB and toilet, with slightly elevated surface and extra time, CGA to stand. pt with lift recliner at home    Ambulation/Gait Ambulation/Gait assistance: Min guard, Supervision Gait Distance (Feet):  (1972f a seated rest break, and additional 18023fAssistive device: Rolling walker (2 wheels)         General Gait Details: shoes donned in standing with CGA-minA. no LOB with ambulation, narrow base of support, family endorsed ambulation is similiar to baseline   [employer] (Stroke Patients Only)       Balance Overall balance assessment: Needs assistance Sitting-balance support: Feet supported Sitting balance-Leahy Scale: Good Sitting balance - Comments: pericare in sitting without assistance   Standing balance support: Reliant on assistive device for balance Standing balance-Leahy Scale: Fair Standing balance comment: reliant on BUE support for dynamic tasks/ambulation  Cognition Arousal/Alertness: Awake/alert Behavior During Therapy: WFL for tasks assessed/performed Overall Cognitive Status: History of cognitive impairments - at baseline                                 General Comments: family endorsed cognition much improved since admission. pt able to  follow all commands, much more directable and improved safety noted as well        Exercises      General Comments        Pertinent Vitals/Pain Pain Assessment Pain Assessment: No/denies pain    Home Living                          Prior Function            PT Goals (current goals can now be found in the care plan section) Progress towards PT goals: Progressing toward goals    Frequency    Min 2X/week      PT Plan Discharge plan needs to be updated    Co-evaluation              AM-PAC PT "6 Clicks" Mobility   Outcome Measure  Help needed turning from your back to your side while in a flat bed without using bedrails?: None Help needed moving from lying on your back to sitting on the side of a flat bed without using bedrails?: None Help needed moving to and from a bed to a chair (including a wheelchair)?: None Help needed standing up from a chair using your arms (e.g., wheelchair or bedside chair)?: A Little Help needed to walk in hospital room?: A Little Help needed climbing 3-5 steps with a railing? : A Little 6 Click Score: 21    End of Session Equipment Utilized During Treatment: Gait belt Activity Tolerance: Patient tolerated treatment well Patient left: in chair;with call bell/phone within reach;with family/visitor present Nurse Communication: Mobility status PT Visit Diagnosis: Other abnormalities of gait and mobility (R26.89);Difficulty in walking, not elsewhere classified (R26.2);Muscle weakness (generalized) (M62.81)     Time: 7124-5809 PT Time Calculation (min) (ACUTE ONLY): 30 min  Charges:  $Therapeutic Exercise: 8-22 mins $Therapeutic Activity: 8-22 mins                     Lieutenant Diego PT, DPT 11:30 AM,02/27/22

## 2022-02-27 NOTE — Progress Notes (Signed)
Met with the patient and his son at the bedside He will go home with [facility] for [facility] PT and OT, the son has hired private duty nurses [employer] will deliver a 3 in 1 to the room The son will transport

## 2022-02-27 NOTE — Discharge Summary (Addendum)
Physician Discharge Summary   Patient: Nathaniel Woods MRN: [id number] DOB: October 06, 1928  Admit date:     02/23/2022  Discharge date: 02/27/22  Discharge Physician: Sharen Hones   PCP: Derinda Late, MD   Recommendations at discharge:   Follow-up with PCP in 1 week.  Discharge Diagnoses: Principal Problem:   Acute metabolic encephalopathy Active Problems:   Urinary tract infection   Type II diabetes mellitus (HCC)   HTN (hypertension)   Abdominal aortic aneurysm (HCC)   S/P CABG x 4   Chronic systolic CHF (congestive heart failure) (HCC)   AICD (automatic cardioverter/defibrillator) present   PAF (paroxysmal atrial fibrillation) (HCC)   Thrombocytopenia (HCC)  Resolved Problems:   * No resolved hospital problems. *  Hospital Course: 88 y.o. male with medical history significant for CAD s/p CABG, PAF on Eliquis, HDQ2I, systolic CHF s/p AICD, AAA s/p endovascular repair, DM, HTN, prior stroke, BPH who was brought to the ED with a several day history of confusion and decreased oral intake.  Urinalysis ordered by his PCP on 9/14 was consistent with UTI and he was started on doxycycline.  Urine culture showed mixed urogenital flora 25-50,000 colonies.  He was started on IV fluids and Rocephin.  Developed significant agitation.  9/21.  Patient condition much improved, antibiotics completed.  Mental status improved.  Patient was reevaluated by PT/OT, now recommending home with home care.  Will discharge home instead of nursing home.  Assessment and Plan: * Acute metabolic encephalopathy with significant agitation and delirium. Urinary tract infection Patient condition seem to be improving.  Completed 3 doses of Rocephin.  Blood culture negative, urine culture not sent out at time of admission.   stage 3a chronic kidney disease ([place]) Acute kidney injury ruled out. Mild metabolic acidosis. Creatinine 1.67 up from baseline of 1.2.  Does not meet criteria for acute kidney injury. Renal  function has back to baseline. Patient has very mild metabolic acidosis, will give a few days of sodium bicarbonate which is ordered separately.   Mild thrombocytopenia. Stable, follow with PCP.    PAF (paroxysmal atrial fibrillation) (HCC) Continue Eliquis and metoprolol   Chronic systolic CHF (congestive heart failure) ([place]) S/p AICD No evidence of exacerbation.   S/P CABG x 4 No complaints of chest pain Continue metoprolol, statin and Eliquis   Abdominal aortic aneurysm (HCC) CT abdomen and pelvis with no acute findings. Chronic compression thoracic and lumbar compression fx.    HTN (hypertension) Continue home meds   Type II diabetes mellitus (HCC) Sliding scale insulin coverage.  Hold metformin           Consultants: None Procedures performed: None  Disposition: Home health Diet recommendation:  Discharge Diet Orders (From admission, onward)     Start     Ordered   02/27/22 0000  Diet - low sodium heart healthy        02/27/22 1202           Cardiac diet DISCHARGE MEDICATION: Allergies as of 02/27/2022       Reactions   Levofloxacin Rash   Ambien [zolpidem Tartrate] Other (See Comments)   Hallucinations and per pt he walked around at night and doesn't remember.   Aspirin Other (See Comments)   Hemorrhagic Stroke   Bee Venom Other (See Comments)   Reaction: fever   Chlorpheniramine Other (See Comments)   Reaction: unknown   Decongestant [pseudoephedrine Hcl] Other (See Comments)   Reaction: unknown   Simvastatin Other (See Comments)   Other  reaction(s): Muscle Pain   Zolpidem Other (See Comments)   Other reaction(s): Unknown "went bananas"   Contrast Media [iodinated Contrast Media] Rash        Medication List     STOP taking these medications    doxycycline 100 MG tablet Commonly known as: VIBRA-TABS   losartan 25 MG tablet Commonly known as: COZAAR   tacrolimus 0.1 % ointment Commonly known as: PROTOPIC       TAKE these  medications    acetaminophen 325 MG tablet Commonly known as: TYLENOL Take 2 tablets (650 mg total) by mouth every 6 (six) hours as needed for mild pain (or Fever >/= 101).   apixaban 5 MG Tabs tablet Commonly known as: ELIQUIS Take 5 mg by mouth 2 (two) times daily.   Calcium 600+D3 Plus Minerals 600-800 MG-UNIT Tabs Take 1 tablet by mouth every evening.   citalopram 20 MG tablet Commonly known as: CELEXA Take 20 mg by mouth every evening.   CoQ10 200 MG Caps Take 200 mg by mouth every evening.   finasteride 5 MG tablet Commonly known as: PROSCAR Take 5 mg by mouth every evening.   furosemide 20 MG tablet Commonly known as: LASIX Take 20 mg by mouth in the morning.   latanoprost 0.005 % ophthalmic solution Commonly known as: XALATAN Place 1 drop into both eyes at bedtime.   metoprolol succinate 25 MG 24 hr tablet Commonly known as: TOPROL-XL Take 25 mg by mouth daily.   mirtazapine 15 MG tablet Commonly known as: REMERON Take 1 tablet (15 mg total) by mouth at bedtime.   montelukast 10 MG tablet Commonly known as: SINGULAIR Take 10 mg by mouth at bedtime.   rosuvastatin 5 MG tablet Commonly known as: CRESTOR Take 5 mg by mouth every Monday, Wednesday, and Friday. (Evening)   spironolactone 25 MG tablet Commonly known as: ALDACTONE Take 12.5 mg by mouth in the morning.               Durable Medical Equipment  (From admission, onward)           Start     Ordered   02/27/22 1146  For home use only DME Bedside commode  Once       Question:  Patient needs a bedside commode to treat with the following condition  Answer:  Impaired mobility   02/27/22 1145            Follow-up Information     Derinda Late, MD Follow up in 1 week(s).   Specialty: Family Medicine Contact information: [address] [address] [phone]                Discharge Exam: Filed Weights   02/23/22 1832 02/24/22 0031  Weight: 77.1 kg  77.9 kg   General exam: Appears calm and comfortable  Respiratory system: Clear to auscultation. Respiratory effort normal. Cardiovascular system: S1 & S2 heard, RRR. No JVD, murmurs, rubs, gallops or clicks. No pedal edema. Gastrointestinal system: Abdomen is nondistended, soft and nontender. No organomegaly or masses felt. Normal bowel sounds heard. Central nervous system: Alert and oriented x2. No focal neurological deficits. Extremities: Symmetric 5 x 5 power. Skin: No rashes, lesions or ulcers Psychiatry: Judgement and insight appear normal. Mood & affect appropriate.    Condition at discharge: good  The results of significant diagnostics from this hospitalization (including imaging, microbiology, ancillary and laboratory) are listed below for reference.   Imaging Studies: CT Abdomen Pelvis Wo Contrast  Result Date: 02/23/2022 CLINICAL DATA:  Abdominal pain, weakness, incontinence. EXAM: CT ABDOMEN AND PELVIS WITHOUT CONTRAST TECHNIQUE: Multidetector CT imaging of the abdomen and pelvis was performed following the standard protocol without IV contrast. RADIATION DOSE REDUCTION: This exam was performed according to the departmental dose-optimization program which includes automated exposure control, adjustment of the mA and/or kV according to patient size and/or use of iterative reconstruction technique. COMPARISON:  CT abdomen pelvis dated 01/26/2015. FINDINGS: Lower chest: Mild bibasilar atelectasis/fibrosis is redemonstrated. Hepatobiliary: No focal liver abnormality is seen. No gallstones, gallbladder wall thickening, or biliary dilatation. Pancreas: Unremarkable. No pancreatic ductal dilatation or surrounding inflammatory changes. Spleen: Normal in size without focal abnormality. Adrenals/Urinary Tract: Adrenal glands are unremarkable. Bilateral benign renal cysts measure up to 2.4 cm on the right and 6.6 cm on the left. These are not significantly changed since prior exam and no further  imaging follow-up is recommended. No renal calculi or hydronephrosis on either side. Bladder is unremarkable. Stomach/Bowel: Stomach is within normal limits. The appendix is surgically absent. No evidence of bowel wall thickening, distention, or inflammatory changes. Vascular/Lymphatic: A bifurcated endograft is unchanged in position with the proximal portion just distal to the origin of the renal arteries and the distal portions in the common iliac arteries. The surrounding aneurysm sac has increased in size since 2016, now measuring 7.9 x 8.1 cm in the axial plane (series 2, image 35) and 10.4 cm in length (series 5, image 34). This measured 5.3 x 5.7 cm in the axial plane and 8.0 cm in length on 01/26/2015. There is no surrounding fat stranding or draping of the aneurysm sac to suggest impending rupture. Atherosclerotic calcifications are seen in the abdominal aorta proximal to the renal arteries. No lymphadenopathy is seen in the abdomen or pelvis. Reproductive: Prostate is unremarkable. Other: No abdominal wall hernia or abnormality. No abdominopelvic ascites. Musculoskeletal: Multiple chronic compression fractures are seen in the spine T10, T12, L1, L2, L3, L4, L5. These have progressed since 01/26/2015. No evidence of acute fracture. The patient is status post fixation of the right femoral neck. The hardware appears intact. Degenerative changes are seen in the spine. IMPRESSION: 1. Size of an infrarenal abdominal aortic aneurysm since 01/26/2015 without CT evidence of impending rupture. The bifurcated endograft appears unchanged in position. 2. Multiple chronic compression fractures in the thoracic and lumbar spine. 3. No acute process in the abdomen or pelvis. Aortic Atherosclerosis (ICD10-I70.0). Electronically Signed   By: Zerita Boers M.D.   On: 02/23/2022 20:08   CT Head Wo Contrast  Result Date: 02/23/2022 CLINICAL DATA:  Mental status change EXAM: CT HEAD WITHOUT CONTRAST TECHNIQUE: Contiguous  axial images were obtained from the base of the skull through the vertex without intravenous contrast. RADIATION DOSE REDUCTION: This exam was performed according to the departmental dose-optimization program which includes automated exposure control, adjustment of the mA and/or kV according to patient size and/or use of iterative reconstruction technique. COMPARISON:  CT head 09/22/2008 FINDINGS: Brain: Progression of generalized atrophy which is now moderate. This is most prominent the temporal lobe. Negative for hydrocephalus. Negative for acute infarct, hemorrhage, mass Vascular: Negative for hyperdense vessel Skull: Negative Sinuses/Orbits: Paranasal sinuses clear. Bilateral cataract extraction Other: None IMPRESSION: No acute abnormality. Progression of atrophy which is now moderate in degree. Electronically Signed   By: Franchot Gallo M.D.   On: 02/23/2022 19:59   DG Chest Port 1 View  Result Date: 02/23/2022 CLINICAL DATA:  Altered level of consciousness, incontinence, weakness EXAM: PORTABLE  CHEST 1 VIEW COMPARISON:  03/28/2019 FINDINGS: Single frontal view of the chest demonstrates postsurgical changes from CABG. Multi lead pacer/AICD again identified. Cardiac silhouette is unremarkable. No airspace disease, effusion, or pneumothorax. No acute bony abnormalities. IMPRESSION: 1. No acute intrathoracic process. Electronically Signed   By: Randa Ngo M.D.   On: 02/23/2022 19:08    Microbiology: Results for orders placed or performed during the hospital encounter of 02/23/22  Culture, blood (routine x 2)     Status: None (Preliminary result)   Collection Time: 02/23/22  6:42 PM   Specimen: BLOOD  Result Value Ref Range Status   Specimen Description BLOOD RIGHT ANTECUBITAL  Final   Special Requests   Final    BOTTLES DRAWN AEROBIC AND ANAEROBIC Blood Culture results may not be optimal due to an inadequate volume of blood received in culture bottles   Culture   Final    NO GROWTH 4  DAYS Performed at [facility], [address], [place], [place] [address]    Report Status PENDING  Incomplete  Culture, blood (routine x 2)     Status: None (Preliminary result)   Collection Time: 02/23/22  6:59 PM   Specimen: BLOOD  Result Value Ref Range Status   Specimen Description BLOOD BLOOD RIGHT FOREARM  Final   Special Requests   Final    BOTTLES DRAWN AEROBIC AND ANAEROBIC Blood Culture adequate volume   Culture   Final    NO GROWTH 4 DAYS Performed at [facility], [address] [place], [place] [address]    Report Status PENDING  Incomplete  SARS Coronavirus 2 by RT PCR (hospital order, performed in [place] hospital lab) *cepheid single result test* Anterior Nasal Swab     Status: None   Collection Time: 02/23/22 10:12 PM   Specimen: Anterior Nasal Swab  Result Value Ref Range Status   SARS Coronavirus 2 by RT PCR NEGATIVE NEGATIVE Final    Comment: (NOTE) SARS-CoV-2 target nucleic acids are NOT DETECTED.  The SARS-CoV-2 RNA is generally detectable in upper and lower respiratory specimens during the acute phase of infection. The lowest concentration of SARS-CoV-2 viral copies this assay can detect is 250 copies / mL. A negative result does not preclude SARS-CoV-2 infection and should not be used as the sole basis for treatment or other patient management decisions.  A negative result may occur with improper specimen collection / handling, submission of specimen other than nasopharyngeal swab, presence of viral mutation(s) within the areas targeted by this assay, and inadequate number of viral copies (<250 copies / mL). A negative result must be combined with clinical observations, patient history, and epidemiological information.  Fact Sheet for Patients:   https://www.patel.info/  Fact Sheet for Healthcare Providers: https://hall.com/  This test is not yet approved or  cleared by the [place] FDA and has been authorized for detection and/or diagnosis of SARS-CoV-2 by FDA under an Emergency Use Authorization (EUA).  This EUA will remain in effect (meaning this test can be used) for the duration of the COVID-19 declaration under Section 564(b)(1) of the Act, 21 U.S.C. section 360bbb-3(b)(1), unless the authorization is terminated or revoked sooner.  Performed at [facility], [place], [place] [address]   C Difficile Quick Screen w PCR reflex     Status: None   Collection Time: 02/24/22  9:31 PM   Specimen: STOOL  Result Value Ref Range Status   C Diff antigen NEGATIVE NEGATIVE Final  C Diff toxin NEGATIVE NEGATIVE Final   C Diff interpretation No C. difficile detected.  Final    Comment: Performed at [facility], [address] [place], [place] [address]    Labs: CBC: Recent Labs  Lab 02/23/22 1835 02/24/22 0715 02/25/22 0417 02/26/22 0636 02/27/22 0405  WBC 11.5* 8.5 8.4 7.3 8.5  NEUTROABS 8.4*  --   --   --   --   HGB 11.3* 10.2* 10.0* 10.0* 10.2*  HCT 34.0* 30.9* 31.1* 31.1* 31.3*  MCV 97.4 99.7 101.6* 102.0* 99.4  PLT 163 145* 150 136* 803*   Basic Metabolic Panel: Recent Labs  Lab 02/23/22 1835 02/24/22 0553 02/24/22 0836 02/25/22 0417 02/26/22 0636 02/27/22 0405 02/27/22 0922  NA 139 142  --  145 143 142  --   K 4.2 3.4*  --  4.1 4.5 3.6  --   CL 110 114*  --  118* 122* 119*  --   CO2 21* 22  --  22 21* 20*  --   GLUCOSE 100* 89  --  90 84 89  --   BUN 45* 41*  --  28* 24* 23  --   CREATININE 1.67* 1.59*  --  1.26* 1.27* 1.12  --   CALCIUM 9.2 8.6*  --  8.3* 8.3* 8.8*  --   MG  --   --  2.0  --   --   --  2.0   Liver Function Tests: Recent Labs  Lab 02/23/22 1835  AST 17  ALT 11  ALKPHOS 60  BILITOT 0.9  PROT 7.0  ALBUMIN 3.8   CBG: Recent Labs  Lab 02/26/22 1942 02/26/22 1954 02/27/22 0000 02/27/22 0415 02/27/22 0819  GLUCAP 110* 108* 99 91 70    Discharge time spent: greater  than 30 minutes.  Signed: Sharen Hones, MD [facility] 02/27/2022

## 2022-02-27 NOTE — Plan of Care (Signed)

## 2022-02-27 NOTE — Progress Notes (Signed)
D/c orders are in, IV taken out, AVS printed to be given to son.  Pt was waiting for DME to be delivered to room before d/c, [employer] was see on unit and notified nurse that son change DME delivery to home.  Son called and confirmed that it was delivered.  He was notified that the patient was able to be discharged, awaiting son to arrive at hospital to discharge patient.

## 2022-02-27 NOTE — Plan of Care (Signed)

## 2022-02-27 NOTE — Care Management Important Message (Signed)
Important Message  Patient Details  Name: Nathaniel Woods MRN: [id number] Date of Birth: 06/12/1928   Medicare Important Message Given:  Yes     Juliann Pulse A Deyanna Mctier 02/27/2022, 2:10 PM

## 2022-02-28 LAB — CULTURE, BLOOD (ROUTINE X 2)
Culture: NO GROWTH
Culture: NO GROWTH
Special Requests: ADEQUATE

## 2022-03-01 DIAGNOSIS — I714 Abdominal aortic aneurysm, without rupture, unspecified: Secondary | ICD-10-CM | POA: Diagnosis not present

## 2022-03-01 DIAGNOSIS — N1831 Chronic kidney disease, stage 3a: Secondary | ICD-10-CM | POA: Diagnosis not present

## 2022-03-01 DIAGNOSIS — I2510 Atherosclerotic heart disease of native coronary artery without angina pectoris: Secondary | ICD-10-CM | POA: Diagnosis not present

## 2022-03-01 DIAGNOSIS — E1122 Type 2 diabetes mellitus with diabetic chronic kidney disease: Secondary | ICD-10-CM | POA: Diagnosis not present

## 2022-03-01 DIAGNOSIS — Z7901 Long term (current) use of anticoagulants: Secondary | ICD-10-CM | POA: Diagnosis not present

## 2022-03-01 DIAGNOSIS — I480 Paroxysmal atrial fibrillation: Secondary | ICD-10-CM | POA: Diagnosis not present

## 2022-03-01 DIAGNOSIS — N390 Urinary tract infection, site not specified: Secondary | ICD-10-CM | POA: Diagnosis not present

## 2022-03-01 DIAGNOSIS — I130 Hypertensive heart and chronic kidney disease with heart failure and stage 1 through stage 4 chronic kidney disease, or unspecified chronic kidney disease: Secondary | ICD-10-CM | POA: Diagnosis not present

## 2022-03-01 DIAGNOSIS — I5022 Chronic systolic (congestive) heart failure: Secondary | ICD-10-CM | POA: Diagnosis not present

## 2022-03-01 DIAGNOSIS — L89151 Pressure ulcer of sacral region, stage 1: Secondary | ICD-10-CM | POA: Diagnosis not present

## 2022-03-07 DIAGNOSIS — G9341 Metabolic encephalopathy: Secondary | ICD-10-CM | POA: Diagnosis not present

## 2022-03-07 DIAGNOSIS — I480 Paroxysmal atrial fibrillation: Secondary | ICD-10-CM | POA: Diagnosis not present

## 2022-03-07 DIAGNOSIS — Z23 Encounter for immunization: Secondary | ICD-10-CM | POA: Diagnosis not present

## 2022-03-07 DIAGNOSIS — N1831 Chronic kidney disease, stage 3a: Secondary | ICD-10-CM | POA: Diagnosis not present

## 2022-03-07 DIAGNOSIS — I10 Essential (primary) hypertension: Secondary | ICD-10-CM | POA: Diagnosis not present

## 2022-03-07 DIAGNOSIS — I5022 Chronic systolic (congestive) heart failure: Secondary | ICD-10-CM | POA: Diagnosis not present

## 2022-03-11 DIAGNOSIS — N390 Urinary tract infection, site not specified: Secondary | ICD-10-CM | POA: Diagnosis not present

## 2022-03-11 DIAGNOSIS — L89151 Pressure ulcer of sacral region, stage 1: Secondary | ICD-10-CM | POA: Diagnosis not present

## 2022-03-11 DIAGNOSIS — I5022 Chronic systolic (congestive) heart failure: Secondary | ICD-10-CM | POA: Diagnosis not present

## 2022-03-11 DIAGNOSIS — E1122 Type 2 diabetes mellitus with diabetic chronic kidney disease: Secondary | ICD-10-CM | POA: Diagnosis not present

## 2022-03-11 DIAGNOSIS — N1831 Chronic kidney disease, stage 3a: Secondary | ICD-10-CM | POA: Diagnosis not present

## 2022-03-11 DIAGNOSIS — I480 Paroxysmal atrial fibrillation: Secondary | ICD-10-CM | POA: Diagnosis not present

## 2022-03-11 DIAGNOSIS — I2510 Atherosclerotic heart disease of native coronary artery without angina pectoris: Secondary | ICD-10-CM | POA: Diagnosis not present

## 2022-03-11 DIAGNOSIS — Z7901 Long term (current) use of anticoagulants: Secondary | ICD-10-CM | POA: Diagnosis not present

## 2022-03-11 DIAGNOSIS — I714 Abdominal aortic aneurysm, without rupture, unspecified: Secondary | ICD-10-CM | POA: Diagnosis not present

## 2022-03-11 DIAGNOSIS — I130 Hypertensive heart and chronic kidney disease with heart failure and stage 1 through stage 4 chronic kidney disease, or unspecified chronic kidney disease: Secondary | ICD-10-CM | POA: Diagnosis not present

## 2022-04-01 ENCOUNTER — Emergency Department
Admission: EM | Admit: 2022-04-01 | Discharge: 2022-04-01 | Disposition: A | Discharge status: Home / Self Care | Payer: PPO | Attending: Emergency Medicine | Admitting: Emergency Medicine

## 2022-04-01 ENCOUNTER — Other Ambulatory Visit: Payer: Self-pay

## 2022-04-01 ENCOUNTER — Emergency Department: Payer: PPO

## 2022-04-01 DIAGNOSIS — D72829 Elevated white blood cell count, unspecified: Secondary | ICD-10-CM | POA: Diagnosis not present

## 2022-04-01 DIAGNOSIS — N189 Chronic kidney disease, unspecified: Secondary | ICD-10-CM | POA: Diagnosis not present

## 2022-04-01 DIAGNOSIS — Z7901 Long term (current) use of anticoagulants: Secondary | ICD-10-CM | POA: Diagnosis not present

## 2022-04-01 DIAGNOSIS — Z043 Encounter for examination and observation following other accident: Secondary | ICD-10-CM | POA: Diagnosis not present

## 2022-04-01 DIAGNOSIS — W07XXXA Fall from chair, initial encounter: Secondary | ICD-10-CM | POA: Insufficient documentation

## 2022-04-01 DIAGNOSIS — R410 Disorientation, unspecified: Secondary | ICD-10-CM | POA: Diagnosis present

## 2022-04-01 DIAGNOSIS — Z950 Presence of cardiac pacemaker: Secondary | ICD-10-CM | POA: Insufficient documentation

## 2022-04-01 DIAGNOSIS — E1122 Type 2 diabetes mellitus with diabetic chronic kidney disease: Secondary | ICD-10-CM | POA: Insufficient documentation

## 2022-04-01 DIAGNOSIS — I10 Essential (primary) hypertension: Secondary | ICD-10-CM | POA: Diagnosis not present

## 2022-04-01 DIAGNOSIS — R001 Bradycardia, unspecified: Secondary | ICD-10-CM | POA: Diagnosis not present

## 2022-04-01 DIAGNOSIS — N179 Acute kidney failure, unspecified: Secondary | ICD-10-CM | POA: Diagnosis not present

## 2022-04-01 DIAGNOSIS — I2510 Atherosclerotic heart disease of native coronary artery without angina pectoris: Secondary | ICD-10-CM | POA: Diagnosis not present

## 2022-04-01 DIAGNOSIS — M4802 Spinal stenosis, cervical region: Secondary | ICD-10-CM | POA: Diagnosis not present

## 2022-04-01 DIAGNOSIS — I6782 Cerebral ischemia: Secondary | ICD-10-CM | POA: Diagnosis not present

## 2022-04-01 DIAGNOSIS — N3000 Acute cystitis without hematuria: Secondary | ICD-10-CM | POA: Insufficient documentation

## 2022-04-01 DIAGNOSIS — Z20822 Contact with and (suspected) exposure to covid-19: Secondary | ICD-10-CM | POA: Diagnosis not present

## 2022-04-01 DIAGNOSIS — W19XXXA Unspecified fall, initial encounter: Secondary | ICD-10-CM

## 2022-04-01 LAB — COMPREHENSIVE METABOLIC PANEL
ALT: 11 U/L (ref 0–44)
AST: 21 U/L (ref 15–41)
Albumin: 4.1 g/dL (ref 3.5–5.0)
Alkaline Phosphatase: 60 U/L (ref 38–126)
Anion gap: 5 (ref 5–15)
BUN: 34 mg/dL — ABNORMAL HIGH (ref 8–23)
CO2: 22 mmol/L (ref 22–32)
Calcium: 9.1 mg/dL (ref 8.9–10.3)
Chloride: 112 mmol/L — ABNORMAL HIGH (ref 98–111)
Creatinine, Ser: 1.42 mg/dL — ABNORMAL HIGH (ref 0.61–1.24)
GFR, Estimated: 47 mL/min — ABNORMAL LOW (ref >60)
Glucose, Bld: 114 mg/dL — ABNORMAL HIGH (ref 70–99)
Potassium: 4.3 mmol/L (ref 3.5–5.1)
Sodium: 139 mmol/L (ref 135–145)
Total Bilirubin: 0.7 mg/dL (ref 0.3–1.2)
Total Protein: 7 g/dL (ref 6.5–8.1)

## 2022-04-01 LAB — TROPONIN I (HIGH SENSITIVITY)
Troponin I (High Sensitivity): 20 ng/L — ABNORMAL HIGH (ref <18)
Troponin I (High Sensitivity): 20 ng/L — ABNORMAL HIGH (ref <18)

## 2022-04-01 LAB — URINALYSIS, ROUTINE W REFLEX MICROSCOPIC
Bacteria, UA: NONE SEEN
Bilirubin Urine: NEGATIVE
Glucose, UA: NEGATIVE mg/dL
Hgb urine dipstick: NEGATIVE
Ketones, ur: NEGATIVE mg/dL
Nitrite: NEGATIVE
Protein, ur: NEGATIVE mg/dL
Specific Gravity, Urine: 1.02 (ref 1.005–1.030)
pH: 5 (ref 5.0–8.0)

## 2022-04-01 LAB — CBC WITH DIFFERENTIAL/PLATELET
Abs Immature Granulocytes: 0.06 10*3/uL (ref 0.00–0.07)
Basophils Absolute: 0.1 10*3/uL (ref 0.0–0.1)
Basophils Relative: 1 %
Eosinophils Absolute: 0.1 10*3/uL (ref 0.0–0.5)
Eosinophils Relative: 1 %
HCT: 35.4 % — ABNORMAL LOW (ref 39.0–52.0)
Hemoglobin: 11.3 g/dL — ABNORMAL LOW (ref 13.0–17.0)
Immature Granulocytes: 1 %
Lymphocytes Relative: 11 %
Lymphs Abs: 1.3 10*3/uL (ref 0.7–4.0)
MCH: 32.6 pg (ref 26.0–34.0)
MCHC: 31.9 g/dL (ref 30.0–36.0)
MCV: 102 fL — ABNORMAL HIGH (ref 80.0–100.0)
Monocytes Absolute: 0.9 10*3/uL (ref 0.1–1.0)
Monocytes Relative: 8 %
Neutro Abs: 9.6 10*3/uL — ABNORMAL HIGH (ref 1.7–7.7)
Neutrophils Relative %: 78 %
Platelets: 149 10*3/uL — ABNORMAL LOW (ref 150–400)
RBC: 3.47 MIL/uL — ABNORMAL LOW (ref 4.22–5.81)
RDW: 14.3 % (ref 11.5–15.5)
WBC: 12.1 10*3/uL — ABNORMAL HIGH (ref 4.0–10.5)
nRBC: 0 % (ref 0.0–0.2)

## 2022-04-01 LAB — SARS CORONAVIRUS 2 BY RT PCR: SARS Coronavirus 2 by RT PCR: NEGATIVE

## 2022-04-01 MED ORDER — CEPHALEXIN 500 MG PO CAPS: 500.0000 mg | ORAL_CAPSULE | Freq: Two times a day (BID) | ORAL | 0 refills | Status: AC

## 2022-04-01 MED ORDER — LACTATED RINGERS IV BOLUS
500.0000 mL | Freq: Once | INTRAVENOUS | Status: AC
  Administered 2022-04-01: 500 mL via INTRAVENOUS

## 2022-04-01 NOTE — ED Triage Notes (Signed)
Patient arrived via EMS from home. State patient was in [place] chair and slid out of chair onto carpeted floor. Patient denies hitting head, denies LOC, denies pain. Family also reports they believe patient has "the start of a UTI" Patient alert, conversing appropriately, resp even, unlabored on RA.

## 2022-04-01 NOTE — Discharge Instructions (Addendum)
We are starting him on antibiotics to ensure that there is not a urinary infection however his culture is in process.  He can return to the ER if he develops fevers worsening symptoms or any other concerns.  He was slightly dehydrated given some fluids.  Return to the ER for worsening symptoms or any other concerns   IMPRESSION: 1. No acute traumatic injury identified in the cervical spine. 2. Cervical spine degeneration. Bulky calcified carotid atherosclerosis. 3. Head CT reported separately. IMPRESSION: 1. No acute intracranial abnormality. 2. Atrophy with chronic small vessel ischemic disease.

## 2022-04-01 NOTE — ED Provider Notes (Signed)
[facility] Provider Note    Event Date/Time   First MD Initiated Contact with Patient 04/01/22 [phone]     (approximate)   History   Fall   HPI  Nathaniel Woods is a 88 y.o. male with history of coronary disease, paroxysmal A-fib on Eliquis, CKD, AAA status post endovascular repair, diabetes, stroke who comes in with concerns for a fall.  I reviewed patient's hospital admission from 02/27/2022 where he was admitted for confusion thought to be secondary to UTI  Patient reports that he is not sure why he is here.  He states that he thinks he just came in for a checkup.  He denies any pain.  Denies any chest pain, shortness of breath, abdominal pain or any other concerns.  I was able to call the patient's son he reports that he had a fall where he slid out of his recliner chair.  It was unwitnessed unclear if he hit his head or not.  They did report that over the past few weeks he has been having some increased confusion at nighttime but they were not sure if was just related to sundowning or could be another UTI and/or dehydration so they wanted to get this looked at as well.  They deny any cough, fevers or other concerns.  Physical Exam   Triage Vital Signs: ED Triage Vitals  Enc Vitals Group     BP 04/01/22 0653 139/65     Pulse Rate 04/01/22 0653 61     Resp 04/01/22 0653 18     Temp 04/01/22 0653 98.3 F (36.8 C)     Temp Source 04/01/22 0653 Oral     SpO2 04/01/22 0653 98 %     Weight 04/01/22 0654 171 lb 15.3 oz (78 kg)     Height 04/01/22 0654 '6\' 3"'$  (1.905 m)     Head Circumference --      Peak Flow --      Pain Score 04/01/22 0654 0     Pain Loc --      Pain Edu? --      Excl. in [place]? --     Most recent vital signs: Vitals:   04/01/22 0653  BP: 139/65  Pulse: 61  Resp: 18  Temp: 98.3 F (36.8 C)  SpO2: 98%     General: Awake, no distress.  CV:  Good peripheral perfusion.  Resp:  Normal effort.  Abd:  No distention.  Other:  No  chest wall pain.  No abdominal tenderness.  Able to lift both legs up off the bed.  No new CTL spine tenderness. Pacemaker noted left chest wall  ED Results / Procedures / Treatments   Labs (all labs ordered are listed, but only abnormal results are displayed) Labs Reviewed  URINE CULTURE  SARS CORONAVIRUS 2 BY RT PCR  URINALYSIS, ROUTINE W REFLEX MICROSCOPIC  CBC WITH DIFFERENTIAL/PLATELET  COMPREHENSIVE METABOLIC PANEL  TROPONIN I (HIGH SENSITIVITY)     EKG  My interpretation of EKG:  Atrial fibrillation with a rate of 65 without any ST elevation, T wave version aVL and V2.  Patient is a left bundle branch block.  RADIOLOGY I have reviewed the CT personally interpreted no evidence of intracranial hemorrhage  PROCEDURES:  Critical Care performed: No  .1-3 Lead EKG Interpretation  Performed by: Vanessa [place], MD Authorized by: Vanessa , MD     Interpretation: normal     ECG rate:  60   ECG  rate assessment: normal     Rhythm: sinus rhythm     Ectopy: none     Conduction: normal      MEDICATIONS ORDERED IN ED: Medications  lactated ringers bolus 500 mL (0 mLs Intravenous Stopped 04/01/22 0837)     IMPRESSION / MDM / ASSESSMENT AND PLAN / ED COURSE  I reviewed the triage vital signs and the nursing notes.   Patient's presentation is most consistent with acute presentation with potential threat to life or bodily function.   Differential includes intracranial hemorrhage, cervical fracture, rib fracture.  Patient is able to lift both legs off the bed.  Unlikely any leg fracture Labs ordered evaluate for any dehydration, UTI.  Chest x-ray negative.  CT head and neck negative--incidental findings provided copy of report for family  UA does have trace leukocytes with 6-10 WBCs.  Difficult to tell patient has any symptoms but according to family he does seem more weak and have a little bit more confusion at nighttime so they feel like this is similar to when he  had a UTI previously we will send for urine culture and start antibiotics.  Patient does have slightly elevated white count but does not meet any other sepsis criteria.  Considered admission but given son is at bedside reports he is acting at her his baseline self and has been able to get up and ambulate see for comfortable taking him home at this time.  COVID-negative.  White count slightly elevated.  Hemoglobin is stable.  CMP shows slight elevation of creatinine from baseline.  Patient given 500 cc of fluid.  Troponins are negative x2 and he denied any chest pain.  EKG was reading as a STEMI but he was a left bundle branch block and patient does have a pacer this is not a STEMI.  The patient is on the cardiac monitor to evaluate for evidence of arrhythmia and/or significant heart rate changes.      FINAL CLINICAL IMPRESSION(S) / ED DIAGNOSES   Final diagnoses:  Acute cystitis without hematuria  AKI (acute kidney injury) ([place])     Rx / DC Orders   ED Discharge Orders          Ordered    cephALEXin (KEFLEX) 500 MG capsule  2 times daily        04/01/22 1039             Note:  This document was prepared using Dragon voice recognition software and may include unintentional dictation errors.   Vanessa [place], MD 04/01/22 1040

## 2022-04-01 NOTE — ED Notes (Signed)
Patient at CT at this time. 

## 2022-04-02 LAB — URINE CULTURE: Culture: NO GROWTH

## 2022-04-07 ENCOUNTER — Encounter (INDEPENDENT_AMBULATORY_CARE_PROVIDER_SITE_OTHER): Payer: Self-pay

## 2022-04-07 DIAGNOSIS — I130 Hypertensive heart and chronic kidney disease with heart failure and stage 1 through stage 4 chronic kidney disease, or unspecified chronic kidney disease: Secondary | ICD-10-CM | POA: Diagnosis not present

## 2022-04-07 DIAGNOSIS — Z7901 Long term (current) use of anticoagulants: Secondary | ICD-10-CM | POA: Diagnosis not present

## 2022-04-07 DIAGNOSIS — I480 Paroxysmal atrial fibrillation: Secondary | ICD-10-CM | POA: Diagnosis not present

## 2022-04-07 DIAGNOSIS — N1831 Chronic kidney disease, stage 3a: Secondary | ICD-10-CM | POA: Diagnosis not present

## 2022-04-07 DIAGNOSIS — I5022 Chronic systolic (congestive) heart failure: Secondary | ICD-10-CM | POA: Diagnosis not present

## 2022-04-07 DIAGNOSIS — E1122 Type 2 diabetes mellitus with diabetic chronic kidney disease: Secondary | ICD-10-CM | POA: Diagnosis not present

## 2022-04-07 DIAGNOSIS — N390 Urinary tract infection, site not specified: Secondary | ICD-10-CM | POA: Diagnosis not present

## 2022-04-09 ENCOUNTER — Telehealth: Payer: Self-pay

## 2022-04-09 NOTE — Telephone Encounter (Signed)
        Patient  visited [facility] on 10/24    Telephone encounter attempt :  1st  Unable to leave a message    [place], [facility]  [phone] [address], [place], [place] [address] Phone: [phone] Email: [email or web address]

## 2022-04-10 DIAGNOSIS — Z7901 Long term (current) use of anticoagulants: Secondary | ICD-10-CM | POA: Diagnosis not present

## 2022-04-10 DIAGNOSIS — I714 Abdominal aortic aneurysm, without rupture, unspecified: Secondary | ICD-10-CM | POA: Diagnosis not present

## 2022-04-10 DIAGNOSIS — I2510 Atherosclerotic heart disease of native coronary artery without angina pectoris: Secondary | ICD-10-CM | POA: Diagnosis not present

## 2022-04-10 DIAGNOSIS — I130 Hypertensive heart and chronic kidney disease with heart failure and stage 1 through stage 4 chronic kidney disease, or unspecified chronic kidney disease: Secondary | ICD-10-CM | POA: Diagnosis not present

## 2022-04-10 DIAGNOSIS — M1990 Unspecified osteoarthritis, unspecified site: Secondary | ICD-10-CM | POA: Diagnosis not present

## 2022-04-10 DIAGNOSIS — I5022 Chronic systolic (congestive) heart failure: Secondary | ICD-10-CM | POA: Diagnosis not present

## 2022-04-10 DIAGNOSIS — N1831 Chronic kidney disease, stage 3a: Secondary | ICD-10-CM | POA: Diagnosis not present

## 2022-04-10 DIAGNOSIS — I480 Paroxysmal atrial fibrillation: Secondary | ICD-10-CM | POA: Diagnosis not present

## 2022-04-10 DIAGNOSIS — E785 Hyperlipidemia, unspecified: Secondary | ICD-10-CM | POA: Diagnosis not present

## 2022-04-10 DIAGNOSIS — E1122 Type 2 diabetes mellitus with diabetic chronic kidney disease: Secondary | ICD-10-CM | POA: Diagnosis not present

## 2022-04-11 ENCOUNTER — Telehealth: Payer: Self-pay

## 2022-04-11 NOTE — Telephone Encounter (Signed)
     Patient  visit on 10/24 at [facility]   Have you been able to follow up with your primary care physician? YES  The patient was or was not able to obtain any needed medicine or equipment. YES  Are there diet recommendations that you are having difficulty following? NA  Patient expresses understanding of discharge instructions and education provided has no other needs at this time. [place], [facility], Care Management  [phone] [address], [place], [place] [address] Phone: [phone] Email: [email or web address]

## 2022-04-15 DIAGNOSIS — Z95810 Presence of automatic (implantable) cardiac defibrillator: Secondary | ICD-10-CM | POA: Diagnosis not present

## 2022-05-06 DIAGNOSIS — R4182 Altered mental status, unspecified: Secondary | ICD-10-CM | POA: Diagnosis not present

## 2022-05-06 DIAGNOSIS — R8290 Unspecified abnormal findings in urine: Secondary | ICD-10-CM | POA: Diagnosis not present

## 2022-05-06 DIAGNOSIS — R300 Dysuria: Secondary | ICD-10-CM | POA: Diagnosis not present

## 2022-05-08 ENCOUNTER — Emergency Department: Payer: PPO

## 2022-05-08 ENCOUNTER — Other Ambulatory Visit: Payer: Self-pay

## 2022-05-08 ENCOUNTER — Inpatient Hospital Stay
Admission: EM | Admit: 2022-05-08 | Discharge: 2022-05-11 | DRG: 641 | Disposition: A | Discharge status: Home Health | Payer: PPO | Attending: Internal Medicine | Admitting: Internal Medicine

## 2022-05-08 DIAGNOSIS — Z79899 Other long term (current) drug therapy: Secondary | ICD-10-CM

## 2022-05-08 DIAGNOSIS — Z95810 Presence of automatic (implantable) cardiac defibrillator: Secondary | ICD-10-CM | POA: Diagnosis not present

## 2022-05-08 DIAGNOSIS — R296 Repeated falls: Secondary | ICD-10-CM | POA: Diagnosis not present

## 2022-05-08 DIAGNOSIS — M47812 Spondylosis without myelopathy or radiculopathy, cervical region: Secondary | ICD-10-CM | POA: Diagnosis not present

## 2022-05-08 DIAGNOSIS — Z8679 Personal history of other diseases of the circulatory system: Secondary | ICD-10-CM

## 2022-05-08 DIAGNOSIS — Z91030 Bee allergy status: Secondary | ICD-10-CM

## 2022-05-08 DIAGNOSIS — E1122 Type 2 diabetes mellitus with diabetic chronic kidney disease: Secondary | ICD-10-CM | POA: Diagnosis present

## 2022-05-08 DIAGNOSIS — I700 Atherosclerosis of aorta: Secondary | ICD-10-CM | POA: Diagnosis not present

## 2022-05-08 DIAGNOSIS — I252 Old myocardial infarction: Secondary | ICD-10-CM

## 2022-05-08 DIAGNOSIS — R0689 Other abnormalities of breathing: Secondary | ICD-10-CM | POA: Diagnosis not present

## 2022-05-08 DIAGNOSIS — R531 Weakness: Secondary | ICD-10-CM | POA: Diagnosis not present

## 2022-05-08 DIAGNOSIS — Z87891 Personal history of nicotine dependence: Secondary | ICD-10-CM | POA: Diagnosis not present

## 2022-05-08 DIAGNOSIS — J849 Interstitial pulmonary disease, unspecified: Secondary | ICD-10-CM | POA: Diagnosis not present

## 2022-05-08 DIAGNOSIS — E861 Hypovolemia: Secondary | ICD-10-CM | POA: Diagnosis present

## 2022-05-08 DIAGNOSIS — I480 Paroxysmal atrial fibrillation: Secondary | ICD-10-CM | POA: Diagnosis present

## 2022-05-08 DIAGNOSIS — N400 Enlarged prostate without lower urinary tract symptoms: Secondary | ICD-10-CM | POA: Diagnosis present

## 2022-05-08 DIAGNOSIS — R159 Full incontinence of feces: Secondary | ICD-10-CM | POA: Diagnosis present

## 2022-05-08 DIAGNOSIS — Z9842 Cataract extraction status, left eye: Secondary | ICD-10-CM

## 2022-05-08 DIAGNOSIS — Z7901 Long term (current) use of anticoagulants: Secondary | ICD-10-CM | POA: Diagnosis not present

## 2022-05-08 DIAGNOSIS — M47814 Spondylosis without myelopathy or radiculopathy, thoracic region: Secondary | ICD-10-CM | POA: Diagnosis not present

## 2022-05-08 DIAGNOSIS — M5136 Other intervertebral disc degeneration, lumbar region: Secondary | ICD-10-CM | POA: Diagnosis not present

## 2022-05-08 DIAGNOSIS — M549 Dorsalgia, unspecified: Secondary | ICD-10-CM | POA: Diagnosis not present

## 2022-05-08 DIAGNOSIS — S2243XA Multiple fractures of ribs, bilateral, initial encounter for closed fracture: Secondary | ICD-10-CM | POA: Diagnosis not present

## 2022-05-08 DIAGNOSIS — M25561 Pain in right knee: Secondary | ICD-10-CM | POA: Diagnosis not present

## 2022-05-08 DIAGNOSIS — I2510 Atherosclerotic heart disease of native coronary artery without angina pectoris: Secondary | ICD-10-CM | POA: Diagnosis not present

## 2022-05-08 DIAGNOSIS — Z91041 Radiographic dye allergy status: Secondary | ICD-10-CM | POA: Diagnosis not present

## 2022-05-08 DIAGNOSIS — Z961 Presence of intraocular lens: Secondary | ICD-10-CM | POA: Diagnosis present

## 2022-05-08 DIAGNOSIS — Z888 Allergy status to other drugs, medicaments and biological substances status: Secondary | ICD-10-CM

## 2022-05-08 DIAGNOSIS — R32 Unspecified urinary incontinence: Secondary | ICD-10-CM | POA: Diagnosis present

## 2022-05-08 DIAGNOSIS — E871 Hypo-osmolality and hyponatremia: Secondary | ICD-10-CM | POA: Diagnosis not present

## 2022-05-08 DIAGNOSIS — E860 Dehydration: Principal | ICD-10-CM | POA: Diagnosis present

## 2022-05-08 DIAGNOSIS — Z974 Presence of external hearing-aid: Secondary | ICD-10-CM

## 2022-05-08 DIAGNOSIS — M545 Low back pain, unspecified: Secondary | ICD-10-CM | POA: Diagnosis present

## 2022-05-08 DIAGNOSIS — I10 Essential (primary) hypertension: Secondary | ICD-10-CM | POA: Diagnosis present

## 2022-05-08 DIAGNOSIS — R410 Disorientation, unspecified: Secondary | ICD-10-CM | POA: Diagnosis not present

## 2022-05-08 DIAGNOSIS — J45909 Unspecified asthma, uncomplicated: Secondary | ICD-10-CM | POA: Diagnosis not present

## 2022-05-08 DIAGNOSIS — Z9841 Cataract extraction status, right eye: Secondary | ICD-10-CM

## 2022-05-08 DIAGNOSIS — T68XXXA Hypothermia, initial encounter: Secondary | ICD-10-CM | POA: Diagnosis not present

## 2022-05-08 DIAGNOSIS — R4189 Other symptoms and signs involving cognitive functions and awareness: Secondary | ICD-10-CM | POA: Diagnosis present

## 2022-05-08 DIAGNOSIS — J439 Emphysema, unspecified: Secondary | ICD-10-CM | POA: Diagnosis not present

## 2022-05-08 DIAGNOSIS — Z85828 Personal history of other malignant neoplasm of skin: Secondary | ICD-10-CM

## 2022-05-08 DIAGNOSIS — N1831 Chronic kidney disease, stage 3a: Secondary | ICD-10-CM | POA: Diagnosis not present

## 2022-05-08 DIAGNOSIS — E785 Hyperlipidemia, unspecified: Secondary | ICD-10-CM | POA: Diagnosis present

## 2022-05-08 DIAGNOSIS — F32A Depression, unspecified: Secondary | ICD-10-CM | POA: Diagnosis not present

## 2022-05-08 DIAGNOSIS — R0902 Hypoxemia: Secondary | ICD-10-CM | POA: Diagnosis not present

## 2022-05-08 DIAGNOSIS — Z8673 Personal history of transient ischemic attack (TIA), and cerebral infarction without residual deficits: Secondary | ICD-10-CM

## 2022-05-08 DIAGNOSIS — Z951 Presence of aortocoronary bypass graft: Secondary | ICD-10-CM

## 2022-05-08 DIAGNOSIS — S3991XA Unspecified injury of abdomen, initial encounter: Secondary | ICD-10-CM | POA: Diagnosis not present

## 2022-05-08 DIAGNOSIS — N179 Acute kidney failure, unspecified: Secondary | ICD-10-CM | POA: Diagnosis not present

## 2022-05-08 DIAGNOSIS — S0990XA Unspecified injury of head, initial encounter: Secondary | ICD-10-CM | POA: Diagnosis not present

## 2022-05-08 DIAGNOSIS — K5730 Diverticulosis of large intestine without perforation or abscess without bleeding: Secondary | ICD-10-CM | POA: Diagnosis not present

## 2022-05-08 DIAGNOSIS — N401 Enlarged prostate with lower urinary tract symptoms: Secondary | ICD-10-CM | POA: Diagnosis not present

## 2022-05-08 DIAGNOSIS — I5022 Chronic systolic (congestive) heart failure: Secondary | ICD-10-CM | POA: Diagnosis not present

## 2022-05-08 DIAGNOSIS — R351 Nocturia: Secondary | ICD-10-CM | POA: Diagnosis present

## 2022-05-08 DIAGNOSIS — I6523 Occlusion and stenosis of bilateral carotid arteries: Secondary | ICD-10-CM | POA: Diagnosis not present

## 2022-05-08 DIAGNOSIS — I130 Hypertensive heart and chronic kidney disease with heart failure and stage 1 through stage 4 chronic kidney disease, or unspecified chronic kidney disease: Secondary | ICD-10-CM | POA: Diagnosis present

## 2022-05-08 DIAGNOSIS — E119 Type 2 diabetes mellitus without complications: Secondary | ICD-10-CM

## 2022-05-08 DIAGNOSIS — N281 Cyst of kidney, acquired: Secondary | ICD-10-CM | POA: Diagnosis not present

## 2022-05-08 DIAGNOSIS — M419 Scoliosis, unspecified: Secondary | ICD-10-CM | POA: Diagnosis not present

## 2022-05-08 DIAGNOSIS — M4856XD Collapsed vertebra, not elsewhere classified, lumbar region, subsequent encounter for fracture with routine healing: Secondary | ICD-10-CM | POA: Diagnosis present

## 2022-05-08 DIAGNOSIS — Q7649 Other congenital malformations of spine, not associated with scoliosis: Secondary | ICD-10-CM | POA: Diagnosis not present

## 2022-05-08 HISTORY — DX: Acute kidney failure, unspecified: N17.9

## 2022-05-08 LAB — URINALYSIS, ROUTINE W REFLEX MICROSCOPIC
Bilirubin Urine: NEGATIVE
Glucose, UA: NEGATIVE mg/dL
Hgb urine dipstick: NEGATIVE
Ketones, ur: NEGATIVE mg/dL
Nitrite: NEGATIVE
Protein, ur: NEGATIVE mg/dL
Specific Gravity, Urine: 1.015 (ref 1.005–1.030)
pH: 5 (ref 5.0–8.0)

## 2022-05-08 LAB — CBC WITH DIFFERENTIAL/PLATELET
Abs Immature Granulocytes: 0.04 10*3/uL (ref 0.00–0.07)
Basophils Absolute: 0.1 10*3/uL (ref 0.0–0.1)
Basophils Relative: 1 %
Eosinophils Absolute: 0.4 10*3/uL (ref 0.0–0.5)
Eosinophils Relative: 5 %
HCT: 30.8 % — ABNORMAL LOW (ref 39.0–52.0)
Hemoglobin: 10.2 g/dL — ABNORMAL LOW (ref 13.0–17.0)
Immature Granulocytes: 1 %
Lymphocytes Relative: 11 %
Lymphs Abs: 1 10*3/uL (ref 0.7–4.0)
MCH: 33.8 pg (ref 26.0–34.0)
MCHC: 33.1 g/dL (ref 30.0–36.0)
MCV: 102 fL — ABNORMAL HIGH (ref 80.0–100.0)
Monocytes Absolute: 0.9 10*3/uL (ref 0.1–1.0)
Monocytes Relative: 10 %
Neutro Abs: 6.3 10*3/uL (ref 1.7–7.7)
Neutrophils Relative %: 72 %
Platelets: 131 10*3/uL — ABNORMAL LOW (ref 150–400)
RBC: 3.02 MIL/uL — ABNORMAL LOW (ref 4.22–5.81)
RDW: 14 % (ref 11.5–15.5)
WBC: 8.7 10*3/uL (ref 4.0–10.5)
nRBC: 0 % (ref 0.0–0.2)

## 2022-05-08 LAB — COMPREHENSIVE METABOLIC PANEL
ALT: 12 U/L (ref 0–44)
AST: 20 U/L (ref 15–41)
Albumin: 3.4 g/dL — ABNORMAL LOW (ref 3.5–5.0)
Alkaline Phosphatase: 52 U/L (ref 38–126)
Anion gap: 3 — ABNORMAL LOW (ref 5–15)
BUN: 27 mg/dL — ABNORMAL HIGH (ref 8–23)
CO2: 22 mmol/L (ref 22–32)
Calcium: 8.3 mg/dL — ABNORMAL LOW (ref 8.9–10.3)
Chloride: 107 mmol/L (ref 98–111)
Creatinine, Ser: 1.32 mg/dL — ABNORMAL HIGH (ref 0.61–1.24)
GFR, Estimated: 51 mL/min — ABNORMAL LOW (ref >60)
Glucose, Bld: 124 mg/dL — ABNORMAL HIGH (ref 70–99)
Potassium: 4.1 mmol/L (ref 3.5–5.1)
Sodium: 132 mmol/L — ABNORMAL LOW (ref 135–145)
Total Bilirubin: 0.8 mg/dL (ref 0.3–1.2)
Total Protein: 6 g/dL — ABNORMAL LOW (ref 6.5–8.1)

## 2022-05-08 LAB — TROPONIN I (HIGH SENSITIVITY)
Troponin I (High Sensitivity): 44 ng/L — ABNORMAL HIGH (ref <18)
Troponin I (High Sensitivity): 32 ng/L — ABNORMAL HIGH (ref <18)

## 2022-05-08 LAB — LIPASE, BLOOD: Lipase: 59 U/L — ABNORMAL HIGH (ref 11–51)

## 2022-05-08 MED ORDER — TRAZODONE HCL 50 MG PO TABS: 25.0000 mg | ORAL_TABLET | Freq: Every evening | ORAL | Status: DC | PRN

## 2022-05-08 MED ORDER — LATANOPROST 0.005 % OP SOLN
1.0000 [drp] | Freq: Every day | OPHTHALMIC | Status: DC
  Administered 2022-05-09 – 2022-05-10 (×2): 1 [drp] via OPHTHALMIC
  Filled 2022-05-08: qty 2.5

## 2022-05-08 MED ORDER — SODIUM CHLORIDE 0.9 % IV SOLN: INTRAVENOUS | Status: DC

## 2022-05-08 MED ORDER — APIXABAN 5 MG PO TABS
5.0000 mg | ORAL_TABLET | Freq: Two times a day (BID) | ORAL | Status: DC
  Administered 2022-05-08 – 2022-05-11 (×6): 5 mg via ORAL
  Filled 2022-05-08 (×6): qty 1

## 2022-05-08 MED ORDER — ACETAMINOPHEN 325 MG PO TABS: 650.0000 mg | ORAL_TABLET | Freq: Four times a day (QID) | ORAL | Status: DC | PRN

## 2022-05-08 MED ORDER — ONDANSETRON HCL 4 MG/2ML IJ SOLN: 4.0000 mg | Freq: Four times a day (QID) | INTRAMUSCULAR | Status: DC | PRN

## 2022-05-08 MED ORDER — CITALOPRAM HYDROBROMIDE 10 MG PO TABS
20.0000 mg | ORAL_TABLET | Freq: Every evening | ORAL | Status: DC
  Administered 2022-05-09 – 2022-05-10 (×2): 20 mg via ORAL
  Filled 2022-05-08 (×3): qty 2

## 2022-05-08 MED ORDER — OYSTER SHELL CALCIUM/D3 500-5 MG-MCG PO TABS
1.0000 | ORAL_TABLET | Freq: Every evening | ORAL | Status: DC
  Administered 2022-05-09 – 2022-05-10 (×2): 1 via ORAL
  Filled 2022-05-08 (×2): qty 1

## 2022-05-08 MED ORDER — METOPROLOL SUCCINATE ER 25 MG PO TB24
25.0000 mg | ORAL_TABLET | Freq: Every day | ORAL | Status: DC
  Administered 2022-05-09 – 2022-05-11 (×3): 25 mg via ORAL
  Filled 2022-05-08 (×3): qty 1

## 2022-05-08 MED ORDER — MIRTAZAPINE 15 MG PO TABS: 15.0000 mg | ORAL_TABLET | Freq: Every day | ORAL | Status: DC

## 2022-05-08 MED ORDER — ACETAMINOPHEN 650 MG RE SUPP: 650.0000 mg | Freq: Four times a day (QID) | RECTAL | Status: DC | PRN

## 2022-05-08 MED ORDER — ROSUVASTATIN CALCIUM 5 MG PO TABS: 5.0000 mg | ORAL_TABLET | ORAL | Status: DC

## 2022-05-08 MED ORDER — TRAMADOL HCL 50 MG PO TABS
50.0000 mg | ORAL_TABLET | Freq: Once | ORAL | Status: AC
  Administered 2022-05-09: 50 mg via ORAL
  Filled 2022-05-08: qty 1

## 2022-05-08 MED ORDER — ONDANSETRON HCL 4 MG PO TABS: 4.0000 mg | ORAL_TABLET | Freq: Four times a day (QID) | ORAL | Status: DC | PRN

## 2022-05-08 MED ORDER — MONTELUKAST SODIUM 10 MG PO TABS
10.0000 mg | ORAL_TABLET | Freq: Every day | ORAL | Status: DC
  Administered 2022-05-08 – 2022-05-10 (×3): 10 mg via ORAL
  Filled 2022-05-08 (×3): qty 1

## 2022-05-08 MED ORDER — COQ10 200 MG PO CAPS: 200.0000 mg | ORAL_CAPSULE | Freq: Every evening | ORAL | Status: DC

## 2022-05-08 MED ORDER — FINASTERIDE 5 MG PO TABS
5.0000 mg | ORAL_TABLET | Freq: Every evening | ORAL | Status: DC
  Administered 2022-05-09 – 2022-05-10 (×2): 5 mg via ORAL
  Filled 2022-05-08 (×2): qty 1

## 2022-05-08 MED ORDER — MAGNESIUM HYDROXIDE 400 MG/5ML PO SUSP: 30.0000 mL | Freq: Every day | ORAL | Status: DC | PRN

## 2022-05-08 MED ORDER — LACTATED RINGERS IV BOLUS
1000.0000 mL | Freq: Once | INTRAVENOUS | Status: AC
  Administered 2022-05-08: 1000 mL via INTRAVENOUS

## 2022-05-08 NOTE — Assessment & Plan Note (Addendum)
Sliding scale Novolog 

## 2022-05-08 NOTE — Assessment & Plan Note (Addendum)
Acute on chronic mid low back pain and generalized weakness.  Radiographic studies have been unremarkable for acute injury. --PT evaluation --Fall precautions --Pain control for back pain, minimize sedating meds as much as possible --Family will decline SNF, citing he "would not survive it", agreeable to [facility] for rehab.  They are arranging full time caregiver support.

## 2022-05-08 NOTE — Assessment & Plan Note (Signed)
-   We will continue as Proscar

## 2022-05-08 NOTE — Assessment & Plan Note (Signed)
-   We will continue his Toprol-XL.

## 2022-05-08 NOTE — Assessment & Plan Note (Addendum)
Rate controlled. Continue Eliquis and metoprolol.

## 2022-05-08 NOTE — ED Triage Notes (Signed)
Pt arrives via EMS for several falls over the last few days and fecal/urinary incontinence x months. Pt alert and oriented at this time however does not remember falls, unaware of LOC. Purple and excoriation noted to buttocks and in gluteal fold. Seen by MD. Pt cleansed and placed in brief. Pt c/o lower back and right knee pain. 50 mcg Fentanyl given by EMS along with 1L [place].

## 2022-05-08 NOTE — ED Provider Notes (Signed)
[facility] Provider Note    Event Date/Time   First MD Initiated Contact with Patient 05/08/22 1553     (approximate)   History   Chief Complaint Fall   HPI  [name] is a 88 y.o. male with past medical history of hypertension, diabetes, CAD status post CABG, atrial fibrillation on Eliquis, AAA status post endovascular repair, stroke, CKD, and CHF who presents to the ED for fall.  Per EMS, patient had 2 falls yesterday at home, when son went to check on him today he was having difficulty mobilizing and seem much weaker than usual.  Patient reports that he slid out of his recliner, but is not sure whether he hit his head or lost consciousness.  He complains of severe pain whenever he tries to move, pain primarily located in his lower back.  Son had also reported to EMS there was concern that he was getting dehydrated and seem more confused than usual.  Patient reportedly incontinent of urine and stool with EMS, has history of recurrent UTI.  Patient denies recent fevers, cough, nausea, vomiting, or dysuria.     Physical Exam   Triage Vital Signs: ED Triage Vitals  Enc Vitals Group     BP      Pulse      Resp      Temp      Temp src      SpO2      Weight      Height      Head Circumference      Peak Flow      Pain Score      Pain Loc      Pain Edu?      Excl. in [place]?     Most recent vital signs: Vitals:   05/08/22 1830 05/08/22 1900  BP: 139/67 135/63  Pulse: 71 70  Resp: 14 17  Temp:  (!) 97.4 F (36.3 C)  SpO2: 91% 97%    Constitutional: Alert and oriented to person, place, time, and situation. Eyes: Conjunctivae are normal. Head: Atraumatic. Nose: No congestion/rhinnorhea. Mouth/Throat: Mucous membranes are dry. Neck: No midline cervical spine tenderness to palpation. Cardiovascular: Normal rate, regular rhythm. Grossly normal heart sounds.  2+ radial pulses bilaterally. Respiratory: Normal respiratory effort.  No  retractions. Lungs CTAB.  No chest wall tenderness to palpation noted. Gastrointestinal: Soft and nontender. No distention.  Patient incontinent of stool with significant skin breakdown in his sacral area, no ulceration noted. Genitourinary: Skin breakdown noted in folds of groin with no ulceration. Musculoskeletal: Diffuse tenderness to palpation of right knee with no obvious deformity, otherwise no bony extremity tenderness to palpation.  Midline thoracic and lumbar spinal tenderness to palpation noted. Neurologic:  Normal speech and language. No gross focal neurologic deficits are appreciated.    ED Results / Procedures / Treatments   Labs (all labs ordered are listed, but only abnormal results are displayed) Labs Reviewed  CBC WITH DIFFERENTIAL/PLATELET - Abnormal; Notable for the following components:      Result Value   RBC 3.02 (*)    Hemoglobin 10.2 (*)    HCT 30.8 (*)    MCV 102.0 (*)    Platelets 131 (*)    All other components within normal limits  COMPREHENSIVE METABOLIC PANEL - Abnormal; Notable for the following components:   Sodium 132 (*)    Glucose, Bld 124 (*)    BUN 27 (*)    Creatinine, Ser 1.32 (*)  Calcium 8.3 (*)    Total Protein 6.0 (*)    Albumin 3.4 (*)    GFR, Estimated 51 (*)    Anion gap 3 (*)    All other components within normal limits  LIPASE, BLOOD - Abnormal; Notable for the following components:   Lipase 59 (*)    All other components within normal limits  URINALYSIS, ROUTINE W REFLEX MICROSCOPIC - Abnormal; Notable for the following components:   Color, Urine YELLOW (*)    APPearance CLEAR (*)    Leukocytes,Ua SMALL (*)    Bacteria, UA RARE (*)    All other components within normal limits  TROPONIN I (HIGH SENSITIVITY) - Abnormal; Notable for the following components:   Troponin I (High Sensitivity) 44 (*)    All other components within normal limits  TROPONIN I (HIGH SENSITIVITY)   RADIOLOGY CT head reviewed and interpreted by me  with no hemorrhage or midline shift.  CT cervical spine reviewed and interpreted by me with no fracture or dislocation.  PROCEDURES:  Critical Care performed: No  Procedures   MEDICATIONS ORDERED IN ED: Medications  lactated ringers bolus 1,000 mL (1,000 mLs Intravenous New Bag/Given 05/08/22 1922)     IMPRESSION / MDM / ASSESSMENT AND PLAN / ED COURSE  I reviewed the triage vital signs and the nursing notes.                              88 y.o. male with past medical history of hypertension, diabetes, CAD status post CABG, CHF, atrial fibrillation on Eliquis, AAA status post repair, stroke, and CKD who presents to the ED for multiple falls and increased confusion per EMS.  Patient's presentation is most consistent with acute presentation with potential threat to life or bodily function.  Differential diagnosis includes, but is not limited to, intracranial injury, cervical spine injury, extremity injury, rib fracture, spinal injury, UTI, electrolyte abnormality, dehydration, AKI, anemia, stroke.  Patient chronically ill-appearing but in no acute distress, vital signs are unremarkable and do not appear concerning for sepsis.  Patient has severe pain when being moved from EMS to hospital stretcher, has a hard time localizing this pain but it seems to primarily be coming from his back.  Given multiple falls with unclear history, will check CT head, cervical spine, chest/abdomen/pelvis.  He additionally has tenderness to palpation at his right knee which we will further assess with x-ray.  He does appear clinically dehydrated and has a history of recurrent UTI, we will hydrate with IV fluids and urinalysis is pending.  Labs are also pending at this time, patient declines pain medication.  There was report of confusion, but he is alert and oriented x 4 at this time with no focal neurologic deficits.  CT imaging is reassuring and negative for acute traumatic injury.  Urinalysis shows no signs  of infection.  Labs are overall reassuring with stable chronic kidney disease, no significant electrolyte abnormality noted.  CBC with stable anemia and no significant leukocytosis.  Troponin is mildly elevated above patient's prior baseline, low suspicion for ACS with his current symptoms but we will trend troponin.  Given patient's multiple falls and difficulty caring for himself at home, he would benefit from admission for further stroke workup and potential placement.  Case discussed with hospitalist for admission.   FINAL CLINICAL IMPRESSION(S) / ED DIAGNOSES   Final diagnoses:  Multiple falls  Acute midline low back pain without sciatica  Generalized weakness     Rx / DC Orders   ED Discharge Orders     None        Note:  This document was prepared using Dragon voice recognition software and may include unintentional dictation errors.   Blake Divine, MD 05/08/22 [id number]

## 2022-05-08 NOTE — Assessment & Plan Note (Signed)
-   We will continue statin therapy. - We will check fasting lipids.

## 2022-05-08 NOTE — ED Notes (Signed)
ED Provider at bedside. 

## 2022-05-08 NOTE — H&P (Signed)
Nathaniel Woods   PATIENT NAME: Nathaniel Woods    MR#:  [id number]  DATE OF BIRTH:  24-Sep-1928  DATE OF ADMISSION:  05/08/2022  PRIMARY CARE PHYSICIAN: Derinda Late, MD   Patient is coming from: Home  REQUESTING/REFERRING PHYSICIAN: Blake Divine, MD  CHIEF COMPLAINT:   Chief Complaint  Patient presents with   Fall    HISTORY OF PRESENT ILLNESS:  Nathaniel Woods is a 88 y.o. Caucasian male with medical history significant for asthma, BPH, CAD, chronic systolic CHF, stage IIIa CKD, hypertension, dyslipidemia, AAA status post endovascular repair, paroxysmal atrial fibrillation on Eliquis, type 2 diabetes mellitus and CVA, presented to the ER with acute onset of recurrent.  3 MS patient had 2 falls yesterday at home.  He had a fall last month on 10/24 and work-up here in the ER was negative then.  When his son went to check on him today he was having difficulty mobilizing and felt much weaker than usual.  He slid out of his recliner but is not sure if he hit his head or lost consciousness.  He was having significant pain when he tries to move and his pain was mainly in his lower back sometimes concerned about dehydration as the patient seemed more confused than his usual.  He has been incontinent of urine and stools per EMS.  No reported fever or chills.  No cough or wheezing or dyspnea.  No chest pain or palpitations.  No nausea or vomiting or abdominal pain.  No reported dysuria, oliguria, hematuria, urgency or frequency or flank pain.  No paresthesias or focal muscle weakness.  No dysphagia or dysarthria.  ED Course: When he came to the ER, temperature was 97.1 and BP 125/54 with otherwise normal vital signs.  Pulse symmetry dropped to 81% on room air but was later on 94 to 97% on room air.  Labs revealed mild hyponatremia 132 and a BUN of 27 creatinine 1.32 compared to 34/1.42 on 04/01/2022, calcium of 8.3 compared to 9.19, albumin of 3.4 compared to 4.19 and to obtain of 6 compared  to 7.  He was 61.  High sensitive troponin I was 44 compared to 20 then.  CBC showed anemia with hemoglobin of 10.2 hematocrit 40.8 compared to 11.3 and 35.4.  Platelets were 131 compared to 149. EKG as reviewed by me : EKG showed normal sinus rhythm with rate of 68 with ventricular bigeminy with short PR interval, right bundle branch block and Q waves inferiorly. Imaging: Right knee x-ray showed mild 3 compartment osteoarthritis and no acute abnormalities.  Noncontrasted head CT scan revealed no acute intra abnormality and C-spine CT showed no acute fracture or subluxation. Chest abdomen and pelvis CT without contrast showed no acute traumatic injury in the chest abdomen or pelvis.  It showed slightly increased infrarenal abdominal aortic aneurysm status post stent grafting, currently measuring 7.8X 8.5 cm, previously 7.9X 8.1 cm.  It showed mild interstitial lung disease and aortic atherosclerosis as well as emphysema.   CT of the thoracic and L-spine showed unchanged T10 and L1-L5 chronic compression deformities with no acute osseous abnormality of the thoracic or lumbar spine.  The patient was given 1 L bolus of IV lactated Ringer.  He will be admitted to a medical telemetry for further evaluation and management. PAST MEDICAL HISTORY:   Past Medical History:  Diagnosis Date   AAA (abdominal aortic aneurysm) without rupture ([place])    AICD (automatic cardioverter/defibrillator) present 12/17/2016   Angina  Anxiety    "just before major surgery"   Arthritis    Asthma    childhood   Basal cell carcinoma 10/07/2021   left superior ear, Exc 10/15/2021   BPH associated with nocturia    Chronic systolic CHF (congestive heart failure) ([place])    Coronary artery disease    Hemorrhagic stroke ([place]) 09/07/2008   S/P fall   HOH (hard of hearing)    Hyperlipidemia    Hypertension    Inferior MI ([place]) 10/08/2011   Kidney stone    PAF (paroxysmal atrial fibrillation) ([place])    Sinus bradycardia     Stroke ([place])    Type II diabetes mellitus ([place])    Improved with weight loss   Wears hearing aid in both ears   Stage IIIa chronic kidney disease  PAST SURGICAL HISTORY:   Past Surgical History:  Procedure Laterality Date   ABDOMINAL AORTIC ANEURYSM REPAIR  2013   APPENDECTOMY  06/09/1945   CARDIAC CATHETERIZATION  10/08/2011   CATARACT EXTRACTION W/PHACO Right 11/05/2021   Procedure: CATARACT EXTRACTION PHACO AND INTRAOCULAR LENS PLACEMENT ([place]) RIGHT;  Surgeon: Birder Robson, MD;  Location: [facility];  Service: Ophthalmology;  Laterality: Right;  6.83 0:45.5   CATARACT EXTRACTION W/PHACO Left 11/19/2021   Procedure: CATARACT EXTRACTION PHACO AND INTRAOCULAR LENS PLACEMENT ([facility]) LEFT 11.02 01:02.8;  Surgeon: Birder Robson, MD;  Location: [facility];  Service: Ophthalmology;  Laterality: Left;   CORONARY ANGIOPLASTY WITH STENT PLACEMENT  06/10/2007   "2; made total of 6"   CORONARY ARTERY BYPASS GRAFT  10/24/2011   Procedure: CORONARY ARTERY BYPASS GRAFTING (CABG);  Surgeon: Ivin Poot, MD;  Location: [facility];  Service: Open Heart Surgery;  Laterality: N/A;  Coronary Artery Bypass Graft times four using the left internal mammary artery and the right and left greater saphenous veins harvested endoscopically.  transesophageal Echocardiogram   ICD IMPLANT  12/17/2016   INTERTROCHANTERIC HIP FRACTURE SURGERY  05/10/1991   "pin, 2 screws, plate"; right   LITHOTRIPSY  ~ 04/2011   TONSILLECTOMY  06/09/1942   VASECTOMY  ~ 1970    SOCIAL HISTORY:   Social History   Tobacco Use   Smoking status: Former    Packs/day: 1.00    Years: 24.00    Total pack years: 24.00    Types: Cigarettes    Quit date: 06/09/1970    Years since quitting: 51.9   Smokeless tobacco: Never  Substance Use Topics   Alcohol use: Not Currently    Alcohol/week: 28.0 standard drinks of alcohol    Types: 28 Shots of liquor per week    Comment: None for several years    FAMILY  HISTORY:   Family History  Problem Relation Age of Onset   Breast cancer Sister     DRUG ALLERGIES:   Allergies  Allergen Reactions   Levofloxacin Rash   Ambien [Zolpidem Tartrate] Other (See Comments)    Hallucinations and per pt he walked around at night and doesn't remember.   Aspirin Other (See Comments)    Hemorrhagic Stroke   Bee Venom Other (See Comments)    Reaction: fever   Chlorpheniramine Other (See Comments)    Reaction: unknown   Decongestant [Pseudoephedrine Hcl] Other (See Comments)    Reaction: unknown   Simvastatin Other (See Comments)    Other reaction(s): Muscle Pain   Zolpidem Other (See Comments)    Other reaction(s): Unknown "went bananas"   Contrast Media [Iodinated Contrast Media] Rash  REVIEW OF SYSTEMS:   ROS As per history of present illness. All pertinent systems were reviewed above. Constitutional, HEENT, cardiovascular, respiratory, GI, GU, musculoskeletal, neuro, psychiatric, endocrine, integumentary and hematologic systems were reviewed and are otherwise negative/unremarkable except for positive findings mentioned above in the HPI.   MEDICATIONS AT HOME:   Prior to Admission medications   Medication Sig Start Date End Date Taking? Authorizing Provider  acetaminophen (TYLENOL) 325 MG tablet Take 2 tablets (650 mg total) by mouth every 6 (six) hours as needed for mild pain (or Fever >/= 101). 03/30/19   Nicholes Mango, MD  apixaban (ELIQUIS) 5 MG TABS tablet Take 5 mg by mouth 2 (two) times daily.     [provider]  Calcium Carbonate-Vit D-Min (CALCIUM 600+D3 PLUS MINERALS) 600-800 MG-UNIT TABS Take 1 tablet by mouth every evening.    [provider]  citalopram (CELEXA) 20 MG tablet Take 20 mg by mouth every evening.    [provider]  Coenzyme Q10 (COQ10) 200 MG CAPS Take 200 mg by mouth every evening.    [provider]  finasteride (PROSCAR) 5 MG tablet Take 5 mg by mouth every evening.    [provider]  furosemide (LASIX) 20 MG tablet Take 20 mg by mouth in the morning.    [provider]  latanoprost (XALATAN) 0.005 % ophthalmic solution Place 1 drop into both eyes at bedtime.     [provider]  metoprolol succinate (TOPROL-XL) 25 MG 24 hr tablet Take 25 mg by mouth daily.    [provider]  mirtazapine (REMERON) 15 MG tablet Take 1 tablet (15 mg total) by mouth at bedtime. 02/27/22   Sharen Hones, MD  montelukast (SINGULAIR) 10 MG tablet Take 10 mg by mouth at bedtime.     [provider]  rosuvastatin (CRESTOR) 5 MG tablet Take 5 mg by mouth every Monday, Wednesday, and Friday. (Evening)    [provider]  spironolactone (ALDACTONE) 25 MG tablet Take 12.5 mg by mouth in the morning.    [provider]      VITAL SIGNS:  Blood pressure 127/66, pulse 72, temperature (!) 97.4 F (36.3 C), temperature source Oral, resp. rate 18, height '6\' 3"'$  (1.905 m), weight 82.8 kg, SpO2 94 %.  PHYSICAL EXAMINATION:  Physical Exam  GENERAL:  88 y.o.-year-old Caucasian male patient lying in the bed with no acute distress.  EYES: Pupils equal, round, reactive to light and accommodation. No scleral icterus.  He has a right medial subconjunctival hemorrhage with mild chemosis.  Extraocular muscles intact.  HEENT: Head atraumatic, normocephalic. Oropharynx and nasopharynx clear.  NECK:  Supple, no jugular venous distention. No thyroid enlargement, no tenderness.  LUNGS: Normal breath sounds bilaterally, no wheezing, rales,rhonchi or crepitation. No use of accessory muscles of respiration.  CARDIOVASCULAR: Regular rate and rhythm, S1, S2 normal. No murmurs, rubs, or gallops.  ABDOMEN: Soft, nondistended, nontender. Bowel sounds present. No organomegaly or mass.  EXTREMITIES: No pedal edema, cyanosis, or clubbing.  NEUROLOGIC: Cranial nerves II through XII are intact. Muscle strength 5/5 in all extremities. Sensation intact. Gait not  checked.  PSYCHIATRIC: The patient is alert and oriented x 3.  Normal affect and good eye contact. SKIN: No obvious rash, lesion, or ulcer.   LABORATORY PANEL:   CBC Recent Labs  Lab 05/08/22 1621  WBC 8.7  HGB 10.2*  HCT 30.8*  PLT 131*   ------------------------------------------------------------------------------------------------------------------  Chemistries  Recent Labs  Lab 05/08/22 1621  NA  132*  K 4.1  CL 107  CO2 22  GLUCOSE 124*  BUN 27*  CREATININE 1.32*  CALCIUM 8.3*  AST 20  ALT 12  ALKPHOS 52  BILITOT 0.8   ------------------------------------------------------------------------------------------------------------------  Cardiac Enzymes No results for input(s): "TROPONINI" in the last 168 hours. ------------------------------------------------------------------------------------------------------------------  RADIOLOGY:  CT L-SPINE NO CHARGE  Result Date: 05/08/2022 CLINICAL DATA:  Multiple recent falls. EXAM: CT THORACIC AND LUMBAR SPINE WITHOUT CONTRAST TECHNIQUE: Multiplanar CT images of the thoracic and lumbar spine were reconstructed from contemporary CT of the Chest, Abdomen, and Pelvis. RADIATION DOSE REDUCTION: This exam was performed according to the departmental dose-optimization program which includes automated exposure control, adjustment of the mA and/or kV according to patient size and/or use of iterative reconstruction technique. CONTRAST:  None. COMPARISON:  CT abdomen pelvis dated February 23, 2022. FINDINGS: CT THORACIC SPINE FINDINGS Alignment: Dextroscoliosis.  No traumatic malalignment. Vertebrae: No acute fracture chronic T10 compression deformity is unchanged. Paraspinal and other soft tissues: Please see separate CT chest, abdomen, and pelvis report from same day. Disc levels: Disc heights are relatively preserved. Scattered mild-to-moderate facet arthropathy. CT LUMBAR SPINE FINDINGS Segmentation: Unchanged partial sacralization  of L5 on the left. Alignment: No traumatic malalignment. Vertebrae: No acute fracture. Chronic L1 through L5 superior endplate compression deformities are unchanged. Paraspinal and other soft tissues: Please see separate CT chest, abdomen, and pelvis report from same day. Disc levels: Multilevel disc degeneration and advanced facet arthropathy. IMPRESSION: 1. No acute osseous abnormality of the thoracic or lumbar spine. 2. Unchanged T10 and L1 through L5 chronic compression deformities. Electronically Signed   By: Titus Dubin M.D.   On: 05/08/2022 17:13   CT T-SPINE NO CHARGE  Result Date: 05/08/2022 CLINICAL DATA:  Multiple recent falls. EXAM: CT THORACIC AND LUMBAR SPINE WITHOUT CONTRAST TECHNIQUE: Multiplanar CT images of the thoracic and lumbar spine were reconstructed from contemporary CT of the Chest, Abdomen, and Pelvis. RADIATION DOSE REDUCTION: This exam was performed according to the departmental dose-optimization program which includes automated exposure control, adjustment of the mA and/or kV according to patient size and/or use of iterative reconstruction technique. CONTRAST:  None. COMPARISON:  CT abdomen pelvis dated February 23, 2022. FINDINGS: CT THORACIC SPINE FINDINGS Alignment: Dextroscoliosis.  No traumatic malalignment. Vertebrae: No acute fracture chronic T10 compression deformity is unchanged. Paraspinal and other soft tissues: Please see separate CT chest, abdomen, and pelvis report from same day. Disc levels: Disc heights are relatively preserved. Scattered mild-to-moderate facet arthropathy. CT LUMBAR SPINE FINDINGS Segmentation: Unchanged partial sacralization of L5 on the left. Alignment: No traumatic malalignment. Vertebrae: No acute fracture. Chronic L1 through L5 superior endplate compression deformities are unchanged. Paraspinal and other soft tissues: Please see separate CT chest, abdomen, and pelvis report from same day. Disc levels: Multilevel disc degeneration and  advanced facet arthropathy. IMPRESSION: 1. No acute osseous abnormality of the thoracic or lumbar spine. 2. Unchanged T10 and L1 through L5 chronic compression deformities. Electronically Signed   By: Titus Dubin M.D.   On: 05/08/2022 17:13   DG Knee 2 Views Right  Result Date: 05/08/2022 CLINICAL DATA:  Multiple falls, right knee pain EXAM: RIGHT KNEE - 1-2 VIEW COMPARISON:  None Available. FINDINGS: Frontal and cross-table lateral views of the right knee are obtained on 2 images. The bones are osteopenic. No acute fracture, subluxation, or dislocation. There is mild 3 compartmental osteoarthritis. No joint effusion. Diffuse atherosclerosis. Soft tissues are otherwise unremarkable. IMPRESSION: 1. No acute bony abnormality. 2. Mild 3 compartmental  osteoarthritis. 3. Osteopenia. Electronically Signed   By: Randa Ngo M.D.   On: 05/08/2022 17:11   CT CHEST ABDOMEN PELVIS WO CONTRAST  Result Date: 05/08/2022 CLINICAL DATA:  Several falls over last few days. EXAM: CT CHEST, ABDOMEN AND PELVIS WITHOUT CONTRAST TECHNIQUE: Multidetector CT imaging of the chest, abdomen and pelvis was performed following the standard protocol without IV contrast. RADIATION DOSE REDUCTION: This exam was performed according to the departmental dose-optimization program which includes automated exposure control, adjustment of the mA and/or kV according to patient size and/or use of iterative reconstruction technique. COMPARISON:  CT abdomen pelvis dated February 23, 2022. FINDINGS: CT CHEST FINDINGS Cardiovascular: Cardiomegaly. Prior CABG. No pericardial effusion. No thoracic aortic aneurysm. Coronary, aortic arch, and branch vessel atherosclerotic vascular disease. Left chest wall AICD. Mediastinum/Nodes: No enlarged mediastinal, hilar, or axillary lymph nodes. Thyroid gland, trachea, and esophagus demonstrate no significant findings. Lungs/Pleura: Mild paraseptal emphysema. Increased subpleural reticulation at the lung  bases. No focal consolidation, pleural effusion, or pneumothorax. Musculoskeletal: No acute or significant osseous findings. Multiple old bilateral rib fractures. CT ABDOMEN PELVIS FINDINGS Hepatobiliary: No hepatic injury or perihepatic hematoma. Gallbladder is unremarkable. No biliary dilatation. Pancreas: Unremarkable. No pancreatic ductal dilatation or surrounding inflammatory changes. Spleen: No splenic injury or perisplenic hematoma. Adrenals/Urinary Tract: Adrenal glands are unremarkable. Unchanged bilateral renal cysts. No follow-up imaging is recommended. No renal calculi or hydronephrosis. The bladder is unremarkable. Stomach/Bowel: Stomach is within normal limits. History of prior appendectomy. No evidence of bowel wall thickening, distention, or inflammatory changes. Left-sided colonic diverticulosis. Vascular/Lymphatic: Slightly increased infrarenal abdominal aortic aneurysm status post stent grafting, currently measuring 7.8 x 8.5 cm, previously 7.9 x 8.1 cm. Aortoiliac atherosclerotic vascular disease. No enlarged abdominal or pelvic lymph nodes. Reproductive: Prostate is unremarkable. Other: Trace free fluid in the pelvis, nonspecific. No pneumoperitoneum. Musculoskeletal: Multiple chronic thoracic and lumbar vertebral compression deformities are unchanged. Old healed right intertrochanteric femur fracture status post ORIF. IMPRESSION: 1. No acute traumatic injury in the chest, abdomen, or pelvis. 2. Slightly increased infrarenal abdominal aortic aneurysm status post stent grafting, currently measuring 7.8 x 8.5 cm, previously 7.9 x 8.1 cm. 3. Mild interstitial lung disease. 4. Aortic Atherosclerosis (ICD10-I70.0) and Emphysema (ICD10-J43.9). Electronically Signed   By: Titus Dubin M.D.   On: 05/08/2022 17:06   CT Head Wo Contrast  Result Date: 05/08/2022 CLINICAL DATA:  Head trauma, minor (Age >= 65y); Neck trauma (Age >= 65y) EXAM: CT HEAD WITHOUT CONTRAST CT CERVICAL SPINE WITHOUT  CONTRAST TECHNIQUE: Multidetector CT imaging of the head and cervical spine was performed following the standard protocol without intravenous contrast. Multiplanar CT image reconstructions of the cervical spine were also generated. RADIATION DOSE REDUCTION: This exam was performed according to the departmental dose-optimization program which includes automated exposure control, adjustment of the mA and/or kV according to patient size and/or use of iterative reconstruction technique. COMPARISON:  04/01/2022 FINDINGS: CT HEAD FINDINGS Brain: No evidence of acute infarction, hemorrhage, hydrocephalus, extra-axial collection or mass lesion/mass effect. Scattered low-density changes within the periventricular and subcortical white matter compatible with chronic microvascular ischemic change. Moderate diffuse cerebral volume loss. Vascular: Atherosclerotic calcifications involving the large vessels of the skull base. No unexpected hyperdense vessel. Skull: Normal. Negative for fracture or focal lesion. Sinuses/Orbits: No acute finding. Other: Negative for scalp hematoma. CT CERVICAL SPINE FINDINGS Alignment: Facet joints are aligned without dislocation or traumatic listhesis. Dens and lateral masses are aligned. Skull base and vertebrae: No acute fracture. No primary bone lesion or focal pathologic  process. Soft tissues and spinal canal: No prevertebral fluid or swelling. No visible canal hematoma. Disc levels:  Multilevel cervical spondylosis, unchanged from prior. Upper chest: Negative. Other: Bilateral carotid atherosclerosis. IMPRESSION: 1. No acute intracranial abnormality. 2. No acute fracture or subluxation of the cervical spine. Electronically Signed   By: Davina Poke D.O.   On: 05/08/2022 16:58   CT Cervical Spine Wo Contrast  Result Date: 05/08/2022 CLINICAL DATA:  Head trauma, minor (Age >= 65y); Neck trauma (Age >= 65y) EXAM: CT HEAD WITHOUT CONTRAST CT CERVICAL SPINE WITHOUT CONTRAST TECHNIQUE:  Multidetector CT imaging of the head and cervical spine was performed following the standard protocol without intravenous contrast. Multiplanar CT image reconstructions of the cervical spine were also generated. RADIATION DOSE REDUCTION: This exam was performed according to the departmental dose-optimization program which includes automated exposure control, adjustment of the mA and/or kV according to patient size and/or use of iterative reconstruction technique. COMPARISON:  04/01/2022 FINDINGS: CT HEAD FINDINGS Brain: No evidence of acute infarction, hemorrhage, hydrocephalus, extra-axial collection or mass lesion/mass effect. Scattered low-density changes within the periventricular and subcortical white matter compatible with chronic microvascular ischemic change. Moderate diffuse cerebral volume loss. Vascular: Atherosclerotic calcifications involving the large vessels of the skull base. No unexpected hyperdense vessel. Skull: Normal. Negative for fracture or focal lesion. Sinuses/Orbits: No acute finding. Other: Negative for scalp hematoma. CT CERVICAL SPINE FINDINGS Alignment: Facet joints are aligned without dislocation or traumatic listhesis. Dens and lateral masses are aligned. Skull base and vertebrae: No acute fracture. No primary bone lesion or focal pathologic process. Soft tissues and spinal canal: No prevertebral fluid or swelling. No visible canal hematoma. Disc levels:  Multilevel cervical spondylosis, unchanged from prior. Upper chest: Negative. Other: Bilateral carotid atherosclerosis. IMPRESSION: 1. No acute intracranial abnormality. 2. No acute fracture or subluxation of the cervical spine. Electronically Signed   By: Davina Poke D.O.   On: 05/08/2022 16:58      IMPRESSION AND PLAN:  Assessment and Plan: * Recurrent falls - The patient be admitted to a medical telemetry bed. - She has subsequent acute mid low back pain and generalized weakness.  Radiographic studies have been  unremarkable for acute injury. - PT consult will be obtained to assess his ambulation. - He will be placed on fall precautions. - Neurochecks will be obtained every 4 hours for 24 hours. - We will hold off his Elavil. - We will obtain a brain MRI without contrast to assess for VBI contributing to his falls. - Case management consult to be obtained to assess his home safety and potential need for SNF rehab.  Hyponatremia - This is mild and it is likely hypovolemic. - We will hold diuretic therapy for now. - We will hydrate with IV normal saline and follow sodium level.  Paroxysmal atrial fibrillation (HCC) - We will obtain an EKG. - We will continue his Toprol-XL. - Given his history of CVA we will titrate continue Eliquis while assessing his ambulation and placing him on fall precautions. - He may need to come off Eliquis ultimately if he continues to have recurrent falls.  Type II diabetes mellitus ([place]) - We will place him on supplemental coverage with NovoLog. - We will check his hemoglobin A1c level.  Essential hypertension - We will continue his Toprol-XL.  Depression - We will continue as Celexa and Remeron  BPH (benign prostatic hyperplasia) - We will continue as Proscar  Asthma, chronic - We will continue his Singulair and place him on  as needed DuoNebs.  Coronary artery disease - We will continue his Toprol-XL and statin therapy.  Dyslipidemia - We will continue statin therapy. - We will check fasting lipids.    DVT prophylaxis: Eliquis.   Advanced Care Planning:  Code Status: full code.  Family Communication:  The plan of care was discussed in details with the patient (and family). I answered all questions. The patient agreed to proceed with the above mentioned plan. Further management will depend upon hospital course. Disposition Plan: Back to previous home environment Consults called: none.  All the records are reviewed and case discussed with ED  provider.  Status is: Inpatient   At the time of the admission, it appears that the appropriate admission status for this patient is inpatient.  This is judged to be reasonable and necessary in order to provide the required intensity of service to ensure the patient's safety given the presenting symptoms, physical exam findings and initial radiographic and laboratory data in the context of comorbid conditions.  The patient requires inpatient status due to high intensity of service, high risk of further deterioration and high frequency of surveillance required.  I certify that at the time of admission, it is my clinical judgment that the patient will require inpatient hospital care extending more than 2 midnights.                            Dispo: The patient is from: Home              Anticipated d/c is to: Home              Patient currently is not medically stable to d/c.              Difficult to place patient: No  Christel Mormon M.D on 05/08/2022 at 8:19 PM  [facility]   From 7 PM-7 AM, contact night-coverage [email or web address]  CC: Primary care physician; Derinda Late, MD

## 2022-05-08 NOTE — Assessment & Plan Note (Signed)
-   We will continue his Toprol-XL and statin therapy.

## 2022-05-08 NOTE — Assessment & Plan Note (Signed)
-   We will continue his Singulair and place him on as needed DuoNebs.

## 2022-05-08 NOTE — Assessment & Plan Note (Addendum)
Likely hypovolemia. Pt's son reports issues with patient getting dehydrated frequently. Na improving with IV fluids. --Continue IV fluids --Monitor BMP

## 2022-05-08 NOTE — ED Notes (Signed)
Dr. Mansy at bedside. 

## 2022-05-08 NOTE — Assessment & Plan Note (Signed)
-   We will continue as Celexa and Remeron

## 2022-05-09 ENCOUNTER — Inpatient Hospital Stay: Payer: PPO

## 2022-05-09 ENCOUNTER — Encounter: Payer: Self-pay | Admitting: Family Medicine

## 2022-05-09 LAB — CBC
HCT: 29.6 % — ABNORMAL LOW (ref 39.0–52.0)
Hemoglobin: 9.4 g/dL — ABNORMAL LOW (ref 13.0–17.0)
MCH: 32.4 pg (ref 26.0–34.0)
MCHC: 31.8 g/dL (ref 30.0–36.0)
MCV: 102.1 fL — ABNORMAL HIGH (ref 80.0–100.0)
Platelets: 132 10*3/uL — ABNORMAL LOW (ref 150–400)
RBC: 2.9 MIL/uL — ABNORMAL LOW (ref 4.22–5.81)
RDW: 13.8 % (ref 11.5–15.5)
WBC: 7.7 10*3/uL (ref 4.0–10.5)
nRBC: 0 % (ref 0.0–0.2)

## 2022-05-09 LAB — BASIC METABOLIC PANEL
Anion gap: 5 (ref 5–15)
BUN: 20 mg/dL (ref 8–23)
CO2: 19 mmol/L — ABNORMAL LOW (ref 22–32)
Calcium: 8.3 mg/dL — ABNORMAL LOW (ref 8.9–10.3)
Chloride: 112 mmol/L — ABNORMAL HIGH (ref 98–111)
Creatinine, Ser: 1.09 mg/dL (ref 0.61–1.24)
GFR, Estimated: >60 mL/min (ref >60)
Glucose, Bld: 89 mg/dL (ref 70–99)
Potassium: 3.8 mmol/L (ref 3.5–5.1)
Sodium: 136 mmol/L (ref 135–145)

## 2022-05-09 MED ORDER — LIDOCAINE 5 % EX PTCH
1.0000 | MEDICATED_PATCH | CUTANEOUS | Status: DC
  Administered 2022-05-09 – 2022-05-10 (×2): 1 via TRANSDERMAL
  Filled 2022-05-09 (×2): qty 1

## 2022-05-09 MED ORDER — TRAMADOL HCL 50 MG PO TABS
50.0000 mg | ORAL_TABLET | Freq: Four times a day (QID) | ORAL | Status: DC | PRN
  Administered 2022-05-10 – 2022-05-11 (×3): 50 mg via ORAL
  Filled 2022-05-09 (×5): qty 1

## 2022-05-09 MED ORDER — ACETAMINOPHEN 500 MG PO TABS
1000.0000 mg | ORAL_TABLET | Freq: Three times a day (TID) | ORAL | Status: DC
  Administered 2022-05-09 – 2022-05-11 (×6): 1000 mg via ORAL
  Filled 2022-05-09 (×6): qty 2

## 2022-05-09 NOTE — Hospital Course (Signed)
Nathaniel Woods is a 88 y.o. Caucasian male with medical history significant for asthma, BPH, CAD, chronic systolic CHF, stage IIIa CKD, hypertension, dyslipidemia, AAA status post endovascular repair, paroxysmal atrial fibrillation on Eliquis, type 2 diabetes mellitus and CVA, presented to the ER on 05/08/2022 for evaluation of recurrent falls.  He reportedly had 2 falls yesterday at home.  He had a fall last month on 10/24 and work-up here in the ER was negative then.  When his son went to check on him, he was having difficulty mobilizing and felt much weaker than usual, prompting ED visit for evaluation.  ED evaluation was essentially unremarkable.  Vitals were stable, afebrile.  Labs notable only for mild hyponatremia (Na 132), BUN 27 and Cr 1.32 (similar to prior).  Hs-troponin was mildly elevated without acute ischemic EKG changes or active chest pain.  Extensive imaging was negative for acute injuries, did show chronic compression deformities of T10 and L1-L5.    Admitted to hospital for further evaluation and management. PT/OT consults due to recurrent falls.  MRI brain was planned to assess for vertebrobasilar insufficiency, but patient has an AICD in place, therefore unable to obtain MRI.

## 2022-05-09 NOTE — Progress Notes (Addendum)
Progress Note   Patient: Nathaniel Woods [id number] DOB: 1928-11-25 DOA: 05/08/2022     2 DOS: the patient was seen and examined on 05/09/2022   Brief hospital course: JHORDAN NACHREINER is a 88 y.o. Caucasian male with medical history significant for asthma, BPH, CAD, chronic systolic CHF, stage IIIa CKD, hypertension, dyslipidemia, AAA status post endovascular repair, paroxysmal atrial fibrillation on Eliquis, type 2 diabetes mellitus and CVA, presented to the ER on 05/08/2022 for evaluation of recurrent falls.  He reportedly had 2 falls yesterday at home.  He had a fall last month on 10/24 and work-up here in the ER was negative then.  When his son went to check on him, he was having difficulty mobilizing and felt much weaker than usual, prompting ED visit for evaluation.  ED evaluation was essentially unremarkable.  Vitals were stable, afebrile.  Labs notable only for mild hyponatremia (Na 132), BUN 27 and Cr 1.32 (similar to prior).  Hs-troponin was mildly elevated without acute ischemic EKG changes or active chest pain.  Extensive imaging was negative for acute injuries, did show chronic compression deformities of T10 and L1-L5.    Admitted to hospital for further evaluation and management. PT/OT consults due to recurrent falls.  MRI brain was planned to assess for vertebrobasilar insufficiency, but patient has an AICD in place, therefore unable to obtain MRI.       Assessment and Plan: * Recurrent falls Acute on chronic mid low back pain and generalized weakness.  Radiographic studies have been unremarkable for acute injury. --PT evaluation --Fall precautions --Pain control for back pain, minimize sedating meds as much as possible --Family will decline SNF, citing he "would not survive it", agreeable to [facility] for rehab.  They are arranging full time caregiver support.  Hyponatremia Likely hypovolemia. Pt's son reports issues with patient getting dehydrated frequently. Na improving with  IV fluids. --Continue IV fluids --Monitor BMP  Paroxysmal atrial fibrillation (HCC) Rate controlled. Continue Eliquis and metoprolol.  Type II diabetes mellitus (HCC) Sliding scale Novolog  Essential hypertension - We will continue his Toprol-XL.  Depression - We will continue as Celexa and Remeron  BPH (benign prostatic hyperplasia) - We will continue as Proscar  Asthma, chronic - We will continue his Singulair and place him on as needed DuoNebs.  Coronary artery disease - We will continue his Toprol-XL and statin therapy.  Dyslipidemia - We will continue statin therapy. - We will check fasting lipids.        Subjective: Pt was sleeping and a bit difficult to arouse.  He is very hard of hearing and appears to have cognitive impairment.  In answering all my questions, he continued to state his son is coming to pick him up.  He denies acute complaints.   Physical Exam: Vitals:   05/09/22 1100 05/09/22 1455 05/09/22 2344 05/10/22 0735  BP: 118/77 (!) 107/59 113/64 124/76  Pulse: 78 91 61 77  Resp:   16 16  Temp: 97.9 F (36.6 C) 98 F (36.7 C) 97.8 F (36.6 C) 98.8 F (37.1 C)  TempSrc:      SpO2: 100% 94% 93% 92%  Weight:      Height:       General exam: awake, alert, no acute distress HEENT: hard of hearing, moist MM's Respiratory system: CTAB, no wheezes, rales or rhonchi, normal respiratory effort. Cardiovascular system: normal S1/S2, RRR, no pedal edema.   Gastrointestinal system: soft, NT, ND, no HSM felt, +bowel sounds. Central nervous system: A&O  x self. no gross focal neurologic deficits, normal speech. Exam limited by cognition and hard of hearing Extremities: moves all, no edema, normal tone Skin: dry, intact, normal temperature, normal color, No rashes, lesions or ulcers Psychiatry: normal mood, congruent affect  Data Reviewed:  Notable labs ---  Cr improved 1.32 >> 1.09.  Na 132 >> 136.  Hbg 10.2 >>9.4 likely dilutional. Platelets 131 >>  132k  Family Communication: Spoke with son at bedside later this afternoon  Disposition: Status is: Inpatient Remains inpatient appropriate because: remains on iV fluids due to dehydration. Evaluation still underway to assess for underlying cause of falls   Planned Discharge Destination: Home with Home Health    Time spent: 45 minutes  Author: Ezekiel Slocumb, DO 05/10/2022 8:23 AM  For on call review [email or web address].

## 2022-05-09 NOTE — Evaluation (Signed)
Physical Therapy Evaluation Patient Details Name: Nathaniel Woods MRN: [id number] DOB: March 29, 1929 Today's Date: 05/09/2022  History of Present Illness  Nathaniel Woods is a 16yoM who comes to [facility] on 05/08/22 after 2 falls at home. PMH: HTN, DM2, CAD status post CABG, AF on Eliquis, AAA status post endovascular repair, stroke, CKD, and CHF. Baseline level includes household AMB c RW, active with therapy for PT/OT RN at present.  Clinical Impression  Pt in bed on entry, obviously quite overwhelmed emotionally, anxious and restless, asking repeatedly to go home and expressing repeat verbal discontent regarding not being in his home or on his furniture. Multiple attempts to engage pt in calming reassurance or mobility needs at present are just barely helpful, but he is ultimately agreeable to attempt coming to EOB. He is only willing to use a movement strategy that is familiar, despite it being remarkably painful and difficult this date, very reluctant to allow any physical assistance until after several minutes of tortured attempts and limited success. At EOB (foot of bed) with seat elevated (pt tall in stature) he is much more comfortable and calm. His back pain improves quite a bit and is largely unconcerning by end of session. Pt requires a large degree of seat surface elevation combined with modA lift, more physical assist than what is typical and more than son can provide medically at present. After several minutes at EOB, pt agreeable to step pivot to recliner. Additional mobility not attempted at this point due to high risk for further sun-downing irritability/overstimulation. Pt finally appears comfortable and less anxious. Pt beginning to perseverate on leaving immediately again while son offers consolation. We will attempt AMB next day to further assess readiness for return to home. Son is concerned that pt will become inconsolable in the context of a lengthy admission, wishes to avoid this if at all  possible.      Recommendations for follow up therapy are one component of a multi-disciplinary discharge planning process, led by the attending physician.  Recommendations may be updated based on patient status, additional functional criteria and insurance authorization.  Follow Up Recommendations Home health PT (family/patient strong preference in avoiding facility placement)      Assistance Recommended at Discharge Intermittent Supervision/Assistance  Patient can return home with the following  A lot of help with walking and/or transfers;A lot of help with bathing/dressing/bathroom;Help with stairs or ramp for entrance;Assist for transportation;Assistance with cooking/housework;Direct supervision/assist for medications management    Equipment Recommendations None recommended by PT  Recommendations for Other Services       Functional Status Assessment Patient has had a recent decline in their functional status and demonstrates the ability to make significant improvements in function in a reasonable and predictable amount of time.     Precautions / Restrictions Precautions Precautions: Fall;Back Precaution Booklet Issued: No Restrictions Weight Bearing Restrictions: No      Mobility  Bed Mobility Overal bed mobility: Needs Assistance Bed Mobility: Supine to Sit     Supine to sit: Mod assist, HOB elevated     General bed mobility comments: pt demands to scoot caudally toward FOB with bilat rails up; ultimately authro elevated HOB and author/son assist with draw sheet in forward scoot of hips. It takes a LONG time for patient to be agreeable to having assistance with this. (At EOB pt is comfortable, steady, balanced, and left thoracolumbar back pain is much improved.)    Transfers Overall transfer level: Needs assistance Equipment used: Rolling walker (2 wheels) Transfers:  Sit to/from Stand, Bed to chair/wheelchair/BSC Sit to Stand: Mod assist, From elevated surface Stand  pivot transfers: Supervision, From elevated surface (once up and steady managed RW well for step pivot to recliner + pillow)         General transfer comment: Performed twice; delayed acquisition of balance ~3-5sec once up. Pt needs significant lift assist with high surface and has elevated falls anxuiety    Ambulation/Gait Ambulation/Gait assistance:  (deferred to later now that pt is finally less aggitated and appears comfortable.)                Stairs            Wheelchair Mobility    Modified Rankin (Stroke Patients Only)       Balance Overall balance assessment: History of Falls, Needs assistance Sitting-balance support: No upper extremity supported, Feet supported Sitting balance-Leahy Scale: Good     Standing balance support: Reliant on assistive device for balance, During functional activity Standing balance-Leahy Scale: Poor Standing balance comment: delayed acquisition, limited confidence initially                             Pertinent Vitals/Pain Pain Assessment Pain Assessment:  (9/10 while in bed (pooorly tolerated historically); essentially a non issue by end of session) Pain Intervention(s): Limited activity within patient's tolerance, Patient requesting pain meds-RN notified    Home Living Family/patient expects to be discharged to:: Private residence (would prefer facility based rehab) Living Arrangements: Alone Available Help at Discharge: Family;Available PRN/intermittently (paid caregiver sin home 4d/week; son Nathaniel Woods there daily to assist; moving to 7day/week care) Type of Home: House Home Access: Level entry Entrance Stairs-Rails: None Entrance Stairs-Number of Steps: 1"-inch threshold at front door   Home Layout: Two level;Able to live on main level with bedroom/bathroom Home Equipment: Grab bars - tub/shower;Cane - single Barista (2 wheels);Rollator (4 wheels) Additional Comments: Pt reports home is handicap  accessible. Son checks on pt daily, has aide that come 4x/wk soon at 7x/week.    Prior Function Prior Level of Function : Needs assist             Mobility Comments: household distance AMB with RW, minA for ADL suypport, sleeps in a very flat lift chair recliner.       Hand Dominance        Extremity/Trunk Assessment   Upper Extremity Assessment Upper Extremity Assessment: Generalized weakness    Lower Extremity Assessment Lower Extremity Assessment: Generalized weakness       Communication   Communication: HOH  Cognition Arousal/Alertness: Awake/alert Behavior During Therapy: Agitated, Restless, Anxious                                   General Comments: very limited insight into need to be here, anxiety trumps all. Pt perseverates on performing bed mobility identical to how he gets out of recliner at home despite sppearing tortured in pain and debility.        General Comments      Exercises     Assessment/Plan    PT Assessment Patient needs continued PT services  PT Problem List Decreased strength;Decreased activity tolerance;Decreased mobility       PT Treatment Interventions Functional mobility training;Therapeutic activities;Therapeutic exercise;Patient/family education;Gait training    PT Goals (Current goals can be found in the Care Plan section)  Acute Rehab PT  Goals Patient Stated Goal: get patient home as soon as possible once he can walk PT Goal Formulation: With patient Time For Goal Achievement: 05/23/22 Potential to Achieve Goals: Good    Frequency Min 2X/week     Co-evaluation               AM-PAC PT "6 Clicks" Mobility  Outcome Measure Help needed turning from your back to your side while in a flat bed without using bedrails?: Total Help needed moving from lying on your back to sitting on the side of a flat bed without using bedrails?: Total Help needed moving to and from a bed to a chair (including a  wheelchair)?: A Lot Help needed standing up from a chair using your arms (e.g., wheelchair or bedside chair)?: A Lot Help needed to walk in hospital room?: A Lot Help needed climbing 3-5 steps with a railing? : Total 6 Click Score: 9    End of Session   Activity Tolerance: Patient tolerated treatment well;Treatment limited secondary to agitation Patient left: in chair;with family/visitor present;with call bell/phone within reach Nurse Communication: Mobility status;Patient requests pain meds PT Visit Diagnosis: Other abnormalities of gait and mobility (R26.89);Difficulty in walking, not elsewhere classified (R26.2)    Time: 1225-8346 PT Time Calculation (min) (ACUTE ONLY): 54 min   Charges:   PT Evaluation $PT Eval Low Complexity: 1 Low PT Treatments $Therapeutic Activity: 23-37 mins       5:53 PM, 05/09/22 Etta Grandchild, PT, DPT Physical Therapist - [facility] ([place])  [phone] ([place])    Demarqus Jocson C 05/09/2022, 5:45 PM

## 2022-05-09 NOTE — ED Notes (Signed)
Informed RN bed assigned 

## 2022-05-10 ENCOUNTER — Encounter: Payer: Self-pay | Admitting: Family Medicine

## 2022-05-10 DIAGNOSIS — R531 Weakness: Secondary | ICD-10-CM | POA: Diagnosis not present

## 2022-05-10 DIAGNOSIS — I480 Paroxysmal atrial fibrillation: Secondary | ICD-10-CM | POA: Diagnosis not present

## 2022-05-10 DIAGNOSIS — M545 Low back pain, unspecified: Secondary | ICD-10-CM | POA: Diagnosis not present

## 2022-05-10 DIAGNOSIS — R296 Repeated falls: Secondary | ICD-10-CM | POA: Diagnosis not present

## 2022-05-10 LAB — BASIC METABOLIC PANEL
Anion gap: 4 — ABNORMAL LOW (ref 5–15)
BUN: 21 mg/dL (ref 8–23)
CO2: 22 mmol/L (ref 22–32)
Calcium: 8.5 mg/dL — ABNORMAL LOW (ref 8.9–10.3)
Chloride: 111 mmol/L (ref 98–111)
Creatinine, Ser: 1.2 mg/dL (ref 0.61–1.24)
GFR, Estimated: 57 mL/min — ABNORMAL LOW (ref >60)
Glucose, Bld: 93 mg/dL (ref 70–99)
Potassium: 4.3 mmol/L (ref 3.5–5.1)
Sodium: 137 mmol/L (ref 135–145)

## 2022-05-10 MED ORDER — TRAMADOL HCL 50 MG PO TABS: 50.0000 mg | ORAL_TABLET | Freq: Four times a day (QID) | ORAL | 0 refills | Status: DC | PRN

## 2022-05-10 MED ORDER — LIDOCAINE 5 % EX PTCH: 1.0000 | MEDICATED_PATCH | CUTANEOUS | 0 refills | Status: AC

## 2022-05-10 NOTE — Progress Notes (Signed)
Physical Therapy Treatment Patient Details Name: Nathaniel Woods MRN: [id number] DOB: 03-08-1929 Today's Date: 05/10/2022   History of Present Illness Mandela Bello is a 53yoM who comes to [facility] on 05/08/22 after 2 falls at home. PMH: HTN, DM2, CAD status post CABG, AF on Eliquis, AAA status post endovascular repair, stroke, CKD, and CHF. Baseline level includes household AMB c RW, active with therapy for PT/OT RN at present.    PT Comments    Author makes several treatment attempts to catch pt both awake and done with meal. Son Richardson Landry in room at time of visit. Pt appears pleasant and comfortable. He ate breakfast which he enjoyed. He is agreeable to getting OOB to left EOB, rather than caudal scoot off FOB. He requires modA to rise from standard surface height, but is able to rise unassisted from elevated surface which is a vast improvement in ability compared to previous day. Pt is able to AMB twice without LOB, ~57f each, but unable to go farther. [facility] and is elated to finally have a BM after 7 days without. Pt elects to go back to bed after session, positioned upright in bed, made comfortable, all needs met.    Recommendations for follow up therapy are one component of a multi-disciplinary discharge planning process, led by the attending physician.  Recommendations may be updated based on patient status, additional functional criteria and insurance authorization.  Follow Up Recommendations  Home health PT     Assistance Recommended at Discharge Intermittent Supervision/Assistance  Patient can return home with the following A lot of help with walking and/or transfers;A lot of help with bathing/dressing/bathroom;Help with stairs or ramp for entrance;Assist for transportation;Assistance with cooking/housework;Direct supervision/assist for medications management   Equipment Recommendations  None recommended by PT    Recommendations for Other Services        Precautions / Restrictions Precautions Precautions: Fall;Back Precaution Booklet Issued: No Restrictions Weight Bearing Restrictions: No     Mobility  Bed Mobility   Bed Mobility: Supine to Sit, Sit to Supine     Supine to sit: Max assist Sit to supine: Max assist, +2 for physical assistance   General bed mobility comments: this time, pt agreeable to moving from recliner to left EOB with assist.    Transfers Overall transfer level: Needs assistance Equipment used: Rolling walker (2 wheels) Transfers: Sit to/from Stand Sit to Stand: Mod assist           General transfer comment: can perform with minGuard to minA from elevated EOB (~23") or elevated BSC    Ambulation/Gait   Gait Distance (Feet): 20 Feet (252ftwice) Assistive device: Rolling walker (2 wheels) Gait Pattern/deviations: WFL(Within Functional Limits)       General Gait Details: no LOB, improved confidence with obtaining balance standing,   Stairs             Wheelchair Mobility    Modified Rankin (Stroke Patients Only)       Balance                                            Cognition Arousal/Alertness: Awake/alert Behavior During Therapy: WFL for tasks assessed/performed Overall Cognitive Status: Within Functional Limits for tasks assessed  General Comments: much more calm, agreeble to alternative mobility techiques, agreeable to assistance, pain appears more well controlled throughout session.        Exercises      General Comments        Pertinent Vitals/Pain Pain Assessment Pain Assessment: Faces Faces Pain Scale: Hurts little more Pain Location: central lumbar spine Pain Intervention(s): Limited activity within patient's tolerance, Monitored during session, Premedicated before session    Home Living                          Prior Function            PT Goals (current goals can now be  found in the care plan section) Acute Rehab PT Goals Patient Stated Goal: get patient home as soon as possible once he can walk PT Goal Formulation: With patient Time For Goal Achievement: 05/23/22 Potential to Achieve Goals: Good Progress towards PT goals: Progressing toward goals    Frequency    Min 2X/week      PT Plan Current plan remains appropriate    Co-evaluation              AM-PAC PT "6 Clicks" Mobility   Outcome Measure  Help needed turning from your back to your side while in a flat bed without using bedrails?: Total Help needed moving from lying on your back to sitting on the side of a flat bed without using bedrails?: Total Help needed moving to and from a bed to a chair (including a wheelchair)?: A Lot Help needed standing up from a chair using your arms (e.g., wheelchair or bedside chair)?: A Lot Help needed to walk in hospital room?: A Little Help needed climbing 3-5 steps with a railing? : Total 6 Click Score: 10    End of Session Equipment Utilized During Treatment: Gait belt Activity Tolerance: Patient tolerated treatment well;Patient limited by pain;Patient limited by fatigue Patient left: with family/visitor present;with call bell/phone within reach;in bed Nurse Communication: Mobility status;Patient requests pain meds PT Visit Diagnosis: Other abnormalities of gait and mobility (R26.89);Difficulty in walking, not elsewhere classified (R26.2)     Time: 1517-6160 PT Time Calculation (min) (ACUTE ONLY): 34 min  Charges:  $Therapeutic Activity: 23-37 mins                    10:52 AM, 05/10/22 Etta Grandchild, PT, DPT Physical Therapist - [facility]  [phone] ([place])   Zayvier Caravello C 05/10/2022, 10:48 AM

## 2022-05-10 NOTE — Plan of Care (Signed)
  Problem: Health Behavior/Discharge Planning: Goal: Ability to manage health-related needs will improve Outcome: Progressing   Problem: Clinical Measurements: Goal: Ability to maintain clinical measurements within normal limits will improve Outcome: Progressing   Problem: Clinical Measurements: Goal: Diagnostic test results will improve Outcome: Progressing   Problem: Clinical Measurements: Goal: Cardiovascular complication will be avoided Outcome: Progressing   Problem: Activity: Goal: Risk for activity intolerance will decrease Outcome: Progressing   Problem: Nutrition: Goal: Adequate nutrition will be maintained Outcome: Progressing   Problem: Coping: Goal: Level of anxiety will decrease Outcome: Progressing   Problem: Elimination: Goal: Will not experience complications related to bowel motility Outcome: Progressing   Problem: Pain Managment: Goal: General experience of comfort will improve Outcome: Progressing   Problem: Safety: Goal: Ability to remain free from injury will improve Outcome: Progressing   Problem: Skin Integrity: Goal: Risk for impaired skin integrity will decrease Outcome: Progressing

## 2022-05-10 NOTE — TOC Progression Note (Signed)
Transition of Care [facility]) - Progression Note    Patient Details  Name: Nathaniel Woods MRN: [id number] Date of Birth: [birth year]  Transition of Care [facility]) CM/SW Contact  Izola Price, RN Phone Number: 05/10/2022, 1:59 PM  Clinical Narrative:  05/10/22: Damaris Schooner with son regarding discharge orders in for today and [facility] recommendations. Spoke with son [name]. Patient has been active with [facility] for several months prior to this admission. Family is hiring nursing care 7 days/week for both parents. States patient is too sleepy to hydrate and become deydrated, then weak and confused. Has needed equipment. [name] was unaware of discharge plan for today and is on his way to the hospital now. He wishes to speak wth provider and be present during an afternoon PT session as he feels patient is very [place] and needs more encouragement with PT to see what he can do. Updated provider and requested resumption of [place] services order. Son has all [facility]'s contact information. Simmie Davies RN CM           Expected Discharge Plan and Services           Expected Discharge Date: 05/10/22                           [facility] Agency: [facility] (Current with [facility] for last 2-3 months. Requested resumption orders.)         Social Determinants of Health (SDOH) Interventions    Readmission Risk Interventions     No data to display

## 2022-05-10 NOTE — Discharge Summary (Addendum)
Physician Discharge Summary   Patient: Nathaniel Woods MRN: [id number] DOB: 1929/04/19  Admit date:     05/08/2022  Discharge date: 05/10/22  Discharge Physician: Ezekiel Slocumb   PCP: Derinda Late, MD   Recommendations at discharge:   Follow up with Primary Care in 1 week Repeat BMP, CBC in 1 week Ensure adequate hydration, frequently encourage PO intake  Discharge Diagnoses: Principal Problem:   Recurrent falls Active Problems:   Paroxysmal atrial fibrillation (HCC)   Type II diabetes mellitus ([place])   Dyslipidemia   Coronary artery disease   AICD (automatic cardioverter/defibrillator) present   Asthma, chronic   BPH (benign prostatic hyperplasia)   Depression   Essential hypertension   Acute midline low back pain without sciatica   Generalized weakness  Resolved Problems:   Acute renal failure superimposed on stage 3a chronic kidney disease ([place])   Hyponatremia  Hospital Course: NAFTOLI PENNY is a 88 y.o. Caucasian male with medical history significant for asthma, BPH, CAD, chronic systolic CHF, stage IIIa CKD, hypertension, dyslipidemia, AAA status post endovascular repair, paroxysmal atrial fibrillation on Eliquis, type 2 diabetes mellitus and CVA, presented to the ER on 05/08/2022 for evaluation of recurrent falls.  He reportedly had 2 falls yesterday at home.  He had a fall last month on 10/24 and work-up here in the ER was negative then.  When his son went to check on him, he was having difficulty mobilizing and felt much weaker than usual, prompting ED visit for evaluation.  ED evaluation was essentially unremarkable.  Vitals were stable, afebrile.  Labs notable only for mild hyponatremia (Na 132), BUN 27 and Cr 1.32 (similar to prior).  Hs-troponin was mildly elevated without acute ischemic EKG changes or active chest pain.  Extensive imaging was negative for acute injuries, did show chronic compression deformities of T10 and L1-L5.    Admitted to hospital for  further evaluation and management. PT/OT consults due to recurrent falls.  MRI brain was planned to assess for vertebrobasilar insufficiency, but patient has an AICD in place, therefore unable to obtain MRI.        12/2 -- Renal function and electrolytes stable.  Patient is medically ready for discharge home with home health PT to continue working on strength and safety with mobility.  Recommend close primary care follow up for repeat labs.    Patient has history of recurrent falls due to generalized weakness and debility related to advanced age, prone to dehydration and chronic pain with multiple chronic compression fractures.   He is high risk for re-admission and further morbidity related to same.  However, there is currently no medical indication to remain in the hospital at this time.   Assessment and Plan: * Recurrent falls Acute on chronic mid low back pain and generalized weakness.  Radiographic studies have been unremarkable for acute injury. --PT evaluation --Fall precautions --Pain control for back pain, minimize sedating meds as much as possible --Family will decline SNF, citing he "would not survive it", agreeable to [facility] for rehab.  They are arranging full time caregiver support.  Hyponatremia-resolved as of 05/10/2022 Likely hypovolemia. Pt's son reports issues with patient getting dehydrated frequently. Na improving with IV fluids. --Continue IV fluids --Monitor BMP  Paroxysmal atrial fibrillation (HCC) Rate controlled. Continue Eliquis and metoprolol.  Type II diabetes mellitus (HCC) Sliding scale Novolog  Essential hypertension - We will continue his Toprol-XL.  Depression - We will continue as Celexa and Remeron  BPH (benign prostatic hyperplasia) -  We will continue as Proscar  Asthma, chronic - We will continue his Singulair and place him on as needed DuoNebs.  Coronary artery disease - We will continue his Toprol-XL and statin therapy.  Dyslipidemia -  We will continue statin therapy. - We will check fasting lipids.         Consultants: none Procedures performed: none  Disposition: Home health Diet recommendation:  Regular diet DISCHARGE MEDICATION: Allergies as of 05/10/2022       Reactions   Levofloxacin Rash   Ambien [zolpidem Tartrate] Other (See Comments)   Hallucinations and per pt he walked around at night and doesn't remember.   Aspirin Other (See Comments)   Hemorrhagic Stroke   Bee Venom Other (See Comments)   Reaction: fever   Chlorpheniramine Other (See Comments)   Reaction: unknown   Decongestant [pseudoephedrine Hcl] Other (See Comments)   Reaction: unknown   Simvastatin Other (See Comments)   Other reaction(s): Muscle Pain   Zolpidem Other (See Comments)   Other reaction(s): Unknown "went bananas"   Contrast Media [iodinated Contrast Media] Rash        Medication List     STOP taking these medications    furosemide 20 MG tablet Commonly known as: LASIX   mirtazapine 15 MG tablet Commonly known as: REMERON   spironolactone 25 MG tablet Commonly known as: ALDACTONE       TAKE these medications    acetaminophen 325 MG tablet Commonly known as: TYLENOL Take 2 tablets (650 mg total) by mouth every 6 (six) hours as needed for mild pain (or Fever >/= 101).   amitriptyline 50 MG tablet Commonly known as: ELAVIL Take 50 mg by mouth at bedtime.   apixaban 5 MG Tabs tablet Commonly known as: ELIQUIS Take 5 mg by mouth 2 (two) times daily.   Calcium 600+D3 Plus Minerals 600-800 MG-UNIT Tabs Take 1 tablet by mouth every evening.   citalopram 20 MG tablet Commonly known as: CELEXA Take 20 mg by mouth every evening.   CoQ10 200 MG Caps Take 200 mg by mouth every evening.   finasteride 5 MG tablet Commonly known as: PROSCAR Take 5 mg by mouth every evening.   latanoprost 0.005 % ophthalmic solution Commonly known as: XALATAN Place 1 drop into both eyes at bedtime.   lidocaine 5  % Commonly known as: LIDODERM Place 1 patch onto the skin daily. Remove & Discard patch within 12 hours or as directed by MD   metoprolol succinate 25 MG 24 hr tablet Commonly known as: TOPROL-XL Take 25 mg by mouth daily.   montelukast 10 MG tablet Commonly known as: SINGULAIR Take 10 mg by mouth at bedtime.   rosuvastatin 5 MG tablet Commonly known as: CRESTOR Take 5 mg by mouth every Monday, Wednesday, and Friday. (Evening)   traMADol 50 MG tablet Commonly known as: ULTRAM Take 1 tablet (50 mg total) by mouth every 6 (six) hours as needed for moderate pain (despite tylenol).        Discharge Exam: Filed Weights   05/08/22 1615  Weight: 82.8 kg   General exam: sleeping, wakes to voice and tactile stimulus, no acute distress HEENT: moist mucus membranes, very hard of hearing  Respiratory system: CTAB, no wheezes, rales or rhonchi, normal respiratory effort. Cardiovascular system: normal S1/S2,  RRR, no JVD, murmurs, rubs, gallops, no pedal edema.   Gastrointestinal system: soft, NT, ND, no HSM felt, +bowel sounds. Central nervous system: A&O x self. no gross focal neurologic deficits, normal  speech Extremities: moves all, no edema, normal tone Skin: dry, intact, normal temperature, normal color, No rashes, lesions or ulcers Psychiatry: normal mood, congruent affect, judgement and insight appear normal   Condition at discharge: stable  The results of significant diagnostics from this hospitalization (including imaging, microbiology, ancillary and laboratory) are listed below for reference.   Imaging Studies: US Carotid Bilateral  Result Date: 05/10/2022 CLINICAL DATA:  Unsteady gait EXAM: BILATERAL CAROTID DUPLEX ULTRASOUND TECHNIQUE: Pearline Cables scale imaging, color Doppler and duplex ultrasound were performed of bilateral carotid and vertebral arteries in the neck. COMPARISON:  None Available. FINDINGS: Criteria: Quantification of carotid stenosis is based on velocity  parameters that correlate the residual internal carotid diameter with NASCET-based stenosis levels, using the diameter of the distal internal carotid lumen as the denominator for stenosis measurement. The following velocity measurements were obtained: RIGHT ICA: 117/29 cm/sec CCA: 91/47 cm/sec SYSTOLIC ICA/CCA RATIO:  1.8 ECA:  78 cm/sec LEFT ICA: 135/33 cm/sec CCA: 82/95 cm/sec SYSTOLIC ICA/CCA RATIO:  2.2 ECA:  117 cm/sec RIGHT CAROTID ARTERY: Trace heterogeneous atherosclerotic plaque in the proximal internal carotid artery. By peak systolic velocity criteria, the estimated stenosis is less than 50%. RIGHT VERTEBRAL ARTERY:  Patent with normal antegrade flow. LEFT CAROTID ARTERY: Heterogeneous atherosclerotic plaque in the proximal internal carotid artery. By peak systolic velocity criteria, the estimated stenosis is in the 50-69% range. LEFT VERTEBRAL ARTERY:  Patent with normal antegrade flow. IMPRESSION: 1. Mild (1-49%) stenosis proximal right internal carotid artery secondary to heterogenous atherosclerotic plaque. 2. Moderate (50-69%) stenosis proximal left internal carotid artery secondary to heterogenous atherosclerotic plaque. 3. Vertebral arteries are patent with normal antegrade flow. Electronically Signed   By: Jacqulynn Cadet M.D.   On: 05/10/2022 07:30   CT L-SPINE NO CHARGE  Result Date: 05/08/2022 CLINICAL DATA:  Multiple recent falls. EXAM: CT THORACIC AND LUMBAR SPINE WITHOUT CONTRAST TECHNIQUE: Multiplanar CT images of the thoracic and lumbar spine were reconstructed from contemporary CT of the Chest, Abdomen, and Pelvis. RADIATION DOSE REDUCTION: This exam was performed according to the departmental dose-optimization program which includes automated exposure control, adjustment of the mA and/or kV according to patient size and/or use of iterative reconstruction technique. CONTRAST:  None. COMPARISON:  CT abdomen pelvis dated February 23, 2022. FINDINGS: CT THORACIC SPINE FINDINGS  Alignment: Dextroscoliosis.  No traumatic malalignment. Vertebrae: No acute fracture chronic T10 compression deformity is unchanged. Paraspinal and other soft tissues: Please see separate CT chest, abdomen, and pelvis report from same day. Disc levels: Disc heights are relatively preserved. Scattered mild-to-moderate facet arthropathy. CT LUMBAR SPINE FINDINGS Segmentation: Unchanged partial sacralization of L5 on the left. Alignment: No traumatic malalignment. Vertebrae: No acute fracture. Chronic L1 through L5 superior endplate compression deformities are unchanged. Paraspinal and other soft tissues: Please see separate CT chest, abdomen, and pelvis report from same day. Disc levels: Multilevel disc degeneration and advanced facet arthropathy. IMPRESSION: 1. No acute osseous abnormality of the thoracic or lumbar spine. 2. Unchanged T10 and L1 through L5 chronic compression deformities. Electronically Signed   By: Titus Dubin M.D.   On: 05/08/2022 17:13   CT T-SPINE NO CHARGE  Result Date: 05/08/2022 CLINICAL DATA:  Multiple recent falls. EXAM: CT THORACIC AND LUMBAR SPINE WITHOUT CONTRAST TECHNIQUE: Multiplanar CT images of the thoracic and lumbar spine were reconstructed from contemporary CT of the Chest, Abdomen, and Pelvis. RADIATION DOSE REDUCTION: This exam was performed according to the departmental dose-optimization program which includes automated exposure control, adjustment of the mA and/or kV  according to patient size and/or use of iterative reconstruction technique. CONTRAST:  None. COMPARISON:  CT abdomen pelvis dated February 23, 2022. FINDINGS: CT THORACIC SPINE FINDINGS Alignment: Dextroscoliosis.  No traumatic malalignment. Vertebrae: No acute fracture chronic T10 compression deformity is unchanged. Paraspinal and other soft tissues: Please see separate CT chest, abdomen, and pelvis report from same day. Disc levels: Disc heights are relatively preserved. Scattered mild-to-moderate facet  arthropathy. CT LUMBAR SPINE FINDINGS Segmentation: Unchanged partial sacralization of L5 on the left. Alignment: No traumatic malalignment. Vertebrae: No acute fracture. Chronic L1 through L5 superior endplate compression deformities are unchanged. Paraspinal and other soft tissues: Please see separate CT chest, abdomen, and pelvis report from same day. Disc levels: Multilevel disc degeneration and advanced facet arthropathy. IMPRESSION: 1. No acute osseous abnormality of the thoracic or lumbar spine. 2. Unchanged T10 and L1 through L5 chronic compression deformities. Electronically Signed   By: Titus Dubin M.D.   On: 05/08/2022 17:13   DG Knee 2 Views Right  Result Date: 05/08/2022 CLINICAL DATA:  Multiple falls, right knee pain EXAM: RIGHT KNEE - 1-2 VIEW COMPARISON:  None Available. FINDINGS: Frontal and cross-table lateral views of the right knee are obtained on 2 images. The bones are osteopenic. No acute fracture, subluxation, or dislocation. There is mild 3 compartmental osteoarthritis. No joint effusion. Diffuse atherosclerosis. Soft tissues are otherwise unremarkable. IMPRESSION: 1. No acute bony abnormality. 2. Mild 3 compartmental osteoarthritis. 3. Osteopenia. Electronically Signed   By: Randa Ngo M.D.   On: 05/08/2022 17:11   CT CHEST ABDOMEN PELVIS WO CONTRAST  Result Date: 05/08/2022 CLINICAL DATA:  Several falls over last few days. EXAM: CT CHEST, ABDOMEN AND PELVIS WITHOUT CONTRAST TECHNIQUE: Multidetector CT imaging of the chest, abdomen and pelvis was performed following the standard protocol without IV contrast. RADIATION DOSE REDUCTION: This exam was performed according to the departmental dose-optimization program which includes automated exposure control, adjustment of the mA and/or kV according to patient size and/or use of iterative reconstruction technique. COMPARISON:  CT abdomen pelvis dated February 23, 2022. FINDINGS: CT CHEST FINDINGS Cardiovascular: Cardiomegaly.  Prior CABG. No pericardial effusion. No thoracic aortic aneurysm. Coronary, aortic arch, and branch vessel atherosclerotic vascular disease. Left chest wall AICD. Mediastinum/Nodes: No enlarged mediastinal, hilar, or axillary lymph nodes. Thyroid gland, trachea, and esophagus demonstrate no significant findings. Lungs/Pleura: Mild paraseptal emphysema. Increased subpleural reticulation at the lung bases. No focal consolidation, pleural effusion, or pneumothorax. Musculoskeletal: No acute or significant osseous findings. Multiple old bilateral rib fractures. CT ABDOMEN PELVIS FINDINGS Hepatobiliary: No hepatic injury or perihepatic hematoma. Gallbladder is unremarkable. No biliary dilatation. Pancreas: Unremarkable. No pancreatic ductal dilatation or surrounding inflammatory changes. Spleen: No splenic injury or perisplenic hematoma. Adrenals/Urinary Tract: Adrenal glands are unremarkable. Unchanged bilateral renal cysts. No follow-up imaging is recommended. No renal calculi or hydronephrosis. The bladder is unremarkable. Stomach/Bowel: Stomach is within normal limits. History of prior appendectomy. No evidence of bowel wall thickening, distention, or inflammatory changes. Left-sided colonic diverticulosis. Vascular/Lymphatic: Slightly increased infrarenal abdominal aortic aneurysm status post stent grafting, currently measuring 7.8 x 8.5 cm, previously 7.9 x 8.1 cm. Aortoiliac atherosclerotic vascular disease. No enlarged abdominal or pelvic lymph nodes. Reproductive: Prostate is unremarkable. Other: Trace free fluid in the pelvis, nonspecific. No pneumoperitoneum. Musculoskeletal: Multiple chronic thoracic and lumbar vertebral compression deformities are unchanged. Old healed right intertrochanteric femur fracture status post ORIF. IMPRESSION: 1. No acute traumatic injury in the chest, abdomen, or pelvis. 2. Slightly increased infrarenal abdominal aortic aneurysm status post stent  grafting, currently measuring 7.8  x 8.5 cm, previously 7.9 x 8.1 cm. 3. Mild interstitial lung disease. 4. Aortic Atherosclerosis (ICD10-I70.0) and Emphysema (ICD10-J43.9). Electronically Signed   By: Titus Dubin M.D.   On: 05/08/2022 17:06   CT Head Wo Contrast  Result Date: 05/08/2022 CLINICAL DATA:  Head trauma, minor (Age >= 65y); Neck trauma (Age >= 65y) EXAM: CT HEAD WITHOUT CONTRAST CT CERVICAL SPINE WITHOUT CONTRAST TECHNIQUE: Multidetector CT imaging of the head and cervical spine was performed following the standard protocol without intravenous contrast. Multiplanar CT image reconstructions of the cervical spine were also generated. RADIATION DOSE REDUCTION: This exam was performed according to the departmental dose-optimization program which includes automated exposure control, adjustment of the mA and/or kV according to patient size and/or use of iterative reconstruction technique. COMPARISON:  04/01/2022 FINDINGS: CT HEAD FINDINGS Brain: No evidence of acute infarction, hemorrhage, hydrocephalus, extra-axial collection or mass lesion/mass effect. Scattered low-density changes within the periventricular and subcortical white matter compatible with chronic microvascular ischemic change. Moderate diffuse cerebral volume loss. Vascular: Atherosclerotic calcifications involving the large vessels of the skull base. No unexpected hyperdense vessel. Skull: Normal. Negative for fracture or focal lesion. Sinuses/Orbits: No acute finding. Other: Negative for scalp hematoma. CT CERVICAL SPINE FINDINGS Alignment: Facet joints are aligned without dislocation or traumatic listhesis. Dens and lateral masses are aligned. Skull base and vertebrae: No acute fracture. No primary bone lesion or focal pathologic process. Soft tissues and spinal canal: No prevertebral fluid or swelling. No visible canal hematoma. Disc levels:  Multilevel cervical spondylosis, unchanged from prior. Upper chest: Negative. Other: Bilateral carotid atherosclerosis.  IMPRESSION: 1. No acute intracranial abnormality. 2. No acute fracture or subluxation of the cervical spine. Electronically Signed   By: Davina Poke D.O.   On: 05/08/2022 16:58   CT Cervical Spine Wo Contrast  Result Date: 05/08/2022 CLINICAL DATA:  Head trauma, minor (Age >= 65y); Neck trauma (Age >= 65y) EXAM: CT HEAD WITHOUT CONTRAST CT CERVICAL SPINE WITHOUT CONTRAST TECHNIQUE: Multidetector CT imaging of the head and cervical spine was performed following the standard protocol without intravenous contrast. Multiplanar CT image reconstructions of the cervical spine were also generated. RADIATION DOSE REDUCTION: This exam was performed according to the departmental dose-optimization program which includes automated exposure control, adjustment of the mA and/or kV according to patient size and/or use of iterative reconstruction technique. COMPARISON:  04/01/2022 FINDINGS: CT HEAD FINDINGS Brain: No evidence of acute infarction, hemorrhage, hydrocephalus, extra-axial collection or mass lesion/mass effect. Scattered low-density changes within the periventricular and subcortical white matter compatible with chronic microvascular ischemic change. Moderate diffuse cerebral volume loss. Vascular: Atherosclerotic calcifications involving the large vessels of the skull base. No unexpected hyperdense vessel. Skull: Normal. Negative for fracture or focal lesion. Sinuses/Orbits: No acute finding. Other: Negative for scalp hematoma. CT CERVICAL SPINE FINDINGS Alignment: Facet joints are aligned without dislocation or traumatic listhesis. Dens and lateral masses are aligned. Skull base and vertebrae: No acute fracture. No primary bone lesion or focal pathologic process. Soft tissues and spinal canal: No prevertebral fluid or swelling. No visible canal hematoma. Disc levels:  Multilevel cervical spondylosis, unchanged from prior. Upper chest: Negative. Other: Bilateral carotid atherosclerosis. IMPRESSION: 1. No acute  intracranial abnormality. 2. No acute fracture or subluxation of the cervical spine. Electronically Signed   By: Davina Poke D.O.   On: 05/08/2022 16:58    Microbiology: Results for orders placed or performed during the hospital encounter of 04/01/22  Urine Culture     Status:  None   Collection Time: 04/01/22  7:14 AM   Specimen: Urine, Clean Catch  Result Value Ref Range Status   Specimen Description   Final    URINE, CLEAN CATCH Performed at [facility], [address], [place], [place] [address]    Special Requests   Final    NONE Performed at [facility], [address] [place], [place] [address]    Culture   Final    NO GROWTH Performed at [facility], [place] [address], [place], [place] [address]    Report Status 04/02/2022 FINAL  Final  SARS Coronavirus 2 by RT PCR (hospital order, performed in [facility] hospital lab) *cepheid single result test* Anterior Nasal Swab     Status: None   Collection Time: 04/01/22  7:34 AM   Specimen: Anterior Nasal Swab  Result Value Ref Range Status   SARS Coronavirus 2 by RT PCR NEGATIVE NEGATIVE Final    Comment: (NOTE) SARS-CoV-2 target nucleic acids are NOT DETECTED.  The SARS-CoV-2 RNA is generally detectable in upper and lower respiratory specimens during the acute phase of infection. The lowest concentration of SARS-CoV-2 viral copies this assay can detect is 250 copies / mL. A negative result does not preclude SARS-CoV-2 infection and should not be used as the sole basis for treatment or other patient management decisions.  A negative result may occur with improper specimen collection / handling, submission of specimen other than nasopharyngeal swab, presence of viral mutation(s) within the areas targeted by this assay, and inadequate number of viral copies (<250 copies / mL). A negative result must be combined with clinical observations, patient history, and epidemiological information.  Fact  Sheet for Patients:   https://www.patel.info/  Fact Sheet for Healthcare Providers: https://hall.com/  This test is not yet approved or  cleared by the [place] FDA and has been authorized for detection and/or diagnosis of SARS-CoV-2 by FDA under an Emergency Use Authorization (EUA).  This EUA will remain in effect (meaning this test can be used) for the duration of the COVID-19 declaration under Section 564(b)(1) of the Act, 21 U.S.C. section 360bbb-3(b)(1), unless the authorization is terminated or revoked sooner.  Performed at [facility], [address] [place],  [address]     Labs: CBC: Recent Labs  Lab 05/08/22 1621 05/09/22 0558  WBC 8.7 7.7  NEUTROABS 6.3  --   HGB 10.2* 9.4*  HCT 30.8* 29.6*  MCV 102.0* 102.1*  PLT 131* 737*   Basic Metabolic Panel: Recent Labs  Lab 05/08/22 1621 05/09/22 0558 05/10/22 0844  NA 132* 136 137  K 4.1 3.8 4.3  CL 107 112* 111  CO2 22 19* 22  GLUCOSE 124* 89 93  BUN 27* 20 21  CREATININE 1.32* 1.09 1.20  CALCIUM 8.3* 8.3* 8.5*   Liver Function Tests: Recent Labs  Lab 05/08/22 1621  AST 20  ALT 12  ALKPHOS 52  BILITOT 0.8  PROT 6.0*  ALBUMIN 3.4*   CBG: No results for input(s): "GLUCAP" in the last 168 hours.  Discharge time spent: less than 30 minutes.  Signed: Ezekiel Slocumb, DO [facility] 05/10/2022

## 2022-05-10 NOTE — Plan of Care (Signed)
  Problem: Education: Goal: Knowledge of General Education information will improve Description: Including pain rating scale, medication(s)/side effects and non-pharmacologic comfort measures 05/10/2022 0105 by Suzette Battiest, LPN Outcome: Progressing   Problem: Health Behavior/Discharge Planning: Goal: Ability to manage health-related needs will improve 05/10/2022 0105 by Suzette Battiest, LPN Outcome: Progressing   Problem: Clinical Measurements: Goal: Respiratory complications will improve 05/10/2022 0105 by Suzette Battiest, LPN Outcome: Progressing   Problem: Clinical Measurements: Goal: Cardiovascular complication will be avoided 05/10/2022 0105 by Suzette Battiest, LPN Outcome: Progressing   Problem: Nutrition: Goal: Adequate nutrition will be maintained 05/10/2022 0105 by Suzette Battiest, LPN Outcome: Progressing   Problem: Coping: Goal: Level of anxiety will decrease 05/10/2022 0105 by Suzette Battiest, LPN Outcome: Progressing   Problem: Pain Managment: Goal: General experience of comfort will improve 05/10/2022 0105 by Suzette Battiest, LPN Outcome: Progressing   Problem: Safety: Goal: Ability to remain free from injury will improve 05/10/2022 0107 by Suzette Battiest, LPN Outcome: Progressing   Problem: Skin Integrity: Goal: Risk for impaired skin integrity will decrease 05/10/2022 0105 by Suzette Battiest, LPN Outcome: Progressing

## 2022-05-10 NOTE — Progress Notes (Signed)
Physical Therapy Treatment Patient Details Name: Nathaniel Woods MRN: [id number] DOB: May 18, 1929 Today's Date: 05/10/2022   History of Present Illness Nathaniel Woods is a 7yoM who comes to [facility] on 05/08/22 after 2 falls at home. PMH: HTN, DM2, CAD status post CABG, AF on Eliquis, AAA status post endovascular repair, stroke, CKD, and CHF. Baseline level includes household AMB c RW, active with therapy for PT/OT RN at present.    PT Comments    Pt pleasant and willing to participate with PT, medicated for back pain prior to session.  Son present, supportive and eager to see what pt could do.  Ultimately he did quite well needing min/mod assist to get to sitting and then no direct physical assist to get to standing and ultimately ambulate with slow but steady gait (with walker reliance) >100 ft.  Pt on room air t/o session with SpO2 87% on arrival at rest, up to low 90s with cued purposeful breathing but fluctuating in the the high 80s most of the time (including during ambulation) with occasional drops to mid 80s w/o excessive DOE/SOB.    Recommendations for follow up therapy are one component of a multi-disciplinary discharge planning process, led by the attending physician.  Recommendations may be updated based on patient status, additional functional criteria and insurance authorization.  Follow Up Recommendations  Home health PT     Assistance Recommended at Discharge Intermittent Supervision/Assistance  Patient can return home with the following Help with stairs or ramp for entrance;Assist for transportation;Assistance with cooking/housework;Direct supervision/assist for medications management;A little help with walking and/or transfers;A little help with bathing/dressing/bathroom   Equipment Recommendations  None recommended by PT    Recommendations for Other Services       Precautions / Restrictions Precautions Precautions: Fall;Back Restrictions Weight Bearing Restrictions: No      Mobility  Bed Mobility Overal bed mobility: Needs Assistance Bed Mobility: Supine to Sit     Supine to sit: Min assist     General bed mobility comments: Pt able to work LEs off EOB, heavy HHA to initiate upward movement but ultimately able to rise with only light actual assist    Transfers Overall transfer level: Needs assistance Equipment used: Rolling walker (2 wheels) Transfers: Sit to/from Stand Sit to Stand: From elevated surface, Min guard (~3" raised bed)           General transfer comment: extra time to set up/prep, able to appropriate use UEs to push up to standing in walker    Ambulation/Gait Ambulation/Gait assistance: Min guard Gait Distance (Feet): 120 Feet Assistive device: Rolling walker (2 wheels)         General Gait Details: Pt with lack of TKE b/l, mild shuffling/toe drag that did not cause overt safety issues.  Pt's O2 (on room air) 90-84% t/o the effort with improvement when cued to slow and take full breaths through the nose.   Stairs             Wheelchair Mobility    Modified Rankin (Stroke Patients Only)       Balance Overall balance assessment: History of Falls, Needs assistance Sitting-balance support: No upper extremity supported, Feet supported Sitting balance-Leahy Scale: Good     Standing balance support: Reliant on assistive device for balance, During functional activity Standing balance-Leahy Scale: Fair                              Cognition Arousal/Alertness:  Awake/alert Behavior During Therapy: WFL for tasks assessed/performed Overall Cognitive Status: History of cognitive impairments - at baseline                                          Exercises      General Comments        Pertinent Vitals/Pain Pain Assessment Pain Assessment: Faces Faces Pain Scale: Hurts little more (reports as "medium")    Home Living                          Prior Function             PT Goals (current goals can now be found in the care plan section) Progress towards PT goals: Progressing toward goals    Frequency    Min 2X/week      PT Plan Current plan remains appropriate    Co-evaluation              AM-PAC PT "6 Clicks" Mobility   Outcome Measure  Help needed turning from your back to your side while in a flat bed without using bedrails?: A Little Help needed moving from lying on your back to sitting on the side of a flat bed without using bedrails?: A Little Help needed moving to and from a bed to a chair (including a wheelchair)?: A Little Help needed standing up from a chair using your arms (e.g., wheelchair or bedside chair)?: A Little Help needed to walk in hospital room?: A Little Help needed climbing 3-5 steps with a railing? : A Lot 6 Click Score: 17    End of Session Equipment Utilized During Treatment: Gait belt Activity Tolerance: Patient tolerated treatment well;Patient limited by pain;Patient limited by fatigue Patient left: in chair;with call bell/phone within reach;with family/visitor present Nurse Communication: Mobility status;Patient requests pain meds PT Visit Diagnosis: Other abnormalities of gait and mobility (R26.89);Difficulty in walking, not elsewhere classified (R26.2)     Time: 4650-3546 PT Time Calculation (min) (ACUTE ONLY): 28 min  Charges:  $Gait Training: 8-22 mins $Therapeutic Activity: 8-22 mins                     Kreg Shropshire, DPT 05/10/2022, 5:13 PM

## 2022-05-11 NOTE — TOC Transition Note (Signed)
Transition of Care [facility]) - CM/SW Discharge Note   Patient Details  Name: Nathaniel Woods MRN: [id number] Date of Birth: 06-20-1928  [facility] CM/SW Contact:  Izola Price, RN Phone Number: 05/11/2022, 12:42 PM   Clinical Narrative: h12/3: Did not DC yesterday but had discharge orders in for today. HH with [facility] and resumption orders in. Patient has all needed DME per son Nathaniel Woods from discussion on 12/2 regarding discharge plan. Patient has good family support in place. Nathaniel Davies RN CM       Final next level of care: [place]     Patient Goals and CMS Choice        Discharge Placement                       Discharge Plan and Services                            [place]: [facility] (Current with [facility] for last 2-3 months. Requested resumption orders.)        Social Determinants of Health (SDOH) Interventions     Readmission Risk Interventions     No data to display

## 2022-05-11 NOTE — Progress Notes (Signed)
Physical Therapy Treatment Patient Details Name: Nathaniel Woods MRN: [id number] DOB: [birth year] Today's Date: 05/11/2022   History of Present Illness Nathaniel Woods is a 3yoM who comes to [facility] on 05/08/22 after 2 falls at home. PMH: HTN, DM2, CAD status post CABG, AF on Eliquis, AAA status post endovascular repair, stroke, CKD, and CHF. Baseline level includes household AMB c RW, active with therapy for PT/OT RN at present.    PT Comments    Pt in chair asking to get back to bed.  He is able to stand with mod a x 1 from lower height recliner and agrees to gait in hallway but self limits to door and back.  He has slow but steady gait.  Knees a bit flexed but no LOB or buckling.   Returns to bed with mod a x 1 for LE's and does grimace some due to LBP.  In bed with needs met.   Recommendations for follow up therapy are one component of a multi-disciplinary discharge planning process, led by the attending physician.  Recommendations may be updated based on patient status, additional functional criteria and insurance authorization.  Follow Up Recommendations  Home health PT     Assistance Recommended at Discharge Intermittent Supervision/Assistance  Patient can return home with the following Help with stairs or ramp for entrance;Assist for transportation;Assistance with cooking/housework;Direct supervision/assist for medications management;A little help with walking and/or transfers;A little help with bathing/dressing/bathroom   Equipment Recommendations  None recommended by PT    Recommendations for Other Services       Precautions / Restrictions Precautions Precautions: Fall;Back Precaution Booklet Issued: No Restrictions Weight Bearing Restrictions: No     Mobility  Bed Mobility Overal bed mobility: Needs Assistance Bed Mobility: Sit to Supine       Sit to supine: Min assist        Transfers Overall transfer level: Needs assistance Equipment used: Rolling walker (2  wheels) Transfers: Sit to/from Stand Sit to Stand: Mod assist           General transfer comment: extra help from lower height chair    Ambulation/Gait Ambulation/Gait assistance: Min guard Gait Distance (Feet): 30 Feet Assistive device: Rolling walker (2 wheels) Gait Pattern/deviations: WFL(Within Functional Limits) Gait velocity: decreased     General Gait Details: slow but steady gait.  knees a bit flexed but no LOB or buckling.  self limited as he wanted to return to bed.   Stairs             Wheelchair Mobility    Modified Rankin (Stroke Patients Only)       Balance Overall balance assessment: History of Falls, Needs assistance Sitting-balance support: No upper extremity supported, Feet supported Sitting balance-Leahy Scale: Good     Standing balance support: Reliant on assistive device for balance, During functional activity Standing balance-Leahy Scale: Fair                              Cognition Arousal/Alertness: Awake/alert Behavior During Therapy: WFL for tasks assessed/performed Overall Cognitive Status: History of cognitive impairments - at baseline                                          Exercises      General Comments        Pertinent Vitals/Pain Pain Assessment Pain  Assessment: Faces Faces Pain Scale: Hurts even more Pain Location: central lumbar spine Pain Descriptors / Indicators: Sore Pain Intervention(s): Limited activity within patient's tolerance, Monitored during session, Premedicated before session, Repositioned    Home Living                          Prior Function            PT Goals (current goals can now be found in the care plan section) Progress towards PT goals: Progressing toward goals    Frequency    Min 2X/week      PT Plan Current plan remains appropriate    Co-evaluation              AM-PAC PT "6 Clicks" Mobility   Outcome Measure  Help needed  turning from your back to your side while in a flat bed without using bedrails?: A Little Help needed moving from lying on your back to sitting on the side of a flat bed without using bedrails?: A Little Help needed moving to and from a bed to a chair (including a wheelchair)?: A Little Help needed standing up from a chair using your arms (e.g., wheelchair or bedside chair)?: A Little Help needed to walk in hospital room?: A Little Help needed climbing 3-5 steps with a railing? : A Lot 6 Click Score: 17    End of Session Equipment Utilized During Treatment: Gait belt Activity Tolerance: Patient tolerated treatment well;Patient limited by pain;Patient limited by fatigue Patient left: in chair;with call bell/phone within reach;with family/visitor present Nurse Communication: Mobility status;Patient requests pain meds PT Visit Diagnosis: Other abnormalities of gait and mobility (R26.89);Difficulty in walking, not elsewhere classified (R26.2)     Time: 3267-1245 PT Time Calculation (min) (ACUTE ONLY): 11 min  Charges:  $Gait Training: 8-22 mins                   Chesley Noon, PTA 05/11/22, 11:36 AM

## 2022-05-11 NOTE — Progress Notes (Signed)
Brief progress note  Patient was discharged and medically stable yesterday, but family did not bring patient home. Patient seen on rounds this morning, and he denied any acute complaints.  He was seen a second time yesterday by PT, he ambulated 120'. Noted to have brief O2 sats in upper 80's with activity, recovers with rest. CT chest from admission reviewed, no signs of acute pathology including pneumonia or pulmonary edema, but does have mild interstitial lung disease.  This is chronic finding.    Today, patient remains medically stable for discharge.   No charge.

## 2022-05-12 ENCOUNTER — Inpatient Hospital Stay (HOSPITAL_COMMUNITY): Admit: 2022-05-12 | Payer: PPO | Admitting: Internal Medicine

## 2022-05-12 ENCOUNTER — Emergency Department: Payer: PPO

## 2022-05-12 ENCOUNTER — Emergency Department
Admission: EM | Admit: 2022-05-12 | Discharge: 2022-05-13 | Disposition: A | Discharge status: Other Acute Inpatient Hospital | Payer: PPO | Attending: Emergency Medicine | Admitting: Emergency Medicine

## 2022-05-12 ENCOUNTER — Other Ambulatory Visit: Payer: Self-pay

## 2022-05-12 ENCOUNTER — Encounter (HOSPITAL_COMMUNITY): Payer: Self-pay

## 2022-05-12 ENCOUNTER — Encounter: Payer: Self-pay | Admitting: Emergency Medicine

## 2022-05-12 DIAGNOSIS — I2510 Atherosclerotic heart disease of native coronary artery without angina pectoris: Secondary | ICD-10-CM | POA: Diagnosis not present

## 2022-05-12 DIAGNOSIS — R600 Localized edema: Secondary | ICD-10-CM | POA: Diagnosis not present

## 2022-05-12 DIAGNOSIS — M47812 Spondylosis without myelopathy or radiculopathy, cervical region: Secondary | ICD-10-CM | POA: Diagnosis not present

## 2022-05-12 DIAGNOSIS — S61412A Laceration without foreign body of left hand, initial encounter: Secondary | ICD-10-CM | POA: Diagnosis not present

## 2022-05-12 DIAGNOSIS — W19XXXA Unspecified fall, initial encounter: Secondary | ICD-10-CM | POA: Diagnosis not present

## 2022-05-12 DIAGNOSIS — Z955 Presence of coronary angioplasty implant and graft: Secondary | ICD-10-CM | POA: Diagnosis not present

## 2022-05-12 DIAGNOSIS — S0990XA Unspecified injury of head, initial encounter: Secondary | ICD-10-CM | POA: Diagnosis not present

## 2022-05-12 DIAGNOSIS — E1165 Type 2 diabetes mellitus with hyperglycemia: Secondary | ICD-10-CM | POA: Diagnosis not present

## 2022-05-12 DIAGNOSIS — Z951 Presence of aortocoronary bypass graft: Secondary | ICD-10-CM | POA: Diagnosis not present

## 2022-05-12 DIAGNOSIS — Z7951 Long term (current) use of inhaled steroids: Secondary | ICD-10-CM | POA: Diagnosis not present

## 2022-05-12 DIAGNOSIS — Z7901 Long term (current) use of anticoagulants: Secondary | ICD-10-CM | POA: Insufficient documentation

## 2022-05-12 DIAGNOSIS — I110 Hypertensive heart disease with heart failure: Secondary | ICD-10-CM | POA: Diagnosis not present

## 2022-05-12 DIAGNOSIS — J45909 Unspecified asthma, uncomplicated: Secondary | ICD-10-CM | POA: Diagnosis not present

## 2022-05-12 DIAGNOSIS — I5022 Chronic systolic (congestive) heart failure: Secondary | ICD-10-CM | POA: Insufficient documentation

## 2022-05-12 DIAGNOSIS — M79642 Pain in left hand: Secondary | ICD-10-CM | POA: Diagnosis not present

## 2022-05-12 DIAGNOSIS — F0390 Unspecified dementia without behavioral disturbance: Secondary | ICD-10-CM | POA: Diagnosis not present

## 2022-05-12 DIAGNOSIS — I10 Essential (primary) hypertension: Secondary | ICD-10-CM | POA: Diagnosis not present

## 2022-05-12 DIAGNOSIS — Z85828 Personal history of other malignant neoplasm of skin: Secondary | ICD-10-CM | POA: Diagnosis not present

## 2022-05-12 DIAGNOSIS — S82201A Unspecified fracture of shaft of right tibia, initial encounter for closed fracture: Secondary | ICD-10-CM | POA: Diagnosis not present

## 2022-05-12 DIAGNOSIS — Y92009 Unspecified place in unspecified non-institutional (private) residence as the place of occurrence of the external cause: Secondary | ICD-10-CM | POA: Diagnosis not present

## 2022-05-12 DIAGNOSIS — Z79899 Other long term (current) drug therapy: Secondary | ICD-10-CM | POA: Insufficient documentation

## 2022-05-12 DIAGNOSIS — M25551 Pain in right hip: Secondary | ICD-10-CM | POA: Diagnosis not present

## 2022-05-12 DIAGNOSIS — Z23 Encounter for immunization: Secondary | ICD-10-CM | POA: Diagnosis not present

## 2022-05-12 DIAGNOSIS — Z950 Presence of cardiac pacemaker: Secondary | ICD-10-CM | POA: Diagnosis not present

## 2022-05-12 DIAGNOSIS — Z043 Encounter for examination and observation following other accident: Secondary | ICD-10-CM | POA: Diagnosis not present

## 2022-05-12 DIAGNOSIS — S82191S Other fracture of upper end of right tibia, sequela: Secondary | ICD-10-CM | POA: Diagnosis not present

## 2022-05-12 DIAGNOSIS — M25561 Pain in right knee: Secondary | ICD-10-CM | POA: Diagnosis not present

## 2022-05-12 DIAGNOSIS — I480 Paroxysmal atrial fibrillation: Secondary | ICD-10-CM | POA: Diagnosis not present

## 2022-05-12 DIAGNOSIS — Z87891 Personal history of nicotine dependence: Secondary | ICD-10-CM | POA: Insufficient documentation

## 2022-05-12 LAB — BASIC METABOLIC PANEL
Anion gap: 6 (ref 5–15)
BUN: 19 mg/dL (ref 8–23)
CO2: 20 mmol/L — ABNORMAL LOW (ref 22–32)
Calcium: 8.7 mg/dL — ABNORMAL LOW (ref 8.9–10.3)
Chloride: 109 mmol/L (ref 98–111)
Creatinine, Ser: 1.24 mg/dL (ref 0.61–1.24)
GFR, Estimated: 55 mL/min — ABNORMAL LOW (ref >60)
Glucose, Bld: 128 mg/dL — ABNORMAL HIGH (ref 70–99)
Potassium: 4.5 mmol/L (ref 3.5–5.1)
Sodium: 135 mmol/L (ref 135–145)

## 2022-05-12 LAB — CBC WITH DIFFERENTIAL/PLATELET
Abs Immature Granulocytes: 0.09 10*3/uL — ABNORMAL HIGH (ref 0.00–0.07)
Basophils Absolute: 0.1 10*3/uL (ref 0.0–0.1)
Basophils Relative: 1 %
Eosinophils Absolute: 0.1 10*3/uL (ref 0.0–0.5)
Eosinophils Relative: 1 %
HCT: 29.7 % — ABNORMAL LOW (ref 39.0–52.0)
Hemoglobin: 9.5 g/dL — ABNORMAL LOW (ref 13.0–17.0)
Immature Granulocytes: 1 %
Lymphocytes Relative: 7 %
Lymphs Abs: 0.8 10*3/uL (ref 0.7–4.0)
MCH: 32.5 pg (ref 26.0–34.0)
MCHC: 32 g/dL (ref 30.0–36.0)
MCV: 101.7 fL — ABNORMAL HIGH (ref 80.0–100.0)
Monocytes Absolute: 0.9 10*3/uL (ref 0.1–1.0)
Monocytes Relative: 8 %
Neutro Abs: 9.1 10*3/uL — ABNORMAL HIGH (ref 1.7–7.7)
Neutrophils Relative %: 82 %
Platelets: 166 10*3/uL (ref 150–400)
RBC: 2.92 MIL/uL — ABNORMAL LOW (ref 4.22–5.81)
RDW: 14 % (ref 11.5–15.5)
WBC: 11.1 10*3/uL — ABNORMAL HIGH (ref 4.0–10.5)
nRBC: 0 % (ref 0.0–0.2)

## 2022-05-12 LAB — PROTIME-INR
INR: 1.9 — ABNORMAL HIGH (ref 0.8–1.2)
Prothrombin Time: 21.7 seconds — ABNORMAL HIGH (ref 11.4–15.2)

## 2022-05-12 LAB — TROPONIN I (HIGH SENSITIVITY): Troponin I (High Sensitivity): 35 ng/L — ABNORMAL HIGH (ref <18)

## 2022-05-12 MED ORDER — SODIUM CHLORIDE 0.9 % IV SOLN: INTRAVENOUS | Status: DC

## 2022-05-12 MED ORDER — HYDROMORPHONE HCL 1 MG/ML IJ SOLN
0.5000 mg | INTRAMUSCULAR | Status: DC | PRN
  Administered 2022-05-12 – 2022-05-13 (×5): 0.5 mg via INTRAVENOUS
  Filled 2022-05-12 (×5): qty 0.5

## 2022-05-12 MED ORDER — BACITRACIN ZINC 500 UNIT/GM EX OINT
TOPICAL_OINTMENT | Freq: Once | CUTANEOUS | Status: AC
  Filled 2022-05-12: qty 0.9

## 2022-05-12 MED ORDER — TETANUS-DIPHTH-ACELL PERTUSSIS 5-2.5-18.5 LF-MCG/0.5 IM SUSY
0.5000 mL | PREFILLED_SYRINGE | Freq: Once | INTRAMUSCULAR | Status: AC
  Administered 2022-05-12: 0.5 mL via INTRAMUSCULAR
  Filled 2022-05-12: qty 0.5

## 2022-05-12 MED ORDER — ROSUVASTATIN CALCIUM 5 MG PO TABS
5.0000 mg | ORAL_TABLET | ORAL | Status: DC
  Administered 2022-05-13: 5 mg via ORAL
  Filled 2022-05-12 (×2): qty 1

## 2022-05-12 MED ORDER — ROSUVASTATIN CALCIUM 5 MG PO TABS: 5.0000 mg | ORAL_TABLET | ORAL | Status: DC

## 2022-05-12 MED ORDER — SODIUM CHLORIDE 0.9 % IV BOLUS
250.0000 mL | Freq: Once | INTRAVENOUS | Status: AC
  Administered 2022-05-12: 250 mL via INTRAVENOUS

## 2022-05-12 MED ORDER — METOPROLOL SUCCINATE ER 50 MG PO TB24
25.0000 mg | ORAL_TABLET | Freq: Every day | ORAL | Status: DC
  Administered 2022-05-13: 25 mg via ORAL
  Filled 2022-05-12 (×2): qty 1

## 2022-05-12 MED ORDER — ACETAMINOPHEN 325 MG PO TABS: 650.0000 mg | ORAL_TABLET | Freq: Four times a day (QID) | ORAL | Status: DC | PRN

## 2022-05-12 MED ORDER — MONTELUKAST SODIUM 10 MG PO TABS
10.0000 mg | ORAL_TABLET | Freq: Every day | ORAL | Status: DC
  Administered 2022-05-12: 10 mg via ORAL
  Filled 2022-05-12: qty 1

## 2022-05-12 MED ORDER — CITALOPRAM HYDROBROMIDE 20 MG PO TABS
20.0000 mg | ORAL_TABLET | Freq: Every day | ORAL | Status: DC
  Administered 2022-05-12: 20 mg via ORAL
  Filled 2022-05-12: qty 1

## 2022-05-12 MED ORDER — DEXTROSE-NACL 5-0.9 % IV SOLN: INTRAVENOUS | Status: DC

## 2022-05-12 MED ORDER — HYDROMORPHONE HCL 1 MG/ML IJ SOLN
0.5000 mg | Freq: Once | INTRAMUSCULAR | Status: AC
  Administered 2022-05-12: 0.5 mg via INTRAVENOUS
  Filled 2022-05-12: qty 0.5

## 2022-05-12 MED ORDER — FENTANYL CITRATE PF 50 MCG/ML IJ SOSY
50.0000 ug | PREFILLED_SYRINGE | Freq: Once | INTRAMUSCULAR | Status: AC
  Administered 2022-05-12: 50 ug via INTRAVENOUS
  Filled 2022-05-12: qty 1

## 2022-05-12 MED ORDER — ONDANSETRON HCL 4 MG/2ML IJ SOLN
4.0000 mg | Freq: Once | INTRAMUSCULAR | Status: AC
  Administered 2022-05-12: 4 mg via INTRAVENOUS
  Filled 2022-05-12: qty 2

## 2022-05-12 MED ORDER — FINASTERIDE 5 MG PO TABS
5.0000 mg | ORAL_TABLET | Freq: Every evening | ORAL | Status: DC
  Filled 2022-05-12 (×2): qty 1

## 2022-05-12 MED ORDER — AMITRIPTYLINE HCL 50 MG PO TABS
50.0000 mg | ORAL_TABLET | Freq: Every day | ORAL | Status: DC
  Administered 2022-05-12: 50 mg via ORAL
  Filled 2022-05-12: qty 1

## 2022-05-12 NOTE — ED Notes (Signed)
Spoke to [place], Therapist, sports about status of patient being transferred to [facility]. Lorriane Shire to have EDP to speak with family member in regards to patient care.

## 2022-05-12 NOTE — ED Provider Notes (Addendum)
Patient signed out to me pending imaging and labs.  This 88 year old male who is on Eliquis for A-fib presents after a fall.  Presents with a deformity of the right knee.  X-ray shows a proximal tibial metadiaphyseal fracture with mild angulation.  I examined the patient is calf is quite swollen but the compartment is still soft.  Unable to palpate or Doppler pulses in either of his extremities but the feet feel similar in temperature and cap refill is normal.  CT head and C-spine are negative chest x-ray is clear x-ray of the hip is normal.  Discussed with Dr. Posey Pronto with orthopedics recommends transfer.  Discussed with PA Hilbert Odor over at [facility] who is agreeable to transfer.  Recommends placing the patient in a long-leg splint and keeping the leg elevated.  I have spoken with [facility] who accepts the patient.  Currently there is no bed availability.  [facility] says it could be 8 to 12 hours.   I have asked Dr. Posey Pronto with our orthopedics to evaluate the patient.  I am concerned about potential for compartment syndrome and would like him to be evaluated by an orthopedist given transfer is expected to be delayed.  Patient seen by Dr. Posey Pronto with orthopedics.  He is not concerned for compartment syndrome at this time.  Rada Hay, MD 05/12/22 1610       Rada Hay, MD 05/12/22 [phone]

## 2022-05-12 NOTE — ED Notes (Signed)
Neomia Glass, MD paged per sons request

## 2022-05-12 NOTE — ED Provider Notes (Signed)
Recheck compartments, minimal pain to passive stretch, sensation intact, cap refill intact brisk.  Patient not complaining of significant pain.  Awaiting bed to [facility] transfer orthopedics.   Lucillie Garfinkel, MD 05/12/22 2325

## 2022-05-12 NOTE — ED Notes (Signed)
Called [facility] transfer per Jacelyn Grip, MD

## 2022-05-12 NOTE — Consult Note (Signed)
ORTHOPAEDIC CONSULTATION  REQUESTING PHYSICIAN: No att. providers found  Chief Complaint:   Right leg pain History of Present Illness: Nathaniel Woods is a 88 y.o. male with a complex medical history including  chronic kidney disease, CHF status post pacemaker defibrillator, hypertension, hyperlipidemia, A-fib on Eliquis, CVA, diabetes, AAA status post grafting who presented to the emergency department after he fell at home.  Of note he has had multiple recent falls and was admitted to the hospital and just discharged yesterday.  At baseline, he lives with his son and daughter-in-law.  Today he was using his walker and he felt his legs give out.  He noted severe right leg pain.  Of note, he has had a prior right hip fracture.  Past Medical History:  Diagnosis Date   AAA (abdominal aortic aneurysm) without rupture (HCC)    Acute renal failure superimposed on stage 3a chronic kidney disease ([place])    AICD (automatic cardioverter/defibrillator) present 12/17/2016   Angina    Anxiety    "just before major surgery"   Arthritis    Asthma    childhood   Basal cell carcinoma 10/07/2021   left superior ear, Exc 10/15/2021   BPH associated with nocturia    Chronic systolic CHF (congestive heart failure) ([place])    Coronary artery disease    Hemorrhagic stroke ([place]) 09/07/2008   S/P fall   HOH (hard of hearing)    Hyperlipidemia    Hypertension    Hyponatremia 05/08/2022   Inferior MI ([place]) 10/08/2011   Kidney stone    PAF (paroxysmal atrial fibrillation) ([place])    Sinus bradycardia    Stroke ([place])    Type II diabetes mellitus ([place])    Improved with weight loss   Wears hearing aid in both ears    Past Surgical History:  Procedure Laterality Date   ABDOMINAL AORTIC ANEURYSM REPAIR  2013   APPENDECTOMY  06/09/1945   CARDIAC CATHETERIZATION  10/08/2011   CATARACT EXTRACTION W/PHACO Right 11/05/2021   Procedure: CATARACT  EXTRACTION PHACO AND INTRAOCULAR LENS PLACEMENT ([place]) RIGHT;  Surgeon: Birder Robson, MD;  Location: [facility];  Service: Ophthalmology;  Laterality: Right;  6.83 0:45.5   CATARACT EXTRACTION W/PHACO Left 11/19/2021   Procedure: CATARACT EXTRACTION PHACO AND INTRAOCULAR LENS PLACEMENT ([facility]) LEFT 11.02 01:02.8;  Surgeon: Birder Robson, MD;  Location: [facility];  Service: Ophthalmology;  Laterality: Left;   CORONARY ANGIOPLASTY WITH STENT PLACEMENT  06/10/2007   "2; made total of 6"   CORONARY ARTERY BYPASS GRAFT  10/24/2011   Procedure: CORONARY ARTERY BYPASS GRAFTING (CABG);  Surgeon: Ivin Poot, MD;  Location: [facility];  Service: Open Heart Surgery;  Laterality: N/A;  Coronary Artery Bypass Graft times four using the left internal mammary artery and the right and left greater saphenous veins harvested endoscopically.  transesophageal Echocardiogram   ICD IMPLANT  12/17/2016   INTERTROCHANTERIC HIP FRACTURE SURGERY  05/10/1991   "pin, 2 screws, plate"; right   LITHOTRIPSY  ~ 04/2011   TONSILLECTOMY  06/09/1942   VASECTOMY  ~ 1970   Social History   Socioeconomic History   Marital status: Married    Spouse name: Not on file   Number of children: Not on file   Years of education: Not on file   Highest education level: Not on file  Occupational History   Not on file  Tobacco Use   Smoking status: Former    Packs/day: 1.00    Years: 24.00    Total  pack years: 24.00    Types: Cigarettes    Quit date: 06/09/1970    Years since quitting: 51.9   Smokeless tobacco: Never  Vaping Use   Vaping Use: Never used  Substance and Sexual Activity   Alcohol use: Not Currently    Alcohol/week: 28.0 standard drinks of alcohol    Types: 28 Shots of liquor per week    Comment: None for several years   Drug use: No   Sexual activity: Never  Other Topics Concern   Not on file  Social History Narrative   Not on file   Social Determinants of Health   Financial  Resource Strain: Not on file  Food Insecurity: No Food Insecurity (05/09/2022)   Hunger Vital Sign    Worried About Running Out of Food in the Last Year: Never true    Ran Out of Food in the Last Year: Never true  Transportation Needs: No Transportation Needs (05/09/2022)   PRAPARE - Hydrologist (Medical): No    Lack of Transportation (Non-Medical): No  Physical Activity: Not on file  Stress: Not on file  Social Connections: Not on file   Family History  Problem Relation Age of Onset   Breast cancer Sister    Allergies  Allergen Reactions   Levofloxacin Rash   Ambien [Zolpidem Tartrate] Other (See Comments)    Hallucinations and per pt he walked around at night and doesn't remember.   Aspirin Other (See Comments)    Hemorrhagic Stroke   Bee Venom Other (See Comments)    Reaction: fever   Chlorpheniramine Other (See Comments)    Reaction: unknown   Decongestant [Pseudoephedrine Hcl] Other (See Comments)    Reaction: unknown   Simvastatin Other (See Comments)    Other reaction(s): Muscle Pain   Zolpidem Other (See Comments)    Other reaction(s): Unknown "went bananas"   Contrast Media [Iodinated Contrast Media] Rash   Prior to Admission medications   Medication Sig Start Date End Date Taking? Authorizing Provider  amitriptyline (ELAVIL) 50 MG tablet Take 50 mg by mouth at bedtime.   Yes [provider]  apixaban (ELIQUIS) 5 MG TABS tablet Take 5 mg by mouth 2 (two) times daily.    Yes [provider]  citalopram (CELEXA) 20 MG tablet Take 20 mg by mouth every evening.   Yes [provider]  Coenzyme Q10 (COQ10) 200 MG CAPS Take 200 mg by mouth every evening.   Yes [provider]  finasteride (PROSCAR) 5 MG tablet Take 5 mg by mouth every evening.   Yes [provider]  metoprolol succinate (TOPROL-XL) 25 MG 24 hr tablet Take 25 mg by mouth daily.   Yes [provider]  montelukast (SINGULAIR)  10 MG tablet Take 10 mg by mouth at bedtime.    Yes [provider]  rosuvastatin (CRESTOR) 5 MG tablet Take 5 mg by mouth every Monday, Wednesday, and Friday. (Evening)   Yes [provider]  traMADol (ULTRAM) 50 MG tablet Take 1 tablet (50 mg total) by mouth every 6 (six) hours as needed for moderate pain (despite tylenol). 05/10/22  Yes Ezekiel Slocumb, DO  acetaminophen (TYLENOL) 325 MG tablet Take 2 tablets (650 mg total) by mouth every 6 (six) hours as needed for mild pain (or Fever >/= 101). 03/30/19   Nicholes Mango, MD  Calcium Carbonate-Vit D-Min (CALCIUM 600+D3 PLUS MINERALS) 600-800 MG-UNIT TABS Take 1 tablet by mouth every evening.  [provider]  latanoprost (XALATAN) 0.005 % ophthalmic solution Place 1 drop into both eyes at bedtime.  Patient not taking: Reported on 05/08/2022    [provider]  lidocaine (LIDODERM) 5 % Place 1 patch onto the skin daily. Remove & Discard patch within 12 hours or as directed by MD 05/10/22   Ezekiel Slocumb, DO   Recent Labs    05/10/22 [phone] 05/12/22 0724  WBC  --  11.1*  HGB  --  9.5*  HCT  --  29.7*  PLT  --  166  K 4.3 4.5  CL 111 109  CO2 22 20*  BUN 21 19  CREATININE 1.20 1.24  GLUCOSE 93 128*  CALCIUM 8.5* 8.7*  INR  --  1.9*   DG Knee 1-2 Views Right  Result Date: 05/12/2022 CLINICAL DATA:  Right knee pain after unwitnessed fall today. EXAM: RIGHT KNEE - 1-2 VIEW COMPARISON:  Right knee radiographs 05/08/2022 FINDINGS: There is diffuse decreased bone mineralization. Mild motion artifact on the frontal and oblique views. There is a transverse fracture of the proximal tibial metadiaphysis with mild medial apex angulation. Up to 7 mm anterior cortical step-off of the tibial tubercle at the anterior aspect of the fracture. Otherwise, no significant displacement. Evaluation of the joint spaces is limited by obliquity of the views, however there appears to be moderate to severe lateral compartment,  moderate medial compartment, and moderate patellofemoral compartment joint space narrowing. Mild chronic enthesopathic change at the quadriceps and patellar insertions on the patella. No knee joint effusion. Surgical clips overlie the posteromedial distal thigh and knee. Left knee surgical clips are also noted within the partially visualized left lower extremity. Moderate to high-grade atherosclerotic calcifications. IMPRESSION: 1. Acute transverse fracture of the proximal tibial metadiaphysis with mild medial apex angulation. 2. Tricompartmental osteoarthritis, moderate to severe within the lateral compartment. Electronically Signed   By: Yvonne Kendall M.D.   On: 05/12/2022 08:25   DG Chest Portable 1 View  Result Date: 05/12/2022 CLINICAL DATA:  Unwitnessed fall this morning. EXAM: PORTABLE CHEST 1 VIEW COMPARISON:  AP chest 04/01/2022, 02/23/2022 FINDINGS: Status post median sternotomy and CABG. Cardiac silhouette is again moderately enlarged. Mild-to-moderate calcification within the aortic arch. Horizontal linear right midlung chronic scarring is unchanged from multiple prior radiographs. No new focal airspace opacity. No pulmonary edema, pleural effusion, pneumothorax. Mild dextrocurvature of the midthoracic spine with mild-to-moderate multilevel degenerative disc changes. IMPRESSION: 1. No acute cardiopulmonary process. 2. Cardiomegaly. 3. Chronic right midlung scarring. Electronically Signed   By: Yvonne Kendall M.D.   On: 05/12/2022 08:23   DG Hand Complete Left  Result Date: 05/12/2022 CLINICAL DATA:  Fall.  Left hand pain.  Laceration to left hand. EXAM: LEFT HAND - COMPLETE 3+ VIEW COMPARISON:  None Available. FINDINGS: There is diffuse decreased bone mineralization. Severe second through fourth greater than fifth DIP, severe thumb interphalangeal, moderate to severe second through fifth PIP, moderate to severe first and second metacarpophalangeal, and mild third through fifth metacarpophalangeal  joint space narrowing and peripheral osteophytosis. Severe thumb carpometacarpal joint space narrowing, subchondral sclerosis, and peripheral osteophytosis. Severe triscaphe joint space narrowing. No acute fracture is seen.  No dislocation. IMPRESSION: 1. No acute fracture is seen. 2. Severe interphalangeal, thumb carpometacarpal, and triscaphe osteoarthritis. Electronically Signed   By: Yvonne Kendall M.D.   On: 05/12/2022 08:22   DG Hip Unilat W or Wo Pelvis 2-3 Views Right  Result Date: 05/12/2022 CLINICAL DATA:  Fall.  Right hip pain.  EXAM: DG HIP (WITH OR WITHOUT PELVIS) 2-3V RIGHT COMPARISON:  Pelvis and right hip radiographs 03/28/2019 FINDINGS: There is again high-grade diffuse decreased bone mineralization. Within this limitation, no acute fracture is identified. Moderate to severe superior right femoroacetabular joint space narrowing with peripheral acetabular and femoral head-neck junction degenerative osteophytes. Interval healing of the acute fracture previously seen within the right greater trochanter. Redemonstration of right proximal femoral compression screw. No perihardware lucency is seen to indicate hardware failure or loosening. Chronic medial displacement of partially fused right lesser trochanter fracture. Moderate superior left femoroacetabular joint space narrowing and peripheral osteophytosis. Mild bilateral sacroiliac subchondral sclerosis. The pubic symphysis joint space is maintained. Partial visualization of aorto bi-iliac vascular stents. IMPRESSION: 1. Within the limitations of diffuse decreased bone mineralization, no acute fracture is identified within the pelvis or right hip. 2. Moderate to severe right femoroacetabular osteoarthritis. 3. Moderate left femoroacetabular osteoarthritis. Electronically Signed   By: Yvonne Kendall M.D.   On: 05/12/2022 08:20   CT HEAD WO CONTRAST (5MM)  Result Date: 05/12/2022 CLINICAL DATA:  Fall EXAM: CT HEAD WITHOUT CONTRAST CT CERVICAL SPINE  WITHOUT CONTRAST TECHNIQUE: Multidetector CT imaging of the head and cervical spine was performed following the standard protocol without intravenous contrast. Multiplanar CT image reconstructions of the cervical spine were also generated. RADIATION DOSE REDUCTION: This exam was performed according to the departmental dose-optimization program which includes automated exposure control, adjustment of the mA and/or kV according to patient size and/or use of iterative reconstruction technique. COMPARISON:  CT head and cervical spine 05/08/2022 FINDINGS: CT HEAD FINDINGS Brain: There is no acute intracranial hemorrhage, extra-axial fluid collection, or acute infarct. Parenchymal volume is stable. The ventricles are stable in size. Background chronic small-vessel ischemic change is stable. There is no mass lesion.  There is no mass effect or midline shift. Vascular: There is calcification of the bilateral carotid siphons and vertebral arteries. Skull: Normal. Negative for fracture or focal lesion. Sinuses/Orbits: The imaged paranasal sinuses are clear. Bilateral lens implants are in place. The globes and orbits are otherwise unremarkable. Other: None. CT CERVICAL SPINE FINDINGS Alignment: There is no significant antero or retrolisthesis. There is no jumped or perched facet or other evidence of traumatic malalignment. Skull base and vertebrae: Skull base alignment is maintained. Vertebral body heights are preserved. There is no evidence of acute fracture. There is no suspicious osseous lesion. Soft tissues and spinal canal: No prevertebral fluid or swelling. No visible canal hematoma. Disc levels: There is disc space narrowing and degenerative endplate change most advanced at C5-C6 and C6-C7. Facet arthropathy is most advanced on the right at C4-C5. Upper chest: The lung apices are not imaged. Other: There is calcified plaque at the carotid bifurcations, left worse than right. IMPRESSION: 1. Stable noncontrast head CT  with no acute intracranial pathology. 2. No acute fracture or traumatic malalignment of the cervical spine. Electronically Signed   By: Valetta Mole M.D.   On: 05/12/2022 07:56   CT Cervical Spine Wo Contrast  Result Date: 05/12/2022 CLINICAL DATA:  Fall EXAM: CT HEAD WITHOUT CONTRAST CT CERVICAL SPINE WITHOUT CONTRAST TECHNIQUE: Multidetector CT imaging of the head and cervical spine was performed following the standard protocol without intravenous contrast. Multiplanar CT image reconstructions of the cervical spine were also generated. RADIATION DOSE REDUCTION: This exam was performed according to the departmental dose-optimization program which includes automated exposure control, adjustment of the mA and/or kV according to patient size and/or use of iterative reconstruction technique. COMPARISON:  CT  head and cervical spine 05/08/2022 FINDINGS: CT HEAD FINDINGS Brain: There is no acute intracranial hemorrhage, extra-axial fluid collection, or acute infarct. Parenchymal volume is stable. The ventricles are stable in size. Background chronic small-vessel ischemic change is stable. There is no mass lesion.  There is no mass effect or midline shift. Vascular: There is calcification of the bilateral carotid siphons and vertebral arteries. Skull: Normal. Negative for fracture or focal lesion. Sinuses/Orbits: The imaged paranasal sinuses are clear. Bilateral lens implants are in place. The globes and orbits are otherwise unremarkable. Other: None. CT CERVICAL SPINE FINDINGS Alignment: There is no significant antero or retrolisthesis. There is no jumped or perched facet or other evidence of traumatic malalignment. Skull base and vertebrae: Skull base alignment is maintained. Vertebral body heights are preserved. There is no evidence of acute fracture. There is no suspicious osseous lesion. Soft tissues and spinal canal: No prevertebral fluid or swelling. No visible canal hematoma. Disc levels: There is disc space  narrowing and degenerative endplate change most advanced at C5-C6 and C6-C7. Facet arthropathy is most advanced on the right at C4-C5. Upper chest: The lung apices are not imaged. Other: There is calcified plaque at the carotid bifurcations, left worse than right. IMPRESSION: 1. Stable noncontrast head CT with no acute intracranial pathology. 2. No acute fracture or traumatic malalignment of the cervical spine. Electronically Signed   By: Valetta Mole M.D.   On: 05/12/2022 07:56     Positive ROS: All other systems have been reviewed and were otherwise negative with the exception of those mentioned in the HPI and as above.  Physical Exam: BP (!) 146/72 (BP Location: Right Arm)   Pulse 77   Temp 97.9 F (36.6 C) (Oral)   Resp 18   Ht '6\' 3"'$  (1.905 m)   Wt 83 kg   SpO2 94%   BMI 22.87 kg/m  General:  Alert, no acute distress Psychiatric:  Patient is competent for consent with normal mood and affect    Orthopedic Exam:  RLE: + DF/PF/EHL SILT grossly over foot Foot cool to touch but similar to contralateral side.  Has appropriate capillary refill over the toes.  Faintly palpable DP pulse Knee resting in a position of flexion with the hip flexed as well.  Patient does not wish to move his knee from this position.  There is significant tenderness about the anterior aspect of the proximal tibia.  There is also mild skin tearing about the anterolateral aspect of the proximal leg. The compartments of the leg are somewhat firm, but still quite compressible.  There is no significant pain with passive stretch of the toes.   X-rays:  As above: Fracture of the proximal tibia metadiaphysis with possible fracture of the tibial tubercle as well.  Right hip radiographs suggest healed prior hip fracture with fixation from a sliding hip screw.  No signs of complication or new fracture about the right hip.    Assessment/Plan: 88 year old male with a complex medical history with a right proximal tibia  fracture with possible tibial tubercle fracture as well. 1.  Given the fracture pattern, I recommended the patient be transferred to a trauma center for definitive fixation.  Patient has been accepted to [facility] with plans for surgery likely tomorrow.  2.  I was also asked to evaluate the patient for compartment syndrome.  His compartments currently are somewhat firm, but still quite compressible.  He has minimally increased pain with passive stretch of the toes.  This does  not seem like an evolving compartment syndrome.  3.  Recommend placing the patient in a long-leg splint.  4.  Nonweightbearing on right lower extremity.  5.  Please page with any questions or concerns while the patient is at [facility].    Leim Fabry   05/12/2022 10:41 AM

## 2022-05-12 NOTE — ED Provider Notes (Signed)
[facility] Provider Note    Event Date/Time   First MD Initiated Contact with Patient 05/12/22 [phone]     (approximate)   History   Fall   HPI  Nathaniel Woods is a 88 y.o. male with history of chronic kidney disease, CHF status post pacemaker defibrillator, hypertension, hyperlipidemia, A-fib on Eliquis, CVA, diabetes, AAA status post grafting who presents to the emergency department after he fell at home.  Patient has history of frequent falls was just admitted to the hospital for the same and discharged yesterday.  Lives with his son and daughter-in-law.  States he got up and was using his walker and his legs gave out and he fell.  He is complaining of right hip pain and it was shortened and rotated per EMS.  He also has a skin tear to the left dorsal hand and is complaining of pain to the hand.  No preceding symptoms that led to his fall.  No neck or back pain, chest or abdominal pain.  Received 75 mcg of fentanyl in route with EMS.  States he did hit his head but did not lose consciousness.  Patient has had a previous right intertrochanteric femur fracture and status post ORIF.   History provided by patient and EMS.    Past Medical History:  Diagnosis Date   AAA (abdominal aortic aneurysm) without rupture (HCC)    Acute renal failure superimposed on stage 3a chronic kidney disease ([place])    AICD (automatic cardioverter/defibrillator) present 12/17/2016   Angina    Anxiety    "just before major surgery"   Arthritis    Asthma    childhood   Basal cell carcinoma 10/07/2021   left superior ear, Exc 10/15/2021   BPH associated with nocturia    Chronic systolic CHF (congestive heart failure) ([place])    Coronary artery disease    Hemorrhagic stroke ([place]) 09/07/2008   S/P fall   HOH (hard of hearing)    Hyperlipidemia    Hypertension    Hyponatremia 05/08/2022   Inferior MI ([place]) 10/08/2011   Kidney stone    PAF (paroxysmal atrial fibrillation) ([place])     Sinus bradycardia    Stroke ([place])    Type II diabetes mellitus ([place])    Improved with weight loss   Wears hearing aid in both ears     Past Surgical History:  Procedure Laterality Date   ABDOMINAL AORTIC ANEURYSM REPAIR  2013   APPENDECTOMY  06/09/1945   CARDIAC CATHETERIZATION  10/08/2011   CATARACT EXTRACTION W/PHACO Right 11/05/2021   Procedure: CATARACT EXTRACTION PHACO AND INTRAOCULAR LENS PLACEMENT ([place]) RIGHT;  Surgeon: Birder Robson, MD;  Location: [place];  Service: Ophthalmology;  Laterality: Right;  6.83 0:45.5   CATARACT EXTRACTION W/PHACO Left 11/19/2021   Procedure: CATARACT EXTRACTION PHACO AND INTRAOCULAR LENS PLACEMENT (IOC) LEFT 11.02 01:02.8;  Surgeon: Birder Robson, MD;  Location: [place];  Service: Ophthalmology;  Laterality: Left;   CORONARY ANGIOPLASTY WITH STENT PLACEMENT  06/10/2007   "2; made total of 6"   CORONARY ARTERY BYPASS GRAFT  10/24/2011   Procedure: CORONARY ARTERY BYPASS GRAFTING (CABG);  Surgeon: Ivin Poot, MD;  Location: [place];  Service: Open Heart Surgery;  Laterality: N/A;  Coronary Artery Bypass Graft times four using the left internal mammary artery and the right and left greater saphenous veins harvested endoscopically.  transesophageal Echocardiogram   ICD IMPLANT  12/17/2016   INTERTROCHANTERIC HIP FRACTURE SURGERY  05/10/1991   "  pin, 2 screws, plate"; right   LITHOTRIPSY  ~ 04/2011   TONSILLECTOMY  06/09/1942   VASECTOMY  ~ 1970    MEDICATIONS:  Prior to Admission medications   Medication Sig Start Date End Date Taking? Authorizing Provider  acetaminophen (TYLENOL) 325 MG tablet Take 2 tablets (650 mg total) by mouth every 6 (six) hours as needed for mild pain (or Fever >/= 101). 03/30/19   Gouru, Illene Silver, MD  amitriptyline (ELAVIL) 50 MG tablet Take 50 mg by mouth at bedtime.    [provider]  apixaban (ELIQUIS) 5 MG TABS tablet Take 5 mg by mouth 2 (two) times daily.     [provider]  Calcium Carbonate-Vit D-Min (CALCIUM 600+D3 PLUS MINERALS) 600-800 MG-UNIT TABS Take 1 tablet by mouth every evening.    [provider]  citalopram (CELEXA) 20 MG tablet Take 20 mg by mouth every evening.    [provider]  Coenzyme Q10 (COQ10) 200 MG CAPS Take 200 mg by mouth every evening.    [provider]  finasteride (PROSCAR) 5 MG tablet Take 5 mg by mouth every evening.    [provider]  latanoprost (XALATAN) 0.005 % ophthalmic solution Place 1 drop into both eyes at bedtime.  Patient not taking: Reported on 05/08/2022    [provider]  lidocaine (LIDODERM) 5 % Place 1 patch onto the skin daily. Remove & Discard patch within 12 hours or as directed by MD 05/10/22   Nicole Kindred A, DO  metoprolol succinate (TOPROL-XL) 25 MG 24 hr tablet Take 25 mg by mouth daily.    [provider]  montelukast (SINGULAIR) 10 MG tablet Take 10 mg by mouth at bedtime.     [provider]  rosuvastatin (CRESTOR) 5 MG tablet Take 5 mg by mouth every Monday, Wednesday, and Friday. (Evening)    [provider]  traMADol (ULTRAM) 50 MG tablet Take 1 tablet (50 mg total) by mouth every 6 (six) hours as needed for moderate pain (despite tylenol). 05/10/22   Ezekiel Slocumb, DO    Physical Exam   Triage Vital Signs: ED Triage Vitals  Enc Vitals Group     BP 05/12/22 0653 (!) 146/72     Pulse Rate 05/12/22 0653 77     Resp 05/12/22 0653 18     Temp 05/12/22 0653 97.9 F (36.6 C)     Temp Source 05/12/22 0653 Oral     SpO2 05/12/22 0652 94 %     Weight 05/12/22 0656 182 lb 15.7 oz (83 kg)     Height 05/12/22 0656 '6\' 3"'$  (1.905 m)     Head Circumference --      Peak Flow --      Pain Score 05/12/22 0655 9     Pain Loc --      Pain Edu? --      Excl. in [place]? --      Most recent vital signs: Vitals:   05/13/22 0945 05/13/22 0946  BP: 139/78 139/78  Pulse: 83 87  Resp: 15   Temp:    SpO2: 98%       CONSTITUTIONAL: Alert and oriented x4.  Elderly.  Appears extremely uncomfortable.  Moaning in pain. HEAD: Normocephalic; atraumatic EYES: Conjunctivae clear, PERRL, EOMI ENT: normal nose; no rhinorrhea; moist mucous membranes; pharynx without lesions noted; no dental injury; no septal hematoma, no epistaxis; no facial deformity or bony tenderness NECK: Supple, no midline spinal tenderness, step-off or deformity; trachea  midline CARD: RRR; S1 and S2 appreciated; no murmurs, no clicks, no rubs, no gallops RESP: Normal chest excursion without splinting or tachypnea; breath sounds clear and equal bilaterally; no wheezes, no rhonchi, no rales; no hypoxia or respiratory distress CHEST:  chest wall stable, no crepitus or ecchymosis or deformity, nontender to palpation; no flail chest ABD/GI: Normal bowel sounds; non-distended; soft, non-tender, no rebound, no guarding; no ecchymosis or other lesions noted PELVIS: Patient is tender to palpation over the right hip and the leg was shortened and rotated per EMS.  Currently he keeps his right hip and knee in flexion and will not straighten out..  TTP over right knee.  He has 2+ DP pulses doppler-ed biaterally.  Compartments soft.  Normal cap refill. BACK:  The back appears normal; no midline spinal tenderness, step-off or deformity EXT: V-shaped skin tear noted to the left dorsal hand.  1 side is already very well approximated while the other side does not approximate well but also would not be amendable to sutures and due to the gaping nature I do not think would be amenable to Steri-Strips.  He does have some bony tenderness to the left hand but no deformity.  2+ left radial pulse.  Compartments soft. SKIN: Normal color for age and race; warm NEURO: No facial asymmetry, normal speech, moving all extremities equally  ED Results / Procedures / Treatments   LABS: (all labs ordered are listed, but only abnormal results are displayed) Labs Reviewed   CBC WITH DIFFERENTIAL/PLATELET - Abnormal; Notable for the following components:      Result Value   WBC 11.1 (*)    RBC 2.92 (*)    Hemoglobin 9.5 (*)    HCT 29.7 (*)    MCV 101.7 (*)    Neutro Abs 9.1 (*)    Abs Immature Granulocytes 0.09 (*)    All other components within normal limits  BASIC METABOLIC PANEL - Abnormal; Notable for the following components:   CO2 20 (*)    Glucose, Bld 128 (*)    Calcium 8.7 (*)    GFR, Estimated 55 (*)    All other components within normal limits  PROTIME-INR - Abnormal; Notable for the following components:   Prothrombin Time 21.7 (*)    INR 1.9 (*)    All other components within normal limits  CBG MONITORING, ED - Abnormal; Notable for the following components:   Glucose-Capillary 133 (*)    All other components within normal limits  TROPONIN I (HIGH SENSITIVITY) - Abnormal; Notable for the following components:   Troponin I (High Sensitivity) 35 (*)    All other components within normal limits  TROPONIN I (HIGH SENSITIVITY) - Abnormal; Notable for the following components:   Troponin I (High Sensitivity) 67 (*)    All other components within normal limits  TROPONIN I (HIGH SENSITIVITY) - Abnormal; Notable for the following components:   Troponin I (High Sensitivity) 63 (*)    All other components within normal limits  TROPONIN I (HIGH SENSITIVITY) - Abnormal; Notable for the following components:   Troponin I (High Sensitivity) 68 (*)    All other components within normal limits  URINALYSIS, ROUTINE W REFLEX MICROSCOPIC  TYPE AND SCREEN  ABO/RH     EKG: pending  RADIOLOGY: My personal review and interpretation of imaging:  pending  I have personally reviewed all radiology reports. No results found.   PROCEDURES:  Critical Care performed: No    Procedures    IMPRESSION / MDM /  ASSESSMENT AND PLAN / ED COURSE  I reviewed the triage vital signs and the nursing notes.  Patient here with history of frequent falls  with what sounds like a mechanical fall at home when his legs gave out from underneath him which she reports is not uncommon.  Complaining of right hip and left hand pain.  Also reports he hit his head and is on Eliquis.  The patient is on the cardiac monitor to evaluate for evidence of arrhythmia and/or significant heart rate changes.   DIFFERENTIAL DIAGNOSIS (includes but not limited to):   Skin tear, hand abrasion/contusion, fracture, hip fracture, hip dislocation, knee fracture, intracranial hemorrhage, skull fracture, cervical spine fracture, concussion, anemia, electrolyte derangement, UTI, dehydration  Patient's presentation is most consistent with acute presentation with potential threat to life or bodily function.  PLAN: We will obtain CBC, BMP, EKG, urinalysis, type and screen, coags, x-ray of the right hip, right knee, left hand and CTs of the head and cervical spine.  We will update his tetanus vaccine.  We will clean the wound to his dorsal left hand and apply bacitracin and a nonadherent dressing.  We will give pain medication here.  We will keep NPO.   MEDICATIONS GIVEN IN ED: Medications  0.9 %  sodium chloride infusion (0 mLs Intravenous Stopped 05/13/22 0747)  HYDROmorphone (DILAUDID) injection 0.5 mg (0.5 mg Intravenous Given 05/13/22 0750)  acetaminophen (TYLENOL) tablet 650 mg (has no administration in time range)  amitriptyline (ELAVIL) tablet 50 mg (50 mg Oral Given 05/12/22 2139)  finasteride (PROSCAR) tablet 5 mg (5 mg Oral Not Given 05/12/22 1941)  metoprolol succinate (TOPROL-XL) 24 hr tablet 25 mg (25 mg Oral Given 05/13/22 0946)  montelukast (SINGULAIR) tablet 10 mg (10 mg Oral Given 05/12/22 2140)  dextrose 5 %-0.9 % sodium chloride infusion ( Intravenous New Bag/Given 05/12/22 2331)  rosuvastatin (CRESTOR) tablet 5 mg (5 mg Oral Given 05/13/22 0159)  citalopram (CELEXA) tablet 20 mg (20 mg Oral Given 05/12/22 2326)  fentaNYL (SUBLIMAZE) injection 50 mcg (50 mcg  Intravenous Given 05/12/22 0703)  ondansetron (ZOFRAN) injection 4 mg (4 mg Intravenous Given 05/12/22 0705)  bacitracin ointment ( Topical Given 05/12/22 0733)  Tdap (BOOSTRIX) injection 0.5 mL (0.5 mLs Intramuscular Given 05/12/22 0700)  HYDROmorphone (DILAUDID) injection 0.5 mg (0.5 mg Intravenous Given 05/12/22 0913)  sodium chloride 0.9 % bolus 250 mL (0 mLs Intravenous Stopped 05/12/22 2329)     ED COURSE: Labs, urine, EKG, imaging pending.  Signed out to oncoming ED physician.   CONSULTS: Anticipate admission to the hospital for possible fracture.   OUTSIDE RECORDS REVIEWED: Reviewed patient's admission 05/08/2022 for frequent falls and weakness.       FINAL CLINICAL IMPRESSION(S) / ED DIAGNOSES   Final diagnoses:  Fall, initial encounter  Injury of head, initial encounter  Skin tear of left hand without complication, initial encounter     Rx / DC Orders   ED Discharge Orders     None        Note:  This document was prepared using Dragon voice recognition software and may include unintentional dictation errors.        Rowin Bayron, Delice Bison, DO 05/13/22 1103

## 2022-05-12 NOTE — Plan of Care (Signed)
Transfer from [place] is a 88 year old male with pmh [place] male with medical history significant for asthma, BPH, CAD, systolic CHF, stage IIIa CKD, HTN, HLD, AAA s/p repair, PAF on Eliquis, DM type II, and CVA who presents with presented after having a fall found to have a deformity of the right lower extremity.  X-rays note a right tibial metadiaphysis fracture.  Transfer requested at [facility] for need of orthopedics.  Labs noted troponin mildly elevated, but otherwise no significant ischemic changes on EKG.  Transferring to a surgical telemetry bed.  The patient had just recently been hospitalized from 11/30-12/2 after having frequent falls.

## 2022-05-12 NOTE — ED Notes (Signed)
Called [facility] spoke to [place]   patients still waiting for bed assignment  [id number]

## 2022-05-12 NOTE — ED Notes (Signed)
Pt to ct 

## 2022-05-12 NOTE — ED Notes (Signed)
Called [facility] spoke to [place]  for transfer by Dr. Starleen Blue [phone]

## 2022-05-12 NOTE — ED Notes (Signed)
Pt is unable to pass swallow study. Pt screams in pain with minor movement.

## 2022-05-12 NOTE — TOC Progression Note (Signed)
Transition of Care [facility]) - Progression Note    Patient Details  Name: Nathaniel Woods MRN: [id number] Date of Birth: 1929/05/23  Transition of Care [facility]) CM/SW [place], RN Phone Number: 05/12/2022, 4:14 PM  Clinical Narrative:     [facility] is open with [facility], they report that he will likely need SNF        Expected Discharge Plan and Services                                                 Social Determinants of Health (SDOH) Interventions    Readmission Risk Interventions     No data to display

## 2022-05-12 NOTE — ED Triage Notes (Signed)
88y/o M from home to ED via [place]. Pt had an unwitnessed fall this am. Pt has obvious rotation and shortening to r hip and laceration to left hand. Pt is on blood thinners. Pt was just d/c'd from Inpatient status yesterday d/t multiple falls and dehydration.

## 2022-05-13 ENCOUNTER — Encounter (HOSPITAL_COMMUNITY)
Admission: RE | Disposition: A | Discharge status: Skilled Nursing Facility | Payer: Self-pay | Source: Other Acute Inpatient Hospital | Attending: Family Medicine

## 2022-05-13 ENCOUNTER — Encounter (HOSPITAL_COMMUNITY): Payer: Self-pay | Admitting: Orthopedic Surgery

## 2022-05-13 ENCOUNTER — Emergency Department: Payer: PPO

## 2022-05-13 ENCOUNTER — Inpatient Hospital Stay (HOSPITAL_COMMUNITY): Payer: PPO

## 2022-05-13 ENCOUNTER — Emergency Department
Admit: 2022-05-13 | Discharge: 2022-05-13 | Disposition: A | Discharge status: Home / Self Care | Payer: PPO | Attending: Cardiology | Admitting: Cardiology

## 2022-05-13 ENCOUNTER — Inpatient Hospital Stay (HOSPITAL_COMMUNITY): Payer: PPO | Admitting: Anesthesiology

## 2022-05-13 ENCOUNTER — Inpatient Hospital Stay (HOSPITAL_COMMUNITY): Admission: RE | Admit: 2022-05-13 | Payer: PPO | Source: Home / Self Care | Admitting: Orthopedic Surgery

## 2022-05-13 ENCOUNTER — Encounter (HOSPITAL_COMMUNITY): Admission: RE | Payer: Self-pay | Source: Home / Self Care

## 2022-05-13 ENCOUNTER — Inpatient Hospital Stay (HOSPITAL_COMMUNITY)
Admission: RE | Admit: 2022-05-13 | Discharge: 2022-05-20 | DRG: 493 | Disposition: A | Discharge status: Skilled Nursing Facility | Payer: PPO | Source: Other Acute Inpatient Hospital | Attending: Orthopedic Surgery | Admitting: Orthopedic Surgery

## 2022-05-13 DIAGNOSIS — M6259 Muscle wasting and atrophy, not elsewhere classified, multiple sites: Secondary | ICD-10-CM | POA: Diagnosis not present

## 2022-05-13 DIAGNOSIS — I10 Essential (primary) hypertension: Secondary | ICD-10-CM | POA: Diagnosis present

## 2022-05-13 DIAGNOSIS — N400 Enlarged prostate without lower urinary tract symptoms: Secondary | ICD-10-CM | POA: Diagnosis not present

## 2022-05-13 DIAGNOSIS — S82191S Other fracture of upper end of right tibia, sequela: Secondary | ICD-10-CM | POA: Diagnosis not present

## 2022-05-13 DIAGNOSIS — Z974 Presence of external hearing-aid: Secondary | ICD-10-CM

## 2022-05-13 DIAGNOSIS — F32A Depression, unspecified: Secondary | ICD-10-CM | POA: Diagnosis not present

## 2022-05-13 DIAGNOSIS — N1831 Chronic kidney disease, stage 3a: Secondary | ICD-10-CM | POA: Diagnosis present

## 2022-05-13 DIAGNOSIS — I2510 Atherosclerotic heart disease of native coronary artery without angina pectoris: Secondary | ICD-10-CM

## 2022-05-13 DIAGNOSIS — S0990XA Unspecified injury of head, initial encounter: Secondary | ICD-10-CM

## 2022-05-13 DIAGNOSIS — E1122 Type 2 diabetes mellitus with diabetic chronic kidney disease: Secondary | ICD-10-CM | POA: Diagnosis not present

## 2022-05-13 DIAGNOSIS — I480 Paroxysmal atrial fibrillation: Secondary | ICD-10-CM | POA: Diagnosis not present

## 2022-05-13 DIAGNOSIS — R41841 Cognitive communication deficit: Secondary | ICD-10-CM | POA: Diagnosis not present

## 2022-05-13 DIAGNOSIS — R296 Repeated falls: Secondary | ICD-10-CM | POA: Diagnosis not present

## 2022-05-13 DIAGNOSIS — Z8679 Personal history of other diseases of the circulatory system: Secondary | ICD-10-CM

## 2022-05-13 DIAGNOSIS — E785 Hyperlipidemia, unspecified: Secondary | ICD-10-CM | POA: Diagnosis present

## 2022-05-13 DIAGNOSIS — Z87891 Personal history of nicotine dependence: Secondary | ICD-10-CM | POA: Diagnosis not present

## 2022-05-13 DIAGNOSIS — R410 Disorientation, unspecified: Secondary | ICD-10-CM | POA: Diagnosis not present

## 2022-05-13 DIAGNOSIS — M19012 Primary osteoarthritis, left shoulder: Secondary | ICD-10-CM | POA: Diagnosis not present

## 2022-05-13 DIAGNOSIS — S82101A Unspecified fracture of upper end of right tibia, initial encounter for closed fracture: Secondary | ICD-10-CM | POA: Diagnosis not present

## 2022-05-13 DIAGNOSIS — Z881 Allergy status to other antibiotic agents status: Secondary | ICD-10-CM

## 2022-05-13 DIAGNOSIS — Z961 Presence of intraocular lens: Secondary | ICD-10-CM | POA: Diagnosis present

## 2022-05-13 DIAGNOSIS — Z85828 Personal history of other malignant neoplasm of skin: Secondary | ICD-10-CM

## 2022-05-13 DIAGNOSIS — Z95810 Presence of automatic (implantable) cardiac defibrillator: Secondary | ICD-10-CM

## 2022-05-13 DIAGNOSIS — W19XXXA Unspecified fall, initial encounter: Secondary | ICD-10-CM

## 2022-05-13 DIAGNOSIS — F0393 Unspecified dementia, unspecified severity, with mood disturbance: Secondary | ICD-10-CM | POA: Diagnosis present

## 2022-05-13 DIAGNOSIS — L89611 Pressure ulcer of right heel, stage 1: Secondary | ICD-10-CM | POA: Diagnosis not present

## 2022-05-13 DIAGNOSIS — J45909 Unspecified asthma, uncomplicated: Secondary | ICD-10-CM | POA: Diagnosis not present

## 2022-05-13 DIAGNOSIS — S4992XA Unspecified injury of left shoulder and upper arm, initial encounter: Secondary | ICD-10-CM | POA: Diagnosis not present

## 2022-05-13 DIAGNOSIS — E119 Type 2 diabetes mellitus without complications: Secondary | ICD-10-CM

## 2022-05-13 DIAGNOSIS — Z79899 Other long term (current) drug therapy: Secondary | ICD-10-CM

## 2022-05-13 DIAGNOSIS — S82201B Unspecified fracture of shaft of right tibia, initial encounter for open fracture type I or II: Secondary | ICD-10-CM | POA: Diagnosis not present

## 2022-05-13 DIAGNOSIS — I130 Hypertensive heart and chronic kidney disease with heart failure and stage 1 through stage 4 chronic kidney disease, or unspecified chronic kidney disease: Secondary | ICD-10-CM | POA: Diagnosis present

## 2022-05-13 DIAGNOSIS — R262 Difficulty in walking, not elsewhere classified: Secondary | ICD-10-CM | POA: Diagnosis not present

## 2022-05-13 DIAGNOSIS — Z91030 Bee allergy status: Secondary | ICD-10-CM

## 2022-05-13 DIAGNOSIS — Z886 Allergy status to analgesic agent status: Secondary | ICD-10-CM

## 2022-05-13 DIAGNOSIS — Z955 Presence of coronary angioplasty implant and graft: Secondary | ICD-10-CM

## 2022-05-13 DIAGNOSIS — M80061A Age-related osteoporosis with current pathological fracture, right lower leg, initial encounter for fracture: Secondary | ICD-10-CM | POA: Diagnosis not present

## 2022-05-13 DIAGNOSIS — Y92009 Unspecified place in unspecified non-institutional (private) residence as the place of occurrence of the external cause: Secondary | ICD-10-CM | POA: Diagnosis not present

## 2022-05-13 DIAGNOSIS — I252 Old myocardial infarction: Secondary | ICD-10-CM

## 2022-05-13 DIAGNOSIS — D62 Acute posthemorrhagic anemia: Secondary | ICD-10-CM | POA: Diagnosis not present

## 2022-05-13 DIAGNOSIS — Z7401 Bed confinement status: Secondary | ICD-10-CM | POA: Diagnosis not present

## 2022-05-13 DIAGNOSIS — E1159 Type 2 diabetes mellitus with other circulatory complications: Secondary | ICD-10-CM

## 2022-05-13 DIAGNOSIS — I509 Heart failure, unspecified: Secondary | ICD-10-CM

## 2022-05-13 DIAGNOSIS — Z951 Presence of aortocoronary bypass graft: Secondary | ICD-10-CM

## 2022-05-13 DIAGNOSIS — S82201A Unspecified fracture of shaft of right tibia, initial encounter for closed fracture: Secondary | ICD-10-CM | POA: Insufficient documentation

## 2022-05-13 DIAGNOSIS — R238 Other skin changes: Secondary | ICD-10-CM | POA: Diagnosis present

## 2022-05-13 DIAGNOSIS — L89151 Pressure ulcer of sacral region, stage 1: Secondary | ICD-10-CM | POA: Diagnosis present

## 2022-05-13 DIAGNOSIS — Z9842 Cataract extraction status, left eye: Secondary | ICD-10-CM

## 2022-05-13 DIAGNOSIS — Z91041 Radiographic dye allergy status: Secondary | ICD-10-CM

## 2022-05-13 DIAGNOSIS — E860 Dehydration: Secondary | ICD-10-CM | POA: Diagnosis not present

## 2022-05-13 DIAGNOSIS — F0390 Unspecified dementia without behavioral disturbance: Secondary | ICD-10-CM | POA: Diagnosis not present

## 2022-05-13 DIAGNOSIS — L89322 Pressure ulcer of left buttock, stage 2: Secondary | ICD-10-CM | POA: Diagnosis present

## 2022-05-13 DIAGNOSIS — Z471 Aftercare following joint replacement surgery: Secondary | ICD-10-CM | POA: Diagnosis not present

## 2022-05-13 DIAGNOSIS — Z803 Family history of malignant neoplasm of breast: Secondary | ICD-10-CM

## 2022-05-13 DIAGNOSIS — R600 Localized edema: Secondary | ICD-10-CM | POA: Diagnosis not present

## 2022-05-13 DIAGNOSIS — S82141A Displaced bicondylar fracture of right tibia, initial encounter for closed fracture: Secondary | ICD-10-CM | POA: Diagnosis not present

## 2022-05-13 DIAGNOSIS — Z515 Encounter for palliative care: Secondary | ICD-10-CM | POA: Diagnosis not present

## 2022-05-13 DIAGNOSIS — F419 Anxiety disorder, unspecified: Secondary | ICD-10-CM | POA: Diagnosis present

## 2022-05-13 DIAGNOSIS — M898X9 Other specified disorders of bone, unspecified site: Secondary | ICD-10-CM | POA: Diagnosis present

## 2022-05-13 DIAGNOSIS — I110 Hypertensive heart disease with heart failure: Secondary | ICD-10-CM | POA: Diagnosis not present

## 2022-05-13 DIAGNOSIS — Z87442 Personal history of urinary calculi: Secondary | ICD-10-CM

## 2022-05-13 DIAGNOSIS — I5022 Chronic systolic (congestive) heart failure: Secondary | ICD-10-CM | POA: Diagnosis present

## 2022-05-13 DIAGNOSIS — R2689 Other abnormalities of gait and mobility: Secondary | ICD-10-CM | POA: Diagnosis not present

## 2022-05-13 DIAGNOSIS — G8918 Other acute postprocedural pain: Secondary | ICD-10-CM | POA: Diagnosis not present

## 2022-05-13 DIAGNOSIS — Z7901 Long term (current) use of anticoagulants: Secondary | ICD-10-CM

## 2022-05-13 DIAGNOSIS — Z888 Allergy status to other drugs, medicaments and biological substances status: Secondary | ICD-10-CM

## 2022-05-13 DIAGNOSIS — Z4789 Encounter for other orthopedic aftercare: Secondary | ICD-10-CM | POA: Diagnosis not present

## 2022-05-13 DIAGNOSIS — Z8673 Personal history of transient ischemic attack (TIA), and cerebral infarction without residual deficits: Secondary | ICD-10-CM

## 2022-05-13 DIAGNOSIS — Z789 Other specified health status: Secondary | ICD-10-CM | POA: Diagnosis not present

## 2022-05-13 DIAGNOSIS — Z9841 Cataract extraction status, right eye: Secondary | ICD-10-CM

## 2022-05-13 DIAGNOSIS — M6281 Muscle weakness (generalized): Secondary | ICD-10-CM | POA: Diagnosis not present

## 2022-05-13 DIAGNOSIS — E1165 Type 2 diabetes mellitus with hyperglycemia: Secondary | ICD-10-CM | POA: Diagnosis not present

## 2022-05-13 DIAGNOSIS — S82201S Unspecified fracture of shaft of right tibia, sequela: Secondary | ICD-10-CM | POA: Diagnosis not present

## 2022-05-13 DIAGNOSIS — R1312 Dysphagia, oropharyngeal phase: Secondary | ICD-10-CM | POA: Diagnosis not present

## 2022-05-13 DIAGNOSIS — H9190 Unspecified hearing loss, unspecified ear: Secondary | ICD-10-CM | POA: Diagnosis present

## 2022-05-13 DIAGNOSIS — S82251A Displaced comminuted fracture of shaft of right tibia, initial encounter for closed fracture: Secondary | ICD-10-CM | POA: Diagnosis not present

## 2022-05-13 DIAGNOSIS — R4182 Altered mental status, unspecified: Secondary | ICD-10-CM | POA: Diagnosis not present

## 2022-05-13 DIAGNOSIS — R531 Weakness: Secondary | ICD-10-CM | POA: Diagnosis not present

## 2022-05-13 HISTORY — PX: ORIF TIBIA FRACTURE: SHX5416

## 2022-05-13 LAB — CBG MONITORING, ED: Glucose-Capillary: 133 mg/dL — ABNORMAL HIGH (ref 70–99)

## 2022-05-13 LAB — GLUCOSE, CAPILLARY
Glucose-Capillary: 139 mg/dL — ABNORMAL HIGH (ref 70–99)
Glucose-Capillary: 147 mg/dL — ABNORMAL HIGH (ref 70–99)
Glucose-Capillary: 152 mg/dL — ABNORMAL HIGH (ref 70–99)

## 2022-05-13 LAB — TROPONIN I (HIGH SENSITIVITY)
Troponin I (High Sensitivity): 63 ng/L — ABNORMAL HIGH (ref <18)
Troponin I (High Sensitivity): 67 ng/L — ABNORMAL HIGH (ref <18)
Troponin I (High Sensitivity): 68 ng/L — ABNORMAL HIGH (ref <18)

## 2022-05-13 LAB — ABO/RH: ABO/RH(D): A NEG

## 2022-05-13 SURGERY — OPEN REDUCTION INTERNAL FIXATION (ORIF) TIBIA FRACTURE
Anesthesia: General | Site: Leg Lower | Laterality: Right

## 2022-05-13 SURGERY — OPEN REDUCTION INTERNAL FIXATION (ORIF) TIBIA FRACTURE
Anesthesia: General | Laterality: Right

## 2022-05-13 MED ORDER — INSULIN ASPART 100 UNIT/ML IJ SOLN: 0.0000 [IU] | INTRAMUSCULAR | Status: DC | PRN

## 2022-05-13 MED ORDER — OXYCODONE HCL 5 MG PO TABS: 5.0000 mg | ORAL_TABLET | Freq: Once | ORAL | Status: DC | PRN

## 2022-05-13 MED ORDER — DOCUSATE SODIUM 100 MG PO CAPS
100.0000 mg | ORAL_CAPSULE | Freq: Two times a day (BID) | ORAL | Status: DC
  Administered 2022-05-14 – 2022-05-20 (×13): 100 mg via ORAL
  Filled 2022-05-13 (×13): qty 1

## 2022-05-13 MED ORDER — PROPOFOL 10 MG/ML IV BOLUS
INTRAVENOUS | Status: AC
  Filled 2022-05-13: qty 20

## 2022-05-13 MED ORDER — OXYCODONE HCL 5 MG PO TABS
5.0000 mg | ORAL_TABLET | ORAL | Status: DC | PRN
  Administered 2022-05-14 – 2022-05-19 (×10): 5 mg via ORAL
  Filled 2022-05-13 (×11): qty 1

## 2022-05-13 MED ORDER — PHENYLEPHRINE 80 MCG/ML (10ML) SYRINGE FOR IV PUSH (FOR BLOOD PRESSURE SUPPORT)
PREFILLED_SYRINGE | INTRAVENOUS | Status: DC | PRN
  Administered 2022-05-13 (×4): 160 ug via INTRAVENOUS

## 2022-05-13 MED ORDER — MORPHINE SULFATE (PF) 2 MG/ML IV SOLN: 0.5000 mg | INTRAVENOUS | Status: DC | PRN

## 2022-05-13 MED ORDER — OXYCODONE HCL 5 MG/5ML PO SOLN: 5.0000 mg | Freq: Once | ORAL | Status: DC | PRN

## 2022-05-13 MED ORDER — MONTELUKAST SODIUM 10 MG PO TABS
10.0000 mg | ORAL_TABLET | Freq: Every day | ORAL | Status: DC
  Administered 2022-05-13 – 2022-05-19 (×7): 10 mg via ORAL
  Filled 2022-05-13 (×7): qty 1

## 2022-05-13 MED ORDER — 0.9 % SODIUM CHLORIDE (POUR BTL) OPTIME
TOPICAL | Status: DC | PRN
  Administered 2022-05-13: 1000 mL

## 2022-05-13 MED ORDER — LACTATED RINGERS IV SOLN: INTRAVENOUS | Status: DC

## 2022-05-13 MED ORDER — METHOCARBAMOL 500 MG PO TABS
500.0000 mg | ORAL_TABLET | Freq: Four times a day (QID) | ORAL | Status: DC | PRN
  Administered 2022-05-15 – 2022-05-18 (×6): 500 mg via ORAL
  Filled 2022-05-13 (×8): qty 1

## 2022-05-13 MED ORDER — FINASTERIDE 5 MG PO TABS
5.0000 mg | ORAL_TABLET | Freq: Every evening | ORAL | Status: DC
  Administered 2022-05-13 – 2022-05-19 (×7): 5 mg via ORAL
  Filled 2022-05-13 (×7): qty 1

## 2022-05-13 MED ORDER — ONDANSETRON HCL 4 MG/2ML IJ SOLN
INTRAMUSCULAR | Status: DC | PRN
  Administered 2022-05-13: 4 mg via INTRAVENOUS

## 2022-05-13 MED ORDER — COQ10 200 MG PO CAPS: 200.0000 mg | ORAL_CAPSULE | Freq: Every evening | ORAL | Status: DC

## 2022-05-13 MED ORDER — ROSUVASTATIN CALCIUM 5 MG PO TABS
5.0000 mg | ORAL_TABLET | ORAL | Status: DC
  Administered 2022-05-14 – 2022-05-19 (×3): 5 mg via ORAL
  Filled 2022-05-13 (×4): qty 1

## 2022-05-13 MED ORDER — AMITRIPTYLINE HCL 50 MG PO TABS
50.0000 mg | ORAL_TABLET | Freq: Every day | ORAL | Status: DC
  Administered 2022-05-13 – 2022-05-19 (×7): 50 mg via ORAL
  Filled 2022-05-13 (×7): qty 1

## 2022-05-13 MED ORDER — CITALOPRAM HYDROBROMIDE 20 MG PO TABS
20.0000 mg | ORAL_TABLET | Freq: Every evening | ORAL | Status: DC
  Administered 2022-05-14 – 2022-05-19 (×6): 20 mg via ORAL
  Filled 2022-05-13 (×6): qty 1

## 2022-05-13 MED ORDER — APIXABAN 5 MG PO TABS
5.0000 mg | ORAL_TABLET | Freq: Two times a day (BID) | ORAL | Status: DC
  Filled 2022-05-13: qty 1

## 2022-05-13 MED ORDER — FENTANYL CITRATE (PF) 100 MCG/2ML IJ SOLN: 25.0000 ug | INTRAMUSCULAR | Status: DC | PRN

## 2022-05-13 MED ORDER — METHOCARBAMOL 1000 MG/10ML IJ SOLN: 500.0000 mg | Freq: Four times a day (QID) | INTRAVENOUS | Status: DC | PRN

## 2022-05-13 MED ORDER — KCL IN DEXTROSE-NACL 20-5-0.45 MEQ/L-%-% IV SOLN
INTRAVENOUS | Status: AC
  Filled 2022-05-13 (×2): qty 1000

## 2022-05-13 MED ORDER — ORAL CARE MOUTH RINSE: 15.0000 mL | Freq: Once | OROMUCOSAL | Status: AC

## 2022-05-13 MED ORDER — HYDROCODONE-ACETAMINOPHEN 5-325 MG PO TABS: 1.0000 | ORAL_TABLET | Freq: Four times a day (QID) | ORAL | Status: DC | PRN

## 2022-05-13 MED ORDER — FENTANYL CITRATE (PF) 250 MCG/5ML IJ SOLN
INTRAMUSCULAR | Status: DC | PRN
  Administered 2022-05-13: 50 ug via INTRAVENOUS

## 2022-05-13 MED ORDER — EPHEDRINE SULFATE-NACL 50-0.9 MG/10ML-% IV SOSY
PREFILLED_SYRINGE | INTRAVENOUS | Status: DC | PRN
  Administered 2022-05-13: 5 mg via INTRAVENOUS
  Administered 2022-05-13: 10 mg via INTRAVENOUS
  Administered 2022-05-13 (×5): 5 mg via INTRAVENOUS

## 2022-05-13 MED ORDER — POLYETHYLENE GLYCOL 3350 17 G PO PACK
17.0000 g | PACK | Freq: Every day | ORAL | Status: DC | PRN
  Administered 2022-05-18: 17 g via ORAL
  Filled 2022-05-13: qty 1

## 2022-05-13 MED ORDER — PHENYLEPHRINE HCL-NACL 20-0.9 MG/250ML-% IV SOLN
INTRAVENOUS | Status: DC | PRN
  Administered 2022-05-13: 20 ug/min via INTRAVENOUS

## 2022-05-13 MED ORDER — CHLORHEXIDINE GLUCONATE 0.12 % MT SOLN
OROMUCOSAL | Status: AC
  Filled 2022-05-13: qty 15

## 2022-05-13 MED ORDER — SUGAMMADEX SODIUM 200 MG/2ML IV SOLN
INTRAVENOUS | Status: DC | PRN
  Administered 2022-05-13: 200 mg via INTRAVENOUS

## 2022-05-13 MED ORDER — FENTANYL CITRATE (PF) 250 MCG/5ML IJ SOLN
INTRAMUSCULAR | Status: AC
  Filled 2022-05-13: qty 5

## 2022-05-13 MED ORDER — BUPIVACAINE-EPINEPHRINE (PF) 0.5% -1:200000 IJ SOLN
INTRAMUSCULAR | Status: DC | PRN
  Administered 2022-05-13 (×2): 15 mL via PERINEURAL

## 2022-05-13 MED ORDER — PROPOFOL 10 MG/ML IV BOLUS
INTRAVENOUS | Status: DC | PRN
  Administered 2022-05-13: 80 mg via INTRAVENOUS

## 2022-05-13 MED ORDER — DEXAMETHASONE SODIUM PHOSPHATE 10 MG/ML IJ SOLN
INTRAMUSCULAR | Status: DC | PRN
  Administered 2022-05-13: 10 mg via INTRAVENOUS

## 2022-05-13 MED ORDER — HYDROMORPHONE HCL 1 MG/ML IJ SOLN: 0.5000 mg | INTRAMUSCULAR | Status: DC | PRN

## 2022-05-13 MED ORDER — ROCURONIUM BROMIDE 10 MG/ML (PF) SYRINGE
PREFILLED_SYRINGE | INTRAVENOUS | Status: DC | PRN
  Administered 2022-05-13: 60 mg via INTRAVENOUS

## 2022-05-13 MED ORDER — CEFAZOLIN SODIUM-DEXTROSE 2-4 GM/100ML-% IV SOLN
2.0000 g | Freq: Three times a day (TID) | INTRAVENOUS | Status: AC
  Administered 2022-05-13 – 2022-05-14 (×3): 2 g via INTRAVENOUS
  Filled 2022-05-13 (×3): qty 100

## 2022-05-13 MED ORDER — CHLORHEXIDINE GLUCONATE 0.12 % MT SOLN
15.0000 mL | Freq: Once | OROMUCOSAL | Status: AC
  Administered 2022-05-13: 15 mL via OROMUCOSAL

## 2022-05-13 MED ORDER — METOPROLOL SUCCINATE ER 25 MG PO TB24
25.0000 mg | ORAL_TABLET | Freq: Every day | ORAL | Status: DC
  Administered 2022-05-14 – 2022-05-20 (×7): 25 mg via ORAL
  Filled 2022-05-13 (×7): qty 1

## 2022-05-13 MED ORDER — BUPIVACAINE LIPOSOME 1.3 % IJ SUSP
INTRAMUSCULAR | Status: DC | PRN
  Administered 2022-05-13: 10 mL via PERINEURAL

## 2022-05-13 MED ORDER — CEFAZOLIN SODIUM-DEXTROSE 2-4 GM/100ML-% IV SOLN
INTRAVENOUS | Status: AC
  Filled 2022-05-13: qty 100

## 2022-05-13 MED ORDER — LIDOCAINE 2% (20 MG/ML) 5 ML SYRINGE
INTRAMUSCULAR | Status: DC | PRN
  Administered 2022-05-13: 100 mg via INTRAVENOUS

## 2022-05-13 MED ORDER — ACETAMINOPHEN 325 MG PO TABS
650.0000 mg | ORAL_TABLET | Freq: Four times a day (QID) | ORAL | Status: DC
  Administered 2022-05-13 – 2022-05-18 (×19): 650 mg via ORAL
  Filled 2022-05-13 (×22): qty 2

## 2022-05-13 MED ORDER — OYSTER SHELL CALCIUM/D3 500-5 MG-MCG PO TABS
1.0000 | ORAL_TABLET | Freq: Every evening | ORAL | Status: DC
  Administered 2022-05-13 – 2022-05-19 (×7): 1 via ORAL
  Filled 2022-05-13 (×7): qty 1

## 2022-05-13 SURGICAL SUPPLY — 71 items
BAG COUNTER SPONGE SURGICOUNT (BAG) ×1 IMPLANT
BANDAGE ESMARK 6X9 LF (GAUZE/BANDAGES/DRESSINGS) ×1 IMPLANT
BIT DRILL 3.3 LONG (BIT) IMPLANT
BIT DRILL QC 3.3X195 (BIT) IMPLANT
BLADE CLIPPER SURG (BLADE) IMPLANT
BNDG COHESIVE 4X5 TAN STRL (GAUZE/BANDAGES/DRESSINGS) IMPLANT
BNDG ELASTIC 4X5.8 VLCR STR LF (GAUZE/BANDAGES/DRESSINGS) ×1 IMPLANT
BNDG ELASTIC 6X5.8 VLCR STR LF (GAUZE/BANDAGES/DRESSINGS) ×1 IMPLANT
BNDG ESMARK 6X9 LF (GAUZE/BANDAGES/DRESSINGS) ×1
BNDG GAUZE DERMACEA FLUFF 4 (GAUZE/BANDAGES/DRESSINGS) ×1 IMPLANT
BRUSH SCRUB EZ PLAIN DRY (MISCELLANEOUS) ×2 IMPLANT
CAP LOCK NCB (Cap) IMPLANT
COVER MAYO STAND STRL (DRAPES) ×1 IMPLANT
DRAPE C-ARM 42X72 X-RAY (DRAPES) ×1 IMPLANT
DRAPE C-ARMOR (DRAPES) ×1 IMPLANT
DRAPE HALF SHEET 40X57 (DRAPES) ×2 IMPLANT
DRAPE INCISE IOBAN 66X45 STRL (DRAPES) ×1 IMPLANT
DRAPE U-SHAPE 47X51 STRL (DRAPES) ×1 IMPLANT
DRSG ADAPTIC 3X8 NADH LF (GAUZE/BANDAGES/DRESSINGS) ×1 IMPLANT
ELECT REM PT RETURN 9FT ADLT (ELECTROSURGICAL) ×1
ELECTRODE REM PT RTRN 9FT ADLT (ELECTROSURGICAL) ×1 IMPLANT
GAUZE PAD ABD 8X10 STRL (GAUZE/BANDAGES/DRESSINGS) ×4 IMPLANT
GAUZE SPONGE 4X4 12PLY STRL (GAUZE/BANDAGES/DRESSINGS) ×1 IMPLANT
GLOVE BIO SURGEON STRL SZ7.5 (GLOVE) ×1 IMPLANT
GLOVE BIO SURGEON STRL SZ8 (GLOVE) ×1 IMPLANT
GLOVE BIOGEL PI IND STRL 7.5 (GLOVE) ×1 IMPLANT
GLOVE BIOGEL PI IND STRL 8 (GLOVE) ×1 IMPLANT
GLOVE SURG ORTHO LTX SZ7.5 (GLOVE) ×2 IMPLANT
GLOVE XGUARD RR 2 7.5 (GLOVE) ×1 IMPLANT
GLOVE XGUARD RR2 7.5 (GLOVE) ×1
GOWN STRL REUS W/ TWL LRG LVL3 (GOWN DISPOSABLE) ×2 IMPLANT
GOWN STRL REUS W/ TWL XL LVL3 (GOWN DISPOSABLE) ×1 IMPLANT
GOWN STRL REUS W/TWL LRG LVL3 (GOWN DISPOSABLE) ×2
GOWN STRL REUS W/TWL XL LVL3 (GOWN DISPOSABLE) ×1
K-WIRE 2.0 (WIRE) ×3
K-WIRE FXSTD 280X2XNS SS (WIRE) ×3
KIT BASIN OR (CUSTOM PROCEDURE TRAY) ×1 IMPLANT
KIT TURNOVER KIT B (KITS) ×1 IMPLANT
KWIRE FXSTD 280X2XNS SS (WIRE) IMPLANT
MANIFOLD NEPTUNE II (INSTRUMENTS) ×1 IMPLANT
NS IRRIG 1000ML POUR BTL (IV SOLUTION) ×1 IMPLANT
PACK ORTHO EXTREMITY (CUSTOM PROCEDURE TRAY) ×1 IMPLANT
PAD ARMBOARD 7.5X6 YLW CONV (MISCELLANEOUS) ×2 IMPLANT
PAD CAST 4YDX4 CTTN HI CHSV (CAST SUPPLIES) ×1 IMPLANT
PADDING CAST COTTON 4X4 STRL (CAST SUPPLIES) ×1
PADDING CAST COTTON 6X4 STRL (CAST SUPPLIES) ×1 IMPLANT
PLATE NCB LAT PROX 3H TIBIA 9H (Plate) IMPLANT
SCREW NCB 4.0 32MM (Screw) IMPLANT
SCREW NCB 4.0MX34M (Screw) IMPLANT
SCREW NCB 4.0MX38M (Screw) IMPLANT
SCREW NCB 4.0X36MM (Screw) IMPLANT
SCREW NCB PA ST 4X3.4X85 (Screw) IMPLANT
SCREW NCB PA ST 4X3.4X90 (Screw) IMPLANT
SPONGE T-LAP 18X18 ~~LOC~~+RFID (SPONGE) IMPLANT
STAPLER VISISTAT 35W (STAPLE) ×1 IMPLANT
STOCKINETTE IMPERVIOUS LG (DRAPES) IMPLANT
SUCTION FRAZIER HANDLE 10FR (MISCELLANEOUS) ×1
SUCTION TUBE FRAZIER 10FR DISP (MISCELLANEOUS) ×1 IMPLANT
SUT ETHILON 2 0 FS 18 (SUTURE) IMPLANT
SUT VIC AB 0 CT1 27 (SUTURE) ×1
SUT VIC AB 0 CT1 27XBRD ANBCTR (SUTURE) ×1 IMPLANT
SUT VIC AB 1 CT1 27 (SUTURE) ×1
SUT VIC AB 1 CT1 27XBRD ANBCTR (SUTURE) ×1 IMPLANT
SUT VIC AB 2-0 CT1 27 (SUTURE) ×2
SUT VIC AB 2-0 CT1 TAPERPNT 27 (SUTURE) ×2 IMPLANT
TOWEL GREEN STERILE (TOWEL DISPOSABLE) ×2 IMPLANT
TOWEL GREEN STERILE FF (TOWEL DISPOSABLE) ×1 IMPLANT
TRAY FOLEY MTR SLVR 16FR STAT (SET/KITS/TRAYS/PACK) IMPLANT
TUBE CONNECTING 12X1/4 (SUCTIONS) ×1 IMPLANT
WATER STERILE IRR 1000ML POUR (IV SOLUTION) ×2 IMPLANT
YANKAUER SUCT BULB TIP NO VENT (SUCTIONS) ×1 IMPLANT

## 2022-05-13 NOTE — ED Notes (Addendum)
This RN called [facility] OR to give report on this pt. Given verbal order to administer scheduled Lopressor and check CBG.

## 2022-05-13 NOTE — Assessment & Plan Note (Addendum)
-   The patient will be transferred for ORIF to [facility]. - She was accepted by Dr. Altamese [place]. - He has elevated troponin I that are currently settled.  I suspect demand ischemia.  I do not think that he is developing non-STEMI.  He has been chest pain-free.  It showed no evidence for arrhythmias. - We will obtain a cardiology consult and 2D echo this a.m. - I notified Dr. Nehemiah Massed about the patient. - We will continue statin therapy. - We will continue beta-blocker therapy and both should provide preoperative cardiovascular risk reduction. - The patient has a history of coronary artery disease and diabetes mellitus but not on insulin, with history of CHF and CVA and no history of of renal failure with a creatinine more than 2.  It is considered moderate to high risk for perioperative cardiovascular events though he is currently clinically stable.

## 2022-05-13 NOTE — ED Notes (Signed)
[facility] reviewed and all up to required documents are up to date. Pt is ready for transport. [facility] at bedside.

## 2022-05-13 NOTE — ED Notes (Signed)
Pt gave verbal consent for transfer.

## 2022-05-13 NOTE — ED Notes (Signed)
Pt repositioned in bed, pillow placed under right knee/in between legs

## 2022-05-13 NOTE — Progress Notes (Signed)
Brief consult follow-up note.  Please see same-day consult note for full details.  88 year old male history significant for CAD, chronic systolic CHF, CVA, A-fib on Eliquis, hypertension presents the ED with acute onset of recurrent falls.  Recent discharge.  X-ray revealed fracture of the proximal tibia.  Consult with Dr. Posey Pronto at [facility].  Recommend transfer to [facility].  Patient remains under the care of the emergency department  Do not feel the troponins are indicative of ACS.  Possibly pain related versus related to underlying atrial arrhythmia.  Patient is appropriate for transfer to [facility].  Ralene Muskrat MD [facility]  No charge

## 2022-05-13 NOTE — H&P (Signed)
TRH H&P   Patient Demographics:    Nathaniel Woods, is a 88 y.o. male  MRN: [id number]   DOB - [birth year]  Admit Date - 05/13/2022  Outpatient Primary MD for the patient is Derinda Late, MD  Referring MD/NP/PA: Dr Marcelino Scot  Patient coming from: [facility]  No chief complaint on file.     HPI:    Nathaniel Woods  is a 88 y.o. male, ith medical history significant for coronary artery disease, chronic systolic CHF, CVA, atrial fibrillation on Eliquis, hypertension, dyslipidemia and type 2 diabetes mellitus ,multiple falls, with recent admission at [facility] for fall, where he was discharged 12/2, apparently patient fell again at home today, patient currently is in PACU, very poor historian,  so history was obtained from ED records, and previous medical records, and son by phone ,apparently patient lives with his son, and daughter-in-law, he had a fall where he stated his legs were weak, does not appear to be associated with any loss of consciousness, dizziness or lightheadedness.  Patient has been complaining of right leg pain, where it was noted to be shortened, and rotated, he did have skin tears on the left dorsal hand as well. -In ED workup was significant for right proximal tibial fracture, ED physician at [facility] discussed with Dr. Ginette Pitman, who accepted the patient to be admitted to [facility] for surgical intervention, patient transferred directly to the OR, where he had surgical repair, he is currently in PACU with [facility] hospitalist consulted to admit.    Review of systems:    Unable to obtain a reliable review of system from the patient who is in PACU, confused, with dementia at baseline.  With Past History of the following :    Past Medical History:  Diagnosis Date   AAA (abdominal aortic aneurysm) without rupture ([place])    Acute renal failure superimposed on stage  3a chronic kidney disease ([place])    AICD (automatic cardioverter/defibrillator) present 12/17/2016   Angina    Anxiety    "just before major surgery"   Arthritis    Asthma    childhood   Basal cell carcinoma 10/07/2021   left superior ear, Exc 10/15/2021   BPH associated with nocturia    Chronic systolic CHF (congestive heart failure) ([place])    Coronary artery disease    Hemorrhagic stroke ([place]) 09/07/2008   S/P fall   HOH (hard of hearing)    Hyperlipidemia    Hypertension    Hyponatremia 05/08/2022   Inferior MI ([place]) 10/08/2011   Kidney stone    PAF (paroxysmal atrial fibrillation) (HCC)    Sinus bradycardia    Stroke ([place])    Type II diabetes mellitus ([place])    Improved with weight loss   Wears hearing aid in both ears       Past Surgical History:  Procedure Laterality Date   ABDOMINAL AORTIC  ANEURYSM REPAIR  2013   APPENDECTOMY  06/09/1945   CARDIAC CATHETERIZATION  10/08/2011   CATARACT EXTRACTION W/PHACO Right 11/05/2021   Procedure: CATARACT EXTRACTION PHACO AND INTRAOCULAR LENS PLACEMENT ([place]) RIGHT;  Surgeon: Birder Robson, MD;  Location: [place];  Service: Ophthalmology;  Laterality: Right;  6.83 0:45.5   CATARACT EXTRACTION W/PHACO Left 11/19/2021   Procedure: CATARACT EXTRACTION PHACO AND INTRAOCULAR LENS PLACEMENT ([facility]) LEFT 11.02 01:02.8;  Surgeon: Birder Robson, MD;  Location: [place];  Service: Ophthalmology;  Laterality: Left;   CORONARY ANGIOPLASTY WITH STENT PLACEMENT  06/10/2007   "2; made total of 6"   CORONARY ARTERY BYPASS GRAFT  10/24/2011   Procedure: CORONARY ARTERY BYPASS GRAFTING (CABG);  Surgeon: Ivin Poot, MD;  Location: [place];  Service: Open Heart Surgery;  Laterality: N/A;  Coronary Artery Bypass Graft times four using the left internal mammary artery and the right and left greater saphenous veins harvested endoscopically.  transesophageal Echocardiogram   ICD IMPLANT  12/17/2016   INTERTROCHANTERIC HIP  FRACTURE SURGERY  05/10/1991   "pin, 2 screws, plate"; right   LITHOTRIPSY  ~ 04/2011   TONSILLECTOMY  06/09/1942   VASECTOMY  ~ 1970      Social History:     Social History   Tobacco Use   Smoking status: Former    Packs/day: 1.00    Years: 24.00    Total pack years: 24.00    Types: Cigarettes    Quit date: 06/09/1970    Years since quitting: 51.9   Smokeless tobacco: Never  Substance Use Topics   Alcohol use: Not Currently    Alcohol/week: 28.0 standard drinks of alcohol    Types: 28 Shots of liquor per week    Comment: None for several years        Family History :     Family History  Problem Relation Age of Onset   Breast cancer Sister       Home Medications:   Prior to Admission medications   Medication Sig Start Date End Date Taking? Authorizing Provider  acetaminophen (TYLENOL) 325 MG tablet Take 2 tablets (650 mg total) by mouth every 6 (six) hours as needed for mild pain (or Fever >/= 101). 03/30/19   Gouru, Illene Silver, MD  amitriptyline (ELAVIL) 50 MG tablet Take 50 mg by mouth at bedtime.    [provider]  apixaban (ELIQUIS) 5 MG TABS tablet Take 5 mg by mouth 2 (two) times daily.     [provider]  Calcium Carbonate-Vit D-Min (CALCIUM 600+D3 PLUS MINERALS) 600-800 MG-UNIT TABS Take 1 tablet by mouth every evening.    [provider]  citalopram (CELEXA) 20 MG tablet Take 20 mg by mouth every evening.    [provider]  Coenzyme Q10 (COQ10) 200 MG CAPS Take 200 mg by mouth every evening.    [provider]  finasteride (PROSCAR) 5 MG tablet Take 5 mg by mouth every evening.    [provider]  latanoprost (XALATAN) 0.005 % ophthalmic solution Place 1 drop into both eyes at bedtime.  Patient not taking: Reported on 05/08/2022    [provider]  lidocaine (LIDODERM) 5 % Place 1 patch onto the skin daily. Remove & Discard patch within 12 hours or as directed by MD 05/10/22   Nicole Kindred A, DO   metoprolol succinate (TOPROL-XL) 25 MG 24 hr tablet Take 25 mg by mouth daily.    [provider]  montelukast (SINGULAIR) 10 MG tablet  Take 10 mg by mouth at bedtime.     [provider]  rosuvastatin (CRESTOR) 5 MG tablet Take 5 mg by mouth every Monday, Wednesday, and Friday. (Evening)    [provider]  traMADol (ULTRAM) 50 MG tablet Take 1 tablet (50 mg total) by mouth every 6 (six) hours as needed for moderate pain (despite tylenol). 05/10/22   Ezekiel Slocumb, DO     Allergies:     Allergies  Allergen Reactions   Levofloxacin Rash   Ambien [Zolpidem Tartrate] Other (See Comments)    Hallucinations and per pt he walked around at night and doesn't remember.   Aspirin Other (See Comments)    Hemorrhagic Stroke   Bee Venom Other (See Comments)    Reaction: fever   Chlorpheniramine Other (See Comments)    Reaction: unknown   Decongestant [Pseudoephedrine Hcl] Other (See Comments)    Reaction: unknown   Simvastatin Other (See Comments)    Other reaction(s): Muscle Pain   Zolpidem Other (See Comments)    Other reaction(s): Unknown "went bananas"   Contrast Media [Iodinated Contrast Media] Rash     Physical Exam:   Vitals  Blood pressure (!) 114/56, pulse 65, temperature (!) 97.2 F (36.2 C), resp. rate 15, height '6\' 3"'$  (1.905 m), weight 83 kg, SpO2 98 %.   1. General  chronically ill-appearing frail elderly male, laying in bed, in no apparent distress  2.  Patient is awake, opens eyes to verbal stimuli, answers only to his name.  3. No F.N deficits, ALL C.Nerves Intact, no gross focal deficits  4. Ears and Eyes appear Normal, Conjunctivae clear, PERRLA. Moist Oral Mucosa.  5. Supple Neck, No JVD, No cervical lymphadenopathy appriciated, No Carotid Bruits.  6. Symmetrical Chest wall movement, Good air movement bilaterally, CTAB.  7. RRR, No Gallops, Rubs or Murmurs, No Parasternal Heave.  8. Positive Bowel Sounds, Abdomen Soft, No  tenderness, No organomegaly appriciated,No rebound -guarding or rigidity.  9.  No Cyanosis, Normal Skin Turgor, No Skin Rash or Bruise.  10.  Right lower extremity bandaged, his toes with good capillary refills, warm  Data Review:    CBC Recent Labs  Lab 05/08/22 1621 05/09/22 0558 05/12/22 0724  WBC 8.7 7.7 11.1*  HGB 10.2* 9.4* 9.5*  HCT 30.8* 29.6* 29.7*  PLT 131* 132* 166  MCV 102.0* 102.1* 101.7*  MCH 33.8 32.4 32.5  MCHC 33.1 31.8 32.0  RDW 14.0 13.8 14.0  LYMPHSABS 1.0  --  0.8  MONOABS 0.9  --  0.9  EOSABS 0.4  --  0.1  BASOSABS 0.1  --  0.1   ------------------------------------------------------------------------------------------------------------------  Chemistries  Recent Labs  Lab 05/08/22 1621 05/09/22 0558 05/10/22 0844 05/12/22 0724  NA 132* 136 137 135  K 4.1 3.8 4.3 4.5  CL 107 112* 111 109  CO2 22 19* 22 20*  GLUCOSE 124* 89 93 128*  BUN 27* '20 21 19  '$ CREATININE 1.32* 1.09 1.20 1.24  CALCIUM 8.3* 8.3* 8.5* 8.7*  AST 20  --   --   --   ALT 12  --   --   --   ALKPHOS 52  --   --   --   BILITOT 0.8  --   --   --    ------------------------------------------------------------------------------------------------------------------ estimated creatinine clearance is 45.6 mL/min (by C-G formula based on SCr of 1.24 mg/dL). ------------------------------------------------------------------------------------------------------------------ No results for input(s): "TSH", "T4TOTAL", "T3FREE", "THYROIDAB" in the last 72 hours.  Invalid input(s): "FREET3"  Coagulation profile Recent Labs  Lab 05/12/22 0724  INR 1.9*   ------------------------------------------------------------------------------------------------------------------- No results for input(s): "DDIMER" in the last 72 hours. -------------------------------------------------------------------------------------------------------------------  Cardiac Enzymes No results for input(s):  "CKMB", "TROPONINI", "MYOGLOBIN" in the last 168 hours.  Invalid input(s): "CK" ------------------------------------------------------------------------------------------------------------------ No results found for: "BNP"   ---------------------------------------------------------------------------------------------------------------  Urinalysis    Component Value Date/Time   COLORURINE YELLOW (A) 05/08/2022 1751   APPEARANCEUR CLEAR (A) 05/08/2022 1751   LABSPEC 1.015 05/08/2022 1751   PHURINE 5.0 05/08/2022 1751   GLUCOSEU NEGATIVE 05/08/2022 1751   HGBUR NEGATIVE 05/08/2022 1751   BILIRUBINUR NEGATIVE 05/08/2022 1751   KETONESUR NEGATIVE 05/08/2022 1751   PROTEINUR NEGATIVE 05/08/2022 1751   UROBILINOGEN 0.2 10/28/2011 0947   NITRITE NEGATIVE 05/08/2022 1751   LEUKOCYTESUR SMALL (A) 05/08/2022 1751    ----------------------------------------------------------------------------------------------------------------   Imaging Results:    DG Tibia/Fibula Right  Result Date: 05/13/2022 CLINICAL DATA:  Open reduction internal fixation right tibia fracture. Intraoperative fluoroscopy. EXAM: RIGHT TIBIA AND FIBULA - 2 VIEW COMPARISON:  Right knee radiographs 05/12/2022 FINDINGS: Images were performed intraoperatively without the presence of a radiologist. The patient is undergoing lateral plate and screw fixation of the previously seen angulated and mildly displaced proximal tibial metaphyseal and diaphyseal fracture. There is improved alignment. Total fluoroscopy images: 7 Total fluoroscopy time: 30 seconds Total dose: Radiation Exposure Index (as provided by the fluoroscopic device): 2.75 mGy air Kerma Please see intraoperative findings for further detail. IMPRESSION: Intraoperative fluoroscopy provided for lateral plate and screw fixation of the proximal tibial fracture. Electronically Signed   By: Yvonne Kendall M.D.   On: 05/13/2022 17:49   DG C-Arm 1-60 Min-No Report  Result Date:  05/13/2022 Fluoroscopy was utilized by the requesting physician.  No radiographic interpretation.   CT KNEE RIGHT WO CONTRAST  Result Date: 05/13/2022 CLINICAL DATA:  Proximal tibial fracture. EXAM: CT OF THE RIGHT KNEE WITHOUT CONTRAST TECHNIQUE: Multidetector CT imaging of the right knee was performed according to the standard protocol. Multiplanar CT image reconstructions were also generated. RADIATION DOSE REDUCTION: This exam was performed according to the departmental dose-optimization program which includes automated exposure control, adjustment of the mA and/or kV according to patient size and/or use of iterative reconstruction technique. COMPARISON:  Radiograph performed May 12, 2022 FINDINGS: Bones/Joint/Cartilage There is a mildly displaced comminuted fracture of the proximal tibial metadiaphysis. There is a proximally 1.2 cm anterior displacement and medial angulation of the distal fracture fragment. There is intra-articular extension of the fracture with fracture line extends into the lateral tibial plateau. There is also mildly displaced fracture of the fibular head and neck. There is no appreciable joint effusion. Patella and distal femur appear intact. Ligaments Suboptimally assessed by CT. Muscles and Tendons Generalized muscle atrophy. No intramuscular hematoma or fluid collection. Soft tissues Subcutaneous soft tissue edema about anterior aspect of the tibia. No fluid collection or hematoma. IMPRESSION: 1. Mildly displaced comminuted fracture of the proximal tibial metadiaphysis with intra-articular extension. 2. Mildly displaced fracture of the fibular head and neck. Electronically Signed   By: Keane Police D.O.   On: 05/13/2022 12:16   DG Knee 1-2 Views Right  Result Date: 05/12/2022 CLINICAL DATA:  Right knee pain after unwitnessed fall today. EXAM: RIGHT KNEE - 1-2 VIEW COMPARISON:  Right knee radiographs 05/08/2022 FINDINGS: There is diffuse decreased bone mineralization. Mild  motion artifact on the frontal and oblique views. There is a transverse fracture of the proximal tibial metadiaphysis with mild medial apex angulation. Up to 7  mm anterior cortical step-off of the tibial tubercle at the anterior aspect of the fracture. Otherwise, no significant displacement. Evaluation of the joint spaces is limited by obliquity of the views, however there appears to be moderate to severe lateral compartment, moderate medial compartment, and moderate patellofemoral compartment joint space narrowing. Mild chronic enthesopathic change at the quadriceps and patellar insertions on the patella. No knee joint effusion. Surgical clips overlie the posteromedial distal thigh and knee. Left knee surgical clips are also noted within the partially visualized left lower extremity. Moderate to high-grade atherosclerotic calcifications. IMPRESSION: 1. Acute transverse fracture of the proximal tibial metadiaphysis with mild medial apex angulation. 2. Tricompartmental osteoarthritis, moderate to severe within the lateral compartment. Electronically Signed   By: Yvonne Kendall M.D.   On: 05/12/2022 08:25   DG Chest Portable 1 View  Result Date: 05/12/2022 CLINICAL DATA:  Unwitnessed fall this morning. EXAM: PORTABLE CHEST 1 VIEW COMPARISON:  AP chest 04/01/2022, 02/23/2022 FINDINGS: Status post median sternotomy and CABG. Cardiac silhouette is again moderately enlarged. Mild-to-moderate calcification within the aortic arch. Horizontal linear right midlung chronic scarring is unchanged from multiple prior radiographs. No new focal airspace opacity. No pulmonary edema, pleural effusion, pneumothorax. Mild dextrocurvature of the midthoracic spine with mild-to-moderate multilevel degenerative disc changes. IMPRESSION: 1. No acute cardiopulmonary process. 2. Cardiomegaly. 3. Chronic right midlung scarring. Electronically Signed   By: Yvonne Kendall M.D.   On: 05/12/2022 08:23   DG Hand Complete Left  Result Date:  05/12/2022 CLINICAL DATA:  Fall.  Left hand pain.  Laceration to left hand. EXAM: LEFT HAND - COMPLETE 3+ VIEW COMPARISON:  None Available. FINDINGS: There is diffuse decreased bone mineralization. Severe second through fourth greater than fifth DIP, severe thumb interphalangeal, moderate to severe second through fifth PIP, moderate to severe first and second metacarpophalangeal, and mild third through fifth metacarpophalangeal joint space narrowing and peripheral osteophytosis. Severe thumb carpometacarpal joint space narrowing, subchondral sclerosis, and peripheral osteophytosis. Severe triscaphe joint space narrowing. No acute fracture is seen.  No dislocation. IMPRESSION: 1. No acute fracture is seen. 2. Severe interphalangeal, thumb carpometacarpal, and triscaphe osteoarthritis. Electronically Signed   By: Yvonne Kendall M.D.   On: 05/12/2022 08:22   DG Hip Unilat W or Wo Pelvis 2-3 Views Right  Result Date: 05/12/2022 CLINICAL DATA:  Fall.  Right hip pain. EXAM: DG HIP (WITH OR WITHOUT PELVIS) 2-3V RIGHT COMPARISON:  Pelvis and right hip radiographs 03/28/2019 FINDINGS: There is again high-grade diffuse decreased bone mineralization. Within this limitation, no acute fracture is identified. Moderate to severe superior right femoroacetabular joint space narrowing with peripheral acetabular and femoral head-neck junction degenerative osteophytes. Interval healing of the acute fracture previously seen within the right greater trochanter. Redemonstration of right proximal femoral compression screw. No perihardware lucency is seen to indicate hardware failure or loosening. Chronic medial displacement of partially fused right lesser trochanter fracture. Moderate superior left femoroacetabular joint space narrowing and peripheral osteophytosis. Mild bilateral sacroiliac subchondral sclerosis. The pubic symphysis joint space is maintained. Partial visualization of aorto bi-iliac vascular stents. IMPRESSION: 1.  Within the limitations of diffuse decreased bone mineralization, no acute fracture is identified within the pelvis or right hip. 2. Moderate to severe right femoroacetabular osteoarthritis. 3. Moderate left femoroacetabular osteoarthritis. Electronically Signed   By: Yvonne Kendall M.D.   On: 05/12/2022 08:20   CT HEAD WO CONTRAST (5MM)  Result Date: 05/12/2022 CLINICAL DATA:  Fall EXAM: CT HEAD WITHOUT CONTRAST CT CERVICAL SPINE WITHOUT CONTRAST TECHNIQUE: Multidetector CT imaging  of the head and cervical spine was performed following the standard protocol without intravenous contrast. Multiplanar CT image reconstructions of the cervical spine were also generated. RADIATION DOSE REDUCTION: This exam was performed according to the departmental dose-optimization program which includes automated exposure control, adjustment of the mA and/or kV according to patient size and/or use of iterative reconstruction technique. COMPARISON:  CT head and cervical spine 05/08/2022 FINDINGS: CT HEAD FINDINGS Brain: There is no acute intracranial hemorrhage, extra-axial fluid collection, or acute infarct. Parenchymal volume is stable. The ventricles are stable in size. Background chronic small-vessel ischemic change is stable. There is no mass lesion.  There is no mass effect or midline shift. Vascular: There is calcification of the bilateral carotid siphons and vertebral arteries. Skull: Normal. Negative for fracture or focal lesion. Sinuses/Orbits: The imaged paranasal sinuses are clear. Bilateral lens implants are in place. The globes and orbits are otherwise unremarkable. Other: None. CT CERVICAL SPINE FINDINGS Alignment: There is no significant antero or retrolisthesis. There is no jumped or perched facet or other evidence of traumatic malalignment. Skull base and vertebrae: Skull base alignment is maintained. Vertebral body heights are preserved. There is no evidence of acute fracture. There is no suspicious osseous lesion.  Soft tissues and spinal canal: No prevertebral fluid or swelling. No visible canal hematoma. Disc levels: There is disc space narrowing and degenerative endplate change most advanced at C5-C6 and C6-C7. Facet arthropathy is most advanced on the right at C4-C5. Upper chest: The lung apices are not imaged. Other: There is calcified plaque at the carotid bifurcations, left worse than right. IMPRESSION: 1. Stable noncontrast head CT with no acute intracranial pathology. 2. No acute fracture or traumatic malalignment of the cervical spine. Electronically Signed   By: Valetta Mole M.D.   On: 05/12/2022 07:56   CT Cervical Spine Wo Contrast  Result Date: 05/12/2022 CLINICAL DATA:  Fall EXAM: CT HEAD WITHOUT CONTRAST CT CERVICAL SPINE WITHOUT CONTRAST TECHNIQUE: Multidetector CT imaging of the head and cervical spine was performed following the standard protocol without intravenous contrast. Multiplanar CT image reconstructions of the cervical spine were also generated. RADIATION DOSE REDUCTION: This exam was performed according to the departmental dose-optimization program which includes automated exposure control, adjustment of the mA and/or kV according to patient size and/or use of iterative reconstruction technique. COMPARISON:  CT head and cervical spine 05/08/2022 FINDINGS: CT HEAD FINDINGS Brain: There is no acute intracranial hemorrhage, extra-axial fluid collection, or acute infarct. Parenchymal volume is stable. The ventricles are stable in size. Background chronic small-vessel ischemic change is stable. There is no mass lesion.  There is no mass effect or midline shift. Vascular: There is calcification of the bilateral carotid siphons and vertebral arteries. Skull: Normal. Negative for fracture or focal lesion. Sinuses/Orbits: The imaged paranasal sinuses are clear. Bilateral lens implants are in place. The globes and orbits are otherwise unremarkable. Other: None. CT CERVICAL SPINE FINDINGS Alignment: There  is no significant antero or retrolisthesis. There is no jumped or perched facet or other evidence of traumatic malalignment. Skull base and vertebrae: Skull base alignment is maintained. Vertebral body heights are preserved. There is no evidence of acute fracture. There is no suspicious osseous lesion. Soft tissues and spinal canal: No prevertebral fluid or swelling. No visible canal hematoma. Disc levels: There is disc space narrowing and degenerative endplate change most advanced at C5-C6 and C6-C7. Facet arthropathy is most advanced on the right at C4-C5. Upper chest: The lung apices are not imaged. Other: There  is calcified plaque at the carotid bifurcations, left worse than right. IMPRESSION: 1. Stable noncontrast head CT with no acute intracranial pathology. 2. No acute fracture or traumatic malalignment of the cervical spine. Electronically Signed   By: Valetta Mole M.D.   On: 05/12/2022 07:56    My personal review of EKG: Rhythm Paced Rhythm, Rate  81 /min, QTc 524  Assessment & Plan:    Principal Problem:   Right tibial fracture Active Problems:   Recurrent falls   Paroxysmal atrial fibrillation (HCC)   Type II diabetes mellitus (HCC)   Dyslipidemia   Coronary artery disease   HTN (hypertension)   S/P CABG x 4   Chronic systolic CHF (congestive heart failure) (H[facility])   AICD (automatic cardioverter/defibrillator) present   BPH (benign prostatic hyperplasia)   Fall  Right proximal tibial fracture  Recurrent falls -Patient is admitted to medical telemetry bed -S/p surgical repair by Dr. Marcelino Scot -Discussed with surgical team, okay to start on Eliquis in a.m. -Continue with as needed pain regimen. -PT/OT consulted, likely will need SNF  Parox A-fib -Patient with known history of paroxysmal A-fib, he is currently with paced rhythm -CHA2DS2-VASc score> 2, I will resume anticoagulation Eliquis in a.m -Continue with metoprolol  Hypertension -Currently blood pressure is soft in PACU,  will resume his Toprol-XL and a.m  Diabetes mellitus, type II -Not appear to be on any medication, will continue to monitor CBG closely, and if elevated will start on insulin sliding scale.   Depression -Continue with home medication includes Celexa and Remeron  BPH -Continue with Proscar  Asthma, chronic -no active wheezing currently, continue with Singulair, and as needed albuterol  History of CAD -Continue with Toprol-XL, statin, he is on Eliquis, not on aspirin as he is on Eliquis.  Dyslipidemia -Continue with statin  Depression - We will continue as Celexa and Remeron   BPH (benign prostatic hyperplasia) - We will continue as Proscar   Asthma, chronic - We will continue his Singulair and place him on as needed DuoNebs.   Coronary artery disease - We will continue his Toprol-XL and statin therapy.   Dyslipidemia - We will continue statin therapy. - We will check fasting lipids.   DVT Prophylaxis: Eliquis   AM Labs Ordered, also please review Full Orders  Family Communication: Admission, patients condition and plan of care including tests being ordered have been discussed with son [name] by phone who indicate understanding and agree with the plan and Code Status.  Code Status Full  Likely DC to  SNF  Condition GUARDED    Consults called: ortho    Admission status: inpatient    Time spent in minutes : 70 minutes   Phillips Climes M.D on 05/13/2022 at 6:10 PM   [facility] - Office  [phone]

## 2022-05-13 NOTE — Anesthesia Procedure Notes (Signed)
Procedure Name: Intubation Date/Time: 05/13/2022 3:36 PM  Performed by: Thelma Comp, CRNAPre-anesthesia Checklist: Patient identified, Emergency Drugs available, Suction available and Patient being monitored Patient Re-evaluated:Patient Re-evaluated prior to induction Oxygen Delivery Method: Circle System Utilized Preoxygenation: Pre-oxygenation with 100% oxygen Induction Type: IV induction Ventilation: Mask ventilation without difficulty Laryngoscope Size: Mac and 4 Grade View: Grade I Tube type: Oral Tube size: 7.5 mm Number of attempts: 1 Airway Equipment and Method: Stylet Placement Confirmation: ETT inserted through vocal cords under direct vision, positive ETCO2 and breath sounds checked- equal and bilateral Secured at: 23 cm Tube secured with: Tape Dental Injury: Teeth and Oropharynx as per pre-operative assessment

## 2022-05-13 NOTE — Consult Note (Signed)
Initial Consultation Note     Patient: Nathaniel Woods RCV:[id number] DOB: [birth year] PCP: Derinda Late, MD       [place]   PATIENT NAME: Nathaniel Woods    MR#:  [id number]  DATE OF BIRTH:  1928/09/08  DATE OF ADMISSION:  05/12/2022  PRIMARY CARE PHYSICIAN: Derinda Late, MD   Patient is coming from: Home  REQUESTING/REFERRING PHYSICIAN: Ward, Delice Bison, DO  CHIEF COMPLAINT:   Chief Complaint  Patient presents with   Fall    HISTORY OF PRESENT ILLNESS:  Nathaniel Woods is a 88 y.o. Caucasian male with medical history significant for coronary artery disease, chronic systolic CHF, CVA, atrial fibrillation on Eliquis, hypertension, dyslipidemia and type 2 diabetes mellitus as well as an anxiety and asthma, presented to the emergency room with acute onset of recent recurrent falls.  The patient was just admitted here and discharged 12/2 for similar reason.  He fell again after he went home.  He stated that his legs got weak.  The patient lives with his son and daughter-in-law.  He apparently got up and was using his walker his legs gave out before he fell.  He is complaining of right hip pain and it was shortened and rotated per EMS.  He also had a skin tear to the left dorsal hand and was complaining of pain there.  He received 75 mcg of IV fentanyl in route to the hospital.  He had a head injury without loss of consciousness.  No fever or chills.  No nausea or vomiting or abdominal pain.  His right leg was placed in a right lacks 10 and his left hand skin tear was dressed.  Consult was made to Dr. Posey Pronto and recommendation was for transfer to [facility].  As x-ray revealed fracture of the proximal tibia metadiaphysis with possible fracture of the tibial tubercle as well and right hip radiographs suggest a healed prior hip fracture with fixation from a sliding hip screw.  Dr. Posey Pronto did not think the patient has compartment syndrome.  He recommended right leg splint.   ED  Course: Latest vital signs revealed a blood pressure of 145/61 with otherwise normal vital signs.  Labs revealed on 12/4 a CO2 of 20 with otherwise unremarkable BMP.  He has high sensitive troponin I was 35 and later went up to 67 and then settled around 63 within 3 hours.  CBC showed leukocytosis of 11.1 with mild anemia and neutrophilia.  INR was 1.9 and PT 21.7 yesterday and Eliquis was held off.  Blood group was A- with positive antibody screen. EKG as reviewed by me : EKG showed AV dual paced rhythm with some inhibition with a rate of 81 with T wave inversion anteroseptally. Imaging: Portable chest x-ray showed no acute cardiopulmonary disease, cardiomegaly and chronic right midlung scarring.  Left hand x-ray showed severe interphalangeal thumb carpometacarpal and triscaphe gave osteoarthritis with no acute fracture.   Right knee x-ray showed the following: 1. Acute transverse fracture of the proximal tibial metadiaphysis with mild medial apex angulation. 2. Tricompartmental osteoarthritis, moderate to severe within the lateral compartment.  Contact was made with Dr. Altamese  who accepted to have the patient transferred from the ER here to the [place] at [facility] as there were no beds available at [facility].  I was consulted by Dr. Leonides Schanz to assess the patient for increasing troponin levels and preoperative clearance. PAST MEDICAL HISTORY:   Past Medical History:  Diagnosis Date   AAA (abdominal aortic  aneurysm) without rupture (HCC)    Acute renal failure superimposed on stage 3a chronic kidney disease ([place])    AICD (automatic cardioverter/defibrillator) present 12/17/2016   Angina    Anxiety    "just before major surgery"   Arthritis    Asthma    childhood   Basal cell carcinoma 10/07/2021   left superior ear, Exc 10/15/2021   BPH associated with nocturia    Chronic systolic CHF (congestive heart failure) ([place])    Coronary artery disease    Hemorrhagic stroke ([place]) 09/07/2008   S/P fall    HOH (hard of hearing)    Hyperlipidemia    Hypertension    Hyponatremia 05/08/2022   Inferior MI ([place]) 10/08/2011   Kidney stone    PAF (paroxysmal atrial fibrillation) ([place])    Sinus bradycardia    Stroke ([place])    Type II diabetes mellitus ([place])    Improved with weight loss   Wears hearing aid in both ears     PAST SURGICAL HISTORY:   Past Surgical History:  Procedure Laterality Date   ABDOMINAL AORTIC ANEURYSM REPAIR  2013   APPENDECTOMY  06/09/1945   CARDIAC CATHETERIZATION  10/08/2011   CATARACT EXTRACTION W/PHACO Right 11/05/2021   Procedure: CATARACT EXTRACTION PHACO AND INTRAOCULAR LENS PLACEMENT ([place]) RIGHT;  Surgeon: Birder Robson, MD;  Location: [place];  Service: Ophthalmology;  Laterality: Right;  6.83 0:45.5   CATARACT EXTRACTION W/PHACO Left 11/19/2021   Procedure: CATARACT EXTRACTION PHACO AND INTRAOCULAR LENS PLACEMENT ([facility]) LEFT 11.02 01:02.8;  Surgeon: Birder Robson, MD;  Location: [place];  Service: Ophthalmology;  Laterality: Left;   CORONARY ANGIOPLASTY WITH STENT PLACEMENT  06/10/2007   "2; made total of 6"   CORONARY ARTERY BYPASS GRAFT  10/24/2011   Procedure: CORONARY ARTERY BYPASS GRAFTING (CABG);  Surgeon: Ivin Poot, MD;  Location: [place];  Service: Open Heart Surgery;  Laterality: N/A;  Coronary Artery Bypass Graft times four using the left internal mammary artery and the right and left greater saphenous veins harvested endoscopically.  transesophageal Echocardiogram   ICD IMPLANT  12/17/2016   INTERTROCHANTERIC HIP FRACTURE SURGERY  05/10/1991   "pin, 2 screws, plate"; right   LITHOTRIPSY  ~ 04/2011   TONSILLECTOMY  06/09/1942   VASECTOMY  ~ 1970    SOCIAL HISTORY:   Social History   Tobacco Use   Smoking status: Former    Packs/day: 1.00    Years: 24.00    Total pack years: 24.00    Types: Cigarettes    Quit date: 06/09/1970    Years since quitting: 51.9   Smokeless tobacco: Never  Substance Use Topics    Alcohol use: Not Currently    Alcohol/week: 28.0 standard drinks of alcohol    Types: 28 Shots of liquor per week    Comment: None for several years    FAMILY HISTORY:   Family History  Problem Relation Age of Onset   Breast cancer Sister     DRUG ALLERGIES:   Allergies  Allergen Reactions   Levofloxacin Rash   Ambien [Zolpidem Tartrate] Other (See Comments)    Hallucinations and per pt he walked around at night and doesn't remember.   Aspirin Other (See Comments)    Hemorrhagic Stroke   Bee Venom Other (See Comments)    Reaction: fever   Chlorpheniramine Other (See Comments)    Reaction: unknown   Decongestant [Pseudoephedrine Hcl] Other (See Comments)    Reaction: unknown   Simvastatin Other (See Comments)  Other reaction(s): Muscle Pain   Zolpidem Other (See Comments)    Other reaction(s): Unknown "went bananas"   Contrast Media [Iodinated Contrast Media] Rash    REVIEW OF SYSTEMS:   ROS As per history of present illness. All pertinent systems were reviewed above. Constitutional, HEENT, cardiovascular, respiratory, GI, GU, musculoskeletal, neuro, psychiatric, endocrine, integumentary and hematologic systems were reviewed and are otherwise negative/unremarkable except for positive findings mentioned above in the HPI.   MEDICATIONS AT HOME:   Prior to Admission medications   Medication Sig Start Date End Date Taking? Authorizing Provider  amitriptyline (ELAVIL) 50 MG tablet Take 50 mg by mouth at bedtime.   Yes [provider]  apixaban (ELIQUIS) 5 MG TABS tablet Take 5 mg by mouth 2 (two) times daily.    Yes [provider]  citalopram (CELEXA) 20 MG tablet Take 20 mg by mouth every evening.   Yes [provider]  Coenzyme Q10 (COQ10) 200 MG CAPS Take 200 mg by mouth every evening.   Yes [provider]  finasteride (PROSCAR) 5 MG tablet Take 5 mg by mouth every evening.   Yes [provider]  metoprolol succinate  (TOPROL-XL) 25 MG 24 hr tablet Take 25 mg by mouth daily.   Yes [provider]  montelukast (SINGULAIR) 10 MG tablet Take 10 mg by mouth at bedtime.    Yes [provider]  rosuvastatin (CRESTOR) 5 MG tablet Take 5 mg by mouth every Monday, Wednesday, and Friday. (Evening)   Yes [provider]  traMADol (ULTRAM) 50 MG tablet Take 1 tablet (50 mg total) by mouth every 6 (six) hours as needed for moderate pain (despite tylenol). 05/10/22  Yes Ezekiel Slocumb, DO  acetaminophen (TYLENOL) 325 MG tablet Take 2 tablets (650 mg total) by mouth every 6 (six) hours as needed for mild pain (or Fever >/= 101). 03/30/19   Nicholes Mango, MD  Calcium Carbonate-Vit D-Min (CALCIUM 600+D3 PLUS MINERALS) 600-800 MG-UNIT TABS Take 1 tablet by mouth every evening.    [provider]  latanoprost (XALATAN) 0.005 % ophthalmic solution Place 1 drop into both eyes at bedtime.  Patient not taking: Reported on 05/08/2022    [provider]  lidocaine (LIDODERM) 5 % Place 1 patch onto the skin daily. Remove & Discard patch within 12 hours or as directed by MD 05/10/22   Ezekiel Slocumb, DO      VITAL SIGNS:  Blood pressure (!) 145/61, pulse 72, temperature 98.1 F (36.7 C), temperature source Oral, resp. rate 14, height '6\' 3"'$  (1.905 m), weight 83 kg, SpO2 100 %.  PHYSICAL EXAMINATION:  Physical Exam  GENERAL:  88 y.o.-year-old patient lying in the bed with no acute distress.  EYES: Pupils equal, round, reactive to light and accommodation. No scleral icterus. Extraocular muscles intact.  HEENT: Head atraumatic, normocephalic. Oropharynx and nasopharynx clear.  NECK:  Supple, no jugular venous distention. No thyroid enlargement, no tenderness.  LUNGS: Normal breath sounds bilaterally, no wheezing, rales,rhonchi or crepitation. No use of accessory muscles of respiration.  CARDIOVASCULAR: Regular rate and rhythm, S1, S2 normal. No murmurs, rubs, or gallops.  ABDOMEN: Soft,  nondistended, nontender. Bowel sounds present. No organomegaly or mass.  EXTREMITIES: No pedal edema, cyanosis, or clubbing.  NEUROLOGIC: Cranial nerves II through XII are intact. Muscle strength 5/5 in all extremities. Sensation intact. Gait not checked.  PSYCHIATRIC: The patient is alert and oriented x 3.  Normal affect and good eye contact. SKIN: No obvious rash, lesion,  or ulcer.   LABORATORY PANEL:   CBC Recent Labs  Lab 05/12/22 0724  WBC 11.1*  HGB 9.5*  HCT 29.7*  PLT 166   ------------------------------------------------------------------------------------------------------------------  Chemistries  Recent Labs  Lab 05/08/22 1621 05/09/22 0558 05/12/22 0724  NA 132*   < > 135  K 4.1   < > 4.5  CL 107   < > 109  CO2 22   < > 20*  GLUCOSE 124*   < > 128*  BUN 27*   < > 19  CREATININE 1.32*   < > 1.24  CALCIUM 8.3*   < > 8.7*  AST 20  --   --   ALT 12  --   --   ALKPHOS 52  --   --   BILITOT 0.8  --   --    < > = values in this interval not displayed.   ------------------------------------------------------------------------------------------------------------------  Cardiac Enzymes No results for input(s): "TROPONINI" in the last 168 hours. ------------------------------------------------------------------------------------------------------------------  RADIOLOGY:  DG Knee 1-2 Views Right  Result Date: 05/12/2022 CLINICAL DATA:  Right knee pain after unwitnessed fall today. EXAM: RIGHT KNEE - 1-2 VIEW COMPARISON:  Right knee radiographs 05/08/2022 FINDINGS: There is diffuse decreased bone mineralization. Mild motion artifact on the frontal and oblique views. There is a transverse fracture of the proximal tibial metadiaphysis with mild medial apex angulation. Up to 7 mm anterior cortical step-off of the tibial tubercle at the anterior aspect of the fracture. Otherwise, no significant displacement. Evaluation of the joint spaces is limited by obliquity of the  views, however there appears to be moderate to severe lateral compartment, moderate medial compartment, and moderate patellofemoral compartment joint space narrowing. Mild chronic enthesopathic change at the quadriceps and patellar insertions on the patella. No knee joint effusion. Surgical clips overlie the posteromedial distal thigh and knee. Left knee surgical clips are also noted within the partially visualized left lower extremity. Moderate to high-grade atherosclerotic calcifications. IMPRESSION: 1. Acute transverse fracture of the proximal tibial metadiaphysis with mild medial apex angulation. 2. Tricompartmental osteoarthritis, moderate to severe within the lateral compartment. Electronically Signed   By: Yvonne Kendall M.D.   On: 05/12/2022 08:25   DG Chest Portable 1 View  Result Date: 05/12/2022 CLINICAL DATA:  Unwitnessed fall this morning. EXAM: PORTABLE CHEST 1 VIEW COMPARISON:  AP chest 04/01/2022, 02/23/2022 FINDINGS: Status post median sternotomy and CABG. Cardiac silhouette is again moderately enlarged. Mild-to-moderate calcification within the aortic arch. Horizontal linear right midlung chronic scarring is unchanged from multiple prior radiographs. No new focal airspace opacity. No pulmonary edema, pleural effusion, pneumothorax. Mild dextrocurvature of the midthoracic spine with mild-to-moderate multilevel degenerative disc changes. IMPRESSION: 1. No acute cardiopulmonary process. 2. Cardiomegaly. 3. Chronic right midlung scarring. Electronically Signed   By: Yvonne Kendall M.D.   On: 05/12/2022 08:23   DG Hand Complete Left  Result Date: 05/12/2022 CLINICAL DATA:  Fall.  Left hand pain.  Laceration to left hand. EXAM: LEFT HAND - COMPLETE 3+ VIEW COMPARISON:  None Available. FINDINGS: There is diffuse decreased bone mineralization. Severe second through fourth greater than fifth DIP, severe thumb interphalangeal, moderate to severe second through fifth PIP, moderate to severe first and  second metacarpophalangeal, and mild third through fifth metacarpophalangeal joint space narrowing and peripheral osteophytosis. Severe thumb carpometacarpal joint space narrowing, subchondral sclerosis, and peripheral osteophytosis. Severe triscaphe joint space narrowing. No acute fracture is seen.  No dislocation. IMPRESSION: 1. No acute fracture is seen. 2. Severe interphalangeal, thumb carpometacarpal,  and triscaphe osteoarthritis. Electronically Signed   By: Yvonne Kendall M.D.   On: 05/12/2022 08:22   DG Hip Unilat W or Wo Pelvis 2-3 Views Right  Result Date: 05/12/2022 CLINICAL DATA:  Fall.  Right hip pain. EXAM: DG HIP (WITH OR WITHOUT PELVIS) 2-3V RIGHT COMPARISON:  Pelvis and right hip radiographs 03/28/2019 FINDINGS: There is again high-grade diffuse decreased bone mineralization. Within this limitation, no acute fracture is identified. Moderate to severe superior right femoroacetabular joint space narrowing with peripheral acetabular and femoral head-neck junction degenerative osteophytes. Interval healing of the acute fracture previously seen within the right greater trochanter. Redemonstration of right proximal femoral compression screw. No perihardware lucency is seen to indicate hardware failure or loosening. Chronic medial displacement of partially fused right lesser trochanter fracture. Moderate superior left femoroacetabular joint space narrowing and peripheral osteophytosis. Mild bilateral sacroiliac subchondral sclerosis. The pubic symphysis joint space is maintained. Partial visualization of aorto bi-iliac vascular stents. IMPRESSION: 1. Within the limitations of diffuse decreased bone mineralization, no acute fracture is identified within the pelvis or right hip. 2. Moderate to severe right femoroacetabular osteoarthritis. 3. Moderate left femoroacetabular osteoarthritis. Electronically Signed   By: Yvonne Kendall M.D.   On: 05/12/2022 08:20   CT HEAD WO CONTRAST (5MM)  Result Date:  05/12/2022 CLINICAL DATA:  Fall EXAM: CT HEAD WITHOUT CONTRAST CT CERVICAL SPINE WITHOUT CONTRAST TECHNIQUE: Multidetector CT imaging of the head and cervical spine was performed following the standard protocol without intravenous contrast. Multiplanar CT image reconstructions of the cervical spine were also generated. RADIATION DOSE REDUCTION: This exam was performed according to the departmental dose-optimization program which includes automated exposure control, adjustment of the mA and/or kV according to patient size and/or use of iterative reconstruction technique. COMPARISON:  CT head and cervical spine 05/08/2022 FINDINGS: CT HEAD FINDINGS Brain: There is no acute intracranial hemorrhage, extra-axial fluid collection, or acute infarct. Parenchymal volume is stable. The ventricles are stable in size. Background chronic small-vessel ischemic change is stable. There is no mass lesion.  There is no mass effect or midline shift. Vascular: There is calcification of the bilateral carotid siphons and vertebral arteries. Skull: Normal. Negative for fracture or focal lesion. Sinuses/Orbits: The imaged paranasal sinuses are clear. Bilateral lens implants are in place. The globes and orbits are otherwise unremarkable. Other: None. CT CERVICAL SPINE FINDINGS Alignment: There is no significant antero or retrolisthesis. There is no jumped or perched facet or other evidence of traumatic malalignment. Skull base and vertebrae: Skull base alignment is maintained. Vertebral body heights are preserved. There is no evidence of acute fracture. There is no suspicious osseous lesion. Soft tissues and spinal canal: No prevertebral fluid or swelling. No visible canal hematoma. Disc levels: There is disc space narrowing and degenerative endplate change most advanced at C5-C6 and C6-C7. Facet arthropathy is most advanced on the right at C4-C5. Upper chest: The lung apices are not imaged. Other: There is calcified plaque at the carotid  bifurcations, left worse than right. IMPRESSION: 1. Stable noncontrast head CT with no acute intracranial pathology. 2. No acute fracture or traumatic malalignment of the cervical spine. Electronically Signed   By: Valetta Mole M.D.   On: 05/12/2022 07:56   CT Cervical Spine Wo Contrast  Result Date: 05/12/2022 CLINICAL DATA:  Fall EXAM: CT HEAD WITHOUT CONTRAST CT CERVICAL SPINE WITHOUT CONTRAST TECHNIQUE: Multidetector CT imaging of the head and cervical spine was performed following the standard protocol without intravenous contrast. Multiplanar CT image reconstructions of the cervical  spine were also generated. RADIATION DOSE REDUCTION: This exam was performed according to the departmental dose-optimization program which includes automated exposure control, adjustment of the mA and/or kV according to patient size and/or use of iterative reconstruction technique. COMPARISON:  CT head and cervical spine 05/08/2022 FINDINGS: CT HEAD FINDINGS Brain: There is no acute intracranial hemorrhage, extra-axial fluid collection, or acute infarct. Parenchymal volume is stable. The ventricles are stable in size. Background chronic small-vessel ischemic change is stable. There is no mass lesion.  There is no mass effect or midline shift. Vascular: There is calcification of the bilateral carotid siphons and vertebral arteries. Skull: Normal. Negative for fracture or focal lesion. Sinuses/Orbits: The imaged paranasal sinuses are clear. Bilateral lens implants are in place. The globes and orbits are otherwise unremarkable. Other: None. CT CERVICAL SPINE FINDINGS Alignment: There is no significant antero or retrolisthesis. There is no jumped or perched facet or other evidence of traumatic malalignment. Skull base and vertebrae: Skull base alignment is maintained. Vertebral body heights are preserved. There is no evidence of acute fracture. There is no suspicious osseous lesion. Soft tissues and spinal canal: No prevertebral  fluid or swelling. No visible canal hematoma. Disc levels: There is disc space narrowing and degenerative endplate change most advanced at C5-C6 and C6-C7. Facet arthropathy is most advanced on the right at C4-C5. Upper chest: The lung apices are not imaged. Other: There is calcified plaque at the carotid bifurcations, left worse than right. IMPRESSION: 1. Stable noncontrast head CT with no acute intracranial pathology. 2. No acute fracture or traumatic malalignment of the cervical spine. Electronically Signed   By: Valetta Mole M.D.   On: 05/12/2022 07:56      IMPRESSION AND PLAN:  Assessment and Plan: Closed right tibial fracture - The patient will be transferred for ORIF to [facility]. - She was accepted by Dr. Altamese [place]. - He has elevated troponin I that are currently settled.  I suspect demand ischemia.  I do not think that he is developing non-STEMI.  He has been chest pain-free.  It showed no evidence for arrhythmias. - We will obtain a cardiology consult this a.m. - I notified Dr. Nehemiah Massed about the patient. - We will continue statin therapy. - We will continue beta-blocker therapy and both should provide preoperative cardiovascular risk reduction. - The patient has a history of coronary artery disease and diabetes mellitus but not on insulin, with history of CHF and CVA and no history of of renal failure with a creatinine more than 2.  It is considered moderate to high risk for perioperative cardiovascular events though he is currently clinically stable.  The patient has been having recurrent falls with subsequent head injury without loss of consciousness.  Paroxysmal atrial fibrillation (HCC) - We will continue his Toprol-XL that should provide perioperative cardiovascular risk reduction. - Eliquis will obviously be held off for expected operative intervention. - He will need to come off Eliquis ultimately specially that he continues to have recurrent falls.   Type II diabetes mellitus  ([place]) - He can be placed on he on supplemental coverage with NovoLog.   Essential hypertension - We will continue his Toprol-XL.   Depression - We will continue as Celexa and Remeron   BPH (benign prostatic hyperplasia) - We will continue as Proscar   Asthma, chronic - We will continue his Singulair and place him on as needed DuoNebs.   Coronary artery disease - We will continue his Toprol-XL and statin therapy.   Dyslipidemia -  We will continue statin therapy. - We will check fasting lipids.   DVT prophylaxis: SCDs.  Medical prophylaxis should be delayed to postoperative period. Advanced Care Planning:  Code Status: full code. Family Communication:  The plan of care was discussed in details with the patient (and family). I answered all questions. The patient agreed to proceed with the above mentioned plan. Further management will depend upon hospital course. Disposition Plan: Transfer to [facility] for [facility]IF. Consults called: Dr. Marcelino Scot with Ortho pediatrics at [facility] was consulted over the phone and accepted the patient for transfer All the records are reviewed and case discussed with ED provider.                             Dispo: The patient is from: Home and will be transferred to [facility] for surgical intervention  Thank you Dr. Leonides Schanz for allowing me to participate in the care of this very pleasant gentleman.               Christel Mormon M.D on 05/13/2022 at 5:07 AM  [facility]   From 7 PM-7 AM, contact night-coverage [email or web address]  CC: Primary care physician; Derinda Late, MD

## 2022-05-13 NOTE — ED Notes (Signed)
Pt repositioned and boosted in bed.

## 2022-05-13 NOTE — ED Notes (Signed)
Writer called patient's son [name] to inquire about make of pacemaker/defibrillator. [name] believes it is [employer].

## 2022-05-13 NOTE — ED Notes (Signed)
Pt asleep, no acute distress noted.

## 2022-05-13 NOTE — ED Notes (Signed)
Son, Jerid Catherman, updated about transfer.

## 2022-05-13 NOTE — ED Notes (Signed)
Ward, MD used doppler to find pulse in right foot. Writer at bedside

## 2022-05-13 NOTE — Consult Note (Signed)
Orthopaedic Trauma Service (OTS) Consultation   Patient ID: Nathaniel Woods MRN: [id number] DOB/AGE: 02/04/1929 88 y.o.   Reason for Consult: Right proximal tibia fracture Referring Physician: Leim Fabry, MD  HPI: Nathaniel Woods is an 88 y.o. male who fell at home where he lives with his wife, uses a walker, taken to [facility] where Dr. Posey Pronto stated that the patient should be transferred; that specifically it was out of his scope of practice and that the injury would be best managed by an orthopaedic traumatologist. We accepted the patient for transfer but unable to come before now because no beds. Patient is in long leg splint, well padded but flexed 70. Pain severe with motion, well controlled at rest. Hard of hearing and hearing aids are out. Several medical problems and associated meds could be contributing to frequent falls. Stay in a facility post discharge is anticipated.  Past Medical History:  Diagnosis Date   AAA (abdominal aortic aneurysm) without rupture (HCC)    Acute renal failure superimposed on stage 3a chronic kidney disease ([place])    AICD (automatic cardioverter/defibrillator) present 12/17/2016   Angina    Anxiety    "just before major surgery"   Arthritis    Asthma    childhood   Basal cell carcinoma 10/07/2021   left superior ear, Exc 10/15/2021   BPH associated with nocturia    Chronic systolic CHF (congestive heart failure) ([place])    Coronary artery disease    Hemorrhagic stroke ([place]) 09/07/2008   S/P fall   HOH (hard of hearing)    Hyperlipidemia    Hypertension    Hyponatremia 05/08/2022   Inferior MI ([place]) 10/08/2011   Kidney stone    PAF (paroxysmal atrial fibrillation) ([place])    Sinus bradycardia    Stroke ([place])    Type II diabetes mellitus ([place])    Improved with weight loss   Wears hearing aid in both ears     Past Surgical History:  Procedure Laterality Date   ABDOMINAL AORTIC ANEURYSM REPAIR  2013   APPENDECTOMY  06/09/1945    CARDIAC CATHETERIZATION  10/08/2011   CATARACT EXTRACTION W/PHACO Right 11/05/2021   Procedure: CATARACT EXTRACTION PHACO AND INTRAOCULAR LENS PLACEMENT ([place]) RIGHT;  Surgeon: Birder Robson, MD;  Location: [facility];  Service: Ophthalmology;  Laterality: Right;  6.83 0:45.5   CATARACT EXTRACTION W/PHACO Left 11/19/2021   Procedure: CATARACT EXTRACTION PHACO AND INTRAOCULAR LENS PLACEMENT ([facility]) LEFT 11.02 01:02.8;  Surgeon: Birder Robson, MD;  Location: [facility];  Service: Ophthalmology;  Laterality: Left;   CORONARY ANGIOPLASTY WITH STENT PLACEMENT  06/10/2007   "2; made total of 6"   CORONARY ARTERY BYPASS GRAFT  10/24/2011   Procedure: CORONARY ARTERY BYPASS GRAFTING (CABG);  Surgeon: Ivin Poot, MD;  Location: [facility];  Service: Open Heart Surgery;  Laterality: N/A;  Coronary Artery Bypass Graft times four using the left internal mammary artery and the right and left greater saphenous veins harvested endoscopically.  transesophageal Echocardiogram   ICD IMPLANT  12/17/2016   INTERTROCHANTERIC HIP FRACTURE SURGERY  05/10/1991   "pin, 2 screws, plate"; right   LITHOTRIPSY  ~ 04/2011   TONSILLECTOMY  06/09/1942   VASECTOMY  ~ 1970    Family History  Problem Relation Age of Onset   Breast cancer Sister     Social History:  reports that he quit smoking about 51 years ago. His smoking use included cigarettes. He has a 24.00 pack-year smoking  history. He has never used smokeless tobacco. He reports that he does not currently use alcohol after a past usage of about 28.0 standard drinks of alcohol per week. He reports that he does not use drugs. Patient has not drank in 4-5 years.  Allergies:  Allergies  Allergen Reactions   Levofloxacin Rash   Ambien [Zolpidem Tartrate] Other (See Comments)    Hallucinations and per pt he walked around at night and doesn't remember.   Aspirin Other (See Comments)    Hemorrhagic Stroke   Bee Venom Other (See Comments)     Reaction: fever   Chlorpheniramine Other (See Comments)    Reaction: unknown   Decongestant [Pseudoephedrine Hcl] Other (See Comments)    Reaction: unknown   Simvastatin Other (See Comments)    Other reaction(s): Muscle Pain   Zolpidem Other (See Comments)    Other reaction(s): Unknown "went bananas"   Contrast Media [Iodinated Contrast Media] Rash    Medications: Prior to Admission:  Medications Prior to Admission  Medication Sig Dispense Refill Last Dose   acetaminophen (TYLENOL) 325 MG tablet Take 2 tablets (650 mg total) by mouth every 6 (six) hours as needed for mild pain (or Fever >/= 101).      amitriptyline (ELAVIL) 50 MG tablet Take 50 mg by mouth at bedtime.      apixaban (ELIQUIS) 5 MG TABS tablet Take 5 mg by mouth 2 (two) times daily.    05/11/2022 at 2000   Calcium Carbonate-Vit D-Min (CALCIUM 600+D3 PLUS MINERALS) 600-800 MG-UNIT TABS Take 1 tablet by mouth every evening.      citalopram (CELEXA) 20 MG tablet Take 20 mg by mouth every evening.      Coenzyme Q10 (COQ10) 200 MG CAPS Take 200 mg by mouth every evening.      finasteride (PROSCAR) 5 MG tablet Take 5 mg by mouth every evening.      latanoprost (XALATAN) 0.005 % ophthalmic solution Place 1 drop into both eyes at bedtime.  (Patient not taking: Reported on 05/08/2022)  2    lidocaine (LIDODERM) 5 % Place 1 patch onto the skin daily. Remove & Discard patch within 12 hours or as directed by MD 30 patch 0    metoprolol succinate (TOPROL-XL) 25 MG 24 hr tablet Take 25 mg by mouth daily.      montelukast (SINGULAIR) 10 MG tablet Take 10 mg by mouth at bedtime.       rosuvastatin (CRESTOR) 5 MG tablet Take 5 mg by mouth every Monday, Wednesday, and Friday. (Evening)      traMADol (ULTRAM) 50 MG tablet Take 1 tablet (50 mg total) by mouth every 6 (six) hours as needed for moderate pain (despite tylenol). 30 tablet 0     Results for orders placed or performed during the hospital encounter of 05/13/22 (from the past 48  hour(s))  Glucose, capillary     Status: Abnormal   Collection Time: 05/13/22  2:16 PM  Result Value Ref Range   Glucose-Capillary 139 (H) 70 - 99 mg/dL    Comment: Glucose reference range applies only to samples taken after fasting for at least 8 hours.    CT KNEE RIGHT WO CONTRAST  Result Date: 05/13/2022 CLINICAL DATA:  Proximal tibial fracture. EXAM: CT OF THE RIGHT KNEE WITHOUT CONTRAST TECHNIQUE: Multidetector CT imaging of the right knee was performed according to the standard protocol. Multiplanar CT image reconstructions were also generated. RADIATION DOSE REDUCTION: This exam was performed according to the departmental dose-optimization program  which includes automated exposure control, adjustment of the mA and/or kV according to patient size and/or use of iterative reconstruction technique. COMPARISON:  Radiograph performed May 12, 2022 FINDINGS: Bones/Joint/Cartilage There is a mildly displaced comminuted fracture of the proximal tibial metadiaphysis. There is a proximally 1.2 cm anterior displacement and medial angulation of the distal fracture fragment. There is intra-articular extension of the fracture with fracture line extends into the lateral tibial plateau. There is also mildly displaced fracture of the fibular head and neck. There is no appreciable joint effusion. Patella and distal femur appear intact. Ligaments Suboptimally assessed by CT. Muscles and Tendons Generalized muscle atrophy. No intramuscular hematoma or fluid collection. Soft tissues Subcutaneous soft tissue edema about anterior aspect of the tibia. No fluid collection or hematoma. IMPRESSION: 1. Mildly displaced comminuted fracture of the proximal tibial metadiaphysis with intra-articular extension. 2. Mildly displaced fracture of the fibular head and neck. Electronically Signed   By: Keane Police D.O.   On: 05/13/2022 12:16   DG Knee 1-2 Views Right  Result Date: 05/12/2022 CLINICAL DATA:  Right knee pain after  unwitnessed fall today. EXAM: RIGHT KNEE - 1-2 VIEW COMPARISON:  Right knee radiographs 05/08/2022 FINDINGS: There is diffuse decreased bone mineralization. Mild motion artifact on the frontal and oblique views. There is a transverse fracture of the proximal tibial metadiaphysis with mild medial apex angulation. Up to 7 mm anterior cortical step-off of the tibial tubercle at the anterior aspect of the fracture. Otherwise, no significant displacement. Evaluation of the joint spaces is limited by obliquity of the views, however there appears to be moderate to severe lateral compartment, moderate medial compartment, and moderate patellofemoral compartment joint space narrowing. Mild chronic enthesopathic change at the quadriceps and patellar insertions on the patella. No knee joint effusion. Surgical clips overlie the posteromedial distal thigh and knee. Left knee surgical clips are also noted within the partially visualized left lower extremity. Moderate to high-grade atherosclerotic calcifications. IMPRESSION: 1. Acute transverse fracture of the proximal tibial metadiaphysis with mild medial apex angulation. 2. Tricompartmental osteoarthritis, moderate to severe within the lateral compartment. Electronically Signed   By: Yvonne Kendall M.D.   On: 05/12/2022 08:25   DG Chest Portable 1 View  Result Date: 05/12/2022 CLINICAL DATA:  Unwitnessed fall this morning. EXAM: PORTABLE CHEST 1 VIEW COMPARISON:  AP chest 04/01/2022, 02/23/2022 FINDINGS: Status post median sternotomy and CABG. Cardiac silhouette is again moderately enlarged. Mild-to-moderate calcification within the aortic arch. Horizontal linear right midlung chronic scarring is unchanged from multiple prior radiographs. No new focal airspace opacity. No pulmonary edema, pleural effusion, pneumothorax. Mild dextrocurvature of the midthoracic spine with mild-to-moderate multilevel degenerative disc changes. IMPRESSION: 1. No acute cardiopulmonary process. 2.  Cardiomegaly. 3. Chronic right midlung scarring. Electronically Signed   By: Yvonne Kendall M.D.   On: 05/12/2022 08:23   DG Hand Complete Left  Result Date: 05/12/2022 CLINICAL DATA:  Fall.  Left hand pain.  Laceration to left hand. EXAM: LEFT HAND - COMPLETE 3+ VIEW COMPARISON:  None Available. FINDINGS: There is diffuse decreased bone mineralization. Severe second through fourth greater than fifth DIP, severe thumb interphalangeal, moderate to severe second through fifth PIP, moderate to severe first and second metacarpophalangeal, and mild third through fifth metacarpophalangeal joint space narrowing and peripheral osteophytosis. Severe thumb carpometacarpal joint space narrowing, subchondral sclerosis, and peripheral osteophytosis. Severe triscaphe joint space narrowing. No acute fracture is seen.  No dislocation. IMPRESSION: 1. No acute fracture is seen. 2. Severe interphalangeal, thumb carpometacarpal, and triscaphe  osteoarthritis. Electronically Signed   By: Yvonne Kendall M.D.   On: 05/12/2022 08:22   DG Hip Unilat W or Wo Pelvis 2-3 Views Right  Result Date: 05/12/2022 CLINICAL DATA:  Fall.  Right hip pain. EXAM: DG HIP (WITH OR WITHOUT PELVIS) 2-3V RIGHT COMPARISON:  Pelvis and right hip radiographs 03/28/2019 FINDINGS: There is again high-grade diffuse decreased bone mineralization. Within this limitation, no acute fracture is identified. Moderate to severe superior right femoroacetabular joint space narrowing with peripheral acetabular and femoral head-neck junction degenerative osteophytes. Interval healing of the acute fracture previously seen within the right greater trochanter. Redemonstration of right proximal femoral compression screw. No perihardware lucency is seen to indicate hardware failure or loosening. Chronic medial displacement of partially fused right lesser trochanter fracture. Moderate superior left femoroacetabular joint space narrowing and peripheral osteophytosis. Mild  bilateral sacroiliac subchondral sclerosis. The pubic symphysis joint space is maintained. Partial visualization of aorto bi-iliac vascular stents. IMPRESSION: 1. Within the limitations of diffuse decreased bone mineralization, no acute fracture is identified within the pelvis or right hip. 2. Moderate to severe right femoroacetabular osteoarthritis. 3. Moderate left femoroacetabular osteoarthritis. Electronically Signed   By: Yvonne Kendall M.D.   On: 05/12/2022 08:20   CT HEAD WO CONTRAST (5MM)  Result Date: 05/12/2022 CLINICAL DATA:  Fall EXAM: CT HEAD WITHOUT CONTRAST CT CERVICAL SPINE WITHOUT CONTRAST TECHNIQUE: Multidetector CT imaging of the head and cervical spine was performed following the standard protocol without intravenous contrast. Multiplanar CT image reconstructions of the cervical spine were also generated. RADIATION DOSE REDUCTION: This exam was performed according to the departmental dose-optimization program which includes automated exposure control, adjustment of the mA and/or kV according to patient size and/or use of iterative reconstruction technique. COMPARISON:  CT head and cervical spine 05/08/2022 FINDINGS: CT HEAD FINDINGS Brain: There is no acute intracranial hemorrhage, extra-axial fluid collection, or acute infarct. Parenchymal volume is stable. The ventricles are stable in size. Background chronic small-vessel ischemic change is stable. There is no mass lesion.  There is no mass effect or midline shift. Vascular: There is calcification of the bilateral carotid siphons and vertebral arteries. Skull: Normal. Negative for fracture or focal lesion. Sinuses/Orbits: The imaged paranasal sinuses are clear. Bilateral lens implants are in place. The globes and orbits are otherwise unremarkable. Other: None. CT CERVICAL SPINE FINDINGS Alignment: There is no significant antero or retrolisthesis. There is no jumped or perched facet or other evidence of traumatic malalignment. Skull base and  vertebrae: Skull base alignment is maintained. Vertebral body heights are preserved. There is no evidence of acute fracture. There is no suspicious osseous lesion. Soft tissues and spinal canal: No prevertebral fluid or swelling. No visible canal hematoma. Disc levels: There is disc space narrowing and degenerative endplate change most advanced at C5-C6 and C6-C7. Facet arthropathy is most advanced on the right at C4-C5. Upper chest: The lung apices are not imaged. Other: There is calcified plaque at the carotid bifurcations, left worse than right. IMPRESSION: 1. Stable noncontrast head CT with no acute intracranial pathology. 2. No acute fracture or traumatic malalignment of the cervical spine. Electronically Signed   By: Valetta Mole M.D.   On: 05/12/2022 07:56   CT Cervical Spine Wo Contrast  Result Date: 05/12/2022 CLINICAL DATA:  Fall EXAM: CT HEAD WITHOUT CONTRAST CT CERVICAL SPINE WITHOUT CONTRAST TECHNIQUE: Multidetector CT imaging of the head and cervical spine was performed following the standard protocol without intravenous contrast. Multiplanar CT image reconstructions of the cervical spine were  also generated. RADIATION DOSE REDUCTION: This exam was performed according to the departmental dose-optimization program which includes automated exposure control, adjustment of the mA and/or kV according to patient size and/or use of iterative reconstruction technique. COMPARISON:  CT head and cervical spine 05/08/2022 FINDINGS: CT HEAD FINDINGS Brain: There is no acute intracranial hemorrhage, extra-axial fluid collection, or acute infarct. Parenchymal volume is stable. The ventricles are stable in size. Background chronic small-vessel ischemic change is stable. There is no mass lesion.  There is no mass effect or midline shift. Vascular: There is calcification of the bilateral carotid siphons and vertebral arteries. Skull: Normal. Negative for fracture or focal lesion. Sinuses/Orbits: The imaged paranasal  sinuses are clear. Bilateral lens implants are in place. The globes and orbits are otherwise unremarkable. Other: None. CT CERVICAL SPINE FINDINGS Alignment: There is no significant antero or retrolisthesis. There is no jumped or perched facet or other evidence of traumatic malalignment. Skull base and vertebrae: Skull base alignment is maintained. Vertebral body heights are preserved. There is no evidence of acute fracture. There is no suspicious osseous lesion. Soft tissues and spinal canal: No prevertebral fluid or swelling. No visible canal hematoma. Disc levels: There is disc space narrowing and degenerative endplate change most advanced at C5-C6 and C6-C7. Facet arthropathy is most advanced on the right at C4-C5. Upper chest: The lung apices are not imaged. Other: There is calcified plaque at the carotid bifurcations, left worse than right. IMPRESSION: 1. Stable noncontrast head CT with no acute intracranial pathology. 2. No acute fracture or traumatic malalignment of the cervical spine. Electronically Signed   By: Valetta Mole M.D.   On: 05/12/2022 07:56    Intake/Output    None      ROS recent UTI assoc with mental status changes, frequent falls, recent diarrhea which has exacerbated light headedness There were no vitals taken for this visit. Physical Exam NCAT, hard of hearing RLE Splint in place; knee flexed to 70 degees Blisters present laterally but small Dressing intact, clean, dry  Edema/ swelling controlled  Sens: DPN, SPN, TN intact  Motor: EHL, FHL, and lessor toe ext and flex all intact grossly  Brisk cap refill, warm to touch      Gait: could not assess Coordination and balance: could not assess   Assessment/Plan:  Right proximal tibia fracture  The risks and benefits of surgery were discussed with the patient's son, including the possibility of infection, nerve injury, vessel injury, wound breakdown, arthritis, symptomatic hardware, DVT/ PE, loss of motion, malunion,  nonunion, and need for further surgery among others.  We also specifically discussed the possible need to stage surgery because of the elevated risk of soft tissue breakdown that could lead to amputation.  These risks were acknowledged and consent provided to proceed.  Weightbearing: NWB RLE Insicional and dressing care: Reinforce dressings as needed Orthopedic device(s): none Showering: yes VTE prophylaxis: will resume Eliquis Pain control: Norco Follow - up plan: 2 weeks Contact information:  Altamese [place] MD, Ainsley Spinner PA-C   Altamese [place], MD [facility], [facility] [phone]  05/13/2022, 2:27 PM  [facility] [place] [id number] [phone] Jenetta [phone] (F)    After 5pm and on the weekends please log on to Amion, go to orthopaedics and the look under the [facility] Call for the provider(s) on call. You can also call our office at [phone] and then follow the prompts to be connected to the call team.

## 2022-05-13 NOTE — ED Provider Notes (Signed)
Patient's leg is a splint- warm and well perfused foot- soft compartments at time of transfer.    Nathaniel [place], MD 05/13/22 1249

## 2022-05-13 NOTE — ED Notes (Signed)
Spoke to [facility] stated patient set for a transfer at 1100 to go to [facility]  per Dr. Marcelino Scot   [phone]

## 2022-05-13 NOTE — Anesthesia Preprocedure Evaluation (Addendum)
Anesthesia Evaluation  Patient identified by MRN, date of birth, ID band Patient awake and Patient confused    Reviewed: Allergy & Precautions, NPO status , Patient's Chart, lab work & pertinent test results  History of Anesthesia Complications Negative for: history of anesthetic complications  Airway Mallampati: II  TM Distance: >3 FB Neck ROM: Full    Dental no notable dental hx.    Pulmonary asthma , former smoker   Pulmonary exam normal        Cardiovascular hypertension, Pt. on medications and Pt. on home beta blockers + CAD, + Past MI, + CABG and +CHF  Normal cardiovascular exam+ Cardiac Defibrillator   TTE 2018: EF 25-30%   Neuro/Psych   Anxiety Depression    CVA    GI/Hepatic negative GI ROS, Neg liver ROS,,,  Endo/Other  diabetes, Type 2    Renal/GU Renal InsufficiencyRenal disease     Musculoskeletal  (+) Arthritis ,    Abdominal   Peds  Hematology  (+) Blood dyscrasia (Hgb 9.5), anemia   Anesthesia Other Findings Day of surgery medications reviewed with patient.  Reproductive/Obstetrics                             Anesthesia Physical Anesthesia Plan  ASA: 4  Anesthesia Plan: General   Post-op Pain Management: Regional block* and Ofirmev IV (intra-op)*   Induction: Intravenous  PONV Risk Score and Plan: 2 and Treatment may vary due to age or medical condition, Dexamethasone and Ondansetron  Airway Management Planned: Oral ETT  Additional Equipment: ClearSight  Intra-op Plan:   Post-operative Plan: Extubation in OR  Informed Consent: I have reviewed the patients History and Physical, chart, labs and discussed the procedure including the risks, benefits and alternatives for the proposed anesthesia with the patient or authorized representative who has indicated his/her understanding and acceptance.     Dental advisory given and Consent reviewed with POA  Plan  Discussed with: CRNA  Anesthesia Plan Comments:        Anesthesia Quick Evaluation

## 2022-05-13 NOTE — Anesthesia Procedure Notes (Signed)
Anesthesia Regional Block: Popliteal block   Pre-Anesthetic Checklist: , timeout performed,  Correct Patient, Correct Site, Correct Laterality,  Correct Procedure, Correct Position, site marked,  Risks and benefits discussed,  Pre-op evaluation,  At surgeon's request and post-op pain management  Laterality: Right  Prep: Maximum Sterile Barrier Precautions used, chloraprep       Needles:  Injection technique: Single-shot  Needle Type: Echogenic Stimulator Needle     Needle Length: 9cm  Needle Gauge: 22     Additional Needles:   Procedures:,,,, ultrasound used (permanent image in chart),,    Narrative:  Start time: 05/13/2022 3:41 PM End time: 05/13/2022 3:44 PM Injection made incrementally with aspirations every 5 mL.  Performed by: Personally  Anesthesiologist: Brennan Bailey, MD  Additional Notes: Risks, benefits, and alternative discussed. Patient's POA gave consent for procedure. Patient prepped and draped in sterile fashion. Relevant anatomy identified with ultrasound guidance. Local anesthetic given in 5cc increments with no signs of intravascular injection. Patient monitored throughout procedure with signs of LAST or immediate complications. Tolerated well. Ultrasound image placed in chart.  Tawny Asal, MD

## 2022-05-13 NOTE — ED Notes (Signed)
Ward, MD notified of increase in troponin level from baseline. Obtaining repeat EKG per MD. Pt denies CP/SOB.

## 2022-05-13 NOTE — ED Notes (Signed)
New purewick placed.  

## 2022-05-13 NOTE — Transfer of Care (Signed)
Immediate Anesthesia Transfer of Care Note  Patient: Nathaniel Woods  Procedure(s) Performed: OPEN REDUCTION INTERNAL FIXATION (ORIF) TIBIA FRACTURE (Right: Leg Lower)  Patient Location: PACU  Anesthesia Type:General  Level of Consciousness: drowsy  Airway & Oxygen Therapy: Patient Spontanous Breathing and Patient connected to face mask oxygen  Post-op Assessment: Report given to RN and Post -op Vital signs reviewed and stable  Post vital signs: Reviewed and stable  Last Vitals:  Vitals Value Taken Time  BP 121/70 05/13/22 1722  Temp    Pulse 60 05/13/22 1726  Resp 13 05/13/22 1726  SpO2 100 % 05/13/22 1726  Vitals shown include unvalidated device data.  Last Pain:  Vitals:   05/13/22 1433  TempSrc: Axillary         Complications: No notable events documented.

## 2022-05-13 NOTE — ED Notes (Signed)
Pt went to CT

## 2022-05-13 NOTE — ED Notes (Signed)
Dressing on patient's left hand saturated with dry blood. Writer removed and replaced dressing. Skin tear on back of patient's hand has small amount of active bleeding when dressing placed.

## 2022-05-13 NOTE — ED Provider Notes (Signed)
5:00 AM  Pt had slightly rising troponin that has been flat.  EKG shows a paced rhythm.  He is not having any chest pain and no syncope that led to his fall.  Pacemaker was interrogated and demonstrated 147 atrial arrhythmia events since last interrogated at the end of October.  His last atrial tachycardia has been ongoing since 05/11/2022.  I did consult the hospitalist who is seen the patient to help with medical clearance given the patient is supposed to go to [facility] today for surgical intervention for his right tibial plateau fracture.   On my examination, patient is currently in a splint.  I have taken his splint down and his compartments are soft to touch.  He has a 2+ dopplerable pulse and is able to move his toes.  Normal capillary refill and reports normal sensation.  He does have pain with any flexion of his foot but when at rest pain is well controlled.  Patient sleeping on my arrival.  No current signs of compartment syndrome.  He is still NPO.   Paislyn Domenico, Delice Bison, DO 05/13/22 [phone]

## 2022-05-13 NOTE — Op Note (Signed)
05/13/2022 5:35 PM  PATIENT:  Nathaniel Woods  89 y.o. male [id number]  PRE-OPERATIVE DIAGNOSIS:   1. RIGHT BICONDYLAR TIBIAL PLATEAU FRACTURE 2. RIGHT LEG BULLAE  POST-OPERATIVE DIAGNOSIS:   1. RIGHT BICONDYLAR TIBIAL PLATEAU FRACTURE 2. RIGHT LEG BULLAE  PROCEDURE:  Procedure(s): 1. OPEN REDUCTION INTERNAL FIXATION (ORIF) BICONDYLAR TIBIAL PLATEAU RIGHT 2. ANTERIOR COMPARTMENT FASCIOTOMY 3. APPLICATION OF STRESS UNDER FLUOROSCOPY  SURGEON:  Surgeon(s) and Role:    Altamese Camano, MD - Primary  PHYSICIAN ASSISTANT: Ainsley Spinner, PA-C  ANESTHESIA:   general  EBL:  20 mL   BLOOD ADMINISTERED:none  DRAINS: none   LOCAL MEDICATIONS USED:  NONE  SPECIMEN: None  DISPOSITION OF SPECIMEN:  N/A  COUNTS:  YES  TOURNIQUET:  * No tourniquets in log *  DICTATION: Written below.  PLAN OF CARE: Admit to inpatient   PATIENT DISPOSITION:  PACU - hemodynamically stable.   Delay start of Pharmacological VTE agent (>24hrs) due to surgical blood loss or risk of bleeding: no     BRIEF SUMMARY AND INDICATION FOR PROCEDURE:  Patient is a 88 y.o.-year- old with a tibial plateau fracture involving medial and lateral condyles, treated provisionally with immediate immobilization, elevation, and active motion of the foot and toes to facilitate resolution of soft tissue swelling.  We did discuss with the patient's son the risks and benefits of surgical treatment including the potential for arthritis, nerve injury, vessel injury, loss of motion, DVT, PE, heart attack, stroke, symptomatic hardware, need for further surgery, and multiple others.  These risks were acknowledged and consent provided to proceed.   BRIEF SUMMARY OF PROCEDURE:  After administration of preoperative antibiotics, the patient was taken to the operating room.  General anesthesia was induced plus a regional nerve block and the lower extremity prepped and draped in usual sterile fashion using a chlorhexidine wash and  betadine scrub and paint. A timeout was performed. I then brought in the radiolucent triangle. A curvilinear incision was made extending laterally over Gerdy's tubercle. Dissection was carried down where the soft tissues were left intact to the lateral plateau and rim, elevated only the insertion of the extensors. I was then able to insert the 9 hole Zimmer NCB tibial plateau plate and pin it proximally at the joint line. My assistant pulled traction and derotated the fracture to achieve reduction. At this point, we placed standard screw fixation in the proximal row of the plate, which we would later convert to locked fixation by placing locking caps. I then placed four bicortical screws distally. As my assistant and I manipulated the fracture into appropriate reduction I then tightened the top row of screws and applied the locking caps. Final images showed appropriate reduction, implant position and length. All wounds were irrigated thoroughly. I then applied stress to the knee while under fluoro to confirm there was no ligamentous injury.   Prior to closure, I turned my attention to the distal edge of the wound here underneath the skin.  I used the long scissors to spread both superficial and deep to the anterior compartment.  The fascia was then released for 8 to 10 cm to reduce the likelihood of the postoperative compartment syndrome.  Once more, wound was irrigated and then a standard layered closure performed, 0 Vicryl, 2-0 Vicryl, and 2-0 nylon for the skin.  Sterile gently compressive dressing was applied.  The patient was taken to the PACU in stable condition.   Ainsley Spinner, PA-C was present and assisting throughout with all  portions of the procedure.   PROGNOSIS: The patient will not require formal bracing given the stable examination post fixation. Unrestricted range of motion will begin immediately.  He will be nonweightbearing on the operative extremity, be on pharmacologic DVT prophylaxis  with resumption of his Eliquis, and mobilized with PT and OT. After discharge, we will plan to see him back in about 2 weeks for removal of sutures.         Astrid Divine. Marcelino Scot, M.D.

## 2022-05-13 NOTE — Anesthesia Procedure Notes (Signed)
Anesthesia Regional Block: Adductor canal block   Pre-Anesthetic Checklist: , timeout performed,  Correct Patient, Correct Site, Correct Laterality,  Correct Procedure, Correct Position, site marked,  Risks and benefits discussed,  Pre-op evaluation,  At surgeon's request and post-op pain management  Laterality: Right  Prep: Maximum Sterile Barrier Precautions used, chloraprep       Needles:  Injection technique: Single-shot  Needle Type: Echogenic Stimulator Needle     Needle Length: 9cm  Needle Gauge: 22     Additional Needles:   Procedures:,,,, ultrasound used (permanent image in chart),,    Narrative:  Start time: 05/13/2022 3:38 PM End time: 05/13/2022 3:41 PM Injection made incrementally with aspirations every 5 mL.  Performed by: Personally  Anesthesiologist: Brennan Bailey, MD  Additional Notes: Risks, benefits, and alternative discussed. Patient's POA gave consent for procedure. Patient prepped and draped in sterile fashion. Relevant anatomy identified with ultrasound guidance. Local anesthetic given in 5cc increments with no signs of intravascular injection. Patient monitored throughout procedure with signs of LAST or immediate complications. Tolerated well. Ultrasound image placed in chart.  Tawny Asal, MD

## 2022-05-14 ENCOUNTER — Encounter (HOSPITAL_COMMUNITY): Payer: Self-pay | Admitting: Orthopedic Surgery

## 2022-05-14 DIAGNOSIS — S82191S Other fracture of upper end of right tibia, sequela: Secondary | ICD-10-CM | POA: Diagnosis not present

## 2022-05-14 LAB — CBC
HCT: 22.6 % — ABNORMAL LOW (ref 39.0–52.0)
Hemoglobin: 7.3 g/dL — ABNORMAL LOW (ref 13.0–17.0)
MCH: 33 pg (ref 26.0–34.0)
MCHC: 32.3 g/dL (ref 30.0–36.0)
MCV: 102.3 fL — ABNORMAL HIGH (ref 80.0–100.0)
Platelets: 142 10*3/uL — ABNORMAL LOW (ref 150–400)
RBC: 2.21 MIL/uL — ABNORMAL LOW (ref 4.22–5.81)
RDW: 14.3 % (ref 11.5–15.5)
WBC: 8.6 10*3/uL (ref 4.0–10.5)
nRBC: 0 % (ref 0.0–0.2)

## 2022-05-14 LAB — VITAMIN D 25 HYDROXY (VIT D DEFICIENCY, FRACTURES): Vit D, 25-Hydroxy: 39.73 ng/mL (ref 30–100)

## 2022-05-14 LAB — BPAM RBC
Blood Product Expiration Date: 202312162359
Blood Product Expiration Date: 202312162359
Unit Type and Rh: 600
Unit Type and Rh: 600

## 2022-05-14 LAB — GLUCOSE, CAPILLARY
Glucose-Capillary: 171 mg/dL — ABNORMAL HIGH (ref 70–99)
Glucose-Capillary: 146 mg/dL — ABNORMAL HIGH (ref 70–99)
Glucose-Capillary: 140 mg/dL — ABNORMAL HIGH (ref 70–99)
Glucose-Capillary: 131 mg/dL — ABNORMAL HIGH (ref 70–99)

## 2022-05-14 LAB — BASIC METABOLIC PANEL
Anion gap: 11 (ref 5–15)
BUN: 19 mg/dL (ref 8–23)
CO2: 20 mmol/L — ABNORMAL LOW (ref 22–32)
Calcium: 9.1 mg/dL (ref 8.9–10.3)
Chloride: 106 mmol/L (ref 98–111)
Creatinine, Ser: 1.23 mg/dL (ref 0.61–1.24)
GFR, Estimated: 55 mL/min — ABNORMAL LOW (ref >60)
Glucose, Bld: 197 mg/dL — ABNORMAL HIGH (ref 70–99)
Potassium: 5 mmol/L (ref 3.5–5.1)
Sodium: 137 mmol/L (ref 135–145)

## 2022-05-14 LAB — TYPE AND SCREEN
ABO/RH(D): A NEG
Antibody Screen: POSITIVE
Unit division: 0
Unit division: 0

## 2022-05-14 LAB — HEMOGLOBIN AND HEMATOCRIT, BLOOD
HCT: 22.4 % — ABNORMAL LOW (ref 39.0–52.0)
Hemoglobin: 7.5 g/dL — ABNORMAL LOW (ref 13.0–17.0)

## 2022-05-14 MED ORDER — INSULIN ASPART 100 UNIT/ML IJ SOLN: 0.0000 [IU] | Freq: Every day | INTRAMUSCULAR | Status: DC

## 2022-05-14 MED ORDER — ADULT MULTIVITAMIN W/MINERALS CH
1.0000 | ORAL_TABLET | Freq: Every day | ORAL | Status: DC
  Administered 2022-05-14 – 2022-05-20 (×7): 1 via ORAL
  Filled 2022-05-14 (×7): qty 1

## 2022-05-14 MED ORDER — ENSURE ENLIVE PO LIQD
237.0000 mL | Freq: Two times a day (BID) | ORAL | Status: DC
  Administered 2022-05-14 – 2022-05-20 (×8): 237 mL via ORAL

## 2022-05-14 MED ORDER — INSULIN ASPART 100 UNIT/ML IJ SOLN
0.0000 [IU] | Freq: Three times a day (TID) | INTRAMUSCULAR | Status: DC
  Administered 2022-05-14 (×2): 1 [IU] via SUBCUTANEOUS

## 2022-05-14 MED ORDER — APIXABAN 5 MG PO TABS
5.0000 mg | ORAL_TABLET | Freq: Two times a day (BID) | ORAL | Status: DC
  Administered 2022-05-14: 5 mg via ORAL

## 2022-05-14 NOTE — Progress Notes (Signed)
PT Cancellation Note  Patient Details Name: Nathaniel Woods MRN: [id number] DOB: April 13, 1929   Cancelled Treatment:    Reason Eval/Treat Not Completed: Attempted to initiate PT evaluation. Pt agitated and confused. Unwilling to participate with PT at this time. PT called son and discussed plans for d/c as pt unable to participate in d/c planning. Son reports pt has had several falls at home with several hospital admissions over the last 3 months. They have been managing with hired caregiver support coming in, but 24/7 physical assist not available. Anticipate pt will likely be at a lift vs wheelchair level given NWB status and PLOF. Son reports he would prefer SNF level rehab at [facility] as a first choice or [facility] as a second choice. CSW updated after call. Will check back as schedule allows to attempt mobility and complete initial evaluation as able.   Thelma Comp 05/14/2022, 11:50 AM  Rolinda Roan, PT, DPT Acute Rehabilitation Services Secure Chat Preferred Office: [phone]

## 2022-05-14 NOTE — Consult Note (Signed)
   [facility] Inpatient Consult   05/14/2022  Nathaniel Woods Sep 24, 1928 [id number]  [place] [facility] [ACO] Patient: [employer] PPO  Primary Care Provider:  Derinda Late, MD with [facility]  Patient screened for less than 7 days readmission hospitalization with noted medium risk score for unplanned readmission risk to assess for potential [place] Management service needs for post hospital transition for care coordination.  Review of patient's electronic medical record reveals patient is admitted from [facility]  3:05 pm On unit rounds patient is working with therapist.  Plan:  Continue to follow progress and disposition to assess for post hospital community care coordination/management needs.  Referral request for community care coordination:  following  Of note, [place] does not replace or interfere with any arrangements made by the Inpatient Transition of Care team.  For questions contact:   Natividad Brood, RN BSN [place]  [phone] business mobile phone Toll free office [phone]  *[place]  [phone] Fax number: [phone] [email or web address]

## 2022-05-14 NOTE — Progress Notes (Signed)
Initial Nutrition Assessment  DOCUMENTATION CODES:   Not applicable  INTERVENTION:  Liberalize diet from a heart healthy to a regular diet to provide widest variety of menu options to enhance nutritional adequacy Ensure Enlive po BID, each supplement provides 350 kcal and 20 grams of protein. MVI with minerals daily  NUTRITION DIAGNOSIS:   Increased nutrient needs related to post-op healing as evidenced by estimated needs.  GOAL:   Patient will meet greater than or equal to 90% of their needs  MONITOR:   PO intake, Supplement acceptance, Labs, Weight trends  REASON FOR ASSESSMENT:   Consult Hip fracture protocol  ASSESSMENT:   Pt admitted with R tibial fracture. Transferred from [facility] for surgical repair. PMH significant for CAD, chronic systolic CHF, CVA, afib, HTN, dyslipidemia, T2DM and multiple falls with recent d/c 12/2   Pt noted to have agitation and confusion today per PT note. Plans for SNF upon d/c.   Per H&P pt is a poor historian. Unable to obtain detailed nutrition related history via phone call to room at this time. Per flowsheet a/o x2.   No documented meal completions on file to review.   Reviewed weight history. It appears that within the last 7 months, pt's weight has increased from 74.8 kg to 83 kg. However, he is also noted to have moderate pitting BLE which could falsely elevated dry weight. Will continue to monitor throughout admission.  Medications: calcium-vitamin D, colace, SSI 0-5 units qhs, SSI 0-9 units TID  Labs: CBG's 133-171 x24 hours  NUTRITION - FOCUSED PHYSICAL EXAM: RD working remotely. Deferred to follow up.   Diet Order:   Diet Order             Diet regular Room service appropriate? Yes; Fluid consistency: Thin  Diet effective now                   EDUCATION NEEDS:   No education needs have been identified at this time  Skin:  Skin Assessment: Reviewed RN Assessment (R leg incision; S1 sacrum and S2 L buttock  (POA))  Last BM:  PTA  Height:   Ht Readings from Last 1 Encounters:  05/13/22 '6\' 3"'$  (1.905 m)    Weight:   Wt Readings from Last 1 Encounters:  05/13/22 83 kg   BMI:  Body mass index is 22.87 kg/m.  Estimated Nutritional Needs:   Kcal:  2050-2250  Protein:  105-120g  Fluid:  >/=2L  Clayborne Dana, RDN, LDN Clinical Nutrition

## 2022-05-14 NOTE — Progress Notes (Signed)
Orthopedic Tech Progress Note Patient Details:  Nathaniel Woods May 25, 1929 [id number]  Ortho Devices Type of Ortho Device: Bone foam zero knee Ortho Device/Splint Location: RLE Ortho Device/Splint Interventions: Application   Post Interventions Patient Tolerated: Well  Trisha Ken E Abigail Marsiglia 05/14/2022, 11:10 AM

## 2022-05-14 NOTE — Evaluation (Signed)
Physical Therapy Evaluation  Patient Details Name: Nathaniel Woods MRN: [id number] DOB: 05-Nov-1928 Today's Date: 05/14/2022  History of Present Illness  Pt is a 88 y/o male who presents from [facility] for ORIF of R tibial plateau fracture on 05/13/2022. He will be NWB on the RLE for 6 weeks. PMH significant for multiple recent admissions 2 falls, AAA, CKD III, AICD, CHF, CAD, HOH, HTN, MI, PAF, bradycardia, CVA, DM II, R hip fracture fixation 1992.   Clinical Impression  Pt admitted with above diagnosis. Pt currently with functional limitations due to the deficits listed below (see PT Problem List). Pt seen in afternoon after son arrived. Son helpful to encourage pt to participate. +2 assist required throughout session to achieve EOB. SNF level rehab will be required at d/c to maximize functional independence and safety to prepare for return home with caregiver support. Acutely, pt will benefit from skilled PT to increase their independence and safety with mobility to allow discharge to the venue listed below.          Recommendations for follow up therapy are one component of a multi-disciplinary discharge planning process, led by the attending physician.  Recommendations may be updated based on patient status, additional functional criteria and insurance authorization.  Follow Up Recommendations Skilled nursing-short term rehab (<3 hours/day) Can patient physically be transported by private vehicle: No    Assistance Recommended at Discharge Frequent or constant Supervision/Assistance  Patient can return home with the following  Help with stairs or ramp for entrance;Assist for transportation;Assistance with cooking/housework;Direct supervision/assist for medications management;A little help with walking and/or transfers;A little help with bathing/dressing/bathroom    Equipment Recommendations Other (comment) (TBD by next venue of care)  Recommendations for Other Services       Functional  Status Assessment Patient has had a recent decline in their functional status and demonstrates the ability to make significant improvements in function in a reasonable and predictable amount of time.     Precautions / Restrictions Precautions Precautions: Fall;Back Precaution Booklet Issued: No Restrictions Weight Bearing Restrictions: Yes RLE Weight Bearing: Non weight bearing      Mobility  Bed Mobility Overal bed mobility: Needs Assistance Bed Mobility: Sit to Supine     Supine to sit: Max assist, +2 for physical assistance, HOB elevated Sit to supine: Max assist, +2 for physical assistance, +2 for safety/equipment   General bed mobility comments: Assist for LE movement towards EOB. Hand over hand assist to reach for R railing with L hand. Bed pad utilized to scoot pt around to sit EOB. Therapist holding RLE up initially but able to be lowered to rest on the floor eventually. Heavy +2 for return to supine, with third person utilized for scoot up to [facility] to manage RLE.    Transfers                   General transfer comment: Deferred for safety due to weight bearing restrictions and decreased cognition to maintain.    Ambulation/Gait                  Stairs            Wheelchair Mobility    Modified Rankin (Stroke Patients Only)       Balance Overall balance assessment: History of Falls, Needs assistance Sitting-balance support: No upper extremity supported, Feet supported Sitting balance-Leahy Scale: Poor Sitting balance - Comments: Posterior lean initially requiring assistance to maintain upright posture. Improved to fair by end of  session.                                     Pertinent Vitals/Pain Pain Assessment Pain Assessment: Faces Faces Pain Scale: Hurts whole lot Breathing: normal Negative Vocalization: none Facial Expression: smiling or inexpressive Body Language: relaxed Consolability: no need to console PAINAD  Score: 0 Pain Location: central lumbar spine Pain Descriptors / Indicators: Sore    Home Living Family/patient expects to be discharged to:: Skilled nursing facility Living Arrangements: Alone Available Help at Discharge: Family;Available PRN/intermittently;Personal care attendant (paid caregivers in home 4d/week; son [name] there daily to assist) Type of Home: House Home Access: Level entry Entrance Stairs-Rails: None Entrance Stairs-Number of Steps: 1"-inch threshold at front door   Home Layout: Two level;Able to live on main level with bedroom/bathroom Home Equipment: Grab bars - tub/shower;Cane - single Barista (2 wheels);Rollator (4 wheels) Additional Comments: Pt reports home is handicap accessible. Son checks on pt daily, has aide that come 4x/wk soon at 7x/week.    Prior Function Prior Level of Function : Needs assist             Mobility Comments: household distance AMB with RW, minA for ADL suypport, sleeps in a very flat lift chair recliner. ADLs Comments: Pt reports IND with dressing. Son provides transportation and picks up groceries. Requires supervision for showering, aide assists with cooking, has hired cleaners. Pt sleeps in recliner, has urinary urgency (wears briefs at baseline).     Hand Dominance        Extremity/Trunk Assessment   Upper Extremity Assessment Upper Extremity Assessment: Generalized weakness    Lower Extremity Assessment Lower Extremity Assessment: RLE deficits/detail RLE Deficits / Details: Acute pain, decreased strength and AROM consistent with injury and subsequent surgery.       Communication   Communication: HOH  Cognition Arousal/Alertness: Awake/alert Behavior During Therapy: WFL for tasks assessed/performed Overall Cognitive Status: History of cognitive impairments - at baseline                                 General Comments: Mild agitation but significantly improved from earlier evaluation  attempt. Pt questioning whether he has had surgery - mixing up his R and L leg regarding which is painful and which has had surgery.        General Comments      Exercises     Assessment/Plan    PT Assessment Patient needs continued PT services  PT Problem List Decreased strength;Decreased activity tolerance;Decreased mobility;Decreased range of motion;Decreased balance;Decreased cognition;Decreased knowledge of use of DME;Decreased safety awareness;Decreased knowledge of precautions;Pain       PT Treatment Interventions Functional mobility training;Therapeutic activities;Therapeutic exercise;Patient/family education;Gait training;DME instruction;Balance training    PT Goals (Current goals can be found in the Care Plan section)  Acute Rehab PT Goals Patient Stated Goal: Decrease pain - pain control throughout hospital stay for comfort as well as agitation per son PT Goal Formulation: With patient/family Time For Goal Achievement: 05/28/22 Potential to Achieve Goals: Good    Frequency Min 3X/week     Co-evaluation               AM-PAC PT "6 Clicks" Mobility  Outcome Measure Help needed turning from your back to your side while in a flat bed without using bedrails?: Total Help needed moving from lying on  your back to sitting on the side of a flat bed without using bedrails?: Total Help needed moving to and from a bed to a chair (including a wheelchair)?: Total Help needed standing up from a chair using your arms (e.g., wheelchair or bedside chair)?: Total Help needed to walk in hospital room?: Total Help needed climbing 3-5 steps with a railing? : Total 6 Click Score: 6    End of Session Equipment Utilized During Treatment: Oxygen Activity Tolerance: Patient limited by pain;Treatment limited secondary to agitation Patient left: in bed;with call bell/phone within reach;with family/visitor present (OT present) Nurse Communication: Mobility status PT Visit Diagnosis:  Other abnormalities of gait and mobility (R26.89);Difficulty in walking, not elsewhere classified (R26.2)    Time: 1735-6701 PT Time Calculation (min) (ACUTE ONLY): 37 min   Charges:   PT Evaluation $PT Eval High Complexity: 1 High PT Treatments $Therapeutic Activity: 8-22 mins        Rolinda Roan, PT, DPT Acute Rehabilitation Services Secure Chat Preferred Office: [phone]   Thelma Comp 05/14/2022, 4:35 PM

## 2022-05-14 NOTE — Plan of Care (Signed)

## 2022-05-14 NOTE — Evaluation (Signed)
Clinical/Bedside Swallow Evaluation Patient Details  Name: Nathaniel Woods MRN: [id number] Date of Birth: 1929/03/20  Today's Date: 05/14/2022 Time: SLP Start Time (ACUTE ONLY): 59 SLP Stop Time (ACUTE ONLY): 1200 SLP Time Calculation (min) (ACUTE ONLY): 20 min  Past Medical History:  Past Medical History:  Diagnosis Date   AAA (abdominal aortic aneurysm) without rupture ([place])    Acute renal failure superimposed on stage 3a chronic kidney disease ([place])    AICD (automatic cardioverter/defibrillator) present 12/17/2016   Angina    Anxiety    "just before major surgery"   Arthritis    Asthma    childhood   Basal cell carcinoma 10/07/2021   left superior ear, Exc 10/15/2021   BPH associated with nocturia    Chronic systolic CHF (congestive heart failure) ([place])    Coronary artery disease    Hemorrhagic stroke ([place]) 09/07/2008   S/P fall   HOH (hard of hearing)    Hyperlipidemia    Hypertension    Hyponatremia 05/08/2022   Inferior MI ([place]) 10/08/2011   Kidney stone    PAF (paroxysmal atrial fibrillation) (HCC)    Sinus bradycardia    Stroke ([place])    Type II diabetes mellitus ([place])    Improved with weight loss   Wears hearing aid in both ears    Past Surgical History:  Past Surgical History:  Procedure Laterality Date   ABDOMINAL AORTIC ANEURYSM REPAIR  2013   APPENDECTOMY  06/09/1945   CARDIAC CATHETERIZATION  10/08/2011   CATARACT EXTRACTION W/PHACO Right 11/05/2021   Procedure: CATARACT EXTRACTION PHACO AND INTRAOCULAR LENS PLACEMENT ([place]) RIGHT;  Surgeon: Birder Robson, MD;  Location: [facility];  Service: Ophthalmology;  Laterality: Right;  6.83 0:45.5   CATARACT EXTRACTION W/PHACO Left 11/19/2021   Procedure: CATARACT EXTRACTION PHACO AND INTRAOCULAR LENS PLACEMENT ([facility]) LEFT 11.02 01:02.8;  Surgeon: Birder Robson, MD;  Location: [facility];  Service: Ophthalmology;  Laterality: Left;   CORONARY ANGIOPLASTY WITH STENT PLACEMENT  06/10/2007    "2; made total of 6"   CORONARY ARTERY BYPASS GRAFT  10/24/2011   Procedure: CORONARY ARTERY BYPASS GRAFTING (CABG);  Surgeon: Ivin Poot, MD;  Location: [facility];  Service: Open Heart Surgery;  Laterality: N/A;  Coronary Artery Bypass Graft times four using the left internal mammary artery and the right and left greater saphenous veins harvested endoscopically.  transesophageal Echocardiogram   ICD IMPLANT  12/17/2016   INTERTROCHANTERIC HIP FRACTURE SURGERY  05/10/1991   "pin, 2 screws, plate"; right   LITHOTRIPSY  ~ 04/2011   TONSILLECTOMY  06/09/1942   VASECTOMY  ~ 1970   HPI:  Pt is a 88 year old male admitted after fall, sustained right proximal tibial fx, underwent ORIF on 05/13/22. No hx of dysphagia in chart, last seen in 2013 found to be normal.    Assessment / Plan / Recommendation  Clinical Impression  Pt was unfortunately highly distractible, perseverative and unable be redirected. Pt also verbally agitated and suspicious of staff. Despite this SLP able to encourage pt to take a few sips of water. He could not sip from a straw due to dry lips and poor labial seal. He could take cup sips but his termor made it difficult and he would not allow assist. He refused solids. Despite limited intake pt had good breath support, arousal and speech was clear. No history of dysphagia reported. As long as pt is alert and upright and trying to self feed recommend continuing an unrestricted diet to  promote good nutrition and healing. Utilize basic precautions, supervision recommended. Will sign off. If problems are observed please reorder. SLP Visit Diagnosis: Dysphagia, unspecified (R13.10)    Aspiration Risk  Mild aspiration risk;Risk for inadequate nutrition/hydration    Diet Recommendation Regular;Thin liquid   Liquid Administration via: Cup;Straw Medication Administration: Whole meds with liquid Supervision: Staff to assist with self feeding;Full supervision/cueing for compensatory  strategies Compensations: Slow rate;Small sips/bites Postural Changes: Seated upright at 90 degrees    Other  Recommendations      Recommendations for follow up therapy are one component of a multi-disciplinary discharge planning process, led by the attending physician.  Recommendations may be updated based on patient status, additional functional criteria and insurance authorization.  Follow up Recommendations        Assistance Recommended at Discharge    Functional Status Assessment    Frequency and Duration            Prognosis        Swallow Study   General HPI: Pt is a 88 year old male admitted after fall, sustained right proximal tibial fx, underwent ORIF on 05/13/22. No hx of dysphagia in chart, last seen in 2013 found to be normal. Diet Prior to this Study: Regular;Thin liquids Temperature Spikes Noted: No Respiratory Status: Room air History of Recent Intubation: No Behavior/Cognition: Alert;Distractible;Doesn't follow directions;Confused Oral Cavity Assessment: Dry Oral Care Completed by SLP: No Self-Feeding Abilities: Needs assist Patient Positioning: Upright in bed Baseline Vocal Quality: Normal Volitional Cough: Cognitively unable to elicit Volitional Swallow: Unable to elicit    Oral/Motor/Sensory Function Overall Oral Motor/Sensory Function: Within functional limits   Ice Chips     Thin Liquid Thin Liquid: Within functional limits Presentation: Cup;Self Fed    Nectar Thick Nectar Thick Liquid: Not tested   Honey Thick Honey Thick Liquid: Not tested   Puree Puree: Not tested   Solid     Solid: Not tested      Imogine Carvell, Katherene Ponto 05/14/2022,1:48 PM

## 2022-05-14 NOTE — Plan of Care (Signed)

## 2022-05-14 NOTE — Plan of Care (Signed)

## 2022-05-14 NOTE — Progress Notes (Addendum)
PROGRESS NOTE    Nathaniel Woods  UMP:[id number] DOB: 09/07/1928 DOA: 05/13/2022 PCP: Derinda Late, MD   Brief Narrative:    Nathaniel Woods  is a 88 y.o. male, ith medical history significant for coronary artery disease, chronic systolic CHF, CVA, atrial fibrillation on Eliquis, hypertension, dyslipidemia and type 2 diabetes mellitus ,multiple falls, with recent admission at [facility] for fall, where he was discharged 12/2, apparently patient fell again at home today, patient currently is in PACU, very poor historian,  so history was obtained from ED records, and previous medical records, and son by phone ,apparently patient lives with his son, and daughter-in-law, he had a fall where he stated his legs were weak, does not appear to be associated with any loss of consciousness, dizziness or lightheadedness.  Patient has been complaining of right leg pain, where it was noted to be shortened, and rotated, he did have skin tears on the left dorsal hand as well. -In ED workup was significant for right proximal tibial fracture, ED physician at [facility] discussed with Dr. Ginette Pitman, who accepted the patient to be admitted to [facility] for surgical intervention, patient transferred directly to the OR, where he had surgical repair, he is currently in PACU with [facility] hospitalist consulted to admit.  Assessment & Plan:   Principal Problem:   Right tibial fracture Active Problems:   Recurrent falls   Paroxysmal atrial fibrillation (HCC)   Type II diabetes mellitus (HCC)   Dyslipidemia   Coronary artery disease   HTN (hypertension)   S/P CABG x 4   Chronic systolic CHF (congestive heart failure) (HCC)   AICD (automatic cardioverter/defibrillator) present   BPH (benign prostatic hyperplasia)   Fall  Right proximal tibial fracture  Recurrent falls -S/p surgical repair by Dr. Marcelino Scot 05/13/2022. -Discussed with surgical team, okay to start on Eliquis in a.m. so I will resume today.   Continue PT OT.  Management per orthopedics.  Pain management per orthopedics as well.  Acute blood loss anemia: Hemoglobin 7.3.  Will recheck at 4 PM, transfuse if less than 7.  This is likely postop acute blood loss anemia.   Parox A-fib -Patient with known history of paroxysmal A-fib, he is currently with paced rhythm -CHA2DS2-VASc score> 2, continue metoprolol.  Resume Eliquis.   Hypertension: Blood pressure controlled.  Continue Toprol-XL and a.m   Diabetes mellitus, type II -Does not appear to be on any medication, blood sugar elevated, will start on SSI.   Depression -Continue with home medication includes Celexa and Remeron   BPH -Continue with Proscar   Asthma, chronic -no active wheezing currently, continue with Singulair, and as needed albuterol   History of CAD -Continue with Toprol-XL, statin, he is on Eliquis, not on aspirin as he is on Eliquis.   Dyslipidemia -Continue with statin   Depression - We will continue as Celexa and Remeron   BPH (benign prostatic hyperplasia) - We will continue as Proscar   Asthma, chronic - We will continue his Singulair and place him on as needed DuoNebs.   Coronary artery disease - We will continue his Toprol-XL and statin therapy.   Dyslipidemia - We will continue statin therapy.  DVT prophylaxis: Place and maintain sequential compression device Start: 05/13/22 1928   Code Status: Full Code  Family Communication:  None present at bedside.   Status is: Inpatient Remains inpatient appropriate because: Will need PT OT.   Estimated body mass index is 22.87 kg/m as calculated from the following:  Height as of this encounter: '6\' 3"'$  (1.905 m).   Weight as of this encounter: 83 kg.  Pressure Injury 05/09/22 Sacrum Stage 1 -  Intact skin with non-blanchable redness of a localized area usually over a bony prominence. red, non-blanchable (Active)  05/09/22 1200  Location: Sacrum  Location Orientation:   Staging: Stage 1  -  Intact skin with non-blanchable redness of a localized area usually over a bony prominence.  Wound Description (Comments): red, non-blanchable  Present on Admission: Yes  Dressing Type Gauze (Comment) 05/11/22 0900     Pressure Injury Buttocks Right (Active)     Location: Buttocks  Location Orientation: Right  Staging:   Wound Description (Comments):   Present on Admission:   Dressing Type Foam - Lift dressing to assess site every shift 05/11/22 0900     Pressure Injury 05/09/22 Buttocks Left Stage 2 -  Partial thickness loss of dermis presenting as a shallow open injury with a red, pink wound bed without slough. open area to buttocks (Active)  05/09/22 1200  Location: Buttocks  Location Orientation: Left  Staging: Stage 2 -  Partial thickness loss of dermis presenting as a shallow open injury with a red, pink wound bed without slough.  Wound Description (Comments): open area to buttocks  Present on Admission: Yes  Dressing Type Foam - Lift dressing to assess site every shift 05/11/22 0900   Nutritional Assessment: Body mass index is 22.87 kg/m.Marland Kitchen Seen by dietician.  I agree with the assessment and plan as outlined below: Nutrition Status:        . Skin Assessment: I have examined the patient's skin and I agree with the wound assessment as performed by the wound care RN as outlined below: Pressure Injury 05/09/22 Sacrum Stage 1 -  Intact skin with non-blanchable redness of a localized area usually over a bony prominence. red, non-blanchable (Active)  05/09/22 1200  Location: Sacrum  Location Orientation:   Staging: Stage 1 -  Intact skin with non-blanchable redness of a localized area usually over a bony prominence.  Wound Description (Comments): red, non-blanchable  Present on Admission: Yes  Dressing Type Gauze (Comment) 05/11/22 0900     Pressure Injury Buttocks Right (Active)     Location: Buttocks  Location Orientation: Right  Staging:   Wound Description  (Comments):   Present on Admission:   Dressing Type Foam - Lift dressing to assess site every shift 05/11/22 0900     Pressure Injury 05/09/22 Buttocks Left Stage 2 -  Partial thickness loss of dermis presenting as a shallow open injury with a red, pink wound bed without slough. open area to buttocks (Active)  05/09/22 1200  Location: Buttocks  Location Orientation: Left  Staging: Stage 2 -  Partial thickness loss of dermis presenting as a shallow open injury with a red, pink wound bed without slough.  Wound Description (Comments): open area to buttocks  Present on Admission: Yes  Dressing Type Foam - Lift dressing to assess site every shift 05/11/22 0900    Consultants:  Orthopedics  Procedures:  As above  Antimicrobials:  Anti-infectives (From admission, onward)    Start     Dose/Rate Route Frequency Ordered Stop   05/13/22 2300  ceFAZolin (ANCEF) IVPB 2g/100 mL premix        2 g 200 mL/hr over 30 Minutes Intravenous Every 8 hours 05/13/22 1927 05/14/22 2259   05/13/22 1442  ceFAZolin (ANCEF) 2-4 GM/100ML-% IVPB       Note to Pharmacy: Vernell Leep,  Marissa M: cabinet override      05/13/22 1442 05/14/22 0244         Subjective: Patient seen and examined.  He has no complaints.  He is alert and oriented to self only.  Appears comfortable.  Objective: Vitals:   05/13/22 1800 05/13/22 1830 05/13/22 1903 05/14/22 0428  BP: (!) 114/56 (!) 111/52 (!) 104/56 127/66  Pulse: 72 75 85 85  Resp: '14 18 18 15  '$ Temp:   97.7 F (36.5 C) 98 F (36.7 C)  TempSrc:    Oral  SpO2: 97% 93% 95% 100%  Weight:      Height:        Intake/Output Summary (Last 24 hours) at 05/14/2022 0953 Last data filed at 05/14/2022 0158 Gross per 24 hour  Intake 920 ml  Output 20 ml  Net 900 ml   Filed Weights   05/13/22 1433  Weight: 83 kg    Examination:  General exam: Appears calm and comfortable  Respiratory system: Clear to auscultation. Respiratory effort normal. Cardiovascular system:  S1 & S2 heard, RRR. No JVD, murmurs, rubs, gallops or clicks. No pedal edema. Gastrointestinal system: Abdomen is nondistended, soft and nontender. No organomegaly or masses felt. Normal bowel sounds heard. Central nervous system: Alert and oriented x 1. No focal neurological deficits. Skin: No rashes, lesions or ulcers  Data Reviewed: I have personally reviewed following labs and imaging studies  CBC: Recent Labs  Lab 05/08/22 1621 05/09/22 0558 05/12/22 0724 05/14/22 0313  WBC 8.7 7.7 11.1* 8.6  NEUTROABS 6.3  --  9.1*  --   HGB 10.2* 9.4* 9.5* 7.3*  HCT 30.8* 29.6* 29.7* 22.6*  MCV 102.0* 102.1* 101.7* 102.3*  PLT 131* 132* 166 458*   Basic Metabolic Panel: Recent Labs  Lab 05/08/22 1621 05/09/22 0558 05/10/22 0844 05/12/22 0724 05/14/22 0313  NA 132* 136 137 135 137  K 4.1 3.8 4.3 4.5 5.0  CL 107 112* 111 109 106  CO2 22 19* 22 20* 20*  GLUCOSE 124* 89 93 128* 197*  BUN 27* '20 21 19 19  '$ CREATININE 1.32* 1.09 1.20 1.24 1.23  CALCIUM 8.3* 8.3* 8.5* 8.7* 9.1   GFR: Estimated Creatinine Clearance: 45.9 mL/min (by C-G formula based on SCr of 1.23 mg/dL). Liver Function Tests: Recent Labs  Lab 05/08/22 1621  AST 20  ALT 12  ALKPHOS 52  BILITOT 0.8  PROT 6.0*  ALBUMIN 3.4*   Recent Labs  Lab 05/08/22 1621  LIPASE 59*   No results for input(s): "AMMONIA" in the last 168 hours. Coagulation Profile: Recent Labs  Lab 05/12/22 0724  INR 1.9*   Cardiac Enzymes: No results for input(s): "CKTOTAL", "CKMB", "CKMBINDEX", "TROPONINI" in the last 168 hours. BNP (last 3 results) No results for input(s): "PROBNP" in the last 8760 hours. HbA1C: No results for input(s): "HGBA1C" in the last 72 hours. CBG: Recent Labs  Lab 05/13/22 0942 05/13/22 1416 05/13/22 1723 05/13/22 2035 05/14/22 0832  GLUCAP 133* 139* 147* 152* 171*   Lipid Profile: No results for input(s): "CHOL", "HDL", "LDLCALC", "TRIG", "CHOLHDL", "LDLDIRECT" in the last 72 hours. Thyroid  Function Tests: No results for input(s): "TSH", "T4TOTAL", "FREET4", "T3FREE", "THYROIDAB" in the last 72 hours. Anemia Panel: No results for input(s): "VITAMINB12", "FOLATE", "FERRITIN", "TIBC", "IRON", "RETICCTPCT" in the last 72 hours. Sepsis Labs: No results for input(s): "PROCALCITON", "LATICACIDVEN" in the last 168 hours.  No results found for this or any previous visit (from the past 240 hour(s)).   Radiology Studies:  DG Knee Right Port  Result Date: 05/13/2022 CLINICAL DATA:  Fracture tibia and fibula EXAM: PORTABLE RIGHT KNEE - 1-2 VIEW COMPARISON:  05/12/2022 FINDINGS: There is interval reduction and internal fixation of comminuted displaced fracture of proximal shaft of tibia with metallic plate and multiple surgical screws. There is improvement in alignment. There is essentially undisplaced fracture in the neck of right fibula. Arterial calcifications are seen in soft tissues. IMPRESSION: Interval reduction and internal fixation of severely comminuted fracture of proximal shaft of right tibia. Electronically Signed   By: Elmer Picker M.D.   On: 05/13/2022 20:47   DG Shoulder Left  Result Date: 05/13/2022 CLINICAL DATA:  Trauma, pain EXAM: LEFT SHOULDER - 2+ VIEW COMPARISON:  None Available. FINDINGS: No displaced fracture or dislocation is seen. Osteopenia is seen in bony structures. Degenerative changes are noted in left AC joint. Pacemaker/defibrillator battery is seen in left infraclavicular region. IMPRESSION: No recent fracture or dislocation is seen in left shoulder. Electronically Signed   By: Elmer Picker M.D.   On: 05/13/2022 20:41   DG Tibia/Fibula Right  Result Date: 05/13/2022 CLINICAL DATA:  Open reduction internal fixation right tibia fracture. Intraoperative fluoroscopy. EXAM: RIGHT TIBIA AND FIBULA - 2 VIEW COMPARISON:  Right knee radiographs 05/12/2022 FINDINGS: Images were performed intraoperatively without the presence of a radiologist. The patient is  undergoing lateral plate and screw fixation of the previously seen angulated and mildly displaced proximal tibial metaphyseal and diaphyseal fracture. There is improved alignment. Total fluoroscopy images: 7 Total fluoroscopy time: 30 seconds Total dose: Radiation Exposure Index (as provided by the fluoroscopic device): 2.75 mGy air Kerma Please see intraoperative findings for further detail. IMPRESSION: Intraoperative fluoroscopy provided for lateral plate and screw fixation of the proximal tibial fracture. Electronically Signed   By: Yvonne Kendall M.D.   On: 05/13/2022 17:49   DG C-Arm 1-60 Min-No Report  Result Date: 05/13/2022 Fluoroscopy was utilized by the requesting physician.  No radiographic interpretation.   CT KNEE RIGHT WO CONTRAST  Result Date: 05/13/2022 CLINICAL DATA:  Proximal tibial fracture. EXAM: CT OF THE RIGHT KNEE WITHOUT CONTRAST TECHNIQUE: Multidetector CT imaging of the right knee was performed according to the standard protocol. Multiplanar CT image reconstructions were also generated. RADIATION DOSE REDUCTION: This exam was performed according to the departmental dose-optimization program which includes automated exposure control, adjustment of the mA and/or kV according to patient size and/or use of iterative reconstruction technique. COMPARISON:  Radiograph performed May 12, 2022 FINDINGS: Bones/Joint/Cartilage There is a mildly displaced comminuted fracture of the proximal tibial metadiaphysis. There is a proximally 1.2 cm anterior displacement and medial angulation of the distal fracture fragment. There is intra-articular extension of the fracture with fracture line extends into the lateral tibial plateau. There is also mildly displaced fracture of the fibular head and neck. There is no appreciable joint effusion. Patella and distal femur appear intact. Ligaments Suboptimally assessed by CT. Muscles and Tendons Generalized muscle atrophy. No intramuscular hematoma or fluid  collection. Soft tissues Subcutaneous soft tissue edema about anterior aspect of the tibia. No fluid collection or hematoma. IMPRESSION: 1. Mildly displaced comminuted fracture of the proximal tibial metadiaphysis with intra-articular extension. 2. Mildly displaced fracture of the fibular head and neck. Electronically Signed   By: Keane Police D.O.   On: 05/13/2022 12:16    Scheduled Meds:  acetaminophen  650 mg Oral Q6H   amitriptyline  50 mg Oral QHS   apixaban  5 mg Oral BID   calcium-vitamin D  1 tablet Oral QPM   citalopram  20 mg Oral QPM   docusate sodium  100 mg Oral BID   finasteride  5 mg Oral QPM   metoprolol succinate  25 mg Oral Daily   montelukast  10 mg Oral QHS   rosuvastatin  5 mg Oral Q M,W,F   Continuous Infusions:   ceFAZolin (ANCEF) IV 2 g (05/14/22 0547)   methocarbamol (ROBAXIN) IV       LOS: 1 day   Darliss Cheney, MD [facility]  05/14/2022, 9:53 AM   *Please note that this is a verbal dictation therefore any spelling or grammatical errors are due to the "[place] [employer]" system interpretation.  Please page via [place] and do not message via secure chat for urgent patient care matters. Secure chat can be used for non urgent patient care matters.  How to contact the [facility] Attending or Consulting provider [place] or covering provider during after hours [place], for this patient?  Check the care team in [facility] and look for a) attending/consulting [facility] provider listed and b) the [facility] team listed. Page or secure chat [phone]. Log into [email or web address] and use [place]'s universal password to access. If you do not have the password, please contact the hospital operator. Locate the [facility] provider you are looking for under [facility] and page to a number that you can be directly reached. If you still have difficulty reaching the provider, please page the [facility] (Director on Call) for the Hospitalists listed on amion for assistance.

## 2022-05-14 NOTE — Anesthesia Postprocedure Evaluation (Signed)
Anesthesia Post Note  Patient: Nathaniel Woods  Procedure(s) Performed: OPEN REDUCTION INTERNAL FIXATION (ORIF) TIBIA FRACTURE (Right: Leg Lower)     Patient location during evaluation: PACU Anesthesia Type: General Level of consciousness: awake and alert Pain management: pain level controlled Vital Signs Assessment: post-procedure vital signs reviewed and stable Respiratory status: spontaneous breathing, nonlabored ventilation, respiratory function stable and patient connected to nasal cannula oxygen Cardiovascular status: blood pressure returned to baseline and stable Postop Assessment: no apparent nausea or vomiting Anesthetic complications: no   No notable events documented.  Last Vitals:  Vitals:   05/14/22 0428 05/14/22 1002  BP: 127/66 (!) 137/57  Pulse: 85 61  Resp: 15   Temp: 36.7 C   SpO2: 100%     Last Pain:  Vitals:   05/14/22 0428  TempSrc: Oral                 Audry Pili

## 2022-05-14 NOTE — Progress Notes (Signed)
Orthopaedic Trauma Service Progress Note  Patient ID: Nathaniel Woods MRN: [id number] DOB/AGE: May 13, 1929 88 y.o.  Subjective:  Doing ok  No cp or sob  No specific complaints    ROS As above  Objective:   VITALS:   Vitals:   05/13/22 1800 05/13/22 1830 05/13/22 1903 05/14/22 0428  BP: (!) 114/56 (!) 111/52 (!) 104/56 127/66  Pulse: 72 75 85 85  Resp: '14 18 18 15  '$ Temp:   97.7 F (36.5 C) 98 F (36.7 C)  TempSrc:    Oral  SpO2: 97% 93% 95% 100%  Weight:      Height:        Estimated body mass index is 22.87 kg/m as calculated from the following:   Height as of this encounter: '6\' 3"'$  (1.905 m).   Weight as of this encounter: 83 kg.   Intake/Output      12/05 0701 12/06 0700 12/06 0701 12/07 0700   P.O. 120    I.V. (mL/kg) 800 (9.6)    Total Intake(mL/kg) 920 (11.1)    Blood 20    Total Output 20    Net +900         Urine Occurrence 1 x      LABS  Results for orders placed or performed during the hospital encounter of 05/13/22 (from the past 24 hour(s))  Glucose, capillary     Status: Abnormal   Collection Time: 05/13/22  2:16 PM  Result Value Ref Range   Glucose-Capillary 139 (H) 70 - 99 mg/dL  Glucose, capillary     Status: Abnormal   Collection Time: 05/13/22  5:23 PM  Result Value Ref Range   Glucose-Capillary 147 (H) 70 - 99 mg/dL  Glucose, capillary     Status: Abnormal   Collection Time: 05/13/22  8:35 PM  Result Value Ref Range   Glucose-Capillary 152 (H) 70 - 99 mg/dL  CBC     Status: Abnormal   Collection Time: 05/14/22  3:13 AM  Result Value Ref Range   WBC 8.6 4.0 - 10.5 K/uL   RBC 2.21 (L) 4.22 - 5.81 MIL/uL   Hemoglobin 7.3 (L) 13.0 - 17.0 g/dL   HCT 22.6 (L) 39.0 - 52.0 %   MCV 102.3 (H) 80.0 - 100.0 fL   MCH 33.0 26.0 - 34.0 pg   MCHC 32.3 30.0 - 36.0 g/dL   RDW 14.3 11.5 - 15.5 %   Platelets 142 (L) 150 - 400 K/uL   nRBC 0.0 0.0 - 0.2 %  Basic  metabolic panel     Status: Abnormal   Collection Time: 05/14/22  3:13 AM  Result Value Ref Range   Sodium 137 135 - 145 mmol/L   Potassium 5.0 3.5 - 5.1 mmol/L   Chloride 106 98 - 111 mmol/L   CO2 20 (L) 22 - 32 mmol/L   Glucose, Bld 197 (H) 70 - 99 mg/dL   BUN 19 8 - 23 mg/dL   Creatinine, Ser 1.23 0.61 - 1.24 mg/dL   Calcium 9.1 8.9 - 10.3 mg/dL   GFR, Estimated 55 (L) >60 mL/min   Anion gap 11 5 - 15  VITAMIN D 25 Hydroxy (Vit-D Deficiency, Fractures)     Status: None   Collection Time: 05/14/22  3:13 AM  Result Value Ref Range  Vit D, 25-Hydroxy 39.73 30 - 100 ng/mL  Glucose, capillary     Status: Abnormal   Collection Time: 05/14/22  8:32 AM  Result Value Ref Range   Glucose-Capillary 171 (H) 70 - 99 mg/dL     PHYSICAL EXAM:   Gen: awake, alert, NAD, sitting up in bed  Lungs: unlabored Ext:       Right Lower Extremity   Dressing R leg clean, dry and intact  Moderate swelling R leg  Ext warm   Moves toes w/o difficulty   DPN, SPN, TN sensation grossly intact  EHL, FHL, lesser toe motor intact  Ankle flexion, extension, inversion and eversion intact  No DCT   Compartments are soft   Assessment/Plan: 1 Day Post-Op    Anti-infectives (From admission, onward)    Start     Dose/Rate Route Frequency Ordered Stop   05/13/22 2300  ceFAZolin (ANCEF) IVPB 2g/100 mL premix        2 g 200 mL/hr over 30 Minutes Intravenous Every 8 hours 05/13/22 1927 05/14/22 2259   05/13/22 1442  ceFAZolin (ANCEF) 2-4 GM/100ML-% IVPB       Note to Pharmacy: Cameron Sprang M: cabinet override      05/13/22 1442 05/14/22 0244     .  POD/HD#: 1  88 y/o male s/p fall with R bicondylar tibial plateau fracture  -fall  - R bicondylar tibial plateau fracture (fragility fracture) s/p ORIF  NWB R leg x 6 weeks  Unrestricted ROM R knee  Elevate heels off bed  Ice prn   PT/OT  Dressing changes as needed starting tomorrow   PT- please teach HEP for R knee ROM- AROM, PROM. No ROM  restrictions.  Quad sets, SLR, LAQ, SAQ, heel slides, stretching  Ankle theraband program, heel cord stretching, toe towel curls, etc  No pillows under bend of knee when at rest, ok to place under heel to help work on extension. Can also use zero knee bone foam if available   - Pain management:  Multimodal  Minimize narcotics  - ABL anemia/Hemodynamics  H/h this afternoon  Suspect he will need blood   - Medical issues   Per primary   - DVT/PE prophylaxis:  Eliquis on hold due to decreasing h/h - ID:   Periop abx  - Metabolic Bone Disease:  Vitamin d levels ok but fracture is a fragility fracture  Optimize nutrition    - Activity:  As above  - Impediments to fracture healing:  Age  Poor bone quality    - Dispo:  Therapy evals  Likely SNF      Jari Pigg, PA-C [phone] (C) 05/14/2022, 9:47 AM  [facility] [place] [phone] Jenetta [phone] (F)    After 5pm and on the weekends please log on to Amion, go to orthopaedics and the look under the [facility] Call for the provider(s) on call. You can also call our office at [phone] and then follow the prompts to be connected to the call team.  Patient ID: Nathaniel Woods, male   DOB: 1929/02/06, 88 y.o.   MRN: [id number]

## 2022-05-14 NOTE — Evaluation (Signed)
Occupational Therapy Evaluation Patient Details Name: Nathaniel Woods MRN: [id number] DOB: [birth year] Today's Date: 05/14/2022   History of Present Illness Pt is a 88 y/o male who presents from [facility] for ORIF of R tibial plateau fracture on 05/13/2022. He will be NWB on the RLE for 6 weeks. PMH significant for multiple recent admissions 2 falls, AAA, CKD III, AICD, CHF, CAD, HOH, HTN, MI, PAF, bradycardia, CVA, DM II, R hip fracture fixation 1992.   Clinical Impression   Evaluation limited as pt had just finished working with PT, however pt cooperative and less agitated than earlier today. Son present and states that his Dad had experienced a gradual decline in mobility and ability to complete ADL tasks over the past several months. At baseline, pt is able to ambulate with a RW and assist with ADL tasks. He has caregivers 4 days/wk, which son is increasing to 7 days a week. Currently requires Max A +2 for bed mobility and Max to total A with LB ADL tasks due to deficits listed below. Recommend rehab at SNF to maximize functional level of independence with goal of return ing home with 24/7 care. Acute OT to follow.      Recommendations for follow up therapy are one component of a multi-disciplinary discharge planning process, led by the attending physician.  Recommendations may be updated based on patient status, additional functional criteria and insurance authorization.   Follow Up Recommendations  Skilled nursing-short term rehab (<3 hours/day)     Assistance Recommended at Discharge Frequent or constant Supervision/Assistance  Patient can return home with the following Two people to help with walking and/or transfers;Two people to help with bathing/dressing/bathroom;Assistance with cooking/housework;Assistance with feeding;Direct supervision/assist for medications management;Direct supervision/assist for financial management;Assist for transportation;Help with stairs or ramp for entrance     Functional Status Assessment  Patient has had a recent decline in their functional status and demonstrates the ability to make significant improvements in function in a reasonable and predictable amount of time.  Equipment Recommendations  None recommended by OT    Recommendations for Other Services       Precautions / Restrictions Precautions Precautions: Fall;Back Precaution Booklet Issued: No Restrictions Weight Bearing Restrictions: Yes RLE Weight Bearing: Non weight bearing      Mobility Bed Mobility               General bed mobility comments: had just returned to bed with PT    Transfers                   General transfer comment: Deferred for safety due to weight bearing restrictions and decreased cognition to maintain. No recall of WB restrictions      Balance Overall balance assessment: History of Falls, Needs assistance Sitting-balance support: No upper extremity supported, Feet supported Sitting balance-Leahy Scale: Poor Sitting balance - Comments: Posterior lean initially requiring assistance to maintain upright posture. Improved to fair by end of session.                                   ADL either performed or assessed with clinical judgement   ADL Overall ADL's : Needs assistance/impaired Eating/Feeding: Set up;Supervision/ safety Eating/Feeding Details (indicate cue type and reason): Educated son on importance of eating in upright position Grooming: Minimal assistance   Upper Body Bathing: Moderate assistance;Bed level   Lower Body Bathing: Maximal assistance;Bed level   Upper Body Dressing :  Moderate assistance;Bed level   Lower Body Dressing: Total assistance;Bed level         Toileting - Clothing Manipulation Details (indicate cue type and reason): total     Functional mobility during ADLs:  (bed level)       Vision         Perception     Praxis      Pertinent Vitals/Pain Pain Assessment Pain  Assessment: Faces Faces Pain Scale: Hurts whole lot Pain Location: RLE Pain Descriptors / Indicators: Sore, Grimacing Pain Intervention(s): Limited activity within patient's tolerance     Hand Dominance Right   Extremity/Trunk Assessment Upper Extremity Assessment Upper Extremity Assessment: Generalized weakness   Lower Extremity Assessment Lower Extremity Assessment: Defer to PT evaluation RLE Deficits / Details: Acute pain, decreased strength and AROM consistent with injury and subsequent surgery.       Communication Communication Communication: HOH   Cognition Arousal/Alertness: Awake/alert Behavior During Therapy: Flat affect, Impulsive, Restless Overall Cognitive Status: Impaired/Different from baseline Area of Impairment: Orientation, Attention, Memory, Following commands, Safety/judgement, Awareness, Problem solving                   Current Attention Level: Sustained Memory: Decreased short-term memory Following Commands: Follows one step commands with increased time Safety/Judgement: Decreased awareness of safety, Decreased awareness of deficits Awareness: Intellectual Problem Solving: Slow processing       General Comments       Exercises     Shoulder Instructions      Home Living Family/patient expects to be discharged to:: Skilled nursing facility Living Arrangements: Alone Available Help at Discharge: Family;Available PRN/intermittently;Personal care attendant (paid caregivers in home 4d/week; son [name] there daily to assist) Type of Home: House Home Access: Level entry Entrance Stairs-Number of Steps: 1"-inch threshold at front door Entrance Stairs-Rails: None Home Layout: Two level;Able to live on main level with bedroom/bathroom     Bathroom Shower/Tub: Walk-in shower;Door (6" step into shower, keeps a walker in the shower too)   Bathroom Toilet: Handicapped height (22" toilet height)     Home Equipment: Grab bars - tub/shower;Cane -  single Barista (2 wheels);Rollator (4 wheels)   Additional Comments: Pt reports home is handicap accessible. Son checks on pt daily, has aide that come 4x/wk soon at 7x/week.      Prior Functioning/Environment Prior Level of Function : Needs assist             Mobility Comments: household distance AMB with RW, minA for ADL suypport, sleeps in a very flat lift chair recliner. ADLs Comments: pt reports he is indpendentwith ADL tasks however son states he has had a progressive decline over the 3 months; involved with HHOT/PT        OT Problem List: Decreased strength;Decreased range of motion;Decreased activity tolerance;Impaired balance (sitting and/or standing);Decreased cognition;Decreased safety awareness;Decreased knowledge of precautions;Pain;Decreased knowledge of use of DME or AE      OT Treatment/Interventions: Self-care/ADL training;Therapeutic exercise;DME and/or AE instruction;Therapeutic activities;Patient/family education;Balance training    OT Goals(Current goals can be found in the care plan section) Acute Rehab OT Goals Patient Stated Goal: to evetually return home after rehab OT Goal Formulation: With patient/family Time For Goal Achievement: 05/28/22 Potential to Achieve Goals: Good  OT Frequency: Min 2X/week    Co-evaluation              AM-PAC OT "6 Clicks" Daily Activity     Outcome Measure Help from another person eating meals?: A Little Help from  another person taking care of personal grooming?: A Lot Help from another person toileting, which includes using toliet, bedpan, or urinal?: Total Help from another person bathing (including washing, rinsing, drying)?: A Lot Help from another person to put on and taking off regular upper body clothing?: A Lot Help from another person to put on and taking off regular lower body clothing?: A Lot 6 Click Score: 12   End of Session Nurse Communication: Mobility status;Precautions;Weight bearing  status  Activity Tolerance: Other (comment);Patient limited by fatigue Patient left: in bed;with call bell/phone within reach;with bed alarm set;with family/visitor present  OT Visit Diagnosis: Unsteadiness on feet (R26.81);Other abnormalities of gait and mobility (R26.89);Repeated falls (R29.6);Muscle weakness (generalized) (M62.81);Other symptoms and signs involving cognitive function;Pain Pain - Right/Left: Right Pain - part of body: Leg                Time: 1540-1600 OT Time Calculation (min): 20 min Charges:  OT General Charges $OT Visit: 1 Visit OT Evaluation $OT Eval Moderate Complexity: [place], OT/L   Acute OT Clinical Specialist [place] Pager [phone] Office [phone]   [facility] 05/14/2022, 5:28 PM

## 2022-05-15 DIAGNOSIS — S82191S Other fracture of upper end of right tibia, sequela: Secondary | ICD-10-CM | POA: Diagnosis not present

## 2022-05-15 LAB — CBC WITH DIFFERENTIAL/PLATELET
Abs Immature Granulocytes: 0.13 10*3/uL — ABNORMAL HIGH (ref 0.00–0.07)
Basophils Absolute: 0 10*3/uL (ref 0.0–0.1)
Basophils Relative: 0 %
Eosinophils Absolute: 0 10*3/uL (ref 0.0–0.5)
Eosinophils Relative: 0 %
HCT: 21.7 % — ABNORMAL LOW (ref 39.0–52.0)
Hemoglobin: 7.1 g/dL — ABNORMAL LOW (ref 13.0–17.0)
Immature Granulocytes: 1 %
Lymphocytes Relative: 5 %
Lymphs Abs: 0.7 10*3/uL (ref 0.7–4.0)
MCH: 32.9 pg (ref 26.0–34.0)
MCHC: 32.7 g/dL (ref 30.0–36.0)
MCV: 100.5 fL — ABNORMAL HIGH (ref 80.0–100.0)
Monocytes Absolute: 1.2 10*3/uL — ABNORMAL HIGH (ref 0.1–1.0)
Monocytes Relative: 9 %
Neutro Abs: 11 10*3/uL — ABNORMAL HIGH (ref 1.7–7.7)
Neutrophils Relative %: 85 %
Platelets: 154 10*3/uL (ref 150–400)
RBC: 2.16 MIL/uL — ABNORMAL LOW (ref 4.22–5.81)
RDW: 14.4 % (ref 11.5–15.5)
WBC: 13.1 10*3/uL — ABNORMAL HIGH (ref 4.0–10.5)
nRBC: 0.4 % — ABNORMAL HIGH (ref 0.0–0.2)

## 2022-05-15 LAB — CBC
HCT: 23.2 % — ABNORMAL LOW (ref 39.0–52.0)
Hemoglobin: 7.8 g/dL — ABNORMAL LOW (ref 13.0–17.0)
MCH: 33.9 pg (ref 26.0–34.0)
MCHC: 33.6 g/dL (ref 30.0–36.0)
MCV: 100.9 fL — ABNORMAL HIGH (ref 80.0–100.0)
Platelets: 183 10*3/uL (ref 150–400)
RBC: 2.3 MIL/uL — ABNORMAL LOW (ref 4.22–5.81)
RDW: 14.6 % (ref 11.5–15.5)
WBC: 14.1 10*3/uL — ABNORMAL HIGH (ref 4.0–10.5)
nRBC: 0.5 % — ABNORMAL HIGH (ref 0.0–0.2)

## 2022-05-15 LAB — BASIC METABOLIC PANEL
Anion gap: 7 (ref 5–15)
BUN: 24 mg/dL — ABNORMAL HIGH (ref 8–23)
CO2: 21 mmol/L — ABNORMAL LOW (ref 22–32)
Calcium: 8.7 mg/dL — ABNORMAL LOW (ref 8.9–10.3)
Chloride: 106 mmol/L (ref 98–111)
Creatinine, Ser: 1.45 mg/dL — ABNORMAL HIGH (ref 0.61–1.24)
GFR, Estimated: 45 mL/min — ABNORMAL LOW (ref >60)
Glucose, Bld: 129 mg/dL — ABNORMAL HIGH (ref 70–99)
Potassium: 5 mmol/L (ref 3.5–5.1)
Sodium: 134 mmol/L — ABNORMAL LOW (ref 135–145)

## 2022-05-15 LAB — GLUCOSE, CAPILLARY
Glucose-Capillary: 109 mg/dL — ABNORMAL HIGH (ref 70–99)
Glucose-Capillary: 116 mg/dL — ABNORMAL HIGH (ref 70–99)
Glucose-Capillary: 69 mg/dL — ABNORMAL LOW (ref 70–99)
Glucose-Capillary: 95 mg/dL (ref 70–99)
Glucose-Capillary: 102 mg/dL — ABNORMAL HIGH (ref 70–99)

## 2022-05-15 MED ORDER — APIXABAN 5 MG PO TABS
5.0000 mg | ORAL_TABLET | Freq: Two times a day (BID) | ORAL | Status: DC
  Administered 2022-05-15 – 2022-05-20 (×11): 5 mg via ORAL
  Filled 2022-05-15 (×11): qty 1

## 2022-05-15 MED ORDER — LACTATED RINGERS IV SOLN: INTRAVENOUS | Status: DC

## 2022-05-15 NOTE — Progress Notes (Signed)
PROGRESS NOTE    Nathaniel Woods  IOE:[id number] DOB: Jan 29, 1929 DOA: 05/13/2022 PCP: Derinda Late, MD   Brief Narrative:    Nathaniel Woods  is a 88 y.o. male, ith medical history significant for coronary artery disease, chronic systolic CHF, CVA, atrial fibrillation on Eliquis, hypertension, dyslipidemia and type 2 diabetes mellitus ,multiple falls, with recent admission at [facility] for fall, where he was discharged 12/2, apparently patient fell again at home today, patient currently is in PACU, very poor historian,  so history was obtained from ED records, and previous medical records, and son by phone ,apparently patient lives with his son, and daughter-in-law, he had a fall where he stated his legs were weak, does not appear to be associated with any loss of consciousness, dizziness or lightheadedness.  Patient has been complaining of right leg pain, where it was noted to be shortened, and rotated, he did have skin tears on the left dorsal hand as well. -In ED workup was significant for right proximal tibial fracture, ED physician at [facility] discussed with Dr. Ginette Pitman, who accepted the patient to be admitted to [facility] for surgical intervention, patient transferred directly to the OR, where he had surgical repair, he is currently in PACU with [facility] hospitalist consulted to admit.  Assessment & Plan:   Principal Problem:   Right tibial fracture Active Problems:   Recurrent falls   Paroxysmal atrial fibrillation (HCC)   Type II diabetes mellitus (HCC)   Dyslipidemia   Coronary artery disease   HTN (hypertension)   S/P CABG x 4   Chronic systolic CHF (congestive heart failure) (HCC)   AICD (automatic cardioverter/defibrillator) present   BPH (benign prostatic hyperplasia)   Fall  Right proximal tibial fracture  Recurrent falls -S/p surgical repair by Dr. Marcelino Scot 05/13/2022.  Eliquis resumed on 05/14/2022 after clearance from orthopedics.  Seen by PT OT, recommendations  for SNF.  Management per orthopedics.  TOC consulted.  Acute blood loss anemia: Hemoglobin 7.1.  Will recheck at 4 PM, transfuse if less than 7.  This is likely postop acute blood loss anemia.   Parox A-fib -Patient with known history of paroxysmal A-fib, he is currently with paced rhythm -CHA2DS2-VASc score> 2, continue metoprolol.  Resume Eliquis.   Hypertension: Blood pressure controlled.  Continue Toprol-XL and a.m   Diabetes mellitus, type II -Does not appear to be on any medication, blood sugar controlled, continue SSI.   Depression -Continue with home medication includes Celexa and Remeron   BPH -Continue with Proscar   Asthma, chronic -no active wheezing currently, continue with Singulair, and as needed albuterol   History of CAD -Continue with Toprol-XL, statin, he is on Eliquis, not on aspirin as he is on Eliquis.   Dyslipidemia -Continue with statin   Depression - We will continue as Celexa and Remeron   BPH (benign prostatic hyperplasia) - We will continue as Proscar   Asthma, chronic - We will continue his Singulair and place him on as needed DuoNebs.   Coronary artery disease - We will continue his Toprol-XL and statin therapy.   Dyslipidemia - We will continue statin therapy.  DVT prophylaxis: Place and maintain sequential compression device Start: 05/13/22 1928   Code Status: Full Code  Family Communication:  None present at bedside.   Status is: Inpatient Remains inpatient appropriate because: Will need PT OT.   Estimated body mass index is 22.87 kg/m as calculated from the following:   Height as of this encounter: '6\' 3"'$  (1.905  m).   Weight as of this encounter: 83 kg.  Pressure Injury 05/09/22 Sacrum Stage 1 -  Intact skin with non-blanchable redness of a localized area usually over a bony prominence. red, non-blanchable (Active)  05/09/22 1200  Location: Sacrum  Location Orientation:   Staging: Stage 1 -  Intact skin with non-blanchable  redness of a localized area usually over a bony prominence.  Wound Description (Comments): red, non-blanchable  Present on Admission: Yes  Dressing Type Foam - Lift dressing to assess site every shift 05/15/22 0830     Pressure Injury Buttocks Right (Active)     Location: Buttocks  Location Orientation: Right  Staging:   Wound Description (Comments):   Present on Admission:   Dressing Type Foam - Lift dressing to assess site every shift 05/11/22 0900     Pressure Injury 05/09/22 Buttocks Left Stage 2 -  Partial thickness loss of dermis presenting as a shallow open injury with a red, pink wound bed without slough. open area to buttocks (Active)  05/09/22 1200  Location: Buttocks  Location Orientation: Left  Staging: Stage 2 -  Partial thickness loss of dermis presenting as a shallow open injury with a red, pink wound bed without slough.  Wound Description (Comments): open area to buttocks  Present on Admission: Yes  Dressing Type Foam - Lift dressing to assess site every shift 05/15/22 0830   Nutritional Assessment: Body mass index is 22.87 kg/m.Marland Kitchen Seen by dietician.  I agree with the assessment and plan as outlined below: Nutrition Status: Nutrition Problem: Increased nutrient needs Etiology: post-op healing Signs/Symptoms: estimated needs Interventions: Ensure Enlive (each supplement provides 350kcal and 20 grams of protein), Liberalize Diet  . Skin Assessment: I have examined the patient's skin and I agree with the wound assessment as performed by the wound care RN as outlined below: Pressure Injury 05/09/22 Sacrum Stage 1 -  Intact skin with non-blanchable redness of a localized area usually over a bony prominence. red, non-blanchable (Active)  05/09/22 1200  Location: Sacrum  Location Orientation:   Staging: Stage 1 -  Intact skin with non-blanchable redness of a localized area usually over a bony prominence.  Wound Description (Comments): red, non-blanchable  Present on  Admission: Yes  Dressing Type Foam - Lift dressing to assess site every shift 05/15/22 0830     Pressure Injury Buttocks Right (Active)     Location: Buttocks  Location Orientation: Right  Staging:   Wound Description (Comments):   Present on Admission:   Dressing Type Foam - Lift dressing to assess site every shift 05/11/22 0900     Pressure Injury 05/09/22 Buttocks Left Stage 2 -  Partial thickness loss of dermis presenting as a shallow open injury with a red, pink wound bed without slough. open area to buttocks (Active)  05/09/22 1200  Location: Buttocks  Location Orientation: Left  Staging: Stage 2 -  Partial thickness loss of dermis presenting as a shallow open injury with a red, pink wound bed without slough.  Wound Description (Comments): open area to buttocks  Present on Admission: Yes  Dressing Type Foam - Lift dressing to assess site every shift 05/15/22 0830    Consultants:  Orthopedics  Procedures:  As above  Antimicrobials:  Anti-infectives (From admission, onward)    Start     Dose/Rate Route Frequency Ordered Stop   05/13/22 2300  ceFAZolin (ANCEF) IVPB 2g/100 mL premix        2 g 200 mL/hr over 30 Minutes Intravenous Every 8  hours 05/13/22 1927 05/14/22 1701   05/13/22 1442  ceFAZolin (ANCEF) 2-4 GM/100ML-% IVPB       Note to Pharmacy: Cameron Sprang M: cabinet override      05/13/22 1442 05/14/22 0244         Subjective:  Patient seen and examined.  He has no complaints.  He is alert and oriented to self at baseline due to his dementia.  Objective: Vitals:   05/14/22 1224 05/14/22 2020 05/15/22 0406 05/15/22 0854  BP: 121/69 129/68 123/67 114/65  Pulse: 98 60 68 83  Resp: 16 (!) '25 14 20  '$ Temp: 98.3 F (36.8 C)  98.1 F (36.7 C) (!) 97.5 F (36.4 C)  TempSrc: Oral  Oral Oral  SpO2: 100% 100% 100% 100%  Weight:      Height:        Intake/Output Summary (Last 24 hours) at 05/15/2022 1142 Last data filed at 05/15/2022 0608 Gross per 24  hour  Intake 540 ml  Output 150 ml  Net 390 ml    Filed Weights   05/13/22 1433  Weight: 83 kg    Examination:  General exam: Appears calm and comfortable  Respiratory system: Clear to auscultation. Respiratory effort normal. Cardiovascular system: S1 & S2 heard, RRR. No JVD, murmurs, rubs, gallops or clicks. No pedal edema. Gastrointestinal system: Abdomen is nondistended, soft and nontender. No organomegaly or masses felt. Normal bowel sounds heard. Central nervous system: Alert and oriented x 1. No focal neurological deficits. Extremities: Symmetric 5 x 5 power. Skin: No rashes, lesions or ulcers.    Data Reviewed: I have personally reviewed following labs and imaging studies  CBC: Recent Labs  Lab 05/08/22 1621 05/09/22 0558 05/12/22 0724 05/14/22 0313 05/14/22 1625 05/15/22 0343  WBC 8.7 7.7 11.1* 8.6  --  13.1*  NEUTROABS 6.3  --  9.1*  --   --  11.0*  HGB 10.2* 9.4* 9.5* 7.3* 7.5* 7.1*  HCT 30.8* 29.6* 29.7* 22.6* 22.4* 21.7*  MCV 102.0* 102.1* 101.7* 102.3*  --  100.5*  PLT 131* 132* 166 142*  --  301    Basic Metabolic Panel: Recent Labs  Lab 05/09/22 0558 05/10/22 0844 05/12/22 0724 05/14/22 0313 05/15/22 0343  NA 136 137 135 137 134*  K 3.8 4.3 4.5 5.0 5.0  CL 112* 111 109 106 106  CO2 19* 22 20* 20* 21*  GLUCOSE 89 93 128* 197* 129*  BUN '20 21 19 19 '$ 24*  CREATININE 1.09 1.20 1.24 1.23 1.45*  CALCIUM 8.3* 8.5* 8.7* 9.1 8.7*    GFR: Estimated Creatinine Clearance: 39 mL/min (A) (by C-G formula based on SCr of 1.45 mg/dL (H)). Liver Function Tests: Recent Labs  Lab 05/08/22 1621  AST 20  ALT 12  ALKPHOS 52  BILITOT 0.8  PROT 6.0*  ALBUMIN 3.4*    Recent Labs  Lab 05/08/22 1621  LIPASE 59*    No results for input(s): "AMMONIA" in the last 168 hours. Coagulation Profile: Recent Labs  Lab 05/12/22 0724  INR 1.9*    Cardiac Enzymes: No results for input(s): "CKTOTAL", "CKMB", "CKMBINDEX", "TROPONINI" in the last 168  hours. BNP (last 3 results) No results for input(s): "PROBNP" in the last 8760 hours. HbA1C: No results for input(s): "HGBA1C" in the last 72 hours. CBG: Recent Labs  Lab 05/14/22 0832 05/14/22 1256 05/14/22 1653 05/14/22 2022 05/15/22 0856  GLUCAP 171* 146* 140* 131* 109*    Lipid Profile: No results for input(s): "CHOL", "HDL", "LDLCALC", "TRIG", "CHOLHDL", "LDLDIRECT"  in the last 72 hours. Thyroid Function Tests: No results for input(s): "TSH", "T4TOTAL", "FREET4", "T3FREE", "THYROIDAB" in the last 72 hours. Anemia Panel: No results for input(s): "VITAMINB12", "FOLATE", "FERRITIN", "TIBC", "IRON", "RETICCTPCT" in the last 72 hours. Sepsis Labs: No results for input(s): "PROCALCITON", "LATICACIDVEN" in the last 168 hours.  No results found for this or any previous visit (from the past 240 hour(s)).   Radiology Studies: DG Knee Right Port  Result Date: 05/13/2022 CLINICAL DATA:  Fracture tibia and fibula EXAM: PORTABLE RIGHT KNEE - 1-2 VIEW COMPARISON:  05/12/2022 FINDINGS: There is interval reduction and internal fixation of comminuted displaced fracture of proximal shaft of tibia with metallic plate and multiple surgical screws. There is improvement in alignment. There is essentially undisplaced fracture in the neck of right fibula. Arterial calcifications are seen in soft tissues. IMPRESSION: Interval reduction and internal fixation of severely comminuted fracture of proximal shaft of right tibia. Electronically Signed   By: Elmer Picker M.D.   On: 05/13/2022 20:47   DG Shoulder Left  Result Date: 05/13/2022 CLINICAL DATA:  Trauma, pain EXAM: LEFT SHOULDER - 2+ VIEW COMPARISON:  None Available. FINDINGS: No displaced fracture or dislocation is seen. Osteopenia is seen in bony structures. Degenerative changes are noted in left AC joint. Pacemaker/defibrillator battery is seen in left infraclavicular region. IMPRESSION: No recent fracture or dislocation is seen in left  shoulder. Electronically Signed   By: Elmer Picker M.D.   On: 05/13/2022 20:41   DG Tibia/Fibula Right  Result Date: 05/13/2022 CLINICAL DATA:  Open reduction internal fixation right tibia fracture. Intraoperative fluoroscopy. EXAM: RIGHT TIBIA AND FIBULA - 2 VIEW COMPARISON:  Right knee radiographs 05/12/2022 FINDINGS: Images were performed intraoperatively without the presence of a radiologist. The patient is undergoing lateral plate and screw fixation of the previously seen angulated and mildly displaced proximal tibial metaphyseal and diaphyseal fracture. There is improved alignment. Total fluoroscopy images: 7 Total fluoroscopy time: 30 seconds Total dose: Radiation Exposure Index (as provided by the fluoroscopic device): 2.75 mGy air Kerma Please see intraoperative findings for further detail. IMPRESSION: Intraoperative fluoroscopy provided for lateral plate and screw fixation of the proximal tibial fracture. Electronically Signed   By: Yvonne Kendall M.D.   On: 05/13/2022 17:49   DG C-Arm 1-60 Min-No Report  Result Date: 05/13/2022 Fluoroscopy was utilized by the requesting physician.  No radiographic interpretation.    Scheduled Meds:  acetaminophen  650 mg Oral Q6H   amitriptyline  50 mg Oral QHS   apixaban  5 mg Oral BID   calcium-vitamin D  1 tablet Oral QPM   citalopram  20 mg Oral QPM   docusate sodium  100 mg Oral BID   feeding supplement  237 mL Oral BID BM   finasteride  5 mg Oral QPM   insulin aspart  0-5 Units Subcutaneous QHS   insulin aspart  0-9 Units Subcutaneous TID WC   metoprolol succinate  25 mg Oral Daily   montelukast  10 mg Oral QHS   multivitamin with minerals  1 tablet Oral Daily   rosuvastatin  5 mg Oral Q M,W,F   Continuous Infusions:  lactated ringers 75 mL/hr at 05/15/22 1121   methocarbamol (ROBAXIN) IV       LOS: 2 days   Darliss Cheney, MD [facility]  05/15/2022, 11:42 AM   *Please note that this is a verbal dictation therefore  any spelling or grammatical errors are due to the "[employer]" system interpretation.  Please  page via [place] and do not message via secure chat for urgent patient care matters. Secure chat can be used for non urgent patient care matters.  How to contact the [facility] Attending or Consulting provider [place] or covering provider during after hours [place], for this patient?  Check the care team in [facility] and look for a) attending/consulting [facility] provider listed and b) the [facility] team listed. Page or secure chat [phone]. Log into [email or web address] and use [facility]'s universal password to access. If you do not have the password, please contact the hospital operator. Locate the [facility] provider you are looking for under [facility] and page to a number that you can be directly reached. If you still have difficulty reaching the provider, please page the [facility] (Director on Call) for the Hospitalists listed on amion for assistance.

## 2022-05-15 NOTE — NC FL2 (Signed)
[place] LEVEL OF CARE FORM     IDENTIFICATION  Patient Name: Nathaniel Woods Birthdate: 1928-06-14 Sex: male Admission Date (Current Location): 05/13/2022  [facility] and [place] Number:  Herbalist and Address:  [facility] [place], [place] [address] [place], [place] [address]      Provider Number: [id number]  Attending Physician Name and Address:  Darliss Cheney, MD  Relative Name and Phone Number:  Jahmeek, Shirk   [phone]    Current Level of Care: Hospital Recommended Level of Care: [place] Prior Approval Number:    Date Approved/Denied:   PASRR Number: [id number]  Discharge Plan: SNF    Current Diagnoses: Patient Active Problem List   Diagnosis Date Noted   Closed right tibial fracture 05/13/2022   Fall 05/13/2022   Head injury 05/13/2022   Right tibial fracture 05/13/2022   Recurrent falls 05/08/2022   Asthma, chronic 05/08/2022   BPH (benign prostatic hyperplasia) 05/08/2022   Depression 05/08/2022   Essential hypertension 05/08/2022   Acute midline low back pain without sciatica 05/08/2022   Generalized weakness 05/08/2022   Thrombocytopenia ([place]) 74/82/7078   Acute metabolic encephalopathy 67/54/4920   Chronic systolic CHF (congestive heart failure) (HCC)    Paroxysmal atrial fibrillation (HCC)    Urinary tract infection    Hip fracture (HCC) 03/28/2019   Carotid stenosis 08/14/2017   AICD (automatic cardioverter/defibrillator) present 12/17/2016   Type II diabetes mellitus ([place]) 11/02/2011   Coronary artery disease 11/02/2011   HTN (hypertension) 11/02/2011   Abdominal aortic aneurysm ([place]) 11/02/2011   H/O Ventricular Fibrillation 11/02/2011   S/P CABG x 4 11/02/2011   Hemorrhagic stroke ([place])    Dyslipidemia     Orientation RESPIRATION BLADDER Height & Weight     Self  Normal Incontinent Weight: 182 lb 15.7 oz (83 kg) Height:  '6\' 3"'$  (190.5 cm)  BEHAVIORAL SYMPTOMS/MOOD  NEUROLOGICAL BOWEL NUTRITION STATUS      Continent Diet (see discharge summary)  AMBULATORY STATUS COMMUNICATION OF NEEDS Skin   Total Care Verbally Surgical wounds                       Personal Care Assistance Level of Assistance  Bathing, Feeding, Dressing, Total care Bathing Assistance: Maximum assistance Feeding assistance: Limited assistance Dressing Assistance: Maximum assistance Total Care Assistance: Maximum assistance   Functional Limitations Info  Sight, Hearing, Speech Sight Info: Adequate Hearing Info: Impaired Speech Info: Adequate    SPECIAL CARE FACTORS FREQUENCY  PT (By licensed PT), OT (By licensed OT)     PT Frequency: 5x week OT Frequency: 5x week            Contractures Contractures Info: Not present    Additional Factors Info  Code Status, Allergies, Insulin Sliding Scale Code Status Info: full Allergies Info: Levofloxacin, Ambien (Zolpidem Tartrate), Aspirin, Bee Venom, Chlorpheniramine, Decongestant (Pseudoephedrine Hcl), Simvastatin, Zolpidem, Contrast Media (Iodinated Contrast Media)   Insulin Sliding Scale Info: Novolog: see discharge summary       Current Medications (05/15/2022):  This is the current hospital active medication list Current Facility-Administered Medications  Medication Dose Route Frequency Provider Last Rate Last Admin   acetaminophen (TYLENOL) tablet 650 mg  650 mg Oral Q6H Ainsley Spinner, PA-C   650 mg at 05/15/22 1122   amitriptyline (ELAVIL) tablet 50 mg  50 mg Oral QHS Elgergawy, Silver Huguenin, MD   50 mg at 05/14/22 2148   apixaban (ELIQUIS) tablet 5 mg  5 mg Oral BID Ainsley Spinner, PA-C   5 mg at 05/15/22 6160   calcium-vitamin D (OSCAL WITH D) 500-5 MG-MCG per tablet 1 tablet  1 tablet Oral QPM Elgergawy, Silver Huguenin, MD   1 tablet at 05/14/22 1746   citalopram (CELEXA) tablet 20 mg  20 mg Oral QPM Elgergawy, Silver Huguenin, MD   20 mg at 05/14/22 1746   docusate sodium (COLACE) capsule 100 mg  100 mg Oral BID Elgergawy, Silver Huguenin,  MD   100 mg at 05/15/22 0855   feeding supplement (ENSURE ENLIVE / ENSURE PLUS) liquid 237 mL  237 mL Oral BID BM Pahwani, Einar Grad, MD   237 mL at 05/15/22 0855   finasteride (PROSCAR) tablet 5 mg  5 mg Oral QPM Elgergawy, Silver Huguenin, MD   5 mg at 05/14/22 1746   HYDROmorphone (DILAUDID) injection 0.5 mg  0.5 mg Intravenous Q3H PRN Ainsley Spinner, PA-C       insulin aspart (novoLOG) injection 0-5 Units  0-5 Units Subcutaneous QHS Pahwani, Ravi, MD       insulin aspart (novoLOG) injection 0-9 Units  0-9 Units Subcutaneous TID WC Darliss Cheney, MD   1 Units at 05/14/22 1747   lactated ringers infusion   Intravenous Continuous Darliss Cheney, MD 75 mL/hr at 05/15/22 1121 New Bag at 05/15/22 1121   methocarbamol (ROBAXIN) tablet 500 mg  500 mg Oral Q6H PRN Elgergawy, Silver Huguenin, MD   500 mg at 05/15/22 0110   Or   methocarbamol (ROBAXIN) 500 mg in dextrose 5 % 50 mL IVPB  500 mg Intravenous Q6H PRN Elgergawy, Silver Huguenin, MD       metoprolol succinate (TOPROL-XL) 24 hr tablet 25 mg  25 mg Oral Daily Elgergawy, Silver Huguenin, MD   25 mg at 05/15/22 0855   montelukast (SINGULAIR) tablet 10 mg  10 mg Oral QHS Elgergawy, Silver Huguenin, MD   10 mg at 05/14/22 2148   multivitamin with minerals tablet 1 tablet  1 tablet Oral Daily Darliss Cheney, MD   1 tablet at 05/15/22 0855   oxyCODONE (Oxy IR/ROXICODONE) immediate release tablet 5 mg  5 mg Oral Q4H PRN Ainsley Spinner, PA-C   5 mg at 05/14/22 2148   polyethylene glycol (MIRALAX / GLYCOLAX) packet 17 g  17 g Oral Daily PRN Elgergawy, Silver Huguenin, MD       rosuvastatin (CRESTOR) tablet 5 mg  5 mg Oral Q M,W,F Elgergawy, Silver Huguenin, MD   5 mg at 05/14/22 1002     Discharge Medications: Please see discharge summary for a list of discharge medications.  Relevant Imaging Results:  Relevant Lab Results:   Additional Information SSN: [id number], pt is vaccinated for covid with boosters.  Joanne Chars, LCSW

## 2022-05-15 NOTE — Progress Notes (Signed)
Orthopaedic Trauma Service Progress Note  Patient ID: Nathaniel Woods MRN: [id number] DOB/AGE: 86-06-1928 86 y.o.  Subjective:  Ortho issues stable Therapy notes reviewed   + 2 assist   ROS As above  Objective:   VITALS:   Vitals:   05/14/22 1224 05/14/22 2020 05/15/22 0406 05/15/22 0854  BP: 121/69 129/68 123/67 114/65  Pulse: 98 60 68 83  Resp: 16 (!) '25 14 20  '$ Temp: 98.3 F (36.8 C)  98.1 F (36.7 C) (!) 97.5 F (36.4 C)  TempSrc: Oral  Oral Oral  SpO2: 100% 100% 100% 100%  Weight:      Height:        Estimated body mass index is 22.87 kg/m as calculated from the following:   Height as of this encounter: '6\' 3"'$  (1.905 m).   Weight as of this encounter: 83 kg.   Intake/Output      12/06 0701 12/07 0700 12/07 0701 12/08 0700   P.O. 240    I.V. (mL/kg)     IV Piggyback 300    Total Intake(mL/kg) 540 (6.5)    Urine (mL/kg/hr) 150 (0.1)    Blood     Total Output 150    Net +390           LABS  Results for orders placed or performed during the hospital encounter of 05/13/22 (from the past 24 hour(s))  Hemoglobin and hematocrit, blood     Status: Abnormal   Collection Time: 05/14/22  4:25 PM  Result Value Ref Range   Hemoglobin 7.5 (L) 13.0 - 17.0 g/dL   HCT 22.4 (L) 39.0 - 52.0 %  Glucose, capillary     Status: Abnormal   Collection Time: 05/14/22  4:53 PM  Result Value Ref Range   Glucose-Capillary 140 (H) 70 - 99 mg/dL  Glucose, capillary     Status: Abnormal   Collection Time: 05/14/22  8:22 PM  Result Value Ref Range   Glucose-Capillary 131 (H) 70 - 99 mg/dL  CBC with Differential/Platelet     Status: Abnormal   Collection Time: 05/15/22  3:43 AM  Result Value Ref Range   WBC 13.1 (H) 4.0 - 10.5 K/uL   RBC 2.16 (L) 4.22 - 5.81 MIL/uL   Hemoglobin 7.1 (L) 13.0 - 17.0 g/dL   HCT 21.7 (L) 39.0 - 52.0 %   MCV 100.5 (H) 80.0 - 100.0 fL   MCH 32.9 26.0 - 34.0 pg   MCHC  32.7 30.0 - 36.0 g/dL   RDW 14.4 11.5 - 15.5 %   Platelets 154 150 - 400 K/uL   nRBC 0.4 (H) 0.0 - 0.2 %   Neutrophils Relative % 85 %   Neutro Abs 11.0 (H) 1.7 - 7.7 K/uL   Lymphocytes Relative 5 %   Lymphs Abs 0.7 0.7 - 4.0 K/uL   Monocytes Relative 9 %   Monocytes Absolute 1.2 (H) 0.1 - 1.0 K/uL   Eosinophils Relative 0 %   Eosinophils Absolute 0.0 0.0 - 0.5 K/uL   Basophils Relative 0 %   Basophils Absolute 0.0 0.0 - 0.1 K/uL   Immature Granulocytes 1 %   Abs Immature Granulocytes 0.13 (H) 0.00 - 0.07 K/uL  Basic metabolic panel     Status: Abnormal   Collection Time: 05/15/22  3:43 AM  Result Value  Ref Range   Sodium 134 (L) 135 - 145 mmol/L   Potassium 5.0 3.5 - 5.1 mmol/L   Chloride 106 98 - 111 mmol/L   CO2 21 (L) 22 - 32 mmol/L   Glucose, Bld 129 (H) 70 - 99 mg/dL   BUN 24 (H) 8 - 23 mg/dL   Creatinine, Ser 1.45 (H) 0.61 - 1.24 mg/dL   Calcium 8.7 (L) 8.9 - 10.3 mg/dL   GFR, Estimated 45 (L) >60 mL/min   Anion gap 7 5 - 15  Glucose, capillary     Status: Abnormal   Collection Time: 05/15/22  8:56 AM  Result Value Ref Range   Glucose-Capillary 109 (H) 70 - 99 mg/dL  Glucose, capillary     Status: Abnormal   Collection Time: 05/15/22 12:09 PM  Result Value Ref Range   Glucose-Capillary 116 (H) 70 - 99 mg/dL     PHYSICAL EXAM:   Gen: awake, alert, NAD, sitting up in bed  Lungs: unlabored Ext:       Right Lower Extremity              Dressing R leg clean, dry and intact. ACE wrap stable             Moderate swelling R leg             Ext warm              Moves toes w/o difficulty              DPN, SPN, TN sensation grossly intact             EHL, FHL, lesser toe motor intact             Ankle flexion, extension, inversion and eversion intact             No DCT              Compartments are soft  Assessment/Plan: 2 Days Post-Op     Anti-infectives (From admission, onward)    Start     Dose/Rate Route Frequency Ordered Stop   05/13/22 2300   ceFAZolin (ANCEF) IVPB 2g/100 mL premix        2 g 200 mL/hr over 30 Minutes Intravenous Every 8 hours 05/13/22 1927 05/14/22 1701   05/13/22 1442  ceFAZolin (ANCEF) 2-4 GM/100ML-% IVPB       Note to Pharmacy: Cameron Sprang M: cabinet override      05/13/22 1442 05/14/22 0244     .  POD/HD#: 2  88 y/o male s/p fall with R bicondylar tibial plateau fracture   -fall   - R bicondylar tibial plateau fracture (fragility fracture) s/p ORIF             NWB R leg x 6 weeks             Unrestricted ROM R knee             Elevate heels off bed             Ice prn              PT/OT             Dressing changes as needed starting tomorrow    PT- please teach HEP for R knee ROM- AROM, PROM. No ROM restrictions.  Quad sets, SLR, LAQ, SAQ, heel slides, stretching   Ankle theraband program, heel cord stretching, toe towel curls, etc   No  pillows under bend of knee when at rest, ok to place under heel to help work on extension. Can also use zero knee bone foam if available     - Pain management:             Multimodal             Minimize narcotics   - ABL anemia/Hemodynamics             cbc in am   - Medical issues              Per primary    - DVT/PE prophylaxis:             Eliquis restarted   - ID:              Periop abx   - Metabolic Bone Disease:             Vitamin d levels ok but fracture is a fragility fracture             Optimize nutrition               - Activity:             As above   - Impediments to fracture healing:             Age             Poor bone quality               - Dispo:             Therapy evals             Likely SNF   Jari Pigg, PA-C [phone] (C) 05/15/2022, 1:02 PM  [facility] [place] [address] [phone] Jenetta [phone] (F)    After 5pm and on the weekends please log on to Amion, go to orthopaedics and the look under the [facility] Call for the provider(s) on  call. You can also call our office at [phone] and then follow the prompts to be connected to the call team.  Patient ID: Sande Brothers, male   DOB: Feb 12, 1929, 88 y.o.   MRN: [id number]

## 2022-05-15 NOTE — Plan of Care (Signed)

## 2022-05-15 NOTE — Progress Notes (Signed)
ANTICOAGULATION CONSULT NOTE - Initial Consult  Pharmacy Consult for apixaban S/p ORIF 12/5 Indication: atrial fibrillation  Allergies  Allergen Reactions   Levofloxacin Rash   Ambien [Zolpidem Tartrate] Other (See Comments)    Hallucinations and per pt he walked around at night and doesn't remember.   Aspirin Other (See Comments)    Hemorrhagic Stroke   Bee Venom Other (See Comments)    Reaction: fever   Chlorpheniramine Other (See Comments)    Reaction: unknown   Decongestant [Pseudoephedrine Hcl] Other (See Comments)    Reaction: unknown   Simvastatin Other (See Comments)    Other reaction(s): Muscle Pain   Zolpidem Other (See Comments)    Other reaction(s): Unknown "went bananas"   Contrast Media [Iodinated Contrast Media] Rash    Patient Measurements: Height: '6\' 3"'$  (190.5 cm) Weight: 83 kg (182 lb 15.7 oz) IBW/kg (Calculated) : 84.5  Vital Signs: Temp: 98.1 F (36.7 C) (12/07 0406) Temp Source: Oral (12/07 0406) BP: 123/67 (12/07 0406) Pulse Rate: 68 (12/07 0406)  Labs: Recent Labs    05/13/22 0015 05/13/22 0305 05/13/22 0523 05/14/22 0313 05/14/22 0313 05/14/22 1625 05/15/22 0343  HGB  --   --   --  7.3*   < > 7.5* 7.1*  HCT  --   --   --  22.6*  --  22.4* 21.7*  PLT  --   --   --  142*  --   --  154  CREATININE  --   --   --  1.23  --   --  1.45*  TROPONINIHS 67* 63* 68*  --   --   --   --    < > = values in this interval not displayed.    Estimated Creatinine Clearance: 39 mL/min (A) (by C-G formula based on SCr of 1.45 mg/dL (H)).   Medical History: Past Medical History:  Diagnosis Date   AAA (abdominal aortic aneurysm) without rupture (HCC)    Acute renal failure superimposed on stage 3a chronic kidney disease ([place])    AICD (automatic cardioverter/defibrillator) present 12/17/2016   Angina    Anxiety    "just before major surgery"   Arthritis    Asthma    childhood   Basal cell carcinoma 10/07/2021   left superior ear, Exc 10/15/2021    BPH associated with nocturia    Chronic systolic CHF (congestive heart failure) ([place])    Coronary artery disease    Hemorrhagic stroke ([place]) 09/07/2008   S/P fall   HOH (hard of hearing)    Hyperlipidemia    Hypertension    Hyponatremia 05/08/2022   Inferior MI ([place]) 10/08/2011   Kidney stone    PAF (paroxysmal atrial fibrillation) (HCC)    Sinus bradycardia    Stroke ([place])    Type II diabetes mellitus ([place])    Improved with weight loss   Wears hearing aid in both ears     Medications:  Medications Prior to Admission  Medication Sig Dispense Refill Last Dose   acetaminophen (TYLENOL) 325 MG tablet Take 2 tablets (650 mg total) by mouth every 6 (six) hours as needed for mild pain (or Fever >/= 101).      amitriptyline (ELAVIL) 50 MG tablet Take 50 mg by mouth at bedtime.      apixaban (ELIQUIS) 5 MG TABS tablet Take 5 mg by mouth 2 (two) times daily.    05/11/2022 at 2000   Calcium Carbonate-Vit D-Min (CALCIUM 600+D3 PLUS MINERALS) 600-800 MG-UNIT TABS  Take 1 tablet by mouth every evening.      citalopram (CELEXA) 20 MG tablet Take 20 mg by mouth every evening.      Coenzyme Q10 (COQ10) 200 MG CAPS Take 200 mg by mouth every evening.      finasteride (PROSCAR) 5 MG tablet Take 5 mg by mouth every evening.      latanoprost (XALATAN) 0.005 % ophthalmic solution Place 1 drop into both eyes at bedtime.  (Patient not taking: Reported on 05/08/2022)  2    lidocaine (LIDODERM) 5 % Place 1 patch onto the skin daily. Remove & Discard patch within 12 hours or as directed by MD 30 patch 0    metoprolol succinate (TOPROL-XL) 25 MG 24 hr tablet Take 25 mg by mouth daily.      montelukast (SINGULAIR) 10 MG tablet Take 10 mg by mouth at bedtime.       rosuvastatin (CRESTOR) 5 MG tablet Take 5 mg by mouth every Monday, Wednesday, and Friday. (Evening)      traMADol (ULTRAM) 50 MG tablet Take 1 tablet (50 mg total) by mouth every 6 (six) hours as needed for moderate pain (despite tylenol). 30  tablet 0     Assessment:  Nathaniel Woods  is a 88 y.o. male, with medical history significant for CAD, chronic systolic CHF, CVA, atrial fibrillation on Eliquis, multiple falls. Patient was admitted d/t fall and required surgery. S/p ORIF 12/5. Acute blood loss anemia with hgb down to 7.3 post op. Pharmacy consulted to restart apixaban. Hgb mostly stable at 7.1. Per Ortho, okay to restart 12/7. Note SCr is up to 1.45 today. If goes up anymore, may need to decrease dose d/t age and SCr.  Goal of Therapy:  Therapeutic anticoagulation Monitor platelets by anticoagulation protocol: Yes   Plan:  Restart apixaban 5 mg PO BID Monitor Hgb and for signs and symptoms of bleeding Monitor SCr    Thank you for allowing [place] to participate in this patients care. Jens Som, PharmD 05/15/2022 8:53 AM  **Pharmacist phone directory can be found on [email or web address] listed under [place]**

## 2022-05-15 NOTE — TOC Initial Note (Addendum)
Transition of Care [facility]) - Initial/Assessment Note    Patient Details  Name: Nathaniel Woods MRN: [id number] Date of Birth: 01-17-1929  [facility] CM/SW Contact:    Joanne Chars, LCSW Phone Number: 05/15/2022, 11:54 AM  Clinical Narrative:  Pt oriented x1, CSW spoke with son [name] regarding DC recommendation for SNF.  John in agreement with SNF, would like [facility], also interested in [facility].  Pt lives at home with his wife with [name] providing a lot of support.  They do have [place] aide in place daytime hours 7 days per week.  Pt is vaccinated for covid.  Referral sent out in hub for SNF.  CSW reached out to [facility] [facility] and [facility] [employer] to review referral.                 [id number]: Message from [facility].  No current bed availability. She will let CSW know if bed opens in next few days.    Expected Discharge Plan: Skilled Nursing Facility Barriers to Discharge: Continued Medical Work up, SNF Pending bed offer   Patient Goals and CMS Choice   CMS Medicare.gov Compare Post Acute Care list provided to:: Patient Represenative (must comment) (son [name]) Choice offered to / list presented to : Adult Children  Expected Discharge Plan and Services Expected Discharge Plan: [place] In-house Referral: Clinical Social Work   Post Acute Care Choice: [place] Living arrangements for the past 2 months: [place]                                      Prior Living Arrangements/Services Living arrangements for the past 2 months: [place] Lives with:: Spouse Patient language and need for interpreter reviewed:: Yes        Need for Family Participation in Patient Care: Yes (Comment) Care giver support system in place?: Yes (comment) Current home services: Homehealth aide (7 days per week) Criminal Activity/Legal Involvement Pertinent to Current Situation/Hospitalization: No - Comment as  needed  Activities of Daily Living      Permission Sought/Granted                  Emotional Assessment Appearance:: Appears stated age Attitude/Demeanor/Rapport: Unable to Assess Affect (typically observed): Pleasant Orientation: : Oriented to Self      Admission diagnosis:  Right tibial fracture [S82.201A] Patient Active Problem List   Diagnosis Date Noted   Closed right tibial fracture 05/13/2022   Fall 05/13/2022   Head injury 05/13/2022   Right tibial fracture 05/13/2022   Recurrent falls 05/08/2022   Asthma, chronic 05/08/2022   BPH (benign prostatic hyperplasia) 05/08/2022   Depression 05/08/2022   Essential hypertension 05/08/2022   Acute midline low back pain without sciatica 05/08/2022   Generalized weakness 05/08/2022   Thrombocytopenia ([place]) 47/65/4650   Acute metabolic encephalopathy 35/46/5681   Chronic systolic CHF (congestive heart failure) (HCC)    Paroxysmal atrial fibrillation ([place])    Urinary tract infection    Hip fracture ([place]) 03/28/2019   Carotid stenosis 08/14/2017   AICD (automatic cardioverter/defibrillator) present 12/17/2016   Type II diabetes mellitus ([place]) 11/02/2011   Coronary artery disease 11/02/2011   HTN (hypertension) 11/02/2011   Abdominal aortic aneurysm ([place]) 11/02/2011   H/O Ventricular Fibrillation 11/02/2011   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

## 2022-05-16 DIAGNOSIS — S82191S Other fracture of upper end of right tibia, sequela: Secondary | ICD-10-CM | POA: Diagnosis not present

## 2022-05-16 LAB — BASIC METABOLIC PANEL
Anion gap: 7 (ref 5–15)
BUN: 24 mg/dL — ABNORMAL HIGH (ref 8–23)
CO2: 22 mmol/L (ref 22–32)
Calcium: 9 mg/dL (ref 8.9–10.3)
Chloride: 107 mmol/L (ref 98–111)
Creatinine, Ser: 1.3 mg/dL — ABNORMAL HIGH (ref 0.61–1.24)
GFR, Estimated: 52 mL/min — ABNORMAL LOW (ref >60)
Glucose, Bld: 81 mg/dL (ref 70–99)
Potassium: 5 mmol/L (ref 3.5–5.1)
Sodium: 136 mmol/L (ref 135–145)

## 2022-05-16 LAB — CBC
HCT: 23.3 % — ABNORMAL LOW (ref 39.0–52.0)
HCT: 25.7 % — ABNORMAL LOW (ref 39.0–52.0)
Hemoglobin: 7.6 g/dL — ABNORMAL LOW (ref 13.0–17.0)
Hemoglobin: 8.1 g/dL — ABNORMAL LOW (ref 13.0–17.0)
MCH: 33.5 pg (ref 26.0–34.0)
MCH: 33.2 pg (ref 26.0–34.0)
MCHC: 32.6 g/dL (ref 30.0–36.0)
MCHC: 31.5 g/dL (ref 30.0–36.0)
MCV: 102.6 fL — ABNORMAL HIGH (ref 80.0–100.0)
MCV: 105.3 fL — ABNORMAL HIGH (ref 80.0–100.0)
Platelets: 197 10*3/uL (ref 150–400)
Platelets: 219 10*3/uL (ref 150–400)
RBC: 2.27 MIL/uL — ABNORMAL LOW (ref 4.22–5.81)
RBC: 2.44 MIL/uL — ABNORMAL LOW (ref 4.22–5.81)
RDW: 14.4 % (ref 11.5–15.5)
RDW: 14.6 % (ref 11.5–15.5)
WBC: 11 10*3/uL — ABNORMAL HIGH (ref 4.0–10.5)
WBC: 12.1 10*3/uL — ABNORMAL HIGH (ref 4.0–10.5)
nRBC: 0.6 % — ABNORMAL HIGH (ref 0.0–0.2)
nRBC: 0.8 % — ABNORMAL HIGH (ref 0.0–0.2)

## 2022-05-16 LAB — GLUCOSE, CAPILLARY
Glucose-Capillary: 84 mg/dL (ref 70–99)
Glucose-Capillary: 79 mg/dL (ref 70–99)
Glucose-Capillary: 101 mg/dL — ABNORMAL HIGH (ref 70–99)
Glucose-Capillary: 108 mg/dL — ABNORMAL HIGH (ref 70–99)

## 2022-05-16 MED ORDER — LATANOPROST 0.005 % OP SOLN
1.0000 [drp] | Freq: Every day | OPHTHALMIC | Status: DC
  Administered 2022-05-18 – 2022-05-19 (×2): 1 [drp] via OPHTHALMIC
  Filled 2022-05-16: qty 2.5

## 2022-05-16 MED ORDER — OXYCODONE HCL 5 MG PO TABS: 5.0000 mg | ORAL_TABLET | ORAL | 0 refills | Status: DC | PRN

## 2022-05-16 NOTE — Plan of Care (Signed)
  Problem: Nutrition: Goal: Adequate nutrition will be maintained Outcome: Progressing   Problem: Safety: Goal: Ability to remain free from injury will improve Outcome: Progressing   

## 2022-05-16 NOTE — Progress Notes (Signed)
Physical Therapy Treatment Patient Details Name: Nathaniel Woods MRN: [id number] DOB: 1928-09-15 Today's Date: 05/16/2022   History of Present Illness Pt is a 88 y/o male who presents from [facility] for ORIF of R tibial plateau fracture on 05/13/2022. He will be NWB on the RLE for 6 weeks. PMH significant for multiple recent admissions 2 falls, AAA, CKD III, AICD, CHF, CAD, HOH, HTN, MI, PAF, bradycardia, CVA, DM II, R hip fracture fixation 1992.    PT Comments    Pt received in supine in regular bed, agitated and c/o severe RLE pain, RN called to room to premedicate him and PTA re-attempted 30 mins later, pt more accepting of assist to mobilize but still limited due to cognitive deficit and with poor tolerance for repositioning to air bed for pressure relief. Pt instructed on need to continue BLE ROM as tolerated and reoriented to situation but pt not receptive. Handout for LE HEP brought to room to review with pt family members next session if caregiver present. Ice applied to RLE for comfort and pt readjusted in air bed for pressure relief and to reduce risk of aspiration. Pt having difficulty self-feeding and was unable to drink through straw, so pt was given small sips of water by cup. NT notified pt will need feeding assist and to drink more liquids as he is not able to do this himself this evening. Pt continues to benefit from PT services to progress toward functional mobility goals.    Recommendations for follow up therapy are one component of a multi-disciplinary discharge planning process, led by the attending physician.  Recommendations may be updated based on patient status, additional functional criteria and insurance authorization.  Follow Up Recommendations  Skilled nursing-short term rehab (<3 hours/day) Can patient physically be transported by private vehicle: No   Assistance Recommended at Discharge Frequent or constant Supervision/Assistance  Patient can return home with the following  Help with stairs or ramp for entrance;Assist for transportation;Assistance with cooking/housework;Direct supervision/assist for medications management;Two people to help with walking and/or transfers;Two people to help with bathing/dressing/bathroom;Direct supervision/assist for financial management;Assistance with feeding   Equipment Recommendations  Other (comment) (TBD post-acute; currently hospital bed/mechanical lift level)    Recommendations for Other Services       Precautions / Restrictions Precautions Precautions: Fall;Back Precaution Booklet Issued: No Restrictions Weight Bearing Restrictions: Yes RLE Weight Bearing: Non weight bearing     Mobility  Bed Mobility Overal bed mobility: Needs Assistance Bed Mobility: Rolling, Supine to Sit Rolling: Total assist, +2 for physical assistance   Supine to sit: Total assist, +2 for physical assistance, HOB elevated (only partial to long sitting due to pt pain/agitation)     General bed mobility comments: pt needed totalA for partial roll in supine, pt too agitated and in too much pain to roll; attempted to assist pt to long sitting for insertion of pillows behind his head/trunk but pt also crying out in pain/frustration. TotalA +4 for transfer in supine from regular bed to air bed using bed pads and one person assisting with BLE for safety.    Transfers                   General transfer comment: Deferred for safety due to weight bearing restrictions and decreased cognition to maintain. Pt with no recall of WB restrictions and agitated with minimal mobility      Balance Overall balance assessment: History of Falls, Needs assistance Sitting-balance support: No upper extremity supported, Feet supported  Sitting balance - Comments: pt unable to tolerate this date due to pain/fear of mobility and cognitive deficit                                    Cognition Arousal/Alertness: Awake/alert Behavior During  Therapy: Impulsive, Agitated, Anxious Overall Cognitive Status: Impaired/Different from baseline Area of Impairment: Orientation, Attention, Memory, Following commands, Safety/judgement, Awareness, Problem solving                 Orientation Level: Disoriented to, Time, Situation Current Attention Level: Focused Memory: Decreased short-term memory, Decreased recall of precautions Following Commands: Follows one step commands inconsistently Safety/Judgement: Decreased awareness of safety, Decreased awareness of deficits Awareness: Intellectual Problem Solving: Slow processing, Decreased initiation, Difficulty sequencing, Requires verbal cues, Requires tactile cues General Comments: Pt received and very agitated, confused and states he was given tylenol but dropped it on himself. Pt seen to have open sugar packet over his body in bed and other condiments on him but no pills. Pt reoriented that RN had already medicated him but pt remained agitated, so RN called to room as pt PAIN-AD score 9 and was able to give him more pain medication. PTA re-entered room ~30 mins later and pt less agitated but still anxious/confused during transfer to new bed.        Exercises Other Exercises Other Exercises: supine RLE AA/PROM: SLR (limited ROM, mostly PROM while repositioning in supine x5 reps), ankle pumps (AA); LLE AAROM: heel slides, hip abduction x5 reps ea handout left in room to reinforce supine exercises, family not present for instruction will continue in following sessions    General Comments General comments (skin integrity, edema, etc.): VSS per chart review, pt premedicated after PTA initially attempted and slightly more participatory but still needing max cues and totalA for minimal bed level mobility.      Pertinent Vitals/Pain Pain Assessment Pain Assessment: PAINAD Faces Pain Scale: Hurts whole lot Breathing: occasional labored breathing, short period of hyperventilation Negative  Vocalization: repeated troubled calling out, loud moaning/groaning, crying Facial Expression: facial grimacing Body Language: rigid, fists clenched, knees up, pushing/pulling away, strikes out Consolability: unable to console, distract or reassure PAINAD Score: 9 Pain Location: RLE Pain Descriptors / Indicators: Sore, Grimacing, Moaning, Other (Comment) (prior to premedication, pt gesturing as if to strike at staff members; pt rigid but not attempting to strike at staff members after RN medicated him more for pain) Pain Intervention(s): Limited activity within patient's tolerance, Monitored during session, Premedicated before session, Repositioned, Ice applied     PT Goals (current goals can now be found in the care plan section) Acute Rehab PT Goals Patient Stated Goal: Decrease pain - pain control throughout hospital stay for comfort as well as agitation per son PT Goal Formulation: With patient/family Time For Goal Achievement: 05/28/22 Progress towards PT goals: Not progressing toward goals - comment (pain/agitation limting tolerance today)    Frequency    Min 3X/week      PT Plan Current plan remains appropriate       AM-PAC PT "6 Clicks" Mobility   Outcome Measure  Help needed turning from your back to your side while in a flat bed without using bedrails?: Total Help needed moving from lying on your back to sitting on the side of a flat bed without using bedrails?: Total Help needed moving to and from a bed to a chair (including a wheelchair)?: Total  Help needed standing up from a chair using your arms (e.g., wheelchair or bedside chair)?: Total Help needed to walk in hospital room?: Total Help needed climbing 3-5 steps with a railing? : Total 6 Click Score: 6    End of Session Equipment Utilized During Treatment: Oxygen;Other (comment) (blue bone foam to RLE) Activity Tolerance: Treatment limited secondary to agitation;Patient limited by pain Patient left: in  bed;with call bell/phone within reach;with bed alarm set;with SCD's reapplied;Other (comment) (in air bed with x4 rails up and pillow distal to prevalon boot/blue boam foam to prevent pt feet from sliding down and pushing on foot board; pt tall and air bed may be slightly too short for him) Nurse Communication: Mobility status;Patient requests pain meds;Other (comment) (pt agitation) PT Visit Diagnosis: Other abnormalities of gait and mobility (R26.89);Difficulty in walking, not elsewhere classified (R26.2)     Time: IN at 1750 (also in 1711 to 1718 but pt not cooperative due to pain so was premedicated prior to second attempt) -1802 (out)  PT Time Calculation (min) (ACUTE ONLY): 12 min  Charges:  $Therapeutic Activity: 8-22 mins                     Michaline Kindig P., PTA Acute Rehabilitation Services Secure Chat Preferred 9a-5:30pm Office: [place] 05/16/2022, 6:52 PM

## 2022-05-16 NOTE — Progress Notes (Signed)
PROGRESS NOTE    Nathaniel Woods  OYD:[id number] DOB: 1929-01-31 DOA: 05/13/2022 PCP: Derinda Late, MD   Brief Narrative:    Nathaniel Woods  is a 88 y.o. male, ith medical history significant for coronary artery disease, chronic systolic CHF, CVA, atrial fibrillation on Eliquis, hypertension, dyslipidemia and type 2 diabetes mellitus ,multiple falls, with recent admission at [facility] for fall, where he was discharged 12/2, apparently patient fell again at home today, patient currently is in PACU, very poor historian,  so history was obtained from ED records, and previous medical records, and son by phone ,apparently patient lives with his son, and daughter-in-law, he had a fall where he stated his legs were weak, does not appear to be associated with any loss of consciousness, dizziness or lightheadedness.  Patient has been complaining of right leg pain, where it was noted to be shortened, and rotated, he did have skin tears on the left dorsal hand as well. -In ED workup was significant for right proximal tibial fracture, ED physician at [facility] discussed with Dr. Ginette Pitman, who accepted the patient to be admitted to [facility] for surgical intervention, patient transferred directly to the OR, where he had surgical repair, he is currently in PACU with [facility] hospitalist consulted to admit.  Assessment & Plan:   Principal Problem:   Right tibial fracture Active Problems:   Recurrent falls   Paroxysmal atrial fibrillation (HCC)   Type II diabetes mellitus (HCC)   Dyslipidemia   Coronary artery disease   HTN (hypertension)   S/P CABG x 4   Chronic systolic CHF (congestive heart failure) (HCC)   AICD (automatic cardioverter/defibrillator) present   BPH (benign prostatic hyperplasia)   Fall  Right proximal tibial fracture  Recurrent falls -S/p surgical repair by Dr. Marcelino Scot 05/13/2022.  Eliquis resumed on 05/14/2022 after clearance from orthopedics.  Seen by PT OT, recommendations  for SNF.  Management per orthopedics.  TOC consulted.  Waiting for placement.  Acute blood loss anemia: Hemoglobin preoperatively 9.5, dropped to as low as 7.1 but improved, without requiring any blood transfusion.   Parox A-fib -Patient with known history of paroxysmal A-fib, he is currently with paced rhythm -CHA2DS2-VASc score> 2, continue metoprolol.  Resumed Eliquis.   Hypertension: Blood pressure controlled.  Continue Toprol-XL and a.m   Diabetes mellitus, type II -Does not appear to be on any medication, blood sugar controlled, he is not requiring any insulin, will discontinue SSI.   Depression -Continue with home medication includes Celexa and Remeron   BPH -Continue with Proscar   Asthma, chronic -no active wheezing currently, continue with Singulair, and as needed albuterol   History of CAD -Continue with Toprol-XL, statin, he is on Eliquis, not on aspirin as he is on Eliquis.   Dyslipidemia -Continue with statin   Depression - We will continue as Celexa and Remeron   BPH (benign prostatic hyperplasia) - We will continue as Proscar   Asthma, chronic - We will continue his Singulair and place him on as needed DuoNebs.   Coronary artery disease - We will continue his Toprol-XL and statin therapy.   Dyslipidemia - We will continue statin therapy.  DVT prophylaxis: Place and maintain sequential compression device Start: 05/13/22 1928   Code Status: Full Code  Family Communication:  None present at bedside.   Status is: Inpatient Remains inpatient appropriate because: Medically stable. Pending placement.   Estimated body mass index is 22.87 kg/m as calculated from the following:   Height as of  this encounter: '6\' 3"'$  (1.905 m).   Weight as of this encounter: 83 kg.  Pressure Injury 05/09/22 Sacrum Stage 1 -  Intact skin with non-blanchable redness of a localized area usually over a bony prominence. red, non-blanchable (Active)  05/09/22 1200  Location:  Sacrum  Location Orientation:   Staging: Stage 1 -  Intact skin with non-blanchable redness of a localized area usually over a bony prominence.  Wound Description (Comments): red, non-blanchable  Present on Admission: Yes  Dressing Type Foam - Lift dressing to assess site every shift 05/15/22 0830     Pressure Injury Buttocks Right (Active)     Location: Buttocks  Location Orientation: Right  Staging:   Wound Description (Comments):   Present on Admission:   Dressing Type Foam - Lift dressing to assess site every shift 05/11/22 0900     Pressure Injury 05/09/22 Buttocks Left Stage 2 -  Partial thickness loss of dermis presenting as a shallow open injury with a red, pink wound bed without slough. open area to buttocks (Active)  05/09/22 1200  Location: Buttocks  Location Orientation: Left  Staging: Stage 2 -  Partial thickness loss of dermis presenting as a shallow open injury with a red, pink wound bed without slough.  Wound Description (Comments): open area to buttocks  Present on Admission: Yes  Dressing Type Foam - Lift dressing to assess site every shift 05/15/22 0830   Nutritional Assessment: Body mass index is 22.87 kg/m.Marland Kitchen Seen by dietician.  I agree with the assessment and plan as outlined below: Nutrition Status: Nutrition Problem: Increased nutrient needs Etiology: post-op healing Signs/Symptoms: estimated needs Interventions: Ensure Enlive (each supplement provides 350kcal and 20 grams of protein), Liberalize Diet  . Skin Assessment: I have examined the patient's skin and I agree with the wound assessment as performed by the wound care RN as outlined below: Pressure Injury 05/09/22 Sacrum Stage 1 -  Intact skin with non-blanchable redness of a localized area usually over a bony prominence. red, non-blanchable (Active)  05/09/22 1200  Location: Sacrum  Location Orientation:   Staging: Stage 1 -  Intact skin with non-blanchable redness of a localized area usually  over a bony prominence.  Wound Description (Comments): red, non-blanchable  Present on Admission: Yes  Dressing Type Foam - Lift dressing to assess site every shift 05/15/22 0830     Pressure Injury Buttocks Right (Active)     Location: Buttocks  Location Orientation: Right  Staging:   Wound Description (Comments):   Present on Admission:   Dressing Type Foam - Lift dressing to assess site every shift 05/11/22 0900     Pressure Injury 05/09/22 Buttocks Left Stage 2 -  Partial thickness loss of dermis presenting as a shallow open injury with a red, pink wound bed without slough. open area to buttocks (Active)  05/09/22 1200  Location: Buttocks  Location Orientation: Left  Staging: Stage 2 -  Partial thickness loss of dermis presenting as a shallow open injury with a red, pink wound bed without slough.  Wound Description (Comments): open area to buttocks  Present on Admission: Yes  Dressing Type Foam - Lift dressing to assess site every shift 05/15/22 0830    Consultants:  Orthopedics  Procedures:  As above  Antimicrobials:  Anti-infectives (From admission, onward)    Start     Dose/Rate Route Frequency Ordered Stop   05/13/22 2300  ceFAZolin (ANCEF) IVPB 2g/100 mL premix        2 g 200 mL/hr over  30 Minutes Intravenous Every 8 hours 05/13/22 1927 05/14/22 1701   05/13/22 1442  ceFAZolin (ANCEF) 2-4 GM/100ML-% IVPB       Note to Pharmacy: Cameron Sprang M: cabinet override      05/13/22 1442 05/14/22 0244         Subjective:  Patient seen and 7.  He was sleepy but easily arousable.  Oriented x 1 to self as usual.  No other complaint.  Appears comfortable.  Objective: Vitals:   05/15/22 0854 05/15/22 2100 05/16/22 0827 05/16/22 1033  BP: 114/65  99/60 (!) 104/52  Pulse: 83 81 (!) 59 72  Resp: '20 14 16   '$ Temp: (!) 97.5 F (36.4 C)  98.6 F (37 C)   TempSrc: Oral  Oral   SpO2: 100%  100%   Weight:      Height:        Intake/Output Summary (Last 24 hours)  at 05/16/2022 1315 Last data filed at 05/16/2022 1000 Gross per 24 hour  Intake 764.9 ml  Output 600 ml  Net 164.9 ml    Filed Weights   05/13/22 1433  Weight: 83 kg    Examination:  General exam: Appears calm and comfortable  Respiratory system: Clear to auscultation. Respiratory effort normal. Cardiovascular system: S1 & S2 heard, RRR. No JVD, murmurs, rubs, gallops or clicks. No pedal edema. Gastrointestinal system: Abdomen is nondistended, soft and nontender. No organomegaly or masses felt. Normal bowel sounds heard. Central nervous system: Alert and oriented x 1. No focal neurological deficits. Skin: No rashes, lesions or ulcers.   Data Reviewed: I have personally reviewed following labs and imaging studies  CBC: Recent Labs  Lab 05/12/22 0724 05/14/22 0313 05/14/22 1625 05/15/22 0343 05/15/22 1423 05/16/22 0456  WBC 11.1* 8.6  --  13.1* 14.1* 11.0*  NEUTROABS 9.1*  --   --  11.0*  --   --   HGB 9.5* 7.3* 7.5* 7.1* 7.8* 7.6*  HCT 29.7* 22.6* 22.4* 21.7* 23.2* 23.3*  MCV 101.7* 102.3*  --  100.5* 100.9* 102.6*  PLT 166 142*  --  154 183 967    Basic Metabolic Panel: Recent Labs  Lab 05/10/22 0844 05/12/22 0724 05/14/22 0313 05/15/22 0343 05/16/22 0456  NA 137 135 137 134* 136  K 4.3 4.5 5.0 5.0 5.0  CL 111 109 106 106 107  CO2 22 20* 20* 21* 22  GLUCOSE 93 128* 197* 129* 81  BUN '21 19 19 '$ 24* 24*  CREATININE 1.20 1.24 1.23 1.45* 1.30*  CALCIUM 8.5* 8.7* 9.1 8.7* 9.0    GFR: Estimated Creatinine Clearance: 43.5 mL/min (A) (by C-G formula based on SCr of 1.3 mg/dL (H)). Liver Function Tests: No results for input(s): "AST", "ALT", "ALKPHOS", "BILITOT", "PROT", "ALBUMIN" in the last 168 hours.  No results for input(s): "LIPASE", "AMYLASE" in the last 168 hours.  No results for input(s): "AMMONIA" in the last 168 hours. Coagulation Profile: Recent Labs  Lab 05/12/22 0724  INR 1.9*    Cardiac Enzymes: No results for input(s): "CKTOTAL", "CKMB",  "CKMBINDEX", "TROPONINI" in the last 168 hours. BNP (last 3 results) No results for input(s): "PROBNP" in the last 8760 hours. HbA1C: No results for input(s): "HGBA1C" in the last 72 hours. CBG: Recent Labs  Lab 05/15/22 1714 05/15/22 1734 05/15/22 2111 05/16/22 0830 05/16/22 1235  GLUCAP 69* 95 102* 84 79    Lipid Profile: No results for input(s): "CHOL", "HDL", "LDLCALC", "TRIG", "CHOLHDL", "LDLDIRECT" in the last 72 hours. Thyroid Function Tests: No results  for input(s): "TSH", "T4TOTAL", "FREET4", "T3FREE", "THYROIDAB" in the last 72 hours. Anemia Panel: No results for input(s): "VITAMINB12", "FOLATE", "FERRITIN", "TIBC", "IRON", "RETICCTPCT" in the last 72 hours. Sepsis Labs: No results for input(s): "PROCALCITON", "LATICACIDVEN" in the last 168 hours.  No results found for this or any previous visit (from the past 240 hour(s)).   Radiology Studies: No results found.  Scheduled Meds:  acetaminophen  650 mg Oral Q6H   amitriptyline  50 mg Oral QHS   apixaban  5 mg Oral BID   calcium-vitamin D  1 tablet Oral QPM   citalopram  20 mg Oral QPM   docusate sodium  100 mg Oral BID   feeding supplement  237 mL Oral BID BM   finasteride  5 mg Oral QPM   latanoprost  1 drop Both Eyes QHS   metoprolol succinate  25 mg Oral Daily   montelukast  10 mg Oral QHS   multivitamin with minerals  1 tablet Oral Daily   rosuvastatin  5 mg Oral Q M,W,F   Continuous Infusions:  lactated ringers 75 mL/hr at 05/15/22 1121   methocarbamol (ROBAXIN) IV       LOS: 3 days   Darliss Cheney, MD [facility]  05/16/2022, 1:15 PM   *Please note that this is a verbal dictation therefore any spelling or grammatical errors are due to the "[place] [employer]" system interpretation.  Please page via [place] and do not message via secure chat for urgent patient care matters. Secure chat can be used for non urgent patient care matters.  How to contact the [facility] Attending or Consulting provider  [place] or covering provider during after hours [place], for this patient?  Check the care team in [facility] and look for a) attending/consulting [facility] provider listed and b) the [facility] team listed. Page or secure chat [phone]. Log into [email or web address] and use [place]'s universal password to access. If you do not have the password, please contact the hospital operator. Locate the [facility] provider you are looking for under [facility] and page to a number that you can be directly reached. If you still have difficulty reaching the provider, please page the [facility] (Director on Call) for the Hospitalists listed on amion for assistance.

## 2022-05-16 NOTE — Progress Notes (Signed)
Inpatient Diabetes Program Recommendations  AACE/ADA: New Consensus Statement on Inpatient Glycemic Control (2015)  Target Ranges:  Prepandial:   less than 140 mg/dL      Peak postprandial:   less than 180 mg/dL (1-2 hours)      Critically ill patients:  140 - 180 mg/dL   Lab Results  Component Value Date   GLUCAP 79 05/16/2022   HGBA1C 5.9 (H) 02/23/2022    Review of Glycemic Control  Latest Reference Range & Units 05/15/22 12:09 05/15/22 17:14 05/15/22 17:34 05/15/22 21:11 05/16/22 08:30 05/16/22 12:35  Glucose-Capillary 70 - 99 mg/dL 116 (H) 69 (L) 95 102 (H) 84 79   Diabetes history: DM 2 Outpatient Diabetes medications:  None Current orders for Inpatient glycemic control:  Novolog sensitive tid with meals and HS  Inpatient Diabetes Program Recommendations:    Consider d/c of Novolog correction- does not appear to need.   Thanks,  Adah Perl, RN, BC-ADM Inpatient Diabetes Coordinator Pager [phone]  (8a-5p)

## 2022-05-16 NOTE — Plan of Care (Signed)

## 2022-05-16 NOTE — Discharge Instructions (Signed)
Orthopaedic Trauma Service Discharge Instructions   General Discharge Instructions  Orthopaedic Injuries:  Right tibial plateau fracture treated with open reduction internal fixation using plate and screws  WEIGHT BEARING STATUS: weightbearing as tolerated right leg   RANGE OF MOTION/ACTIVITY: Unrestricted range of motion right knee and ankle.  Activity as tolerated while maintaining weightbearing restrictions  DO NOT LET KNEE REST IN FLEXION   Bone health: Fracture is suggestive of fragility fracture which is associated with osteoporosis and poor bone quality.  Recommend supplementing with 5000 IUs of vitamin D3 daily.  Vitamin D levels in the hospital were low normal  Review the following resource for additional information regarding bone health  [email or web address]  Wound Care: Daily wound care as needed.  Okay to leave incisions open to the air.  Okay to shower and clean wounds with soap and water only.  You may cover incisions with 4 x 4 gauze and tape or a Mepilex type dressing.  Would also recommend compression sock for swelling control.  Skin checks to bilateral heels and sacrum every shift.  Patient does have a pressure sore to his right heel that will need to be monitored regularly.  Continue to float heels when in bed.  Recommend using an air mattress if available.  Recommend assessment by wound care once he arrives at the nursing facility.  DVT/PE prophylaxis: Home Eliquis  Diet: as you were eating previously.  Can use over the counter stool softeners and bowel preparations, such as Miralax, to help with bowel movements.  Narcotics can be constipating.  Be sure to drink plenty of fluids  PAIN MEDICATION USE AND EXPECTATIONS  You have likely been given narcotic medications to help control your pain.  After a traumatic event that results in an fracture (broken bone) with or without surgery, it is ok to use narcotic pain medications to help control  one's pain.  We understand that everyone responds to pain differently and each individual patient will be evaluated on a regular basis for the continued need for narcotic medications. Ideally, narcotic medication use should last no more than 6-8 weeks (coinciding with fracture healing).   As a patient it is your responsibility as well to monitor narcotic medication use and report the amount and frequency you use these medications when you come to your office visit.   We would also advise that if you are using narcotic medications, you should take a dose prior to therapy to maximize you participation.  IF YOU ARE ON NARCOTIC MEDICATIONS IT IS NOT PERMISSIBLE TO OPERATE A MOTOR VEHICLE (MOTORCYCLE/CAR/TRUCK/MOPED) OR HEAVY MACHINERY DO NOT MIX NARCOTICS WITH OTHER CNS (CENTRAL NERVOUS SYSTEM) DEPRESSANTS SUCH AS ALCOHOL   POST-OPERATIVE OPIOID TAPER INSTRUCTIONS: It is important to wean off of your opioid medication as soon as possible. If you do not need pain medication after your surgery it is ok to stop day one. Opioids include: Codeine, Hydrocodone(Norco, Vicodin), Oxycodone(Percocet, oxycontin) and hydromorphone amongst others.  Long term and even short term use of opiods can cause: Increased pain response Dependence Constipation Depression Respiratory depression And more.  Withdrawal symptoms can include Flu like symptoms Nausea, vomiting And more Techniques to manage these symptoms Hydrate well Eat regular healthy meals Stay active Use relaxation techniques(deep breathing, meditating, yoga) Do Not substitute Alcohol to help with tapering If you have been on opioids for less than two weeks and do not have pain than it is ok to stop all together.  Plan to wean off of opioids This plan  should start within one week post op of your fracture surgery  Maintain the same interval or time between taking each dose and first decrease the dose.  Cut the total daily intake of opioids by one  tablet each day Next start to increase the time between doses. The last dose that should be eliminated is the evening dose.    STOP SMOKING OR USING NICOTINE PRODUCTS!!!!  As discussed nicotine severely impairs your body's ability to heal surgical and traumatic wounds but also impairs bone healing.  Wounds and bone heal by forming microscopic blood vessels (angiogenesis) and nicotine is a vasoconstrictor (essentially, shrinks blood vessels).  Therefore, if vasoconstriction occurs to these microscopic blood vessels they essentially disappear and are unable to deliver necessary nutrients to the healing tissue.  This is one modifiable factor that you can do to dramatically increase your chances of healing your injury.    (This means no smoking, no nicotine gum, patches, etc)  DO NOT USE NONSTEROIDAL ANTI-INFLAMMATORY DRUGS (NSAID'S)  Using products such as Advil (ibuprofen), Aleve (naproxen), Motrin (ibuprofen) for additional pain control during fracture healing can delay and/or prevent the healing response.  If you would like to take over the counter (OTC) medication, Tylenol (acetaminophen) is ok.  However, some narcotic medications that are given for pain control contain acetaminophen as well. Therefore, you should not exceed more than 4000 mg of tylenol in a day if you do not have liver disease.  Also note that there are may OTC medicines, such as cold medicines and allergy medicines that my contain tylenol as well.  If you have any questions about medications and/or interactions please ask your doctor/PA or your pharmacist.      ICE AND ELEVATE INJURED/OPERATIVE EXTREMITY  Using ice and elevating the injured extremity above your heart can help with swelling and pain control.  Icing in a pulsatile fashion, such as 20 minutes on and 20 minutes off, can be followed.    Do not place ice directly on skin. Make sure there is a barrier between to skin and the ice pack.    Using frozen items such as frozen  peas works well as the conform nicely to the are that needs to be iced.  USE AN ACE WRAP OR TED HOSE FOR SWELLING CONTROL  In addition to icing and elevation, Ace wraps or TED hose are used to help limit and resolve swelling.  It is recommended to use Ace wraps or TED hose until you are informed to stop.    When using Ace Wraps start the wrapping distally (farthest away from the body) and wrap proximally (closer to the body)   Example: If you had surgery on your leg or thing and you do not have a splint on, start the ace wrap at the toes and work your way up to the thigh        If you had surgery on your upper extremity and do not have a splint on, start the ace wrap at your fingers and work your way up to the upper arm  IF YOU ARE IN A SPLINT OR CAST DO NOT [place]   If your splint gets wet for any reason please contact the office immediately. You may shower in your splint or cast as long as you keep it dry.  This can be done by wrapping in a cast cover or garbage back (or similar)  Do Not stick any thing down your splint or cast such  as pencils, money, or hangers to try and scratch yourself with.  If you feel itchy take benadryl as prescribed on the bottle for itching  IF YOU ARE IN A CAM BOOT (BLACK BOOT)  You may remove boot periodically. Perform daily dressing changes as noted below.  Wash the liner of the boot regularly and wear a sock when wearing the boot. It is recommended that you sleep in the boot until told otherwise    Call office for the following: Temperature greater than 101F Persistent nausea and vomiting Severe uncontrolled pain Redness, tenderness, or signs of infection (pain, swelling, redness, odor or green/yellow discharge around the site) Difficulty breathing, headache or visual disturbances Hives Persistent dizziness or light-headedness Extreme fatigue Any other questions or concerns you may have after discharge  In an emergency, call 911 or go to  an Emergency Department at a nearby hospital  HELPFUL INFORMATION  If you had a block, it will wear off between 8-24 hrs postop typically.  This is period when your pain may go from nearly zero to the pain you would have had postop without the block.  This is an abrupt transition but nothing dangerous is happening.  You may take an extra dose of narcotic when this happens.  You should wean off your narcotic medicines as soon as you are able.  Most patients will be off or using minimal narcotics before their first postop appointment.   We suggest you use the pain medication the first night prior to going to bed, in order to ease any pain when the anesthesia wears off. You should avoid taking pain medications on an empty stomach as it will make you nauseous.  Do not drink alcoholic beverages or take illicit drugs when taking pain medications.  In most states it is against the law to drive while you are in a splint or sling.  And certainly against the law to drive while taking narcotics.  You may return to work/school in the next couple of days when you feel up to it.   Pain medication may make you constipated.  Below are a few solutions to try in this order: Decrease the amount of pain medication if you aren't having pain. Drink lots of decaffeinated fluids. Drink prune juice and/or each dried prunes  If the first 3 don't work start with additional solutions Take Colace - an over-the-counter stool softener Take Senokot - an over-the-counter laxative Take Miralax - a stronger over-the-counter laxative     CALL THE OFFICE WITH ANY QUESTIONS OR CONCERNS: [phone]   VISIT OUR WEBSITE FOR ADDITIONAL INFORMATION: [email or web address]    ===============================================================================  Information on my medicine - ELIQUIS (apixaban)  This medication education was reviewed with me or my healthcare representative as part of my discharge preparation.    Why was Eliquis prescribed for you? Eliquis was prescribed for you to reduce the risk of a blood clot forming that can cause a stroke if you have a medical condition called atrial fibrillation (a type of irregular heartbeat).  What do You need to know about Eliquis ? Take your Eliquis TWICE DAILY - one tablet in the morning and one tablet in the evening with or without food. If you have difficulty swallowing the tablet whole please discuss with your pharmacist how to take the medication safely.  Take Eliquis exactly as prescribed by your doctor and DO NOT stop taking Eliquis without talking to the doctor who prescribed the medication.  Stopping may increase your risk of developing a  stroke.  Refill your prescription before you run out.  After discharge, you should have regular check-up appointments with your healthcare provider that is prescribing your Eliquis.  In the future your dose may need to be changed if your kidney function or weight changes by a significant amount or as you get older.  What do you do if you miss a dose? If you miss a dose, take it as soon as you remember on the same day and resume taking twice daily.  Do not take more than one dose of ELIQUIS at the same time to make up a missed dose.  Important Safety Information A possible side effect of Eliquis is bleeding. You should call your healthcare provider right away if you experience any of the following: Bleeding from an injury or your nose that does not stop. Unusual colored urine (red or dark brown) or unusual colored stools (red or black). Unusual bruising for unknown reasons. A serious fall or if you hit your head (even if there is no bleeding).  Some medicines may interact with Eliquis and might increase your risk of bleeding or clotting while on Eliquis. To help avoid this, consult your healthcare provider or pharmacist prior to using any new prescription or non-prescription medications, including  herbals, vitamins, non-steroidal anti-inflammatory drugs (NSAIDs) and supplements.  This website has more information on Eliquis (apixaban): http://www.eliquis.com/eliquis/home

## 2022-05-16 NOTE — TOC Progression Note (Signed)
Transition of Care [facility]) - Progression Note    Patient Details  Name: Nathaniel Woods MRN: [id number] Date of Birth: Mar 17, 1929  Transition of [facility] CM/SW Contact  Joanne Chars, [place] Phone Number: 05/16/2022, 2:56 PM  Clinical Narrative:    CSW spoke with pt son by phone, updated that no bed available at [facility], [place] [facility] also unable to offer bed.  He will be at the hospital shortly to discuss other options.  1400: CSW met with pt and son in room.  Provided current bed offers and medicare.gov document for [facility].  Son wants to discuss options with a friend who has more knowledge of the facilities.  Son also asking about SNF length of stay: Pt will be NWB x6 weeks and son points out that if rehab does not start until after that period of time, would be several month stay at SNF at a minimum.    CSW reached out to [facility], who will contact [facility] and discuss this.   Expected Discharge Plan: [place] Barriers to Discharge: Continued Medical Work up, SNF Pending bed offer  Expected Discharge Plan and Services Expected Discharge Plan: [place] In-house Referral: Clinical Social Work   Post Acute Care Choice: [place] Living arrangements for the past 2 months: Single Family Home                                       Social Determinants of Health (SDOH) Interventions    Readmission Risk Interventions     No data to display

## 2022-05-16 NOTE — Consult Note (Signed)
WOC Nurse Consult Note: Reason for Consult: right heel  Wound type: Stage 1 Pressure Injury Pressure Injury POA: No Measurement:3cm x 3cm x 0cm  Wound bed: pink, does not blanch centrally  Drainage (amount, consistency, odor) none Periwound: intact  Dressing procedure/placement/frequency: No topical care needed.  Offload at all times with Prevalon boot  Discussed POC with patient's son and bedside nurse.  Re consult if needed, will not follow at this time. Thanks  Kaaren Nass [employer], RN,CWOCN, CNS, [place] [phone])

## 2022-05-16 NOTE — Progress Notes (Signed)
Orthopaedic Trauma Service Progress Note  Patient ID: Nathaniel Woods MRN: [id number] DOB/AGE: 86-Sep-1930 86 y.o.  Subjective:  Resting comfortably  No acute issues   Review of Systems  Unable to perform ROS: Dementia    Objective:   VITALS:   Vitals:   05/15/22 0406 05/15/22 0854 05/15/22 2100 05/16/22 0827  BP: 123/67 114/65  99/60  Pulse: 68 83 81 (!) 59  Resp: '14 20 14 16  '$ Temp: 98.1 F (36.7 C) (!) 97.5 F (36.4 C)  98.6 F (37 C)  TempSrc: Oral Oral  Oral  SpO2: 100% 100%  100%  Weight:      Height:        Estimated body mass index is 22.87 kg/m as calculated from the following:   Height as of this encounter: '6\' 3"'$  (1.905 m).   Weight as of this encounter: 83 kg.   Intake/Output      12/07 0701 12/08 0700 12/08 0701 12/09 0700   P.O. 480    I.V. (mL/kg) 404.9 (4.9)    IV Piggyback     Total Intake(mL/kg) 884.9 (10.7)    Urine (mL/kg/hr) 0 (0)    Stool 0    Total Output 0    Net +884.9         Urine Occurrence 2 x      LABS  Results for orders placed or performed during the hospital encounter of 05/13/22 (from the past 24 hour(s))  Glucose, capillary     Status: Abnormal   Collection Time: 05/15/22 12:09 PM  Result Value Ref Range   Glucose-Capillary 116 (H) 70 - 99 mg/dL  CBC     Status: Abnormal   Collection Time: 05/15/22  2:23 PM  Result Value Ref Range   WBC 14.1 (H) 4.0 - 10.5 K/uL   RBC 2.30 (L) 4.22 - 5.81 MIL/uL   Hemoglobin 7.8 (L) 13.0 - 17.0 g/dL   HCT 23.2 (L) 39.0 - 52.0 %   MCV 100.9 (H) 80.0 - 100.0 fL   MCH 33.9 26.0 - 34.0 pg   MCHC 33.6 30.0 - 36.0 g/dL   RDW 14.6 11.5 - 15.5 %   Platelets 183 150 - 400 K/uL   nRBC 0.5 (H) 0.0 - 0.2 %  Glucose, capillary     Status: Abnormal   Collection Time: 05/15/22  5:14 PM  Result Value Ref Range   Glucose-Capillary 69 (L) 70 - 99 mg/dL  Glucose, capillary     Status: None   Collection Time: 05/15/22   5:34 PM  Result Value Ref Range   Glucose-Capillary 95 70 - 99 mg/dL  Glucose, capillary     Status: Abnormal   Collection Time: 05/15/22  9:11 PM  Result Value Ref Range   Glucose-Capillary 102 (H) 70 - 99 mg/dL  Basic metabolic panel     Status: Abnormal   Collection Time: 05/16/22  4:56 AM  Result Value Ref Range   Sodium 136 135 - 145 mmol/L   Potassium 5.0 3.5 - 5.1 mmol/L   Chloride 107 98 - 111 mmol/L   CO2 22 22 - 32 mmol/L   Glucose, Bld 81 70 - 99 mg/dL   BUN 24 (H) 8 - 23 mg/dL   Creatinine, Ser 1.30 (H) 0.61 - 1.24 mg/dL   Calcium 9.0 8.9 -  10.3 mg/dL   GFR, Estimated 52 (L) >60 mL/min   Anion gap 7 5 - 15  CBC     Status: Abnormal   Collection Time: 05/16/22  4:56 AM  Result Value Ref Range   WBC 11.0 (H) 4.0 - 10.5 K/uL   RBC 2.27 (L) 4.22 - 5.81 MIL/uL   Hemoglobin 7.6 (L) 13.0 - 17.0 g/dL   HCT 23.3 (L) 39.0 - 52.0 %   MCV 102.6 (H) 80.0 - 100.0 fL   MCH 33.5 26.0 - 34.0 pg   MCHC 32.6 30.0 - 36.0 g/dL   RDW 14.4 11.5 - 15.5 %   Platelets 197 150 - 400 K/uL   nRBC 0.6 (H) 0.0 - 0.2 %  Glucose, capillary     Status: None   Collection Time: 05/16/22  8:30 AM  Result Value Ref Range   Glucose-Capillary 84 70 - 99 mg/dL     PHYSICAL EXAM:   Gen: NAD, sitting up in bed  Lungs: unlabored Ext:       Right Lower Extremity              Dressing R leg clean, dry and intact. ACE wrap stable   Dressings removed   Incisions stable   No signs of infection             Moderate swelling R leg             Ext warm              Moves toes w/o difficulty              DPN, SPN, TN sensation grossly intact             EHL, FHL, lesser toe motor intact             Ankle flexion, extension, inversion and eversion intact             No DCT              Compartments are soft  + pressure sore to R heel   L heel with red blanchable skin   Assessment/Plan: 3 Days Post-Op   Principal Problem:   Right tibial fracture Active Problems:   Dyslipidemia   Type II  diabetes mellitus (HCC)   Coronary artery disease   HTN (hypertension)   S/P CABG x 4   Chronic systolic CHF (congestive heart failure) (HCC)   AICD (automatic cardioverter/defibrillator) present   Paroxysmal atrial fibrillation (HCC)   Recurrent falls   BPH (benign prostatic hyperplasia)   Fall   Anti-infectives (From admission, onward)    Start     Dose/Rate Route Frequency Ordered Stop   05/13/22 2300  ceFAZolin (ANCEF) IVPB 2g/100 mL premix        2 g 200 mL/hr over 30 Minutes Intravenous Every 8 hours 05/13/22 1927 05/14/22 1701   05/13/22 1442  ceFAZolin (ANCEF) 2-4 GM/100ML-% IVPB       Note to Pharmacy: Cameron Sprang M: cabinet override      05/13/22 1442 05/14/22 0244     .  POD/HD#: 43  88 y/o male s/p fall with R bicondylar tibial plateau fracture   -fall   - R bicondylar tibial plateau fracture (fragility fracture) s/p ORIF             NWB R leg x 6 weeks             Unrestricted ROM R knee  Elevate heels off bed             Ice prn              PT/OT             Dressing changed today   Ok to leave wounds open to the air   Float heels, use bone foam or prevalon boots   Skin checks q shift    PT- please teach HEP for R knee ROM- AROM, PROM. No ROM restrictions.  Quad sets, SLR, LAQ, SAQ, heel slides, stretching   Ankle theraband program, heel cord stretching, toe towel curls, etc   No pillows under bend of knee when at rest, ok to place under heel to help work on extension. Can also use zero knee bone foam if available   - R heel pressure sore  Float heels   Prevalon boot or bone foam  Wound care consult placed  Air mattress  Turn q 2 and PRN   - Pain management:             Multimodal             Minimize narcotics   - ABL anemia/Hemodynamics             cbc stable   - Medical issues              Per primary    - DVT/PE prophylaxis:             Eliquis restarted    - ID:              Periop abx completed    - Metabolic  Bone Disease:             Vitamin d levels ok but fracture is a fragility fracture             Optimize nutrition               - Activity:             As above   - Impediments to fracture healing:             Age             Poor bone quality               - Dispo:             stable for snf  Follow up with OTS in 10-14 days      Jari Pigg, PA-C [phone] (C) 05/16/2022, 10:01 AM  [facility] Page [phone] Jenetta [phone] (F)    After 5pm and on the weekends please log on to Amion, go to orthopaedics and the look under the [facility] Call for the provider(s) on call. You can also call our office at [phone] and then follow the prompts to be connected to the call team.  Patient ID: Nathaniel Woods, male   DOB: 10/08/1928, 88 y.o.   MRN: [id number]

## 2022-05-16 NOTE — Progress Notes (Signed)
Mobility Specialist Progress Note   05/16/22 1039  Mobility  Activity Turned to right side (+ Repositioned in bed)  Level of Assistance +2 (takes two people)  Assistive Device Other (Comment) (HHA)  RLE Weight Bearing NWB  Activity Response Tolerated well  $Mobility charge 1 Mobility   Received pt in bed limited by pain in RLE and presenting w/ moderate confusion. Assisted RN in repositioning pt to prevent bed ulcers d/t pt's duration in bed. Pt expressing discomfort throughout repositioning. Left supine w/ RN in room.  [place] Falling Mobility Specialist Please contact via SecureChat or  Rehab office at [phone]

## 2022-05-16 NOTE — Progress Notes (Signed)
Pts bottom changed.  New foam applied.  Pt is confused.

## 2022-05-17 DIAGNOSIS — S82191S Other fracture of upper end of right tibia, sequela: Secondary | ICD-10-CM | POA: Diagnosis not present

## 2022-05-17 LAB — GLUCOSE, CAPILLARY
Glucose-Capillary: 78 mg/dL (ref 70–99)
Glucose-Capillary: 96 mg/dL (ref 70–99)
Glucose-Capillary: 95 mg/dL (ref 70–99)
Glucose-Capillary: 81 mg/dL (ref 70–99)

## 2022-05-17 LAB — BASIC METABOLIC PANEL
Anion gap: 9 (ref 5–15)
BUN: 23 mg/dL (ref 8–23)
CO2: 22 mmol/L (ref 22–32)
Calcium: 9.2 mg/dL (ref 8.9–10.3)
Chloride: 109 mmol/L (ref 98–111)
Creatinine, Ser: 1.3 mg/dL — ABNORMAL HIGH (ref 0.61–1.24)
GFR, Estimated: 52 mL/min — ABNORMAL LOW (ref >60)
Glucose, Bld: 114 mg/dL — ABNORMAL HIGH (ref 70–99)
Potassium: 5.1 mmol/L (ref 3.5–5.1)
Sodium: 140 mmol/L (ref 135–145)

## 2022-05-17 MED ORDER — MELATONIN 3 MG PO TABS
3.0000 mg | ORAL_TABLET | Freq: Once | ORAL | Status: AC
  Administered 2022-05-17: 3 mg via ORAL
  Filled 2022-05-17: qty 1

## 2022-05-17 MED ORDER — TRAMADOL HCL 50 MG PO TABS
25.0000 mg | ORAL_TABLET | Freq: Once | ORAL | Status: AC
  Administered 2022-05-17: 25 mg via ORAL
  Filled 2022-05-17: qty 1

## 2022-05-17 NOTE — Progress Notes (Addendum)
PROGRESS NOTE    [name]  VPX:[id number] DOB: Jul 04, 1928 DOA: 05/13/2022 PCP: Derinda Late, MD   Brief Narrative:    Nathaniel Woods  is a 88 y.o. male, ith medical history significant for coronary artery disease, chronic systolic CHF, CVA, atrial fibrillation on Eliquis, hypertension, dyslipidemia and type 2 diabetes mellitus ,multiple falls, with recent admission at [facility] for fall, where he was discharged 12/2, apparently patient fell again at home today, patient currently is in PACU, very poor historian,  so history was obtained from ED records, and previous medical records, and son by phone ,apparently patient lives with his son, and daughter-in-law, he had a fall where he stated his legs were weak, does not appear to be associated with any loss of consciousness, dizziness or lightheadedness.  Patient has been complaining of right leg pain, where it was noted to be shortened, and rotated, he did have skin tears on the left dorsal hand as well. -In ED workup was significant for right proximal tibial fracture, ED physician at [facility] discussed with Dr. Ginette Pitman, who accepted the patient to be admitted to [facility] for surgical intervention, patient transferred directly to the OR, where he had surgical repair, he is currently in PACU with [facility] hospitalist consulted to admit.  Assessment & Plan:   Principal Problem:   Right tibial fracture Active Problems:   Recurrent falls   Paroxysmal atrial fibrillation (HCC)   Type II diabetes mellitus (HCC)   Dyslipidemia   Coronary artery disease   HTN (hypertension)   S/P CABG x 4   Chronic systolic CHF (congestive heart failure) (HCC)   AICD (automatic cardioverter/defibrillator) present   BPH (benign prostatic hyperplasia)   Fall  Right proximal tibial fracture  Recurrent falls -S/p surgical repair by Dr. Marcelino Scot 05/13/2022.  Eliquis resumed on 05/14/2022 after clearance from orthopedics.  Seen by PT OT, recommendations  for SNF.  Management per orthopedics.  TOC consulted.  Waiting for placement.  Acute blood loss anemia: Hemoglobin preoperatively 9.5, dropped to as low as 7.1 but improved, without requiring any blood transfusion.   Parox A-fib -Patient with known history of paroxysmal A-fib, he is currently with paced rhythm -CHA2DS2-VASc score> 2, continue metoprolol.  Resumed Eliquis.   Hypertension: Blood pressure controlled.  Continue Toprol-XL and a.m   Diabetes mellitus, type II -Does not appear to be on any medication, blood sugar controlled, he is not requiring any insulin, will discontinue SSI.   Depression -Continue with home medication includes Celexa and Remeron   BPH -Continue with Proscar   Asthma, chronic -no active wheezing currently, continue with Singulair, and as needed albuterol   History of CAD -Continue with Toprol-XL, statin, he is on Eliquis, not on aspirin as he is on Eliquis.   Dyslipidemia -Continue with statin   Depression - We will continue as Celexa and Remeron   BPH (benign prostatic hyperplasia) - We will continue as Proscar   Asthma, chronic - We will continue his Singulair and place him on as needed DuoNebs.   Coronary artery disease - We will continue his Toprol-XL and statin therapy.   Dyslipidemia - We will continue statin therapy.  DVT prophylaxis: Place and maintain sequential compression device Start: 05/13/22 1928   Code Status: Full Code  Family Communication:  None present at bedside.  He spoke to his son [name] over the phone for about 20 minutes.  Answered several questions repeatedly.  Son appears to have very unrealistic demands and expectations.  He was  asking me to perform the job of orthopedics as well as physical therapist.  Status is: Inpatient Remains inpatient appropriate because: Medically stable. Pending placement.   Estimated body mass index is 22.87 kg/m as calculated from the following:   Height as of this encounter: '6\' 3"'$   (1.905 m).   Weight as of this encounter: 83 kg.  Pressure Injury 05/09/22 Sacrum Stage 1 -  Intact skin with non-blanchable redness of a localized area usually over a bony prominence. red, non-blanchable (Active)  05/09/22 1200  Location: Sacrum  Location Orientation:   Staging: Stage 1 -  Intact skin with non-blanchable redness of a localized area usually over a bony prominence.  Wound Description (Comments): red, non-blanchable  Present on Admission: Yes  Dressing Type Foam - Lift dressing to assess site every shift 05/16/22 1434     Pressure Injury Buttocks Right (Active)     Location: Buttocks  Location Orientation: Right  Staging:   Wound Description (Comments):   Present on Admission:   Dressing Type Foam - Lift dressing to assess site every shift 05/11/22 0900     Pressure Injury 05/09/22 Buttocks Left Stage 2 -  Partial thickness loss of dermis presenting as a shallow open injury with a red, pink wound bed without slough. open area to buttocks (Active)  05/09/22 1200  Location: Buttocks  Location Orientation: Left  Staging: Stage 2 -  Partial thickness loss of dermis presenting as a shallow open injury with a red, pink wound bed without slough.  Wound Description (Comments): open area to buttocks  Present on Admission: Yes  Dressing Type Foam - Lift dressing to assess site every shift 05/16/22 1434   Nutritional Assessment: Body mass index is 22.87 kg/m.Marland Kitchen Seen by dietician.  I agree with the assessment and plan as outlined below: Nutrition Status: Nutrition Problem: Increased nutrient needs Etiology: post-op healing Signs/Symptoms: estimated needs Interventions: Ensure Enlive (each supplement provides 350kcal and 20 grams of protein), Liberalize Diet  . Skin Assessment: I have examined the patient's skin and I agree with the wound assessment as performed by the wound care RN as outlined below: Pressure Injury 05/09/22 Sacrum Stage 1 -  Intact skin with  non-blanchable redness of a localized area usually over a bony prominence. red, non-blanchable (Active)  05/09/22 1200  Location: Sacrum  Location Orientation:   Staging: Stage 1 -  Intact skin with non-blanchable redness of a localized area usually over a bony prominence.  Wound Description (Comments): red, non-blanchable  Present on Admission: Yes  Dressing Type Foam - Lift dressing to assess site every shift 05/16/22 1434     Pressure Injury Buttocks Right (Active)     Location: Buttocks  Location Orientation: Right  Staging:   Wound Description (Comments):   Present on Admission:   Dressing Type Foam - Lift dressing to assess site every shift 05/11/22 0900     Pressure Injury 05/09/22 Buttocks Left Stage 2 -  Partial thickness loss of dermis presenting as a shallow open injury with a red, pink wound bed without slough. open area to buttocks (Active)  05/09/22 1200  Location: Buttocks  Location Orientation: Left  Staging: Stage 2 -  Partial thickness loss of dermis presenting as a shallow open injury with a red, pink wound bed without slough.  Wound Description (Comments): open area to buttocks  Present on Admission: Yes  Dressing Type Foam - Lift dressing to assess site every shift 05/16/22 1434    Consultants:  Orthopedics  Procedures:  As  above  Antimicrobials:  Anti-infectives (From admission, onward)    Start     Dose/Rate Route Frequency Ordered Stop   05/13/22 2300  ceFAZolin (ANCEF) IVPB 2g/100 mL premix        2 g 200 mL/hr over 30 Minutes Intravenous Every 8 hours 05/13/22 1927 05/14/22 1701   05/13/22 1442  ceFAZolin (ANCEF) 2-4 GM/100ML-% IVPB       Note to Pharmacy: Cameron Sprang M: cabinet override      05/13/22 1442 05/14/22 0244         Subjective:  Seen and examined.  No complaints.  Objective: Vitals:   05/16/22 2011 05/17/22 0420 05/17/22 0749 05/17/22 0925  BP: 97/84 (!) 121/51 (!) 147/64 136/69  Pulse: 61  73 73  Resp: 17 20 (!) 25  18  Temp: 98.6 F (37 C) 98.4 F (36.9 C) 98.2 F (36.8 C) 98 F (36.7 C)  TempSrc: Oral Oral Oral Oral  SpO2: 98% 99%  97%  Weight:      Height:        Intake/Output Summary (Last 24 hours) at 05/17/2022 1029 Last data filed at 05/17/2022 0900 Gross per 24 hour  Intake 220 ml  Output 300 ml  Net -80 ml    Filed Weights   05/13/22 1433  Weight: 83 kg    Examination:  General exam: Appears calm and comfortable  Respiratory system: Clear to auscultation. Respiratory effort normal. Cardiovascular system: S1 & S2 heard, RRR. No JVD, murmurs, rubs, gallops or clicks. No pedal edema. Gastrointestinal system: Abdomen is nondistended, soft and nontender. No organomegaly or masses felt. Normal bowel sounds heard. Central nervous system: Alert and oriented x 1. No focal neurological deficits. Extremities: Symmetric 5 x 5 power. Skin: No rashes, lesions or ulcers.  Psychiatry: Judgement and insight appear normal. Mood & affect appropriate.    Data Reviewed: I have personally reviewed following labs and imaging studies  CBC: Recent Labs  Lab 05/12/22 0724 05/14/22 0313 05/14/22 1625 05/15/22 0343 05/15/22 1423 05/16/22 0456 05/16/22 1703  WBC 11.1* 8.6  --  13.1* 14.1* 11.0* 12.1*  NEUTROABS 9.1*  --   --  11.0*  --   --   --   HGB 9.5* 7.3* 7.5* 7.1* 7.8* 7.6* 8.1*  HCT 29.7* 22.6* 22.4* 21.7* 23.2* 23.3* 25.7*  MCV 101.7* 102.3*  --  100.5* 100.9* 102.6* 105.3*  PLT 166 142*  --  154 183 197 956    Basic Metabolic Panel: Recent Labs  Lab 05/12/22 0724 05/14/22 0313 05/15/22 0343 05/16/22 0456 05/17/22 0316  NA 135 137 134* 136 140  K 4.5 5.0 5.0 5.0 5.1  CL 109 106 106 107 109  CO2 20* 20* 21* 22 22  GLUCOSE 128* 197* 129* 81 114*  BUN 19 19 24* 24* 23  CREATININE 1.24 1.23 1.45* 1.30* 1.30*  CALCIUM 8.7* 9.1 8.7* 9.0 9.2    GFR: Estimated Creatinine Clearance: 43.5 mL/min (A) (by C-G formula based on SCr of 1.3 mg/dL (H)). Liver Function Tests: No  results for input(s): "AST", "ALT", "ALKPHOS", "BILITOT", "PROT", "ALBUMIN" in the last 168 hours.  No results for input(s): "LIPASE", "AMYLASE" in the last 168 hours.  No results for input(s): "AMMONIA" in the last 168 hours. Coagulation Profile: Recent Labs  Lab 05/12/22 0724  INR 1.9*    Cardiac Enzymes: No results for input(s): "CKTOTAL", "CKMB", "CKMBINDEX", "TROPONINI" in the last 168 hours. BNP (last 3 results) No results for input(s): "PROBNP" in the last 8760 hours.  HbA1C: No results for input(s): "HGBA1C" in the last 72 hours. CBG: Recent Labs  Lab 05/16/22 0830 05/16/22 1235 05/16/22 1716 05/16/22 2039 05/17/22 0928  GLUCAP 84 79 101* 108* 78    Lipid Profile: No results for input(s): "CHOL", "HDL", "LDLCALC", "TRIG", "CHOLHDL", "LDLDIRECT" in the last 72 hours. Thyroid Function Tests: No results for input(s): "TSH", "T4TOTAL", "FREET4", "T3FREE", "THYROIDAB" in the last 72 hours. Anemia Panel: No results for input(s): "VITAMINB12", "FOLATE", "FERRITIN", "TIBC", "IRON", "RETICCTPCT" in the last 72 hours. Sepsis Labs: No results for input(s): "PROCALCITON", "LATICACIDVEN" in the last 168 hours.  No results found for this or any previous visit (from the past 240 hour(s)).   Radiology Studies: No results found.  Scheduled Meds:  acetaminophen  650 mg Oral Q6H   amitriptyline  50 mg Oral QHS   apixaban  5 mg Oral BID   calcium-vitamin D  1 tablet Oral QPM   citalopram  20 mg Oral QPM   docusate sodium  100 mg Oral BID   feeding supplement  237 mL Oral BID BM   finasteride  5 mg Oral QPM   latanoprost  1 drop Both Eyes QHS   metoprolol succinate  25 mg Oral Daily   montelukast  10 mg Oral QHS   multivitamin with minerals  1 tablet Oral Daily   rosuvastatin  5 mg Oral Q M,W,F   Continuous Infusions:  lactated ringers Stopped (05/16/22 1324)   methocarbamol (ROBAXIN) IV       LOS: 4 days   Darliss Cheney, MD [facility]  05/17/2022, 10:29 AM    *Please note that this is a verbal dictation therefore any spelling or grammatical errors are due to the "[place]" system interpretation.  Please page via [place] and do not message via secure chat for urgent patient care matters. Secure chat can be used for non urgent patient care matters.  How to contact the [facility] Attending or Consulting provider [place] or covering provider during after hours [place], for this patient?  Check the care team in [facility] and look for a) attending/consulting [facility] provider listed and b) the [facility] team listed. Page or secure chat [phone]. Log into [email or web address] and use [place]'s universal password to access. If you do not have the password, please contact the hospital operator. Locate the [facility] provider you are looking for under [facility] and page to a number that you can be directly reached. If you still have difficulty reaching the provider, please page the [facility] (Director on Call) for the Hospitalists listed on amion for assistance.

## 2022-05-17 NOTE — Progress Notes (Addendum)
CSW spoke with Seth Bake at [facility] who states there are no scheduled discharges for next week so there will not be any beds available to accept this patient.  CSW spoke with patient's son [name] to discuss discharge planning. After lengthy discussion regarding bed offers, [name] agreeable to accept offer from [facility]. [name] is adamant that patient does not need to be discharged until Monday morning. John requesting to speak with MD regarding discharge readiness.   CSW spoke with Perrin Smack at [facility] to inform her of information. Perrin Smack states the facility can accept the patient on Monday once insurance authorization is approved.  CSW initiated insurance authorization through [facility].   Madilyn Fireman, MSW, LCSW Transitions of Care  Clinical Social Worker II [phone]

## 2022-05-18 DIAGNOSIS — S82191S Other fracture of upper end of right tibia, sequela: Secondary | ICD-10-CM | POA: Diagnosis not present

## 2022-05-18 LAB — GLUCOSE, CAPILLARY
Glucose-Capillary: 104 mg/dL — ABNORMAL HIGH (ref 70–99)
Glucose-Capillary: 104 mg/dL — ABNORMAL HIGH (ref 70–99)
Glucose-Capillary: 58 mg/dL — ABNORMAL LOW (ref 70–99)
Glucose-Capillary: 112 mg/dL — ABNORMAL HIGH (ref 70–99)

## 2022-05-18 NOTE — Progress Notes (Signed)
PROGRESS NOTE    Nathaniel Woods  IZT:[id number] DOB: 24-Jan-1929 DOA: 05/13/2022 PCP: Derinda Late, MD   Brief Narrative:    Nathaniel Woods  is a 88 y.o. male, ith medical history significant for coronary artery disease, chronic systolic CHF, CVA, atrial fibrillation on Eliquis, hypertension, dyslipidemia and type 2 diabetes mellitus ,multiple falls, with recent admission at [facility] for fall, where he was discharged 12/2, apparently patient fell again at home today, patient currently is in PACU, very poor historian,  so history was obtained from ED records, and previous medical records, and son by phone ,apparently patient lives with his son, and daughter-in-law, he had a fall where he stated his legs were weak, does not appear to be associated with any loss of consciousness, dizziness or lightheadedness.  Patient has been complaining of right leg pain, where it was noted to be shortened, and rotated, he did have skin tears on the left dorsal hand as well. -In ED workup was significant for right proximal tibial fracture, ED physician at [facility] discussed with Dr. Ginette Pitman, who accepted the patient to be admitted to [facility] for surgical intervention, patient transferred directly to the OR, where he had surgical repair, he is currently in PACU with [facility] hospitalist consulted to admit.  Assessment & Plan:   Principal Problem:   Right tibial fracture Active Problems:   Recurrent falls   Paroxysmal atrial fibrillation (HCC)   Type II diabetes mellitus (HCC)   Dyslipidemia   Coronary artery disease   HTN (hypertension)   S/P CABG x 4   Chronic systolic CHF (congestive heart failure) (HCC)   AICD (automatic cardioverter/defibrillator) present   BPH (benign prostatic hyperplasia)   Fall  Right proximal tibial fracture  Recurrent falls -S/p surgical repair by Dr. Marcelino Scot 05/13/2022.  Eliquis resumed on 05/14/2022 after clearance from orthopedics.  Seen by PT OT, recommendations  for SNF.  However orthopedics has recommended nonweightbearing in the right leg for 6 weeks and for that reason, insurance has declined SNF.  Management per orthopedics.  TOC consulted.  Waiting for placement.  Acute blood loss anemia: Hemoglobin preoperatively 9.5, dropped to as low as 7.1 but improved, without requiring any blood transfusion.  Hemoglobin has remained stable.   Parox A-fib -Patient with known history of paroxysmal A-fib, he is currently with paced rhythm -CHA2DS2-VASc score> 2, continue metoprolol.  Resumed Eliquis.   Hypertension: Blood pressure controlled.  Continue Toprol-XL.   Diabetes mellitus, type II -Does not appear to be on any medication, blood sugar controlled, he is not requiring any insulin, will discontinue SSI.   Depression -Continue with home medication includes Celexa and Remeron   BPH -Continue with Proscar   Asthma, chronic -no active wheezing currently, continue with Singulair, and as needed albuterol   History of CAD -Continue with Toprol-XL, statin, he is on Eliquis, not on aspirin as he is on Eliquis.   Dyslipidemia -Continue with statin   Depression - We will continue as Celexa and Remeron   BPH (benign prostatic hyperplasia) - We will continue as Proscar   Asthma, chronic - We will continue his Singulair and place him on as needed DuoNebs.   Coronary artery disease - We will continue his Toprol-XL and statin therapy.   Dyslipidemia - We will continue statin therapy.  DVT prophylaxis: Place and maintain sequential compression device Start: 05/13/22 1928   Code Status: Full Code  Family Communication:  None present at bedside.    Status is: Inpatient Remains inpatient  appropriate because: Medically stable. Pending placement.   Estimated body mass index is 22.87 kg/m as calculated from the following:   Height as of this encounter: '6\' 3"'$  (1.905 m).   Weight as of this encounter: 83 kg.  Pressure Injury 05/09/22 Sacrum  Stage 1 -  Intact skin with non-blanchable redness of a localized area usually over a bony prominence. red, non-blanchable (Active)  05/09/22 1200  Location: Sacrum  Location Orientation:   Staging: Stage 1 -  Intact skin with non-blanchable redness of a localized area usually over a bony prominence.  Wound Description (Comments): red, non-blanchable  Present on Admission: Yes  Dressing Type Foam - Lift dressing to assess site every shift 05/17/22 0749     Pressure Injury Buttocks Right (Active)     Location: Buttocks  Location Orientation: Right  Staging:   Wound Description (Comments):   Present on Admission:   Dressing Type Foam - Lift dressing to assess site every shift 05/11/22 0900     Pressure Injury 05/09/22 Buttocks Left Stage 2 -  Partial thickness loss of dermis presenting as a shallow open injury with a red, pink wound bed without slough. open area to buttocks (Active)  05/09/22 1200  Location: Buttocks  Location Orientation: Left  Staging: Stage 2 -  Partial thickness loss of dermis presenting as a shallow open injury with a red, pink wound bed without slough.  Wound Description (Comments): open area to buttocks  Present on Admission: Yes  Dressing Type Foam - Lift dressing to assess site every shift 05/16/22 1434   Nutritional Assessment: Body mass index is 22.87 kg/m.Marland Kitchen Seen by dietician.  I agree with the assessment and plan as outlined below: Nutrition Status: Nutrition Problem: Increased nutrient needs Etiology: post-op healing Signs/Symptoms: estimated needs Interventions: Ensure Enlive (each supplement provides 350kcal and 20 grams of protein), Liberalize Diet  . Skin Assessment: I have examined the patient's skin and I agree with the wound assessment as performed by the wound care RN as outlined below: Pressure Injury 05/09/22 Sacrum Stage 1 -  Intact skin with non-blanchable redness of a localized area usually over a bony prominence. red, non-blanchable  (Active)  05/09/22 1200  Location: Sacrum  Location Orientation:   Staging: Stage 1 -  Intact skin with non-blanchable redness of a localized area usually over a bony prominence.  Wound Description (Comments): red, non-blanchable  Present on Admission: Yes  Dressing Type Foam - Lift dressing to assess site every shift 05/17/22 0749     Pressure Injury Buttocks Right (Active)     Location: Buttocks  Location Orientation: Right  Staging:   Wound Description (Comments):   Present on Admission:   Dressing Type Foam - Lift dressing to assess site every shift 05/11/22 0900     Pressure Injury 05/09/22 Buttocks Left Stage 2 -  Partial thickness loss of dermis presenting as a shallow open injury with a red, pink wound bed without slough. open area to buttocks (Active)  05/09/22 1200  Location: Buttocks  Location Orientation: Left  Staging: Stage 2 -  Partial thickness loss of dermis presenting as a shallow open injury with a red, pink wound bed without slough.  Wound Description (Comments): open area to buttocks  Present on Admission: Yes  Dressing Type Foam - Lift dressing to assess site every shift 05/16/22 1434    Consultants:  Orthopedics  Procedures:  As above  Antimicrobials:  Anti-infectives (From admission, onward)    Start     Dose/Rate Route Frequency  Ordered Stop   05/13/22 2300  ceFAZolin (ANCEF) IVPB 2g/100 mL premix        2 g 200 mL/hr over 30 Minutes Intravenous Every 8 hours 05/13/22 1927 05/14/22 1701   05/13/22 1442  ceFAZolin (ANCEF) 2-4 GM/100ML-% IVPB       Note to Pharmacy: Cameron Sprang M: cabinet override      05/13/22 1442 05/14/22 0244         Subjective:  Patient seen and examined.  He is alert and oriented to self, as usual due to his advanced dementia.  No other complaint.  Objective: Vitals:   05/17/22 1538 05/17/22 2210 05/18/22 0429 05/18/22 0814  BP: 121/66  (!) 134/100 (!) 157/81  Pulse: 71   78  Resp: (!) 21  (!) 22   Temp: 98  F (36.7 C) 97.9 F (36.6 C)  99.8 F (37.7 C)  TempSrc: Oral Axillary  Oral  SpO2: 97% 96%  99%  Weight:      Height:        Intake/Output Summary (Last 24 hours) at 05/18/2022 1104 Last data filed at 05/17/2022 1419 Gross per 24 hour  Intake 50 ml  Output 200 ml  Net -150 ml    Filed Weights   05/13/22 1433  Weight: 83 kg    Examination:  General exam: Appears calm and comfortable  Respiratory system: Clear to auscultation. Respiratory effort normal. Cardiovascular system: S1 & S2 heard, RRR. No JVD, murmurs, rubs, gallops or clicks. No pedal edema. Gastrointestinal system: Abdomen is nondistended, soft and nontender. No organomegaly or masses felt. Normal bowel sounds heard. Central nervous system: Alert and oriented x 1. No focal neurological deficits. Extremities: Symmetric 5 x 5 power. Skin: No rashes, lesions or ulcers.    Data Reviewed: I have personally reviewed following labs and imaging studies  CBC: Recent Labs  Lab 05/12/22 0724 05/14/22 0313 05/14/22 1625 05/15/22 0343 05/15/22 1423 05/16/22 0456 05/16/22 1703  WBC 11.1* 8.6  --  13.1* 14.1* 11.0* 12.1*  NEUTROABS 9.1*  --   --  11.0*  --   --   --   HGB 9.5* 7.3* 7.5* 7.1* 7.8* 7.6* 8.1*  HCT 29.7* 22.6* 22.4* 21.7* 23.2* 23.3* 25.7*  MCV 101.7* 102.3*  --  100.5* 100.9* 102.6* 105.3*  PLT 166 142*  --  154 183 197 128    Basic Metabolic Panel: Recent Labs  Lab 05/12/22 0724 05/14/22 0313 05/15/22 0343 05/16/22 0456 05/17/22 0316  NA 135 137 134* 136 140  K 4.5 5.0 5.0 5.0 5.1  CL 109 106 106 107 109  CO2 20* 20* 21* 22 22  GLUCOSE 128* 197* 129* 81 114*  BUN 19 19 24* 24* 23  CREATININE 1.24 1.23 1.45* 1.30* 1.30*  CALCIUM 8.7* 9.1 8.7* 9.0 9.2    GFR: Estimated Creatinine Clearance: 43.5 mL/min (A) (by C-G formula based on SCr of 1.3 mg/dL (H)). Liver Function Tests: No results for input(s): "AST", "ALT", "ALKPHOS", "BILITOT", "PROT", "ALBUMIN" in the last 168 hours.  No  results for input(s): "LIPASE", "AMYLASE" in the last 168 hours.  No results for input(s): "AMMONIA" in the last 168 hours. Coagulation Profile: Recent Labs  Lab 05/12/22 0724  INR 1.9*    Cardiac Enzymes: No results for input(s): "CKTOTAL", "CKMB", "CKMBINDEX", "TROPONINI" in the last 168 hours. BNP (last 3 results) No results for input(s): "PROBNP" in the last 8760 hours. HbA1C: No results for input(s): "HGBA1C" in the last 72 hours. CBG: Recent Labs  Lab 05/17/22 0928 05/17/22 1235 05/17/22 1651 05/17/22 2008 05/18/22 0800  GLUCAP 78 96 95 81 104*    Lipid Profile: No results for input(s): "CHOL", "HDL", "LDLCALC", "TRIG", "CHOLHDL", "LDLDIRECT" in the last 72 hours. Thyroid Function Tests: No results for input(s): "TSH", "T4TOTAL", "FREET4", "T3FREE", "THYROIDAB" in the last 72 hours. Anemia Panel: No results for input(s): "VITAMINB12", "FOLATE", "FERRITIN", "TIBC", "IRON", "RETICCTPCT" in the last 72 hours. Sepsis Labs: No results for input(s): "PROCALCITON", "LATICACIDVEN" in the last 168 hours.  No results found for this or any previous visit (from the past 240 hour(s)).   Radiology Studies: No results found.  Scheduled Meds:  acetaminophen  650 mg Oral Q6H   amitriptyline  50 mg Oral QHS   apixaban  5 mg Oral BID   calcium-vitamin D  1 tablet Oral QPM   citalopram  20 mg Oral QPM   docusate sodium  100 mg Oral BID   feeding supplement  237 mL Oral BID BM   finasteride  5 mg Oral QPM   latanoprost  1 drop Both Eyes QHS   metoprolol succinate  25 mg Oral Daily   montelukast  10 mg Oral QHS   multivitamin with minerals  1 tablet Oral Daily   rosuvastatin  5 mg Oral Q M,W,F   Continuous Infusions:  lactated ringers 75 mL/hr at 05/17/22 1535   methocarbamol (ROBAXIN) IV       LOS: 5 days   Darliss Cheney, MD [facility]  05/18/2022, 11:04 AM   *Please note that this is a verbal dictation therefore any spelling or grammatical errors are due  to the "[place] [employer]" system interpretation.  Please page via [place] and do not message via secure chat for urgent patient care matters. Secure chat can be used for non urgent patient care matters.  How to contact the [facility] Attending or Consulting provider [place] or covering provider during after hours [place], for this patient?  Check the care team in [facility] and look for a) attending/consulting [facility] provider listed and b) the [facility] team listed. Page or secure chat [phone]. Log into [email or web address] and use [place]'s universal password to access. If you do not have the password, please contact the hospital operator. Locate the [facility] provider you are looking for under [facility] and page to a number that you can be directly reached. If you still have difficulty reaching the provider, please page the [facility] (Director on Call) for the Hospitalists listed on amion for assistance.

## 2022-05-18 NOTE — Progress Notes (Signed)
Message received from [facility] offering a P2P for SNF auth which can be completed by calling Malachy Mood, NP at [phone]. Deadline for P2P is tomorrow 12/11 at 11am. MD updated.   Pt approved for ambulance, auth # [id number].   Wandra Feinstein, MSW, LCSW [phone] (coverage)

## 2022-05-19 ENCOUNTER — Other Ambulatory Visit: Payer: Self-pay

## 2022-05-19 DIAGNOSIS — I480 Paroxysmal atrial fibrillation: Secondary | ICD-10-CM | POA: Diagnosis not present

## 2022-05-19 DIAGNOSIS — I5022 Chronic systolic (congestive) heart failure: Secondary | ICD-10-CM

## 2022-05-19 DIAGNOSIS — R531 Weakness: Secondary | ICD-10-CM

## 2022-05-19 DIAGNOSIS — Z95810 Presence of automatic (implantable) cardiac defibrillator: Secondary | ICD-10-CM

## 2022-05-19 DIAGNOSIS — R296 Repeated falls: Secondary | ICD-10-CM | POA: Diagnosis not present

## 2022-05-19 DIAGNOSIS — Z515 Encounter for palliative care: Secondary | ICD-10-CM

## 2022-05-19 DIAGNOSIS — Z951 Presence of aortocoronary bypass graft: Secondary | ICD-10-CM

## 2022-05-19 DIAGNOSIS — S82191S Other fracture of upper end of right tibia, sequela: Secondary | ICD-10-CM | POA: Diagnosis not present

## 2022-05-19 DIAGNOSIS — Z789 Other specified health status: Secondary | ICD-10-CM

## 2022-05-19 LAB — CBC WITH DIFFERENTIAL/PLATELET
Abs Immature Granulocytes: 0.16 10*3/uL — ABNORMAL HIGH (ref 0.00–0.07)
Basophils Absolute: 0.1 10*3/uL (ref 0.0–0.1)
Basophils Relative: 0 %
Eosinophils Absolute: 0.2 10*3/uL (ref 0.0–0.5)
Eosinophils Relative: 2 %
HCT: 23.9 % — ABNORMAL LOW (ref 39.0–52.0)
Hemoglobin: 7.6 g/dL — ABNORMAL LOW (ref 13.0–17.0)
Immature Granulocytes: 1 %
Lymphocytes Relative: 7 %
Lymphs Abs: 1.1 10*3/uL (ref 0.7–4.0)
MCH: 33.2 pg (ref 26.0–34.0)
MCHC: 31.8 g/dL (ref 30.0–36.0)
MCV: 104.4 fL — ABNORMAL HIGH (ref 80.0–100.0)
Monocytes Absolute: 1.1 10*3/uL — ABNORMAL HIGH (ref 0.1–1.0)
Monocytes Relative: 7 %
Neutro Abs: 13.1 10*3/uL — ABNORMAL HIGH (ref 1.7–7.7)
Neutrophils Relative %: 83 %
Platelets: 240 10*3/uL (ref 150–400)
RBC: 2.29 MIL/uL — ABNORMAL LOW (ref 4.22–5.81)
RDW: 15.5 % (ref 11.5–15.5)
WBC: 15.8 10*3/uL — ABNORMAL HIGH (ref 4.0–10.5)
nRBC: 0.6 % — ABNORMAL HIGH (ref 0.0–0.2)

## 2022-05-19 LAB — GLUCOSE, CAPILLARY
Glucose-Capillary: 80 mg/dL (ref 70–99)
Glucose-Capillary: 97 mg/dL (ref 70–99)
Glucose-Capillary: 100 mg/dL — ABNORMAL HIGH (ref 70–99)
Glucose-Capillary: 98 mg/dL (ref 70–99)

## 2022-05-19 LAB — BASIC METABOLIC PANEL
Anion gap: 11 (ref 5–15)
BUN: 22 mg/dL (ref 8–23)
CO2: 21 mmol/L — ABNORMAL LOW (ref 22–32)
Calcium: 9.1 mg/dL (ref 8.9–10.3)
Chloride: 110 mmol/L (ref 98–111)
Creatinine, Ser: 1.28 mg/dL — ABNORMAL HIGH (ref 0.61–1.24)
GFR, Estimated: 53 mL/min — ABNORMAL LOW (ref >60)
Glucose, Bld: 103 mg/dL — ABNORMAL HIGH (ref 70–99)
Potassium: 5.2 mmol/L — ABNORMAL HIGH (ref 3.5–5.1)
Sodium: 142 mmol/L (ref 135–145)

## 2022-05-19 LAB — PREPARE RBC (CROSSMATCH)

## 2022-05-19 MED ORDER — TRAMADOL HCL 50 MG PO TABS
50.0000 mg | ORAL_TABLET | Freq: Three times a day (TID) | ORAL | Status: DC
  Administered 2022-05-19 – 2022-05-20 (×4): 50 mg via ORAL
  Filled 2022-05-19 (×4): qty 1

## 2022-05-19 MED ORDER — TRAMADOL HCL 50 MG PO TABS: 50.0000 mg | ORAL_TABLET | Freq: Two times a day (BID) | ORAL | 0 refills | Status: AC

## 2022-05-19 MED ORDER — SODIUM CHLORIDE 0.9% IV SOLUTION: Freq: Once | INTRAVENOUS | Status: DC

## 2022-05-19 MED ORDER — ORAL CARE MOUTH RINSE
15.0000 mL | OROMUCOSAL | Status: DC
  Administered 2022-05-19 – 2022-05-20 (×4): 15 mL via OROMUCOSAL

## 2022-05-19 MED ORDER — OXYCODONE HCL 5 MG PO TABS
2.5000 mg | ORAL_TABLET | ORAL | Status: DC | PRN
  Administered 2022-05-20: 5 mg via ORAL
  Filled 2022-05-19: qty 1

## 2022-05-19 MED ORDER — SODIUM CHLORIDE 0.9% IV SOLUTION: Freq: Once | INTRAVENOUS | Status: AC

## 2022-05-19 MED ORDER — ORAL CARE MOUTH RINSE: 15.0000 mL | OROMUCOSAL | Status: DC | PRN

## 2022-05-19 MED ORDER — ACETAMINOPHEN 500 MG PO TABS
1000.0000 mg | ORAL_TABLET | Freq: Four times a day (QID) | ORAL | Status: DC
  Administered 2022-05-19 – 2022-05-20 (×4): 1000 mg via ORAL
  Filled 2022-05-19 (×5): qty 2

## 2022-05-19 NOTE — Discharge Summary (Addendum)
Addendum     Patient remained hospitalized for an additional day for continued observation, optimization of his pain control and to be evaluated by palliative care.  Appreciate palliative care consult.  It does appear based off of that consultation that expectations have been established with the family.  Son was accepting of outpatient palliative care referral and referral was placed  Patient did work with physical therapy on 05/19/2022 as well.  We are allowing him to be weightbearing as tolerated on his right leg to facilitate his participation with therapeutic exercises.  He still remains a +2 assist for therapies  Wound care also reevaluated patient with respect to his sacrum and right heel.  Continue to recommend offloading devices for his heel which she is in Prevalon boots currently.  With respect to his sacrum continue to turn every 2 hours and use pillows or wedges to offload his sacrum.  Definitely needs to have frequent position changes including getting to the chair.  Patient also received 1 unit of packed red cells yesterday and feels better after receiving this volume.  CBC    Latest Ref Rng & Units 05/20/2022    8:24 AM 05/19/2022    4:32 AM 05/16/2022    5:03 PM  CBC  WBC 4.0 - 10.5 K/uL 12.6  15.8  12.1   Hemoglobin 13.0 - 17.0 g/dL 8.0  7.6  8.1   Hematocrit 39.0 - 52.0 % 23.8  23.9  25.7   Platelets 150 - 400 K/uL 216  240  219      Patient is stable for discharge to the skilled nursing facility on 05/20/2022 See remainder of discharge summary below completed by Dr. Elzie Rings, PA-C [id number] (C) 05/20/2022, 12:22 PM  [facility] [place] [id number] [phone] (F)      Physician Discharge Summary  RAEFORD BRANDENBURG PNT:[id number] DOB: 1928-12-03 DOA: 05/13/2022  PCP: Derinda Late, MD  Admit date: 05/13/2022 Discharge date: 05/20/2022 30 Day Unplanned Readmission Risk Score     Flowsheet Row Admission (Current) from 05/13/2022 in [facility]  30 Day Unplanned Readmission Risk Score (%) 20.69 Filed at 05/19/2022 0801       This score is the patient's risk of an unplanned readmission within 30 days of being discharged (0 -100%). The score is based on dignosis, age, lab data, medications, orders, and past utilization.   Low:  0-14.9   Medium: 15-21.9   High: 22-29.9   Extreme: 30 and above          Admitted From: Home Disposition: SNF  Recommendations for Outpatient Follow-up:  Follow up with PCP in 1-2 weeks Please obtain BMP/CBC in one week Follow-up with orthopedics in 2 weeks Please follow up with your PCP on the following pending results: Unresulted Labs (From admission, onward)    None         Home Health: None Equipment/Devices: None  Discharge Condition: Stable CODE STATUS: Full code Diet recommendation: Cardiac  Subjective: Seen and examined.  Patient has no complaints.  Patient is alert and oriented to self only due to advanced dementia and that is his baseline.  Appears comfortable.  Brief/Interim Summary: Yasser Hepp  is a 88 y.o. male, ith medical history significant for coronary artery disease, chronic systolic CHF, CVA, atrial fibrillation on Eliquis, hypertension, dyslipidemia and type 2 diabetes mellitus ,multiple falls, with recent admission at [facility] for fall, where he was discharged 12/2, apparently  patient fell again at home [place], apparently patient lives with his son, and daughter-in-law, he had a fall where he stated his legs were weak, does not appear to be associated with any loss of consciousness, dizziness or lightheadedness.    In ED workup was significant for right proximal tibial fracture, ED physician at [facility] discussed with Dr. Marcelino Scot, who accepted the patient to be admitted to [facility] for surgical intervention, patient transferred directly to the OR,  where he had surgical repair, he was then admitted to hospital service.   Right proximal tibial fracture  Recurrent falls -S/p surgical repair by Dr. Marcelino Scot 05/13/2022.  Eliquis resumed on 05/14/2022 after clearance from orthopedics.  Seen by PT OT, recommendations for SNF.  However orthopedics has recommended nonweightbearing in the right leg for 6 weeks and for that reason, insurance had declined SNF but then orthopedics did peer to peer and he is now approved so he is going to be discharged today.   Acute blood loss anemia: Hemoglobin preoperatively 9.5, dropped to as low as 7.1 but improved, without requiring any blood transfusion.  Hemoglobin has remained stable.   Parox A-fib -Patient with known history of paroxysmal A-fib, he is currently with paced rhythm -CHA2DS2-VASc score> 2, continue metoprolol.  Resumed Eliquis.   Hypertension: Blood pressure controlled.  Continue Toprol-XL.   Diabetes mellitus, type II -Does not appear to be on any medication, blood sugar controlled, he is not requiring any insulin, SSI discontinued.   Depression -Continue with home medication includes Celexa and Remeron   BPH -Continue with Proscar   Asthma, chronic -no active wheezing currently, continue with Singulair, and as needed albuterol   History of CAD -Continue with Toprol-XL, statin, he is on Eliquis, not on aspirin as he is on Eliquis.   Dyslipidemia -Continue with statin   Depression - We will continue as Celexa and Remeron   BPH (benign prostatic hyperplasia) - We will continue as Proscar   Asthma, chronic - We will continue his Singulair and place him on as needed DuoNebs.   Coronary artery disease - We will continue his Toprol-XL and statin therapy.   Dyslipidemia - We will continue statin therapy.  Dementia: Patient is typically alert and oriented to self only.  He is at baseline.  Discharge plan was discussed with patient and/or family member and they verbalized  understanding and agreed with it.  Discharge Diagnoses:  Principal Problem:   Right tibial fracture Active Problems:   Dyslipidemia   Type II diabetes mellitus (HCC)   Coronary artery disease   HTN (hypertension)   S/P CABG x 4   Chronic systolic CHF (congestive heart failure) (HCC)   AICD (automatic cardioverter/defibrillator) present   Paroxysmal atrial fibrillation (HCC)   Recurrent falls   BPH (benign prostatic hyperplasia)   Fall    Discharge Instructions   Allergies as of 05/20/2022       Reactions   Levofloxacin Rash   Ambien [zolpidem Tartrate] Other (See Comments)   Hallucinations and per pt he walked around at night and doesn't remember.   Aspirin Other (See Comments)   Hemorrhagic Stroke   Bee Venom Other (See Comments)   Reaction: fever   Chlorpheniramine Other (See Comments)   Reaction: unknown   Decongestant [pseudoephedrine Hcl] Other (See Comments)   Reaction: unknown   Simvastatin Other (See Comments)   Other reaction(s): Muscle Pain   Zolpidem Other (See Comments)   Other reaction(s): Unknown "went bananas"   Contrast Media [iodinated  Contrast Media] Rash        Medication List     STOP taking these medications    latanoprost 0.005 % ophthalmic solution Commonly known as: XALATAN       TAKE these medications    acetaminophen 325 MG tablet Commonly known as: TYLENOL Take 2 tablets (650 mg total) by mouth every 6 (six) hours as needed for mild pain (or Fever >/= 101).   amitriptyline 50 MG tablet Commonly known as: ELAVIL Take 50 mg by mouth at bedtime.   apixaban 5 MG Tabs tablet Commonly known as: ELIQUIS Take 5 mg by mouth 2 (two) times daily.   Calcium 600+D3 Plus Minerals 600-800 MG-UNIT Tabs Take 1 tablet by mouth every evening.   citalopram 20 MG tablet Commonly known as: CELEXA Take 20 mg by mouth every evening.   CoQ10 200 MG Caps Take 200 mg by mouth every evening.   finasteride 5 MG tablet Commonly known as:  PROSCAR Take 5 mg by mouth every evening.   lidocaine 5 % Commonly known as: LIDODERM Place 1 patch onto the skin daily. Remove & Discard patch within 12 hours or as directed by MD   metoprolol succinate 25 MG 24 hr tablet Commonly known as: TOPROL-XL Take 25 mg by mouth daily.   montelukast 10 MG tablet Commonly known as: SINGULAIR Take 10 mg by mouth at bedtime.   oxyCODONE 5 MG immediate release tablet Commonly known as: Oxy IR/ROXICODONE Take 0.5-1 tablets (2.5-5 mg total) by mouth every 4 (four) hours as needed for moderate pain or severe pain.   rosuvastatin 5 MG tablet Commonly known as: CRESTOR Take 5 mg by mouth every Monday, Wednesday, and Friday. (Evening)   traMADol 50 MG tablet Commonly known as: ULTRAM Take 1 tablet (50 mg total) by mouth every 12 (twelve) hours for 7 days. What changed:  when to take this reasons to take this        Contact information for follow-up providers     Altamese [place], MD. Schedule an appointment as soon as possible for a visit in 2 week(s).   Specialty: Orthopedic Surgery Contact information: [place] [address] [phone]         Derinda Late, MD Follow up in 1 week(s).   Specialty: Family Medicine Contact information: [address] [address] [phone]              Contact information for after-discharge care     Destination     [facility] Preferred SNF .   Service: Skilled Nursing Contact information: [address] [address] [phone]                    Allergies  Allergen Reactions   Levofloxacin Rash   Ambien [Zolpidem Tartrate] Other (See Comments)    Hallucinations and per pt he walked around at night and doesn't remember.   Aspirin Other (See Comments)    Hemorrhagic Stroke   Bee Venom Other (See Comments)    Reaction: fever   Chlorpheniramine Other (See Comments)    Reaction: unknown    Decongestant [Pseudoephedrine Hcl] Other (See Comments)    Reaction: unknown   Simvastatin Other (See Comments)    Other reaction(s): Muscle Pain   Zolpidem Other (See Comments)    Other reaction(s): Unknown "went bananas"   Contrast Media [Iodinated Contrast Media] Rash    Consultations: Orthopedics   Procedures/Studies: DG Knee Right  Port  Result Date: 05/13/2022 CLINICAL DATA:  Fracture tibia and fibula EXAM: PORTABLE RIGHT KNEE - 1-2 VIEW COMPARISON:  05/12/2022 FINDINGS: There is interval reduction and internal fixation of comminuted displaced fracture of proximal shaft of tibia with metallic plate and multiple surgical screws. There is improvement in alignment. There is essentially undisplaced fracture in the neck of right fibula. Arterial calcifications are seen in soft tissues. IMPRESSION: Interval reduction and internal fixation of severely comminuted fracture of proximal shaft of right tibia. Electronically Signed   By: Elmer Picker M.D.   On: 05/13/2022 20:47   DG Shoulder Left  Result Date: 05/13/2022 CLINICAL DATA:  Trauma, pain EXAM: LEFT SHOULDER - 2+ VIEW COMPARISON:  None Available. FINDINGS: No displaced fracture or dislocation is seen. Osteopenia is seen in bony structures. Degenerative changes are noted in left AC joint. Pacemaker/defibrillator battery is seen in left infraclavicular region. IMPRESSION: No recent fracture or dislocation is seen in left shoulder. Electronically Signed   By: Elmer Picker M.D.   On: 05/13/2022 20:41   DG Tibia/Fibula Right  Result Date: 05/13/2022 CLINICAL DATA:  Open reduction internal fixation right tibia fracture. Intraoperative fluoroscopy. EXAM: RIGHT TIBIA AND FIBULA - 2 VIEW COMPARISON:  Right knee radiographs 05/12/2022 FINDINGS: Images were performed intraoperatively without the presence of a radiologist. The patient is undergoing lateral plate and screw fixation of the previously seen angulated and mildly displaced  proximal tibial metaphyseal and diaphyseal fracture. There is improved alignment. Total fluoroscopy images: 7 Total fluoroscopy time: 30 seconds Total dose: Radiation Exposure Index (as provided by the fluoroscopic device): 2.75 mGy air Kerma Please see intraoperative findings for further detail. IMPRESSION: Intraoperative fluoroscopy provided for lateral plate and screw fixation of the proximal tibial fracture. Electronically Signed   By: Yvonne Kendall M.D.   On: 05/13/2022 17:49   DG C-Arm 1-60 Min-No Report  Result Date: 05/13/2022 Fluoroscopy was utilized by the requesting physician.  No radiographic interpretation.   CT KNEE RIGHT WO CONTRAST  Result Date: 05/13/2022 CLINICAL DATA:  Proximal tibial fracture. EXAM: CT OF THE RIGHT KNEE WITHOUT CONTRAST TECHNIQUE: Multidetector CT imaging of the right knee was performed according to the standard protocol. Multiplanar CT image reconstructions were also generated. RADIATION DOSE REDUCTION: This exam was performed according to the departmental dose-optimization program which includes automated exposure control, adjustment of the mA and/or kV according to patient size and/or use of iterative reconstruction technique. COMPARISON:  Radiograph performed May 12, 2022 FINDINGS: Bones/Joint/Cartilage There is a mildly displaced comminuted fracture of the proximal tibial metadiaphysis. There is a proximally 1.2 cm anterior displacement and medial angulation of the distal fracture fragment. There is intra-articular extension of the fracture with fracture line extends into the lateral tibial plateau. There is also mildly displaced fracture of the fibular head and neck. There is no appreciable joint effusion. Patella and distal femur appear intact. Ligaments Suboptimally assessed by CT. Muscles and Tendons Generalized muscle atrophy. No intramuscular hematoma or fluid collection. Soft tissues Subcutaneous soft tissue edema about anterior aspect of the tibia. No  fluid collection or hematoma. IMPRESSION: 1. Mildly displaced comminuted fracture of the proximal tibial metadiaphysis with intra-articular extension. 2. Mildly displaced fracture of the fibular head and neck. Electronically Signed   By: Keane Police D.O.   On: 05/13/2022 12:16   DG Knee 1-2 Views Right  Result Date: 05/12/2022 CLINICAL DATA:  Right knee pain after unwitnessed fall today. EXAM: RIGHT KNEE - 1-2 VIEW COMPARISON:  Right knee radiographs 05/08/2022 FINDINGS: There  is diffuse decreased bone mineralization. Mild motion artifact on the frontal and oblique views. There is a transverse fracture of the proximal tibial metadiaphysis with mild medial apex angulation. Up to 7 mm anterior cortical step-off of the tibial tubercle at the anterior aspect of the fracture. Otherwise, no significant displacement. Evaluation of the joint spaces is limited by obliquity of the views, however there appears to be moderate to severe lateral compartment, moderate medial compartment, and moderate patellofemoral compartment joint space narrowing. Mild chronic enthesopathic change at the quadriceps and patellar insertions on the patella. No knee joint effusion. Surgical clips overlie the posteromedial distal thigh and knee. Left knee surgical clips are also noted within the partially visualized left lower extremity. Moderate to high-grade atherosclerotic calcifications. IMPRESSION: 1. Acute transverse fracture of the proximal tibial metadiaphysis with mild medial apex angulation. 2. Tricompartmental osteoarthritis, moderate to severe within the lateral compartment. Electronically Signed   By: Yvonne Kendall M.D.   On: 05/12/2022 08:25   DG Chest Portable 1 View  Result Date: 05/12/2022 CLINICAL DATA:  Unwitnessed fall this morning. EXAM: PORTABLE CHEST 1 VIEW COMPARISON:  AP chest 04/01/2022, 02/23/2022 FINDINGS: Status post median sternotomy and CABG. Cardiac silhouette is again moderately enlarged. Mild-to-moderate  calcification within the aortic arch. Horizontal linear right midlung chronic scarring is unchanged from multiple prior radiographs. No new focal airspace opacity. No pulmonary edema, pleural effusion, pneumothorax. Mild dextrocurvature of the midthoracic spine with mild-to-moderate multilevel degenerative disc changes. IMPRESSION: 1. No acute cardiopulmonary process. 2. Cardiomegaly. 3. Chronic right midlung scarring. Electronically Signed   By: Yvonne Kendall M.D.   On: 05/12/2022 08:23   DG Hand Complete Left  Result Date: 05/12/2022 CLINICAL DATA:  Fall.  Left hand pain.  Laceration to left hand. EXAM: LEFT HAND - COMPLETE 3+ VIEW COMPARISON:  None Available. FINDINGS: There is diffuse decreased bone mineralization. Severe second through fourth greater than fifth DIP, severe thumb interphalangeal, moderate to severe second through fifth PIP, moderate to severe first and second metacarpophalangeal, and mild third through fifth metacarpophalangeal joint space narrowing and peripheral osteophytosis. Severe thumb carpometacarpal joint space narrowing, subchondral sclerosis, and peripheral osteophytosis. Severe triscaphe joint space narrowing. No acute fracture is seen.  No dislocation. IMPRESSION: 1. No acute fracture is seen. 2. Severe interphalangeal, thumb carpometacarpal, and triscaphe osteoarthritis. Electronically Signed   By: Yvonne Kendall M.D.   On: 05/12/2022 08:22   DG Hip Unilat W or Wo Pelvis 2-3 Views Right  Result Date: 05/12/2022 CLINICAL DATA:  Fall.  Right hip pain. EXAM: DG HIP (WITH OR WITHOUT PELVIS) 2-3V RIGHT COMPARISON:  Pelvis and right hip radiographs 03/28/2019 FINDINGS: There is again high-grade diffuse decreased bone mineralization. Within this limitation, no acute fracture is identified. Moderate to severe superior right femoroacetabular joint space narrowing with peripheral acetabular and femoral head-neck junction degenerative osteophytes. Interval healing of the acute fracture  previously seen within the right greater trochanter. Redemonstration of right proximal femoral compression screw. No perihardware lucency is seen to indicate hardware failure or loosening. Chronic medial displacement of partially fused right lesser trochanter fracture. Moderate superior left femoroacetabular joint space narrowing and peripheral osteophytosis. Mild bilateral sacroiliac subchondral sclerosis. The pubic symphysis joint space is maintained. Partial visualization of aorto bi-iliac vascular stents. IMPRESSION: 1. Within the limitations of diffuse decreased bone mineralization, no acute fracture is identified within the pelvis or right hip. 2. Moderate to severe right femoroacetabular osteoarthritis. 3. Moderate left femoroacetabular osteoarthritis. Electronically Signed   By: Viann Fish.D.  On: 05/12/2022 08:20   CT HEAD WO CONTRAST (5MM)  Result Date: 05/12/2022 CLINICAL DATA:  Fall EXAM: CT HEAD WITHOUT CONTRAST CT CERVICAL SPINE WITHOUT CONTRAST TECHNIQUE: Multidetector CT imaging of the head and cervical spine was performed following the standard protocol without intravenous contrast. Multiplanar CT image reconstructions of the cervical spine were also generated. RADIATION DOSE REDUCTION: This exam was performed according to the departmental dose-optimization program which includes automated exposure control, adjustment of the mA and/or kV according to patient size and/or use of iterative reconstruction technique. COMPARISON:  CT head and cervical spine 05/08/2022 FINDINGS: CT HEAD FINDINGS Brain: There is no acute intracranial hemorrhage, extra-axial fluid collection, or acute infarct. Parenchymal volume is stable. The ventricles are stable in size. Background chronic small-vessel ischemic change is stable. There is no mass lesion.  There is no mass effect or midline shift. Vascular: There is calcification of the bilateral carotid siphons and vertebral arteries. Skull: Normal. Negative for  fracture or focal lesion. Sinuses/Orbits: The imaged paranasal sinuses are clear. Bilateral lens implants are in place. The globes and orbits are otherwise unremarkable. Other: None. CT CERVICAL SPINE FINDINGS Alignment: There is no significant antero or retrolisthesis. There is no jumped or perched facet or other evidence of traumatic malalignment. Skull base and vertebrae: Skull base alignment is maintained. Vertebral body heights are preserved. There is no evidence of acute fracture. There is no suspicious osseous lesion. Soft tissues and spinal canal: No prevertebral fluid or swelling. No visible canal hematoma. Disc levels: There is disc space narrowing and degenerative endplate change most advanced at C5-C6 and C6-C7. Facet arthropathy is most advanced on the right at C4-C5. Upper chest: The lung apices are not imaged. Other: There is calcified plaque at the carotid bifurcations, left worse than right. IMPRESSION: 1. Stable noncontrast head CT with no acute intracranial pathology. 2. No acute fracture or traumatic malalignment of the cervical spine. Electronically Signed   By: Valetta Mole M.D.   On: 05/12/2022 07:56   CT Cervical Spine Wo Contrast  Result Date: 05/12/2022 CLINICAL DATA:  Fall EXAM: CT HEAD WITHOUT CONTRAST CT CERVICAL SPINE WITHOUT CONTRAST TECHNIQUE: Multidetector CT imaging of the head and cervical spine was performed following the standard protocol without intravenous contrast. Multiplanar CT image reconstructions of the cervical spine were also generated. RADIATION DOSE REDUCTION: This exam was performed according to the departmental dose-optimization program which includes automated exposure control, adjustment of the mA and/or kV according to patient size and/or use of iterative reconstruction technique. COMPARISON:  CT head and cervical spine 05/08/2022 FINDINGS: CT HEAD FINDINGS Brain: There is no acute intracranial hemorrhage, extra-axial fluid collection, or acute infarct.  Parenchymal volume is stable. The ventricles are stable in size. Background chronic small-vessel ischemic change is stable. There is no mass lesion.  There is no mass effect or midline shift. Vascular: There is calcification of the bilateral carotid siphons and vertebral arteries. Skull: Normal. Negative for fracture or focal lesion. Sinuses/Orbits: The imaged paranasal sinuses are clear. Bilateral lens implants are in place. The globes and orbits are otherwise unremarkable. Other: None. CT CERVICAL SPINE FINDINGS Alignment: There is no significant antero or retrolisthesis. There is no jumped or perched facet or other evidence of traumatic malalignment. Skull base and vertebrae: Skull base alignment is maintained. Vertebral body heights are preserved. There is no evidence of acute fracture. There is no suspicious osseous lesion. Soft tissues and spinal canal: No prevertebral fluid or swelling. No visible canal hematoma. Disc levels: There is disc  space narrowing and degenerative endplate change most advanced at C5-C6 and C6-C7. Facet arthropathy is most advanced on the right at C4-C5. Upper chest: The lung apices are not imaged. Other: There is calcified plaque at the carotid bifurcations, left worse than right. IMPRESSION: 1. Stable noncontrast head CT with no acute intracranial pathology. 2. No acute fracture or traumatic malalignment of the cervical spine. Electronically Signed   By: Valetta Mole M.D.   On: 05/12/2022 07:56   US Carotid Bilateral  Result Date: 05/10/2022 CLINICAL DATA:  Unsteady gait EXAM: BILATERAL CAROTID DUPLEX ULTRASOUND TECHNIQUE: Pearline Cables scale imaging, color Doppler and duplex ultrasound were performed of bilateral carotid and vertebral arteries in the neck. COMPARISON:  None Available. FINDINGS: Criteria: Quantification of carotid stenosis is based on velocity parameters that correlate the residual internal carotid diameter with NASCET-based stenosis levels, using the diameter of the  distal internal carotid lumen as the denominator for stenosis measurement. The following velocity measurements were obtained: RIGHT ICA: 117/29 cm/sec CCA: 79/02 cm/sec SYSTOLIC ICA/CCA RATIO:  1.8 ECA:  78 cm/sec LEFT ICA: 135/33 cm/sec CCA: 40/97 cm/sec SYSTOLIC ICA/CCA RATIO:  2.2 ECA:  117 cm/sec RIGHT CAROTID ARTERY: Trace heterogeneous atherosclerotic plaque in the proximal internal carotid artery. By peak systolic velocity criteria, the estimated stenosis is less than 50%. RIGHT VERTEBRAL ARTERY:  Patent with normal antegrade flow. LEFT CAROTID ARTERY: Heterogeneous atherosclerotic plaque in the proximal internal carotid artery. By peak systolic velocity criteria, the estimated stenosis is in the 50-69% range. LEFT VERTEBRAL ARTERY:  Patent with normal antegrade flow. IMPRESSION: 1. Mild (1-49%) stenosis proximal right internal carotid artery secondary to heterogenous atherosclerotic plaque. 2. Moderate (50-69%) stenosis proximal left internal carotid artery secondary to heterogenous atherosclerotic plaque. 3. Vertebral arteries are patent with normal antegrade flow. Electronically Signed   By: Jacqulynn Cadet M.D.   On: 05/10/2022 07:30   CT L-SPINE NO CHARGE  Result Date: 05/08/2022 CLINICAL DATA:  Multiple recent falls. EXAM: CT THORACIC AND LUMBAR SPINE WITHOUT CONTRAST TECHNIQUE: Multiplanar CT images of the thoracic and lumbar spine were reconstructed from contemporary CT of the Chest, Abdomen, and Pelvis. RADIATION DOSE REDUCTION: This exam was performed according to the departmental dose-optimization program which includes automated exposure control, adjustment of the mA and/or kV according to patient size and/or use of iterative reconstruction technique. CONTRAST:  None. COMPARISON:  CT abdomen pelvis dated February 23, 2022. FINDINGS: CT THORACIC SPINE FINDINGS Alignment: Dextroscoliosis.  No traumatic malalignment. Vertebrae: No acute fracture chronic T10 compression deformity is unchanged.  Paraspinal and other soft tissues: Please see separate CT chest, abdomen, and pelvis report from same day. Disc levels: Disc heights are relatively preserved. Scattered mild-to-moderate facet arthropathy. CT LUMBAR SPINE FINDINGS Segmentation: Unchanged partial sacralization of L5 on the left. Alignment: No traumatic malalignment. Vertebrae: No acute fracture. Chronic L1 through L5 superior endplate compression deformities are unchanged. Paraspinal and other soft tissues: Please see separate CT chest, abdomen, and pelvis report from same day. Disc levels: Multilevel disc degeneration and advanced facet arthropathy. IMPRESSION: 1. No acute osseous abnormality of the thoracic or lumbar spine. 2. Unchanged T10 and L1 through L5 chronic compression deformities. Electronically Signed   By: Titus Dubin M.D.   On: 05/08/2022 17:13   CT T-SPINE NO CHARGE  Result Date: 05/08/2022 CLINICAL DATA:  Multiple recent falls. EXAM: CT THORACIC AND LUMBAR SPINE WITHOUT CONTRAST TECHNIQUE: Multiplanar CT images of the thoracic and lumbar spine were reconstructed from contemporary CT of the Chest, Abdomen, and Pelvis. RADIATION DOSE REDUCTION:  This exam was performed according to the departmental dose-optimization program which includes automated exposure control, adjustment of the mA and/or kV according to patient size and/or use of iterative reconstruction technique. CONTRAST:  None. COMPARISON:  CT abdomen pelvis dated February 23, 2022. FINDINGS: CT THORACIC SPINE FINDINGS Alignment: Dextroscoliosis.  No traumatic malalignment. Vertebrae: No acute fracture chronic T10 compression deformity is unchanged. Paraspinal and other soft tissues: Please see separate CT chest, abdomen, and pelvis report from same day. Disc levels: Disc heights are relatively preserved. Scattered mild-to-moderate facet arthropathy. CT LUMBAR SPINE FINDINGS Segmentation: Unchanged partial sacralization of L5 on the left. Alignment: No traumatic  malalignment. Vertebrae: No acute fracture. Chronic L1 through L5 superior endplate compression deformities are unchanged. Paraspinal and other soft tissues: Please see separate CT chest, abdomen, and pelvis report from same day. Disc levels: Multilevel disc degeneration and advanced facet arthropathy. IMPRESSION: 1. No acute osseous abnormality of the thoracic or lumbar spine. 2. Unchanged T10 and L1 through L5 chronic compression deformities. Electronically Signed   By: Titus Dubin M.D.   On: 05/08/2022 17:13   DG Knee 2 Views Right  Result Date: 05/08/2022 CLINICAL DATA:  Multiple falls, right knee pain EXAM: RIGHT KNEE - 1-2 VIEW COMPARISON:  None Available. FINDINGS: Frontal and cross-table lateral views of the right knee are obtained on 2 images. The bones are osteopenic. No acute fracture, subluxation, or dislocation. There is mild 3 compartmental osteoarthritis. No joint effusion. Diffuse atherosclerosis. Soft tissues are otherwise unremarkable. IMPRESSION: 1. No acute bony abnormality. 2. Mild 3 compartmental osteoarthritis. 3. Osteopenia. Electronically Signed   By: Randa Ngo M.D.   On: 05/08/2022 17:11   CT CHEST ABDOMEN PELVIS WO CONTRAST  Result Date: 05/08/2022 CLINICAL DATA:  Several falls over last few days. EXAM: CT CHEST, ABDOMEN AND PELVIS WITHOUT CONTRAST TECHNIQUE: Multidetector CT imaging of the chest, abdomen and pelvis was performed following the standard protocol without IV contrast. RADIATION DOSE REDUCTION: This exam was performed according to the departmental dose-optimization program which includes automated exposure control, adjustment of the mA and/or kV according to patient size and/or use of iterative reconstruction technique. COMPARISON:  CT abdomen pelvis dated February 23, 2022. FINDINGS: CT CHEST FINDINGS Cardiovascular: Cardiomegaly. Prior CABG. No pericardial effusion. No thoracic aortic aneurysm. Coronary, aortic arch, and branch vessel atherosclerotic  vascular disease. Left chest wall AICD. Mediastinum/Nodes: No enlarged mediastinal, hilar, or axillary lymph nodes. Thyroid gland, trachea, and esophagus demonstrate no significant findings. Lungs/Pleura: Mild paraseptal emphysema. Increased subpleural reticulation at the lung bases. No focal consolidation, pleural effusion, or pneumothorax. Musculoskeletal: No acute or significant osseous findings. Multiple old bilateral rib fractures. CT ABDOMEN PELVIS FINDINGS Hepatobiliary: No hepatic injury or perihepatic hematoma. Gallbladder is unremarkable. No biliary dilatation. Pancreas: Unremarkable. No pancreatic ductal dilatation or surrounding inflammatory changes. Spleen: No splenic injury or perisplenic hematoma. Adrenals/Urinary Tract: Adrenal glands are unremarkable. Unchanged bilateral renal cysts. No follow-up imaging is recommended. No renal calculi or hydronephrosis. The bladder is unremarkable. Stomach/Bowel: Stomach is within normal limits. History of prior appendectomy. No evidence of bowel wall thickening, distention, or inflammatory changes. Left-sided colonic diverticulosis. Vascular/Lymphatic: Slightly increased infrarenal abdominal aortic aneurysm status post stent grafting, currently measuring 7.8 x 8.5 cm, previously 7.9 x 8.1 cm. Aortoiliac atherosclerotic vascular disease. No enlarged abdominal or pelvic lymph nodes. Reproductive: Prostate is unremarkable. Other: Trace free fluid in the pelvis, nonspecific. No pneumoperitoneum. Musculoskeletal: Multiple chronic thoracic and lumbar vertebral compression deformities are unchanged. Old healed right intertrochanteric femur fracture status post ORIF. IMPRESSION:  1. No acute traumatic injury in the chest, abdomen, or pelvis. 2. Slightly increased infrarenal abdominal aortic aneurysm status post stent grafting, currently measuring 7.8 x 8.5 cm, previously 7.9 x 8.1 cm. 3. Mild interstitial lung disease. 4. Aortic Atherosclerosis (ICD10-I70.0) and Emphysema  (ICD10-J43.9). Electronically Signed   By: Titus Dubin M.D.   On: 05/08/2022 17:06   CT Head Wo Contrast  Result Date: 05/08/2022 CLINICAL DATA:  Head trauma, minor (Age >= 65y); Neck trauma (Age >= 65y) EXAM: CT HEAD WITHOUT CONTRAST CT CERVICAL SPINE WITHOUT CONTRAST TECHNIQUE: Multidetector CT imaging of the head and cervical spine was performed following the standard protocol without intravenous contrast. Multiplanar CT image reconstructions of the cervical spine were also generated. RADIATION DOSE REDUCTION: This exam was performed according to the departmental dose-optimization program which includes automated exposure control, adjustment of the mA and/or kV according to patient size and/or use of iterative reconstruction technique. COMPARISON:  04/01/2022 FINDINGS: CT HEAD FINDINGS Brain: No evidence of acute infarction, hemorrhage, hydrocephalus, extra-axial collection or mass lesion/mass effect. Scattered low-density changes within the periventricular and subcortical white matter compatible with chronic microvascular ischemic change. Moderate diffuse cerebral volume loss. Vascular: Atherosclerotic calcifications involving the large vessels of the skull base. No unexpected hyperdense vessel. Skull: Normal. Negative for fracture or focal lesion. Sinuses/Orbits: No acute finding. Other: Negative for scalp hematoma. CT CERVICAL SPINE FINDINGS Alignment: Facet joints are aligned without dislocation or traumatic listhesis. Dens and lateral masses are aligned. Skull base and vertebrae: No acute fracture. No primary bone lesion or focal pathologic process. Soft tissues and spinal canal: No prevertebral fluid or swelling. No visible canal hematoma. Disc levels:  Multilevel cervical spondylosis, unchanged from prior. Upper chest: Negative. Other: Bilateral carotid atherosclerosis. IMPRESSION: 1. No acute intracranial abnormality. 2. No acute fracture or subluxation of the cervical spine. Electronically  Signed   By: Davina Poke D.O.   On: 05/08/2022 16:58   CT Cervical Spine Wo Contrast  Result Date: 05/08/2022 CLINICAL DATA:  Head trauma, minor (Age >= 65y); Neck trauma (Age >= 65y) EXAM: CT HEAD WITHOUT CONTRAST CT CERVICAL SPINE WITHOUT CONTRAST TECHNIQUE: Multidetector CT imaging of the head and cervical spine was performed following the standard protocol without intravenous contrast. Multiplanar CT image reconstructions of the cervical spine were also generated. RADIATION DOSE REDUCTION: This exam was performed according to the departmental dose-optimization program which includes automated exposure control, adjustment of the mA and/or kV according to patient size and/or use of iterative reconstruction technique. COMPARISON:  04/01/2022 FINDINGS: CT HEAD FINDINGS Brain: No evidence of acute infarction, hemorrhage, hydrocephalus, extra-axial collection or mass lesion/mass effect. Scattered low-density changes within the periventricular and subcortical white matter compatible with chronic microvascular ischemic change. Moderate diffuse cerebral volume loss. Vascular: Atherosclerotic calcifications involving the large vessels of the skull base. No unexpected hyperdense vessel. Skull: Normal. Negative for fracture or focal lesion. Sinuses/Orbits: No acute finding. Other: Negative for scalp hematoma. CT CERVICAL SPINE FINDINGS Alignment: Facet joints are aligned without dislocation or traumatic listhesis. Dens and lateral masses are aligned. Skull base and vertebrae: No acute fracture. No primary bone lesion or focal pathologic process. Soft tissues and spinal canal: No prevertebral fluid or swelling. No visible canal hematoma. Disc levels:  Multilevel cervical spondylosis, unchanged from prior. Upper chest: Negative. Other: Bilateral carotid atherosclerosis. IMPRESSION: 1. No acute intracranial abnormality. 2. No acute fracture or subluxation of the cervical spine. Electronically Signed   By: Davina Poke D.O.   On: 05/08/2022 16:58  Discharge Exam: Vitals:   05/20/22 0512 05/20/22 0700  BP: 137/63 134/88  Pulse:    Resp: 19   Temp: 98.3 F (36.8 C) 98.2 F (36.8 C)  SpO2: 100%    Vitals:   05/19/22 1730 05/19/22 2000 05/20/22 0512 05/20/22 0700  BP: (!) 148/51 (!) 140/62 137/63 134/88  Pulse: 92 83    Resp: 16 (!) 22 19   Temp: 98 F (36.7 C) 98.2 F (36.8 C) 98.3 F (36.8 C) 98.2 F (36.8 C)  TempSrc: Oral Oral Oral Oral  SpO2: 99%  100%   Weight:      Height:        General: Pt is alert, awake, not in acute distress Cardiovascular: RRR, S1/S2 +, no rubs, no gallops Respiratory: CTA bilaterally, no wheezing, no rhonchi Abdominal: Soft, NT, ND, bowel sounds + Extremities: no edema, no cyanosis    The results of significant diagnostics from this hospitalization (including imaging, microbiology, ancillary and laboratory) are listed below for reference.     Microbiology: No results found for this or any previous visit (from the past 240 hour(s)).   Labs: BNP (last 3 results) No results for input(s): "BNP" in the last 8760 hours. Basic Metabolic Panel: Recent Labs  Lab 05/15/22 0343 05/16/22 0456 05/17/22 0316 05/19/22 0432 05/20/22 0824  NA 134* 136 140 142 141  K 5.0 5.0 5.1 5.2* 4.8  CL 106 107 109 110 110  CO2 21* 22 22 21* 22  GLUCOSE 129* 81 114* 103* 96  BUN 24* 24* 23 22 28*  CREATININE 1.45* 1.30* 1.30* 1.28* 1.22  CALCIUM 8.7* 9.0 9.2 9.1 8.7*   Liver Function Tests: No results for input(s): "AST", "ALT", "ALKPHOS", "BILITOT", "PROT", "ALBUMIN" in the last 168 hours. No results for input(s): "LIPASE", "AMYLASE" in the last 168 hours. No results for input(s): "AMMONIA" in the last 168 hours. CBC: Recent Labs  Lab 05/15/22 0343 05/15/22 1423 05/16/22 0456 05/16/22 1703 05/19/22 0432 05/20/22 0824  WBC 13.1* 14.1* 11.0* 12.1* 15.8* 12.6*  NEUTROABS 11.0*  --   --   --  13.1* 9.8*  HGB 7.1* 7.8* 7.6* 8.1* 7.6* 8.0*  HCT 21.7*  23.2* 23.3* 25.7* 23.9* 23.8*  MCV 100.5* 100.9* 102.6* 105.3* 104.4* 102.6*  PLT 154 183 197 219 240 216   Cardiac Enzymes: No results for input(s): "CKTOTAL", "CKMB", "CKMBINDEX", "TROPONINI" in the last 168 hours. BNP: Invalid input(s): "POCBNP" CBG: Recent Labs  Lab 05/19/22 1214 05/19/22 1650 05/19/22 2044 05/20/22 0823 05/20/22 1152  GLUCAP 97 100* 98 83 79   D-Dimer No results for input(s): "DDIMER" in the last 72 hours. Hgb A1c No results for input(s): "HGBA1C" in the last 72 hours. Lipid Profile No results for input(s): "CHOL", "HDL", "LDLCALC", "TRIG", "CHOLHDL", "LDLDIRECT" in the last 72 hours. Thyroid function studies No results for input(s): "TSH", "T4TOTAL", "T3FREE", "THYROIDAB" in the last 72 hours.  Invalid input(s): "FREET3" Anemia work up No results for input(s): "VITAMINB12", "FOLATE", "FERRITIN", "TIBC", "IRON", "RETICCTPCT" in the last 72 hours. Urinalysis    Component Value Date/Time   COLORURINE YELLOW (A) 05/08/2022 1751   APPEARANCEUR CLEAR (A) 05/08/2022 1751   LABSPEC 1.015 05/08/2022 1751   PHURINE 5.0 05/08/2022 1751   GLUCOSEU NEGATIVE 05/08/2022 1751   HGBUR NEGATIVE 05/08/2022 1751   BILIRUBINUR NEGATIVE 05/08/2022 1751   KETONESUR NEGATIVE 05/08/2022 1751   PROTEINUR NEGATIVE 05/08/2022 1751   UROBILINOGEN 0.2 10/28/2011 0947   NITRITE NEGATIVE 05/08/2022 1751   LEUKOCYTESUR SMALL (A) 05/08/2022 1751  Sepsis Labs Recent Labs  Lab 05/16/22 0456 05/16/22 1703 05/19/22 0432 05/20/22 0824  WBC 11.0* 12.1* 15.8* 12.6*   Microbiology No results found for this or any previous visit (from the past 240 hour(s)).   Time coordinating discharge: Over 30 minutes  SIGNED:  Jari Pigg, PA-C [phone] (C) 05/20/2022, 12:24 PM  [facility] [place] [address] [phone] Domingo Sep (F)

## 2022-05-19 NOTE — Consult Note (Signed)
Consultation Note Date: 05/19/2022 at 1400  Patient Name: Nathaniel Woods  DOB: January 11, 1929  MRN: [id number]  Age / Sex: 88 y.o., male  PCP: Derinda Late, MD Referring Physician: Darliss Cheney, MD  Reason for Consultation: Establishing goals of care  HPI/Patient Profile: 88 y.o. male  with past medical history of CAD, Sys CHF, CVA, Afib (Eliquis), HTN, dyslipidema, DM2, and multiple falls admitted as a transfer from [facility] on [facility]/13/[facility]22 with need for ORIF s/p fall at home ORIF for right bicondylar tibial plateau fracture completed on 12/5.  PMT was consulted to discuss Nathaniel Woods in light of multiple hospitalizations.   Clinical Assessment and Goals of Care: I have reviewed medical records including EPIC notes, labs and imaging, assessed the patient and then met with patient and his son Nathaniel Woods at bedside to discuss diagnosis prognosis, [place], EOL wishes, disposition and options.  Patient was able to acknolwedge my presence but wuicly returned to sleep. He was more responsive to his son, who was able to ask and get clarity from patient that he is currently not in pain or distress.  I introduced Palliative Medicine as specialized medical care for people living with serious illness. It focuses on providing relief from the symptoms and stress of a serious illness. The goal is to improve quality of life for both the patient and the family.  We discussed a brief life review of the patient. Patient is married and has 3 sons. Nathaniel Woods is the only son that lives locally and is the most heavily involved and primary caregiver.   As far as functional and nutritional status Nathaniel Woods shares that at baseline patient was sleeping a lot through the day and would awaken for meals and toileting. He was able to walk with walker and standby assistance prior to the most recent fall.   We discussed patient's current illness and what it means in the  larger context of patient's on-going co-morbidities.  Natural disease trajectory discussed.  I attempted to elicit values and goals of care important to the patient. In the short term, Nathaniel Woods is concerned about patient's dehydration. He speaks of making every effort to ensure patient has adequate hydration - liquid IV, gatorade, and IVF from a hydration "bar" in [place]. He is concerned that hydration will continue to be a barrier to patient's recovery.  Advance directives, concepts specific to code status, artificial feeding and hydration, and rehospitalization were considered and discussed. Son wants to discuss with patient and his wife at a later time. He believes his father would want to be a DNR and would not want to live long term on a ventilator. However, he is hesitant to make any changes until he speak with his mother/patient's wife. We reviewed that in the event that patient's heart and lungs stops and he was to physically pass away then, as it stands right now, patient would have a code blue called, CPR performed, defibrillation (if needed), and use of mechanical ventilation would be used (again, if necessary). Nathaniel Woods was in agreement for full code  and full scope to remain.  Education offered regarding concept specific to human mortality and the limitations of medical interventions to prolong life when the body begins to fail to thrive. Nathaniel Woods endorses that his father would do well with better pain control and hydration. We discussed adjustments to medications that appear to be better managing patient's pain - tramadol, robaxin.   We also discussed multiple factors contributing to patient's overal; recent decline - surgery, age, previous MI/CABG/CHF, advanced age.   Family is facing treatment option decisions, advanced directive, and anticipatory care needs.    Discussed with patient/family the importance of continued conversation with family and the medical providers regarding overall plan of  care and treatment options, ensuring decisions are within the context of the patient's values and GOCs.    Hospice and Palliative Care services outpatient were explained and offered. Son accepting of outpatient palliative referral. Referral placed.   Questions and concerns were addressed. Nathaniel Woods was encouraged to call with questions or concerns.   Primary Decision Maker NEXT OF KIN  Physical Exam Constitutional:      General: He is not in acute distress. HENT:     Head: Normocephalic.     Mouth/Throat:     Mouth: Mucous membranes are dry.  Eyes:     Pupils: Pupils are equal, round, and reactive to light.  Cardiovascular:     Rate and Rhythm: Normal rate.     Comments: paced Pulmonary:     Effort: Pulmonary effort is normal.  Abdominal:     Palpations: Abdomen is soft.  Musculoskeletal:     Comments: Generalized weakness  Skin:    General: Skin is warm and dry.  Neurological:     Mental Status: He is alert.  Psychiatric:        Behavior: Behavior normal.     Palliative Assessment/Data:     Thank you for this consult. Palliative medicine will continue to follow and assist holistically.   Time Total: 120 minutes  Greater than 50%  of this time was spent counseling and coordinating care related to the above assessment and plan.  Signed by: Nathaniel Hawks, DNP, FNP-BC Palliative Medicine    Please contact Palliative Medicine Team phone at [phone] for questions and concerns.  For individual provider: See Shea Evans

## 2022-05-19 NOTE — Consult Note (Signed)
[employer] Nurse Consult Note: Patient is discharging to SNF today.  Reason for Consult: Patient with pressure injuries to right heel and sacrum.  Intact nonblanchable erythema.  Offloading pressure is key to preventing decline in these wounds and resolving the erythema.  Wound type:pressure, stage 1  nonblanchable erythema Pressure Injury POA: None noted. He was admitted for a fall at home.  Wound ESL:[id number] intact erythema Drainage (amount, consistency, odor) none Periwound: intact Dressing procedure/placement/frequency: Offload pressure to bony prominences by encouraging patient to turn every two hours and use pillows/positioning wedges to position off of sacrum.  Offload heels when in bed for extended periods  Will not follow at this time.  Please re-consult if needed.  Estrellita Ludwig MSN, RN, FNP-BC CWON Wound, Ostomy, Continence Nurse [facility] [phone] Pager [phone]

## 2022-05-19 NOTE — Progress Notes (Signed)
[facility] follow up  Pt seen again this afternoon along with pts son, [name], to discuss care and plan for SNF tomorrow  I observe the pt eating a sandwich, applesauce and an ensure.  He ate nearly his entire meal.  Pt much more engaged this afternoon than he as this am   Continue with current care   Even with air mattress bed in full extended position the pts feet are still hitting the footboard. Will have him transition back to a regular bed   Jari Pigg, PA-C [phone] (C) 05/19/2022, 3:11 PM  [facility] [place] [address] [phone] (F)      Patient ID: Nathaniel Woods, male   DOB: Apr 12, 1929, 88 y.o.   MRN: [id number]

## 2022-05-19 NOTE — TOC Progression Note (Signed)
Transition of Care [facility]) - Progression Note    Patient Details  Name: Nathaniel Woods MRN: [id number] Date of Birth: June 22, 1928  [facility]) CM/SW Contact  Joanne Chars, LCSW Phone Number: 05/19/2022, 1:30 PM  Clinical Narrative:   Per Ainsley Spinner PA, [facility] will approve SNF.  CSW has not received call from [facility] yet.   CSW confirmed with Helene Kelp that they can receive pt.  CSW then informed pt will need one more day, [facility] informed.      Expected Discharge Plan: [place] Barriers to Discharge: Continued Medical Work up, SNF Pending bed offer  Expected Discharge Plan and Services Expected Discharge Plan: [place] In-house Referral: Clinical Social Work   Post Acute Care Choice: [place] Living arrangements for the past 2 months: Single Family Home Expected Discharge Date: 05/20/22                                     Social Determinants of Health (SDOH) Interventions    Readmission Risk Interventions     No data to display

## 2022-05-19 NOTE — Progress Notes (Signed)
Orthopaedic Trauma Service Progress Note  Patient ID: Nathaniel Woods MRN: [id number] DOB/AGE: August 01, 1928 88 y.o.  Subjective:  Looks slightly uncomfortable this am  R knee flexed in bone foam   Awake and was able to have a good conversation with him    ROS As above  Objective:   VITALS:   Vitals:   05/18/22 0429 05/18/22 0814 05/19/22 0415 05/19/22 0813  BP: (!) 134/100 (!) 157/81 (!) 144/60 (!) 141/53  Pulse:  78  80  Resp: (!) '22  17 19  '$ Temp:  99.8 F (37.7 C) 99.1 F (37.3 C) 99.5 F (37.5 C)  TempSrc:  Oral  Oral  SpO2:  99% 99% 100%  Weight:      Height:        Estimated body mass index is 22.87 kg/m as calculated from the following:   Height as of this encounter: '6\' 3"'$  (1.905 m).   Weight as of this encounter: 83 kg.   Intake/Output      12/10 0701 12/11 0700 12/11 0701 12/12 0700   P.O.     Total Intake(mL/kg)     Urine (mL/kg/hr)     Total Output     Net            LABS  Results for orders placed or performed during the hospital encounter of 05/13/22 (from the past 24 hour(s))  Glucose, capillary     Status: Abnormal   Collection Time: 05/18/22 12:00 PM  Result Value Ref Range   Glucose-Capillary 58 (L) 70 - 99 mg/dL   Comment 1 Notify RN    Comment 2 Repeat Test   Glucose, capillary     Status: Abnormal   Collection Time: 05/18/22 12:09 PM  Result Value Ref Range   Glucose-Capillary 104 (H) 70 - 99 mg/dL  Glucose, capillary     Status: Abnormal   Collection Time: 05/18/22  8:11 PM  Result Value Ref Range   Glucose-Capillary 112 (H) 70 - 99 mg/dL  Basic metabolic panel     Status: Abnormal   Collection Time: 05/19/22  4:32 AM  Result Value Ref Range   Sodium 142 135 - 145 mmol/L   Potassium 5.2 (H) 3.5 - 5.1 mmol/L   Chloride 110 98 - 111 mmol/L   CO2 21 (L) 22 - 32 mmol/L   Glucose, Bld 103 (H) 70 - 99 mg/dL   BUN 22 8 - 23 mg/dL   Creatinine, Ser 1.28  (H) 0.61 - 1.24 mg/dL   Calcium 9.1 8.9 - 10.3 mg/dL   GFR, Estimated 53 (L) >60 mL/min   Anion gap 11 5 - 15  CBC with Differential/Platelet     Status: Abnormal   Collection Time: 05/19/22  4:32 AM  Result Value Ref Range   WBC 15.8 (H) 4.0 - 10.5 K/uL   RBC 2.29 (L) 4.22 - 5.81 MIL/uL   Hemoglobin 7.6 (L) 13.0 - 17.0 g/dL   HCT 23.9 (L) 39.0 - 52.0 %   MCV 104.4 (H) 80.0 - 100.0 fL   MCH 33.2 26.0 - 34.0 pg   MCHC 31.8 30.0 - 36.0 g/dL   RDW 15.5 11.5 - 15.5 %   Platelets 240 150 - 400 K/uL   nRBC 0.6 (H) 0.0 - 0.2 %   Neutrophils Relative % 83 %  Neutro Abs 13.1 (H) 1.7 - 7.7 K/uL   Lymphocytes Relative 7 %   Lymphs Abs 1.1 0.7 - 4.0 K/uL   Monocytes Relative 7 %   Monocytes Absolute 1.1 (H) 0.1 - 1.0 K/uL   Eosinophils Relative 2 %   Eosinophils Absolute 0.2 0.0 - 0.5 K/uL   Basophils Relative 0 %   Basophils Absolute 0.1 0.0 - 0.1 K/uL   Immature Granulocytes 1 %   Abs Immature Granulocytes 0.16 (H) 0.00 - 0.07 K/uL  Glucose, capillary     Status: None   Collection Time: 05/19/22  8:10 AM  Result Value Ref Range   Glucose-Capillary 80 70 - 99 mg/dL     PHYSICAL EXAM:   Gen: sitting up in bed, on air mattress Lungs: unlabored Ext:       Right Lower Extremity              Incisions stable             No signs of infection             Moderate swelling R leg             Ext warm              Moves toes w/o difficulty              DPN, SPN, TN sensation grossly intact             EHL, FHL, lesser toe motor intact             Ankle flexion, extension, inversion and eversion intact             No DCT              Compartments are soft             + pressure sore to R heel stable        Left Lower Extremity              L heel with red blanchable skin  Prevalon boot in place     Assessment/Plan: 6 Days Post-Op   Anti-infectives (From admission, onward)    Start     Dose/Rate Route Frequency Ordered Stop   05/13/22 2300  ceFAZolin (ANCEF) IVPB 2g/100 mL  premix        2 g 200 mL/hr over 30 Minutes Intravenous Every 8 hours 05/13/22 1927 05/14/22 1701   05/13/22 1442  ceFAZolin (ANCEF) 2-4 GM/100ML-% IVPB       Note to Pharmacy: Cameron Sprang M: cabinet override      05/13/22 1442 05/14/22 0244     .  POD/HD#: 84   88 y/o male s/p fall with R bicondylar tibial plateau fracture   -fall   - R bicondylar tibial plateau fracture (fragility fracture) s/p ORIF             will allow WBAT R leg    Hopeful that this will allow for more engagement with therapy              Unrestricted ROM R knee             Elevate heels off bed             Ice prn              PT/OT             Dressing changed today  Ok to leave wounds open to the air               Float heels, prevalon boots              Skin checks q shift   Do not let right knee rest in flexion   Pt needs to get to chair    PT- please teach HEP for R knee ROM- AROM, PROM. No ROM restrictions.  Quad sets, SLR, LAQ, SAQ, heel slides, stretching   Ankle theraband program, heel cord stretching, toe towel curls, etc   No pillows under bend of knee when at rest, ok to place under heel to help work on extension. Can also use zero knee bone foam if available   - R heel pressure sore             Float heels                         Prevalon boot or bone foam             Wound care consult placed             Air mattress             Turn q 2 and PRN    - Pain management:             Multimodal             Minimize narcotics  Will schedule tramadol to see if we can optimize pain control  Continue scheduled tylenol    - ABL anemia/Hemodynamics             transfuse 1 unit    - Medical issues              Per primary    Order placed for feeding assistance    Palliative care consult placed to help establish GOC/expectations    - DVT/PE prophylaxis:             Eliquis   - ID:              Periop abx completed    - Metabolic Bone Disease:              Vitamin d levels ok but fracture is a fragility fracture             Optimize nutrition               - Activity:             As above   - Impediments to fracture healing:             Age             Poor bone quality               - Dispo:             snf tomorrow   Follow up with OTS In 10-14 days     Jari Pigg, PA-C [phone] (C) 05/19/2022, 9:37 AM  [facility] [place] [phone] Jenetta [phone] (F)    After 5pm and on the weekends please log on to Amion, go to orthopaedics and the look under the [facility] Call for the provider(s) on call. You can also call our office at [phone] and then follow the prompts to be connected to the call  team.  Patient ID: Nathaniel Woods, male   DOB: [birth year], 88 y.o.   MRN: [id number]

## 2022-05-19 NOTE — Progress Notes (Signed)
Physical Therapy Treatment Patient Details Name: Nathaniel Woods MRN: [id number] DOB: 1928-06-22 Today's Date: 05/19/2022   History of Present Illness Pt is a 88 y/o male who presents from [facility] for ORIF of R tibial plateau fracture on 05/13/2022. Initially NWB however on 05/19/2022 Ainsley Spinner, PA-C changed WB status to WBAT. PMH significant for multiple recent admissions 2 falls, AAA, CKD III, AICD, CHF, CAD, HOH, HTN, MI, PAF, bradycardia, CVA, DM II, R hip fracture fixation 1992.    PT Comments    Pt seen in conjunction with OT for positioning in bed and addressing poor RLE alignment in bed. Bed length adjusted to allow for a more extended knee. Pt rolled R and L for linen change, peri care, and sacral foam dressing application. Pt positioned tilted to his L to off weight sacrum, with RLE in Prevalon boot, B heels floated to decrease pressure and promote knee extension, roll under R trunk, and padding between knees. PA present to assess pt with therapy. States pt may be WBAT, and we may utilize [employer] instead of bone foam at this time. Noted R heel draining in bone foam and foam dressing applied by PA during session. RN updated regarding bed adjustment, positioning, and weight bearing status change. Will continue to follow and progress as able per POC.    Recommendations for follow up therapy are one component of a multi-disciplinary discharge planning process, led by the attending physician.  Recommendations may be updated based on patient status, additional functional criteria and insurance authorization.  Follow Up Recommendations  Skilled nursing-short term rehab (<3 hours/day) Can patient physically be transported by private vehicle: No   Assistance Recommended at Discharge Frequent or constant Supervision/Assistance  Patient can return home with the following Help with stairs or ramp for entrance;Assist for transportation;Assistance with cooking/housework;Direct supervision/assist  for medications management;Two people to help with walking and/or transfers;Two people to help with bathing/dressing/bathroom;Direct supervision/assist for financial management;Assistance with feeding   Equipment Recommendations  Other (comment) (TBD by next venue of care)    Recommendations for Other Services       Precautions / Restrictions Precautions Precautions: Fall Precaution Booklet Issued: No Restrictions Weight Bearing Restrictions: Yes RLE Weight Bearing: Weight bearing as tolerated Other Position/Activity Restrictions: WB updated since evaluation     Mobility  Bed Mobility Overal bed mobility: Needs Assistance Bed Mobility: Rolling Rolling: Total assist, +2 for physical assistance         General bed mobility comments: Total assist to roll however once there pt holding himself on his side with railing.    Transfers                        Ambulation/Gait                   Stairs             Wheelchair Mobility    Modified Rankin (Stroke Patients Only)       Balance Overall balance assessment: History of Falls                                          Cognition Arousal/Alertness: Awake/alert (Interactive although eyes closed) Behavior During Therapy: Anxious Overall Cognitive Status: Impaired/Different from baseline Area of Impairment: Orientation, Attention, Memory, Following commands, Safety/judgement, Awareness, Problem solving  Orientation Level: Disoriented to, Time, Situation Current Attention Level: Focused Memory: Decreased short-term memory, Decreased recall of precautions Following Commands: Follows one step commands inconsistently Safety/Judgement: Decreased awareness of safety, Decreased awareness of deficits Awareness: Intellectual Problem Solving: Slow processing, Decreased initiation, Difficulty sequencing, Requires verbal cues, Requires tactile cues           Exercises      General Comments        Pertinent Vitals/Pain Pain Assessment Pain Assessment: Faces Faces Pain Scale: Hurts whole lot Pain Location: RLE with movement/repositioning Pain Descriptors / Indicators: Grimacing, Moaning, Operative site guarding Pain Intervention(s): Limited activity within patient's tolerance, Monitored during session, Repositioned    Home Living                          Prior Function            PT Goals (current goals can now be found in the care plan section) Acute Rehab PT Goals Patient Stated Goal: Decrease pain - pain control throughout hospital stay for comfort as well as agitation per son PT Goal Formulation: With patient/family Time For Goal Achievement: 05/28/22 Potential to Achieve Goals: Good Progress towards PT goals: Progressing toward goals    Frequency    Min 3X/week      PT Plan Current plan remains appropriate    Co-evaluation PT/OT/SLP Co-Evaluation/Treatment: Yes Reason for Co-Treatment: Complexity of the patient's impairments (multi-system involvement) PT goals addressed during session: Mobility/safety with mobility;Balance;Proper use of DME;Strengthening/ROM        AM-PAC PT "6 Clicks" Mobility   Outcome Measure  Help needed turning from your back to your side while in a flat bed without using bedrails?: Total Help needed moving from lying on your back to sitting on the side of a flat bed without using bedrails?: Total Help needed moving to and from a bed to a chair (including a wheelchair)?: Total Help needed standing up from a chair using your arms (e.g., wheelchair or bedside chair)?: Total Help needed to walk in hospital room?: Total Help needed climbing 3-5 steps with a railing? : Total 6 Click Score: 6    End of Session   Activity Tolerance: Patient limited by pain Patient left: in bed;with call bell/phone within reach;with bed alarm set;with SCD's reapplied;Other (comment) (Air bed  lengthened.) Nurse Communication: Mobility status;Other (comment);Weight bearing status;Precautions PT Visit Diagnosis: Other abnormalities of gait and mobility (R26.89);Difficulty in walking, not elsewhere classified (R26.2)     Time: 8453-6468 PT Time Calculation (min) (ACUTE ONLY): 44 min  Charges:  $Therapeutic Activity: 23-37 mins                     Rolinda Roan, PT, DPT Acute Rehabilitation Services Secure Chat Preferred Office: [phone]    Thelma Comp 05/19/2022, 3:04 PM

## 2022-05-19 NOTE — Plan of Care (Signed)
  Problem: Education: Goal: Knowledge of General Education information will improve Description: Including pain rating scale, medication(s)/side effects and non-pharmacologic comfort measures Outcome: Not Progressing   Problem: Health Behavior/Discharge Planning: Goal: Ability to manage health-related needs will improve Outcome: Not Progressing   Problem: Clinical Measurements: Goal: Ability to maintain clinical measurements within normal limits will improve Outcome: Not Progressing Goal: Will remain free from infection Outcome: Not Progressing Goal: Diagnostic test results will improve Outcome: Not Progressing Goal: Respiratory complications will improve Outcome: Not Progressing Goal: Cardiovascular complication will be avoided Outcome: Not Progressing   Problem: Skin Integrity: Goal: Risk for impaired skin integrity will decrease Outcome: Not Progressing   Problem: Education: Goal: Ability to describe self-care measures that may prevent or decrease complications (Diabetes Survival Skills Education) will improve Outcome: Not Progressing Goal: Individualized Educational Video(s) Outcome: Not Progressing   Problem: Nutritional: Goal: Maintenance of adequate nutrition will improve Outcome: Not Progressing Goal: Progress toward achieving an optimal weight will improve Outcome: Not Progressing   Problem: Skin Integrity: Goal: Risk for impaired skin integrity will decrease Outcome: Not Progressing   Problem: Tissue Perfusion: Goal: Adequacy of tissue perfusion will improve Outcome: Not Progressing

## 2022-05-19 NOTE — Progress Notes (Signed)
Occupational Therapy Treatment Patient Details Name: Nathaniel Woods MRN: [id number] DOB: 09-19-1928 Today's Date: 05/19/2022   History of present illness Pt is a 88 y/o male who presents from [facility] for ORIF of R tibial plateau fracture on 05/13/2022. Initially NWB however on 05/19/2022 Ainsley Spinner, PA-C changed WB status to WBAT. PMH significant for multiple recent admissions 2 falls, AAA, CKD III, AICD, CHF, CAD, HOH, HTN, MI, PAF, bradycardia, CVA, DM II, R hip fracture fixation 1992.   OT comments  Focus of session on bed mobility and positioning for comfort to decrease risk of pressure wounds and deformity. PA aware of pressure of bone foam on R heel, repositioned in prevalon boot and floated L heel with pillows. Pt placed on R side after linen change, pericare and sacral dressing to buttocks. Pt drank a sip of water with total assist. Pt with increased rigidity in all extremities, but was able to maintain side lying with min assist and use of bed rails.   Recommendations for follow up therapy are one component of a multi-disciplinary discharge planning process, led by the attending physician.  Recommendations may be updated based on patient status, additional functional criteria and insurance authorization.    Follow Up Recommendations  Skilled nursing-short term rehab (<3 hours/day)     Assistance Recommended at Discharge Frequent or constant Supervision/Assistance  Patient can return home with the following  Two people to help with walking and/or transfers;Two people to help with bathing/dressing/bathroom;Assistance with cooking/housework;Assistance with feeding;Direct supervision/assist for medications management;Direct supervision/assist for financial management;Assist for transportation;Help with stairs or ramp for entrance   Equipment Recommendations  Other (comment) (defer to next venue)    Recommendations for Other Services      Precautions / Restrictions  Precautions Precautions: Fall Precaution Booklet Issued: No Restrictions Weight Bearing Restrictions: Yes RLE Weight Bearing: Weight bearing as tolerated Other Position/Activity Restrictions: WB updated since evaluation       Mobility Bed Mobility Overal bed mobility: Needs Assistance Bed Mobility: Rolling Rolling: Total assist, +2 for physical assistance         General bed mobility comments: Total assist to roll however once there pt holding himself on his side with railing.    Transfers                         Balance                                           ADL either performed or assessed with clinical judgement   ADL                                         General ADL Comments: dependent in drinking and pericare    Extremity/Trunk Assessment Upper Extremity Assessment Upper Extremity Assessment: Defer to OT evaluation            Vision       Perception     Praxis      Cognition Arousal/Alertness: Awake/alert Behavior During Therapy: Anxious Overall Cognitive Status: Impaired/Different from baseline Area of Impairment: Orientation, Attention, Memory, Following commands, Safety/judgement, Awareness, Problem solving                 Orientation Level: Disoriented to, Time, Situation Current Attention Level:  Focused Memory: Decreased short-term memory, Decreased recall of precautions Following Commands: Follows one step commands inconsistently Safety/Judgement: Decreased awareness of safety, Decreased awareness of deficits Awareness: Intellectual Problem Solving: Slow processing, Decreased initiation, Difficulty sequencing, Requires verbal cues, Requires tactile cues          Exercises      Shoulder Instructions       General Comments      Pertinent Vitals/ Pain       Pain Assessment Pain Assessment: Faces Faces Pain Scale: Hurts whole lot Pain Location: RLE with  movement/repositioning, generalized Pain Descriptors / Indicators: Grimacing, Moaning, Operative site guarding Pain Intervention(s): Monitored during session, Repositioned, Premedicated before session  Home Living                                          Prior Functioning/Environment              Frequency  Min 2X/week        Progress Toward Goals  OT Goals(current goals can now be found in the care plan section)  Progress towards OT goals: Progressing toward goals  Acute Rehab OT Goals OT Goal Formulation: With patient/family Time For Goal Achievement: 05/28/22 Potential to Achieve Goals: [place] Discharge plan remains appropriate    Co-evaluation      Reason for Co-Treatment: Complexity of the patient's impairments (multi-system involvement) PT goals addressed during session: Mobility/safety with mobility;Balance;Proper use of DME;Strengthening/ROM        AM-PAC OT "6 Clicks" Daily Activity     Outcome Measure   Help from another person eating meals?: Total Help from another person taking care of personal grooming?: Total Help from another person toileting, which includes using toliet, bedpan, or urinal?: Total Help from another person bathing (including washing, rinsing, drying)?: Total Help from another person to put on and taking off regular upper body clothing?: Total Help from another person to put on and taking off regular lower body clothing?: Total 6 Click Score: 6    End of Session    OT Visit Diagnosis: Muscle weakness (generalized) (M62.81);Other symptoms and signs involving cognitive function;Pain;Repeated falls (R29.6)   Activity Tolerance Patient tolerated treatment well   Patient Left in bed;with call bell/phone within reach   Nurse Communication Mobility status;Other (comment) (aware of pressure area draining on heal and maserated buttocks)        Time: 5366-4403 OT Time Calculation (min): 44 min  Charges: OT  General Charges $OT Visit: 1 Visit OT Treatments $Therapeutic Activity: 8-22 mins  Cleta Alberts, OTR/L Acute Rehabilitation Services Office: [phone]   Malka So 05/19/2022, 2:16 PM

## 2022-05-20 DIAGNOSIS — R262 Difficulty in walking, not elsewhere classified: Secondary | ICD-10-CM | POA: Diagnosis not present

## 2022-05-20 DIAGNOSIS — M6281 Muscle weakness (generalized): Secondary | ICD-10-CM | POA: Diagnosis not present

## 2022-05-20 DIAGNOSIS — R402431 Glasgow coma scale score 3-8, in the field [EMT or ambulance]: Secondary | ICD-10-CM | POA: Diagnosis not present

## 2022-05-20 DIAGNOSIS — Z515 Encounter for palliative care: Secondary | ICD-10-CM | POA: Diagnosis not present

## 2022-05-20 DIAGNOSIS — R4182 Altered mental status, unspecified: Secondary | ICD-10-CM | POA: Diagnosis not present

## 2022-05-20 DIAGNOSIS — Z471 Aftercare following joint replacement surgery: Secondary | ICD-10-CM | POA: Diagnosis not present

## 2022-05-20 DIAGNOSIS — E785 Hyperlipidemia, unspecified: Secondary | ICD-10-CM | POA: Diagnosis not present

## 2022-05-20 DIAGNOSIS — Z95810 Presence of automatic (implantable) cardiac defibrillator: Secondary | ICD-10-CM | POA: Diagnosis not present

## 2022-05-20 DIAGNOSIS — E119 Type 2 diabetes mellitus without complications: Secondary | ICD-10-CM | POA: Diagnosis not present

## 2022-05-20 DIAGNOSIS — S82191S Other fracture of upper end of right tibia, sequela: Secondary | ICD-10-CM | POA: Diagnosis not present

## 2022-05-20 DIAGNOSIS — R531 Weakness: Secondary | ICD-10-CM | POA: Diagnosis not present

## 2022-05-20 DIAGNOSIS — I469 Cardiac arrest, cause unspecified: Secondary | ICD-10-CM | POA: Diagnosis not present

## 2022-05-20 DIAGNOSIS — Z7401 Bed confinement status: Secondary | ICD-10-CM | POA: Diagnosis not present

## 2022-05-20 DIAGNOSIS — E1159 Type 2 diabetes mellitus with other circulatory complications: Secondary | ICD-10-CM | POA: Diagnosis not present

## 2022-05-20 DIAGNOSIS — M6259 Muscle wasting and atrophy, not elsewhere classified, multiple sites: Secondary | ICD-10-CM | POA: Diagnosis not present

## 2022-05-20 DIAGNOSIS — R1312 Dysphagia, oropharyngeal phase: Secondary | ICD-10-CM | POA: Diagnosis not present

## 2022-05-20 DIAGNOSIS — R410 Disorientation, unspecified: Secondary | ICD-10-CM | POA: Diagnosis not present

## 2022-05-20 DIAGNOSIS — N400 Enlarged prostate without lower urinary tract symptoms: Secondary | ICD-10-CM | POA: Diagnosis not present

## 2022-05-20 DIAGNOSIS — I10 Essential (primary) hypertension: Secondary | ICD-10-CM

## 2022-05-20 DIAGNOSIS — S82009A Unspecified fracture of unspecified patella, initial encounter for closed fracture: Secondary | ICD-10-CM | POA: Diagnosis not present

## 2022-05-20 DIAGNOSIS — R296 Repeated falls: Secondary | ICD-10-CM | POA: Diagnosis not present

## 2022-05-20 DIAGNOSIS — I2510 Atherosclerotic heart disease of native coronary artery without angina pectoris: Secondary | ICD-10-CM | POA: Diagnosis not present

## 2022-05-20 DIAGNOSIS — R41841 Cognitive communication deficit: Secondary | ICD-10-CM | POA: Diagnosis not present

## 2022-05-20 DIAGNOSIS — S82201D Unspecified fracture of shaft of right tibia, subsequent encounter for closed fracture with routine healing: Secondary | ICD-10-CM | POA: Diagnosis not present

## 2022-05-20 DIAGNOSIS — R609 Edema, unspecified: Secondary | ICD-10-CM | POA: Diagnosis not present

## 2022-05-20 DIAGNOSIS — Z789 Other specified health status: Secondary | ICD-10-CM | POA: Diagnosis not present

## 2022-05-20 DIAGNOSIS — I480 Paroxysmal atrial fibrillation: Secondary | ICD-10-CM | POA: Diagnosis not present

## 2022-05-20 DIAGNOSIS — Z4789 Encounter for other orthopedic aftercare: Secondary | ICD-10-CM | POA: Diagnosis not present

## 2022-05-20 DIAGNOSIS — R2689 Other abnormalities of gait and mobility: Secondary | ICD-10-CM | POA: Diagnosis not present

## 2022-05-20 DIAGNOSIS — F0390 Unspecified dementia without behavioral disturbance: Secondary | ICD-10-CM | POA: Diagnosis not present

## 2022-05-20 DIAGNOSIS — S82201S Unspecified fracture of shaft of right tibia, sequela: Secondary | ICD-10-CM | POA: Diagnosis not present

## 2022-05-20 DIAGNOSIS — Z951 Presence of aortocoronary bypass graft: Secondary | ICD-10-CM | POA: Diagnosis not present

## 2022-05-20 DIAGNOSIS — I5022 Chronic systolic (congestive) heart failure: Secondary | ICD-10-CM | POA: Diagnosis not present

## 2022-05-20 LAB — CBC WITH DIFFERENTIAL/PLATELET
Abs Immature Granulocytes: 0.19 10*3/uL — ABNORMAL HIGH (ref 0.00–0.07)
Basophils Absolute: 0.1 10*3/uL (ref 0.0–0.1)
Basophils Relative: 1 %
Eosinophils Absolute: 0.4 10*3/uL (ref 0.0–0.5)
Eosinophils Relative: 3 %
HCT: 23.8 % — ABNORMAL LOW (ref 39.0–52.0)
Hemoglobin: 8 g/dL — ABNORMAL LOW (ref 13.0–17.0)
Immature Granulocytes: 2 %
Lymphocytes Relative: 8 %
Lymphs Abs: 1 10*3/uL (ref 0.7–4.0)
MCH: 34.5 pg — ABNORMAL HIGH (ref 26.0–34.0)
MCHC: 33.6 g/dL (ref 30.0–36.0)
MCV: 102.6 fL — ABNORMAL HIGH (ref 80.0–100.0)
Monocytes Absolute: 1.2 10*3/uL — ABNORMAL HIGH (ref 0.1–1.0)
Monocytes Relative: 9 %
Neutro Abs: 9.8 10*3/uL — ABNORMAL HIGH (ref 1.7–7.7)
Neutrophils Relative %: 77 %
Platelets: 216 10*3/uL (ref 150–400)
RBC: 2.32 MIL/uL — ABNORMAL LOW (ref 4.22–5.81)
RDW: 16.3 % — ABNORMAL HIGH (ref 11.5–15.5)
WBC: 12.6 10*3/uL — ABNORMAL HIGH (ref 4.0–10.5)
nRBC: 1 % — ABNORMAL HIGH (ref 0.0–0.2)

## 2022-05-20 LAB — BASIC METABOLIC PANEL
Anion gap: 9 (ref 5–15)
BUN: 28 mg/dL — ABNORMAL HIGH (ref 8–23)
CO2: 22 mmol/L (ref 22–32)
Calcium: 8.7 mg/dL — ABNORMAL LOW (ref 8.9–10.3)
Chloride: 110 mmol/L (ref 98–111)
Creatinine, Ser: 1.22 mg/dL (ref 0.61–1.24)
GFR, Estimated: 56 mL/min — ABNORMAL LOW (ref >60)
Glucose, Bld: 96 mg/dL (ref 70–99)
Potassium: 4.8 mmol/L (ref 3.5–5.1)
Sodium: 141 mmol/L (ref 135–145)

## 2022-05-20 LAB — GLUCOSE, CAPILLARY
Glucose-Capillary: 83 mg/dL (ref 70–99)
Glucose-Capillary: 79 mg/dL (ref 70–99)

## 2022-05-20 MED ORDER — OXYCODONE HCL 5 MG PO TABS: 2.5000 mg | ORAL_TABLET | ORAL | 0 refills | Status: AC | PRN

## 2022-05-20 NOTE — Progress Notes (Signed)
                                                     Palliative Care Progress Note, Assessment & Plan   Patient Name: Nathaniel Woods       Date: 05/20/2022 DOB: 09/13/1928  Age: 88 y.o. MRN#: [id number] Attending Physician: [place], MD Primary Care Physician: Derinda Late, MD Admit Date: 05/13/2022  Reason for Consultation/Follow-up: Establishing goals of care  Subjective: Patient is lying in bed. He acknowledges my presence when I speak to him by his ear with a loud voice. He denies pain. I offered to feed him breakfast and he declined. No family/friends present at bedside.   HPI: 88 y.o. male  with past medical history of CAD, Sys CHF, CVA, Afib (Eliquis), HTN, dyslipidema, DM2, and multiple falls admitted as a transfer from [facility] on 05/13/2022 with need for ORIF s/p fall at home.  12/5, ORIF for right bicondylar tibial plateau fracture.   PMT was consulted to discuss [place] in light of multiple hospitalizations.  Summary of counseling/coordination of care: After reviewing the patient's chart and assessing the patient at bedside, I attempted to engage patient in discussion of his plan of care. He denies pain but also denies engaging in conversation at this time.   Discharge summary is in place. Patient plans to d/c to [facility] with outpatient palliative services to follow.   PMT will remain available to patient should discharge change, patient/family request, or if patient's declines during hospitalization.   Physical Exam Constitutional:      General: He is not in acute distress.    Appearance: Normal appearance. He is not ill-appearing.  Cardiovascular:     Rate and Rhythm: Normal rate.  Pulmonary:     Effort: Pulmonary effort is normal.  Musculoskeletal:     Comments: Generalized weakness, pt declined to complete ROM/PE  Skin:     General: Skin is warm and dry.  Neurological:     Mental Status: He is alert.  Psychiatric:        Behavior: Behavior normal.             Palliative Assessment/Data: 40%    Total Time 25 minutes  Greater than 50%  of this time was spent counseling and coordinating care related to the above assessment and plan.  Thank you for allowing the Palliative Medicine Team to assist in the care of this patient.  Vassar Ilsa Iha, FNP-BC Palliative Medicine Team Team Phone # [phone]

## 2022-05-20 NOTE — TOC Transition Note (Addendum)
Transition of Care [facility]) - CM/SW Discharge Note   Patient Details  Name: Nathaniel Woods MRN: [id number] Date of Birth: 03-Apr-1929  [facility]) CM/SW Contact:  Joanne Chars, LCSW Phone Number: 05/20/2022, 1:11 PM   Clinical Narrative:   PT discharging to [place], room 306A.  RN call report to [phone].    Final next level of care: Skilled Nursing Facility Barriers to Discharge: Barriers Resolved   Patient Goals and CMS Choice   CMS Medicare.gov Compare Post Acute Care list provided to:: Patient Represenative (must comment) (son [name]) Choice offered to / list presented to : Adult Children  Discharge Placement              Patient chooses bed at:  [facility]) Patient to be transferred to facility by: [place] Name of family member notified: son [name] Patient and family notified of of transfer: 05/20/22  Discharge Plan and Services In-house Referral: Clinical Social Work   Post Acute Care Choice: [place]                               Social Determinants of Health (SDOH) Interventions     Readmission Risk Interventions     No data to display

## 2022-05-20 NOTE — TOC Progression Note (Signed)
Transition of Care [facility]) - Progression Note    Patient Details  Name: Nathaniel Woods MRN: [id number] Date of Birth: [birth year]  [facility] CM/SW Contact  Joanne Chars, LCSW Phone Number: 05/20/2022, 1:19 PM  Clinical Narrative:   Josem Kaufmann received from HTA: SNF 7 days: [id number], PTAR: [id number].      Expected Discharge Plan: [place] Barriers to Discharge: Barriers Resolved  Expected Discharge Plan and Services Expected Discharge Plan: [place] In-house Referral: Clinical Social Work   Post Acute Care Choice: [place] Living arrangements for the past 2 months: Single Family Home Expected Discharge Date: 05/20/22                                     Social Determinants of Health (SDOH) Interventions    Readmission Risk Interventions     No data to display

## 2022-05-20 NOTE — Progress Notes (Signed)
Patient was discharged from hospital service yesterday however orthopedics wanted to transfuse blood and keep him overnight.  Orthopedic had agreed to assume care of this patient as primary service from hospital service.  Hospitalist signed off.  Orthopedics to discharge patient back to SNF today.

## 2022-05-21 DIAGNOSIS — S82201D Unspecified fracture of shaft of right tibia, subsequent encounter for closed fracture with routine healing: Secondary | ICD-10-CM | POA: Diagnosis not present

## 2022-05-21 DIAGNOSIS — I480 Paroxysmal atrial fibrillation: Secondary | ICD-10-CM | POA: Diagnosis not present

## 2022-05-21 DIAGNOSIS — N400 Enlarged prostate without lower urinary tract symptoms: Secondary | ICD-10-CM | POA: Diagnosis not present

## 2022-05-21 DIAGNOSIS — E785 Hyperlipidemia, unspecified: Secondary | ICD-10-CM | POA: Diagnosis not present

## 2022-05-21 DIAGNOSIS — I5022 Chronic systolic (congestive) heart failure: Secondary | ICD-10-CM | POA: Diagnosis not present

## 2022-05-21 DIAGNOSIS — Z95810 Presence of automatic (implantable) cardiac defibrillator: Secondary | ICD-10-CM | POA: Diagnosis not present

## 2022-05-21 DIAGNOSIS — Z4789 Encounter for other orthopedic aftercare: Secondary | ICD-10-CM | POA: Diagnosis not present

## 2022-05-21 DIAGNOSIS — F0390 Unspecified dementia without behavioral disturbance: Secondary | ICD-10-CM | POA: Diagnosis not present

## 2022-05-21 DIAGNOSIS — I2510 Atherosclerotic heart disease of native coronary artery without angina pectoris: Secondary | ICD-10-CM | POA: Diagnosis not present

## 2022-05-21 DIAGNOSIS — R296 Repeated falls: Secondary | ICD-10-CM | POA: Diagnosis not present

## 2022-05-21 LAB — TYPE AND SCREEN
ABO/RH(D): A NEG
Antibody Screen: POSITIVE
Unit division: 0
Unit division: 0

## 2022-05-21 LAB — BPAM RBC
Blood Product Expiration Date: 202401022359
Blood Product Expiration Date: 202401022359
ISSUE DATE / TIME: 202312111413
Unit Type and Rh: 600
Unit Type and Rh: 600

## 2022-05-27 DIAGNOSIS — S82009A Unspecified fracture of unspecified patella, initial encounter for closed fracture: Secondary | ICD-10-CM | POA: Diagnosis not present

## 2022-05-27 DIAGNOSIS — R296 Repeated falls: Secondary | ICD-10-CM | POA: Diagnosis not present

## 2022-05-27 DIAGNOSIS — Z515 Encounter for palliative care: Secondary | ICD-10-CM | POA: Diagnosis not present

## 2022-05-27 MED FILL — Hydromorphone HCl Inj 2 MG/ML: INTRAMUSCULAR | Qty: 0.5 | Status: AC

## 2022-05-28 DIAGNOSIS — Z515 Encounter for palliative care: Secondary | ICD-10-CM | POA: Diagnosis not present

## 2022-05-28 DIAGNOSIS — R296 Repeated falls: Secondary | ICD-10-CM | POA: Diagnosis not present

## 2022-05-28 DIAGNOSIS — I480 Paroxysmal atrial fibrillation: Secondary | ICD-10-CM | POA: Diagnosis not present

## 2022-05-28 DIAGNOSIS — E785 Hyperlipidemia, unspecified: Secondary | ICD-10-CM | POA: Diagnosis not present

## 2022-05-28 DIAGNOSIS — N400 Enlarged prostate without lower urinary tract symptoms: Secondary | ICD-10-CM | POA: Diagnosis not present

## 2022-05-28 DIAGNOSIS — S82201D Unspecified fracture of shaft of right tibia, subsequent encounter for closed fracture with routine healing: Secondary | ICD-10-CM | POA: Diagnosis not present

## 2022-05-28 DIAGNOSIS — Z95810 Presence of automatic (implantable) cardiac defibrillator: Secondary | ICD-10-CM | POA: Diagnosis not present

## 2022-05-28 DIAGNOSIS — Z4789 Encounter for other orthopedic aftercare: Secondary | ICD-10-CM | POA: Diagnosis not present

## 2022-05-28 DIAGNOSIS — S82009A Unspecified fracture of unspecified patella, initial encounter for closed fracture: Secondary | ICD-10-CM | POA: Diagnosis not present

## 2022-05-28 DIAGNOSIS — I5022 Chronic systolic (congestive) heart failure: Secondary | ICD-10-CM | POA: Diagnosis not present

## 2022-05-28 DIAGNOSIS — F0390 Unspecified dementia without behavioral disturbance: Secondary | ICD-10-CM | POA: Diagnosis not present

## 2022-05-28 DIAGNOSIS — I2510 Atherosclerotic heart disease of native coronary artery without angina pectoris: Secondary | ICD-10-CM | POA: Diagnosis not present

## 2022-05-30 DIAGNOSIS — Z515 Encounter for palliative care: Secondary | ICD-10-CM | POA: Diagnosis not present

## 2022-05-30 DIAGNOSIS — R609 Edema, unspecified: Secondary | ICD-10-CM | POA: Diagnosis not present

## 2022-05-30 DIAGNOSIS — S82009A Unspecified fracture of unspecified patella, initial encounter for closed fracture: Secondary | ICD-10-CM | POA: Diagnosis not present

## 2022-06-09 DEATH — deceased
